# Patient Record
Sex: Male | Born: 1935 | Race: White | Hispanic: No | Smoking: Former smoker
Health system: Southern US, Community
[De-identification: ages and names within clinical notes are randomized; demographics above are authoritative.]

## PROBLEM LIST (undated history)

## (undated) DIAGNOSIS — T7840XA Allergy, unspecified, initial encounter: Secondary | ICD-10-CM

## (undated) DIAGNOSIS — E7800 Pure hypercholesterolemia, unspecified: Secondary | ICD-10-CM

## (undated) DIAGNOSIS — C801 Malignant (primary) neoplasm, unspecified: Secondary | ICD-10-CM

## (undated) DIAGNOSIS — C4490 Unspecified malignant neoplasm of skin, unspecified: Secondary | ICD-10-CM

## (undated) DIAGNOSIS — M1990 Unspecified osteoarthritis, unspecified site: Secondary | ICD-10-CM

## (undated) DIAGNOSIS — I10 Essential (primary) hypertension: Secondary | ICD-10-CM

## (undated) DIAGNOSIS — E119 Type 2 diabetes mellitus without complications: Secondary | ICD-10-CM

## (undated) HISTORY — DX: Unspecified malignant neoplasm of skin, unspecified: C44.90

## (undated) HISTORY — DX: Allergy, unspecified, initial encounter: T78.40XA

## (undated) HISTORY — DX: Essential (primary) hypertension: I10

## (undated) HISTORY — PX: ENUCLEATION: SHX628

---
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
## 2003-11-06 ENCOUNTER — Other Ambulatory Visit: Admission: RE | Admit: 2003-11-06 | Discharge: 2003-11-06 | Payer: Self-pay | Admitting: Dermatology

## 2005-02-23 ENCOUNTER — Other Ambulatory Visit: Admission: RE | Admit: 2005-02-23 | Discharge: 2005-02-23 | Payer: Self-pay | Admitting: Dermatology

## 2007-05-30 ENCOUNTER — Ambulatory Visit (HOSPITAL_COMMUNITY): Admission: RE | Admit: 2007-05-30 | Discharge: 2007-05-30 | Payer: Self-pay | Admitting: Internal Medicine

## 2007-05-30 ENCOUNTER — Ambulatory Visit: Payer: Self-pay | Admitting: Internal Medicine

## 2007-11-17 ENCOUNTER — Emergency Department (HOSPITAL_COMMUNITY): Admission: EM | Admit: 2007-11-17 | Discharge: 2007-11-17 | Payer: Self-pay | Admitting: Emergency Medicine

## 2007-11-19 ENCOUNTER — Emergency Department (HOSPITAL_COMMUNITY): Admission: EM | Admit: 2007-11-19 | Discharge: 2007-11-19 | Payer: Self-pay | Admitting: Emergency Medicine

## 2009-04-03 ENCOUNTER — Emergency Department (HOSPITAL_COMMUNITY): Admission: EM | Admit: 2009-04-03 | Discharge: 2009-04-03 | Payer: Self-pay | Admitting: Emergency Medicine

## 2010-09-28 NOTE — Op Note (Signed)
NAME:  Cody Palmer, Cody Palmer               ACCOUNT NO.:  [id number]   MEDICAL RECORD NO.:  [id number]          PATIENT TYPE:  AMB   LOCATION:  DAY                           FACILITY:  [facility]   PHYSICIAN:  R. Roetta Sessions, M.D. DATE OF BIRTH:  26-Jul-1932   DATE OF PROCEDURE:  05/30/2007  DATE OF DISCHARGE:                                PROCEDURE NOTE   OPERATION/PROCEDURE:  Screening ileocolonoscopy.   INDICATIONS FOR PROCEDURE:  This 74-year gentleman is devoid of any  lower GI tract symptoms, sent over at the courtesy Dr. Janna Arch for  cancer screening via colonoscopy.  Mr.  [name] has never had his lower  GI tract imaged.  There is no family history colorectal neoplasia.  Colonoscopy is now being done as screening maneuver.  Potential risks,  benefits and alternatives and limitations have been reviewed, questions  answered.  He is agreeable.  Documentation in the medical record.   PROCEDURE NOTE:  Oxygen saturation, blood pressure, pulse and  respirations are monitored throughout the entire procedure.  Conscious  sedation with  Versed 6 mg IV, Demerol 125 mg IV in divided doses.  Instrument was the Pentax video chip system.   FINDINGS:  Digital rectal exam revealed no abnormalities. Prep was  adequate.  Colonic mucosa was surveyed in the rectosigmoid junction  through the left, transverse, right colon to the appendiceal orifice,  ileocecal valve and cecum.  These structures well seen.   For the record, the terminal ileum _________ cm from this level.  Scope  was slowly cautiously withdrawn.  All previous mentioned mucosal  surfaces were again seen.  The patient was noted have left-sided  transverse diverticula; ___________  terminal ileum and mucosa appeared  entirely normal.  Scope was pulled down in the rectum where the rectal  mucosa including retroflex view of the anal verge demonstrated no  abnormalities.  The patient tolerated the procedure well.  Was reactive  after  endoscopy.   IMPRESSION:  Normal rectum, left side, transverse diverticula.  The rest  of the colonic appeared normal.  Terminal mucosa appeared normal.   RECOMMENDATIONS:  1. Diverticulosis literature provided Mr. Thackston.  2. Repeat colonoscopy in 10 years.      Jonathon Bellows, M.D.  Electronically Signed     RMR/MEDQ  D:  05/30/2007  T:  05/30/2007  Job:  [id number]   cc:   Melvyn Novas, MD  Fax: [phone]

## 2012-12-13 ENCOUNTER — Encounter: Payer: Self-pay | Admitting: Orthopedic Surgery

## 2012-12-13 ENCOUNTER — Ambulatory Visit (INDEPENDENT_AMBULATORY_CARE_PROVIDER_SITE_OTHER): Payer: Medicare Other | Admitting: Orthopedic Surgery

## 2012-12-13 ENCOUNTER — Ambulatory Visit (INDEPENDENT_AMBULATORY_CARE_PROVIDER_SITE_OTHER): Payer: Medicare Other

## 2012-12-13 VITALS — BP 141/75 | Ht 70.0 in | Wt 210.0 lb

## 2012-12-13 DIAGNOSIS — M25569 Pain in unspecified knee: Secondary | ICD-10-CM

## 2012-12-13 DIAGNOSIS — M25561 Pain in right knee: Secondary | ICD-10-CM

## 2012-12-13 DIAGNOSIS — M1710 Unilateral primary osteoarthritis, unspecified knee: Secondary | ICD-10-CM

## 2012-12-13 DIAGNOSIS — M179 Osteoarthritis of knee, unspecified: Secondary | ICD-10-CM | POA: Insufficient documentation

## 2012-12-13 NOTE — Patient Instructions (Addendum)
Wear brace for walking   Apply ice 3 x a day for 30 minutes   Use crutch as needed

## 2012-12-13 NOTE — Progress Notes (Signed)
Patient ID: Cody Palmer, male   DOB: 1932/11/26, 79 y.o.   MRN: [id number] Chief Complaint  Patient presents with  . Knee Pain    Right knee pain, no injury.    Patient history we have a 79 year old male with hypertension who presents with long-standing right knee pain but increasing right knee pain since Friday with progressive increase in dull aching 6/10 constant knee pain causing him to not be able to put weight on his leg. He uses a cane but his pain increases to the point where it's unbearable and has lost function he does report some numbness in that right lateral knee joint with swelling of the joint. He was on ibuprofen switch to Naprosyn did well until July 25. 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 His review of systems is negative for weight loss chest pain blurred vision shortness of breath heartburn frequency skin changes numbness nervousness first. He does complain of easy bleeding, seasonal allergies stiffness and instability of the joint.  History of hypertension  History of to eye surgery secondary to traumatic eye injury  Pharmacy [facility]  Physician Dr. Ocie Bob [facility]  Medications amlodipine 10 mg a day Losartan 100-25 mg a day Labetalol 200 mg twice a day Crestor 10 mg a day Aspirin 81 mg a day Naproxen 500 mg twice a day  Family history arthritis cancer and diabetes  Social history he is currently widowed Smoking history he does not smoke alcohol use none Street drug use none  BP 141/75  Ht 5\' 10"  (1.778 m)  Wt 210 lb (95.255 kg)  BMI 30.13 kg/m2 General appearance is normal, the patient is alert and oriented x3 with normal mood and affect. The patient is ambulating with a crutch right hand severe limp severe difficulty weightbearing  Nutritional status is normal body habitus is large grooming is excellent  Peripheral vascular system no swelling no pitting edema pulses are normal temperature is normal no tenderness  Lymphatic  system groin nodes negative  Upper extremity exam  The right and left upper extremity:   Inspection and palpation revealed no abnormalities in the upper extremities.   Range of motion is full without contracture.  Motor exam is normal with grade 5 strength.  The joints are fully reduced without subluxation.  There is no atrophy or tremor and muscle tone is normal.  All joints are stable.  Left lower extremity is in varus very minimal joint line tenderness however. Flexion 115 stability normal mild flexion contracture strength normal muscle tone normal  Right knee varus lateral tenderness just over the fibula iliotibial band nontender painful crepitance no pain with varus and valgus stress no laxity muscle strength and tone normal no medial joint line tenderness  Skin all 4 extremities normal sensory exam normal orientation times person place normal mood and affect normal coordination test cannot be performed reflexes are equal stretch test negative  X-rays severe varus arthritis  I think he has some tendinitis/bursitis on the lateral side of his knee I injected him and put him in a hinged knee brace this may be stretching lateral compartment secondary to severe varus and thrust  Recommend recheck in a week to determine if he needs knee replacement  Procedure note inject the bursal right lateral knee Medications Depo-Medrol 40 mg once cc 1% lidocaine 3 cc Alcohol Ethyl chloride  Details: After verbal consent and timeout to confirm site alcohol and ethyl chloride were used to prepare the skin and then, the lateral  portion of the knee was injected with the above combination of medications. No complications

## 2012-12-20 ENCOUNTER — Ambulatory Visit (INDEPENDENT_AMBULATORY_CARE_PROVIDER_SITE_OTHER): Payer: Medicare Other | Admitting: Orthopedic Surgery

## 2012-12-20 ENCOUNTER — Encounter: Payer: Self-pay | Admitting: Orthopedic Surgery

## 2012-12-20 VITALS — BP 124/61 | Ht 70.0 in | Wt 210.0 lb

## 2012-12-20 DIAGNOSIS — M1710 Unilateral primary osteoarthritis, unspecified knee: Secondary | ICD-10-CM

## 2012-12-20 NOTE — Progress Notes (Signed)
Patient ID: Cody Palmer, male   DOB: 1933-03-11, 79 y.o.   MRN: [id number] Chief Complaint  Patient presents with  . Follow-up    1 week follow up s/p injection    80 year old male with lateral knee pain status post injection of the lateral collateral ligament insertion and iliotibial band. The hasn't changed much he may be a little bit better he is ambulating with a hinged knee brace and a crutch  I watched him walk without the crutch he has a severe varus thrust and a varus knee. His knee joint really doesn't hurt that much his main problem is that he can't walk well and he can't get back to his normal activities  Denies radicular symptoms no catching locking or giving way  General appearance is normal, the patient is alert and oriented x3 with normal mood and affect. BP 124/61  Ht 5\' 10"  (1.778 m)  Wt 210 lb (95.255 kg)  BMI 30.13 kg/m2 Again ambulation show severe varus thrust in the has a severe varus knee with competent competent lateral collateral ligament medial joint line tenderness no effusion normal strength normal skin  He would like to go to the total joint class before making a decision. He is a medical appointment next week so he does preop appointment done at that time  Call back to confirm right total knee arthroplasty

## 2012-12-24 ENCOUNTER — Ambulatory Visit: Payer: Medicare Other | Admitting: Orthopedic Surgery

## 2012-12-25 ENCOUNTER — Telehealth: Payer: Self-pay | Admitting: Orthopedic Surgery

## 2012-12-25 ENCOUNTER — Encounter: Payer: Self-pay | Admitting: *Deleted

## 2012-12-25 NOTE — Telephone Encounter (Signed)
Called and faxed notes to primary care, Dr. Janna Arch, fax# [phone], ph# [phone] - included request for medical clearance; patient and daughter Cody Palmer aware of status.  Patient's daughter relates patient had seen Dr. Janna Arch, and was told verbally "okay for surgery."  Would like to have surgery as soon as possible, 12/31/12? If available.  Patient ph# [phone] [facility]), daughter work # [phone].

## 2012-12-27 ENCOUNTER — Telehealth: Payer: Self-pay | Admitting: Orthopedic Surgery

## 2012-12-27 NOTE — Telephone Encounter (Signed)
Cody Palmer at Dr. Otilio Saber office said Cody Palmer has an appointment next Wednesday to see Dr. Janna Arch.  She will fax over surgery information after this visit.

## 2013-01-03 ENCOUNTER — Telehealth: Payer: Self-pay | Admitting: Orthopedic Surgery

## 2013-01-03 ENCOUNTER — Other Ambulatory Visit: Payer: Self-pay | Admitting: *Deleted

## 2013-01-03 NOTE — Telephone Encounter (Signed)
Regarding in-patient/admit surgery scheduled 01/11/13, CPT [phone], ICD9 code 715.96, 715.16 - Contacted insurer [employer] by phone, [phone], reached automated voice message indicating to: Fax# [phone] for submission of clinicals to nurse reviewer, pre-authorization department

## 2013-01-04 NOTE — Telephone Encounter (Addendum)
01/04/13 Call received back from [facility] Medicare/[employer], in response to my phone message and fax, acknowledged receipt; case pending nurse review.  We will be further notified via fax and/or phone of pre-authorization.  * Received pre-authorization following this call, per Jonathon Bellows, #[id number], for up to 7 day stay, for surgery noted (CPT [phone]).  If additional day(s) needed, clinicals would be required from hospital.

## 2013-01-07 ENCOUNTER — Encounter (HOSPITAL_COMMUNITY): Payer: Self-pay

## 2013-01-07 ENCOUNTER — Encounter (HOSPITAL_COMMUNITY): Payer: Self-pay | Admitting: Pharmacy Technician

## 2013-01-07 ENCOUNTER — Encounter (HOSPITAL_COMMUNITY)
Admission: RE | Admit: 2013-01-07 | Discharge: 2013-01-07 | Disposition: A | Discharge status: Home / Self Care | Payer: Medicare Other | Source: Ambulatory Visit | Attending: Orthopedic Surgery | Admitting: Orthopedic Surgery

## 2013-01-07 DIAGNOSIS — Z01812 Encounter for preprocedural laboratory examination: Secondary | ICD-10-CM | POA: Insufficient documentation

## 2013-01-07 DIAGNOSIS — Z01810 Encounter for preprocedural cardiovascular examination: Secondary | ICD-10-CM | POA: Insufficient documentation

## 2013-01-07 HISTORY — DX: Unspecified osteoarthritis, unspecified site: M19.90

## 2013-01-07 HISTORY — DX: Pure hypercholesterolemia, unspecified: E78.00

## 2013-01-07 LAB — BASIC METABOLIC PANEL
CO2: 32 mEq/L (ref 19–32)
Calcium: 10.6 mg/dL — ABNORMAL HIGH (ref 8.4–10.5)
Chloride: 100 mEq/L (ref 96–112)
GFR calc Af Amer: 70 mL/min — ABNORMAL LOW (ref >90)
Sodium: 140 mEq/L (ref 135–145)

## 2013-01-07 LAB — ABO/RH: ABO/RH(D): O POS

## 2013-01-07 LAB — CBC WITH DIFFERENTIAL/PLATELET
Basophils Absolute: 0 10*3/uL (ref 0.0–0.1)
Basophils Relative: 0 % (ref 0–1)
Lymphocytes Relative: 15 % (ref 12–46)
Neutro Abs: 7.3 10*3/uL (ref 1.7–7.7)
Platelets: 251 10*3/uL (ref 150–400)
RDW: 13.7 % (ref 11.5–15.5)
WBC: 10.2 10*3/uL (ref 4.0–10.5)

## 2013-01-07 LAB — SURGICAL PCR SCREEN: MRSA, PCR: NEGATIVE

## 2013-01-07 LAB — PREPARE RBC (CROSSMATCH)

## 2013-01-07 NOTE — Patient Instructions (Addendum)
Cody Palmer  01/07/2013   Your procedure is scheduled on:   01/11/2013   Report to [facility] at  615  AM.  Call this number if you have problems the morning of surgery: [phone]   Remember:   Do not eat food or drink liquids after midnight.   Take these medicines the morning of surgery with A SIP OF WATER: norvasc, labetolol, hyzaar   Do not wear jewelry, make-up or nail polish.  Do not wear lotions, powders, or perfumes.   Do not shave 48 hours prior to surgery. Men may shave face and neck.  Do not bring valuables to the hospital.  [facility] is not responsible  for any belongings or valuables.  Contacts, dentures or bridgework may not be worn into surgery.  Leave suitcase in the car. After surgery it may be brought to your room.  For patients admitted to the hospital, checkout time is 11:00 AM the day of discharge.   Patients discharged the day of surgery will not be allowed to drive home.  Name and phone number of your driver: family  Special Instructions: Shower using CHG 2 nights before surgery and the night before surgery.  If you shower the day of surgery use CHG.  Use special wash - you have one bottle of CHG for all showers.  You should use approximately 1/3 of the bottle for each shower.   Please read over the following fact sheets that you were given: Pain Booklet, Coughing and Deep Breathing, Surgical Site Infection Prevention, Anesthesia Post-op Instructions and Care and Recovery After Surgery Total Knee Replacement Total knee replacement is a procedure to replace your knee joint with an artificial knee joint (prosthetic knee joint). The purpose of this surgery is to reduce pain and improve your knee function. LET YOUR CAREGIVER KNOW ABOUT:   Any allergies you have.  Any medicines you are taking, including vitamins, herbs, eyedrops, over-the-counter medicines, and creams.  Any problems you have had with the use of anesthetics.  Family history of problems  with the use of anesthetics.  Any blood disorders you have, including bleeding problems or clotting problems.  Previous surgeries you have had. RISKS AND COMPLICATIONS  Generally, total knee replacement is a safe procedure. However, as with any surgical procedure, complications can occur. Possible complications associated with total knee replacement include:  Loss of range of motion of the knee or instability.  Loosening of the prosthesis.  Infection.  Persistent pain. BEFORE THE PROCEDURE   Your caregiver will instruct you when you need to stop eating and drinking.  Ask your caregiver if you need to change or stop any regular medicines. PROCEDURE  Just before the procedure you will receive medicine that will make you drowsy (sedative). This will be given through a tube that is inserted into one of your veins (intravenous [IV] tube). Then you will either receive medicine to block pain from the waist down through your legs (spinal block) or medicine to also receive medicine to make you fall asleep (general anesthetic). You may also receive medicine to block feeling in your leg (nerve block) to help ease pain after surgery. An incision will be made in your knee. Your surgeon will take out any damaged cartilage and bone by sawing off the damaged surfaces. Then the surgeon will put a new metal liner over the sawed off portion of your thigh bone (femur) and a plastic liner over the sawed off portion of one of the  bones of your lower leg (tibia). This is to restore alignment and function to your knee. A plastic piece is often used to restore the surface of your knee cap. AFTER THE PROCEDURE  You will be taken to the recovery area. You may have drainage tubes to drain excess fluid from your knee. These tubes attach to a device that removes these fluids. Once you are awake, stable, and taking fluids well, you will be taken to your hospital room. You will receive physical therapy as prescribed by your  caregiver. The length of your stay in the hospital after a knee replacement is 2 4 days. Your surgeon may recommend that you spend time (usually an additional 10 14 days) in an extended-care facility to help you begin walking again and improve your range of motion before you go home. You may also be prescribed blood-thinning medicine to decrease your risk of developing blood clots in your leg. Document Released: 08/08/2000 Document Revised: 11/01/2011 Document Reviewed: 06/12/2011 [facility] Patient Information 2014 [place], [place]. PATIENT INSTRUCTIONS POST-ANESTHESIA  IMMEDIATELY FOLLOWING SURGERY:  Do not drive or operate machinery for the first twenty four hours after surgery.  Do not make any important decisions for twenty four hours after surgery or while taking narcotic pain medications or sedatives.  If you develop intractable nausea and vomiting or a severe headache please notify your doctor immediately.  FOLLOW-UP:  Please make an appointment with your surgeon as instructed. You do not need to follow up with anesthesia unless specifically instructed to do so.  WOUND CARE INSTRUCTIONS (if applicable):  Keep a dry clean dressing on the anesthesia/puncture wound site if there is drainage.  Once the wound has quit draining you may leave it open to air.  Generally you should leave the bandage intact for twenty four hours unless there is drainage.  If the epidural site drains for more than 36-48 hours please call the anesthesia department.  QUESTIONS?:  Please feel free to call your physician or the hospital operator if you have any questions, and they will be happy to assist you.

## 2013-01-10 NOTE — H&P (Signed)
  Chief Complaint   Patient presents with   .  Knee Pain       Right knee pain, no injury.     Patient history we have a 79 year old male with hypertension who presents with long-standing right knee pain but increasing right knee pain since Friday with progressive increase in dull aching 6/10 constant knee pain causing him to not be able to put weight on his leg. He uses a cane but his pain increases to the point where it's unbearable and has lost function he does report some numbness in that right lateral knee joint with swelling of the joint. He was on ibuprofen switch to Naprosyn did well until July 25. 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 His review of systems is negative for weight loss chest pain blurred vision shortness of breath heartburn frequency skin changes numbness nervousness first. He does complain of easy bleeding, seasonal allergies stiffness and instability of the joint.  We did give him a cortisone injection and he still continued to have symptoms and want to proceed with knee replacement surgery. Informed consent process involved an explanation of the risks and benefits of this procedure and alternative treatments including nonoperative care   History of hypertension  History of to eye surgery secondary to traumatic eye injury  Pharmacy [facility] pharmacy  Physician Dr. Ocie Bob [facility]  Medications amlodipine 10 mg a day Losartan 100-25 mg a day Labetalol 200 mg twice a day Crestor 10 mg a day Aspirin 81 mg a day Naproxen 500 mg twice a day  Family history arthritis cancer and diabetes  Social history he is currently widowed Smoking history he does not smoke alcohol use none Street drug use none  BP 141/75  Ht 5\' 10"  (1.778 m)  Wt 210 lb (95.255 kg)  BMI 30.13 kg/m2 General appearance is normal, the patient is alert and oriented x3 with normal mood and affect. The patient is ambulating with a crutch right hand severe limp severe difficulty  weightbearing  Nutritional status is normal body habitus is large grooming is excellent  Peripheral vascular system no swelling no pitting edema pulses are normal temperature is normal no tenderness  Lymphatic system groin nodes negative  Upper extremity exam  The right and left upper extremity:     Inspection and palpation revealed no abnormalities in the upper extremities.    Range of motion is full without contracture.  Motor exam is normal with grade 5 strength.  The joints are fully reduced without subluxation.  There is no atrophy or tremor and muscle tone is normal.  All joints are stable.  Left lower extremity is in varus very minimal joint line tenderness however. Flexion 115 stability normal mild flexion contracture strength normal muscle tone normal  Right knee varus lateral tenderness just over the fibula iliotibial band nontender painful crepitance no pain with varus and valgus stress no laxity muscle strength and tone normal no medial joint line tenderness  Skin all 4 extremities normal sensory exam normal orientation times person place normal mood and affect normal coordination test cannot be performed reflexes are equal stretch test negative  X-rays severe varus arthritis  Osteoarthritis right knee  Right total knee

## 2013-01-11 ENCOUNTER — Encounter (HOSPITAL_COMMUNITY): Payer: Self-pay | Admitting: Anesthesiology

## 2013-01-11 ENCOUNTER — Encounter (HOSPITAL_COMMUNITY)
Admission: RE | Disposition: A | Discharge status: Home Health | Payer: Self-pay | Source: Ambulatory Visit | Attending: Orthopedic Surgery

## 2013-01-11 ENCOUNTER — Inpatient Hospital Stay (HOSPITAL_COMMUNITY): Payer: Medicare Other | Admitting: Anesthesiology

## 2013-01-11 ENCOUNTER — Encounter (HOSPITAL_COMMUNITY): Payer: Self-pay | Admitting: *Deleted

## 2013-01-11 ENCOUNTER — Inpatient Hospital Stay (HOSPITAL_COMMUNITY): Payer: Medicare Other

## 2013-01-11 ENCOUNTER — Inpatient Hospital Stay (HOSPITAL_COMMUNITY)
Admission: RE | Admit: 2013-01-11 | Discharge: 2013-01-14 | DRG: 470 | Disposition: A | Discharge status: Home Health | Payer: Medicare Other | Source: Ambulatory Visit | Attending: Orthopedic Surgery | Admitting: Orthopedic Surgery

## 2013-01-11 DIAGNOSIS — I10 Essential (primary) hypertension: Secondary | ICD-10-CM | POA: Diagnosis present

## 2013-01-11 DIAGNOSIS — Z833 Family history of diabetes mellitus: Secondary | ICD-10-CM

## 2013-01-11 DIAGNOSIS — M1710 Unilateral primary osteoarthritis, unspecified knee: Principal | ICD-10-CM | POA: Diagnosis present

## 2013-01-11 HISTORY — PX: TOTAL KNEE ARTHROPLASTY: SHX125

## 2013-01-11 SURGERY — ARTHROPLASTY, KNEE, TOTAL
Anesthesia: Spinal | Site: Knee | Laterality: Right | Wound class: Clean

## 2013-01-11 MED ORDER — BUPIVACAINE-EPINEPHRINE (PF) 0.5% -1:200000 IJ SOLN
INTRAMUSCULAR | Status: DC | PRN
  Administered 2013-01-11: 90 mL

## 2013-01-11 MED ORDER — HYDROCODONE-ACETAMINOPHEN 7.5-325 MG PO TABS
1.0000 | ORAL_TABLET | ORAL | Status: DC
  Administered 2013-01-11 – 2013-01-14 (×15): 1 via ORAL
  Filled 2013-01-11 (×15): qty 1

## 2013-01-11 MED ORDER — CEFAZOLIN SODIUM-DEXTROSE 2-3 GM-% IV SOLR
INTRAVENOUS | Status: AC
  Filled 2013-01-11: qty 50

## 2013-01-11 MED ORDER — PREGABALIN 50 MG PO CAPS
50.0000 mg | ORAL_CAPSULE | Freq: Once | ORAL | Status: DC
  Administered 2013-01-11: 50 mg via ORAL

## 2013-01-11 MED ORDER — FENTANYL CITRATE 0.05 MG/ML IJ SOLN
INTRAMUSCULAR | Status: AC
  Filled 2013-01-11: qty 2

## 2013-01-11 MED ORDER — ONDANSETRON HCL 4 MG PO TABS: 4.0000 mg | ORAL_TABLET | Freq: Four times a day (QID) | ORAL | Status: DC | PRN

## 2013-01-11 MED ORDER — EPINEPHRINE HCL 0.1 MG/ML IJ SOSY
PREFILLED_SYRINGE | INTRAMUSCULAR | Status: DC | PRN
  Administered 2013-01-11: 1 ug via INTRATRACHEAL

## 2013-01-11 MED ORDER — LACTATED RINGERS IV SOLN
INTRAVENOUS | Status: DC
  Administered 2013-01-11: 07:00:00 via INTRAVENOUS
  Administered 2013-01-11: 500 mL via INTRAVENOUS

## 2013-01-11 MED ORDER — FENTANYL CITRATE 0.05 MG/ML IJ SOLN
25.0000 ug | INTRAMUSCULAR | Status: DC | PRN
  Administered 2013-01-11 (×2): 50 ug via INTRAVENOUS

## 2013-01-11 MED ORDER — CELECOXIB 100 MG PO CAPS
200.0000 mg | ORAL_CAPSULE | Freq: Two times a day (BID) | ORAL | Status: DC
  Administered 2013-01-11 – 2013-01-14 (×6): 200 mg via ORAL
  Filled 2013-01-11 (×6): qty 2

## 2013-01-11 MED ORDER — MIDAZOLAM HCL 5 MG/5ML IJ SOLN
INTRAMUSCULAR | Status: DC | PRN
  Administered 2013-01-11 (×4): 0.5 mg via INTRAVENOUS

## 2013-01-11 MED ORDER — HYDROCHLOROTHIAZIDE 25 MG PO TABS
25.0000 mg | ORAL_TABLET | Freq: Every day | ORAL | Status: DC
  Administered 2013-01-12 – 2013-01-14 (×3): 25 mg via ORAL
  Filled 2013-01-11 (×3): qty 1

## 2013-01-11 MED ORDER — CHLORHEXIDINE GLUCONATE 4 % EX LIQD: 60.0000 mL | Freq: Once | CUTANEOUS | Status: DC

## 2013-01-11 MED ORDER — OXYCODONE HCL 5 MG PO TABS
5.0000 mg | ORAL_TABLET | Freq: Once | ORAL | Status: DC
  Administered 2013-01-11: 5 mg via ORAL

## 2013-01-11 MED ORDER — FENTANYL CITRATE 0.05 MG/ML IJ SOLN
INTRAMUSCULAR | Status: DC | PRN
  Administered 2013-01-11: 12.5 ug via INTRAVENOUS
  Administered 2013-01-11: 25 ug via INTRAVENOUS
  Administered 2013-01-11 (×2): 12.5 ug via INTRAVENOUS

## 2013-01-11 MED ORDER — ATORVASTATIN CALCIUM 10 MG PO TABS: 10.0000 mg | ORAL_TABLET | Freq: Every day | ORAL | Status: DC

## 2013-01-11 MED ORDER — LABETALOL HCL 200 MG PO TABS
200.0000 mg | ORAL_TABLET | Freq: Two times a day (BID) | ORAL | Status: DC
  Administered 2013-01-11 – 2013-01-14 (×6): 200 mg via ORAL
  Filled 2013-01-11 (×6): qty 1

## 2013-01-11 MED ORDER — OXYCODONE HCL 5 MG PO TABS
ORAL_TABLET | ORAL | Status: AC
  Filled 2013-01-11: qty 1

## 2013-01-11 MED ORDER — CEFAZOLIN SODIUM-DEXTROSE 2-3 GM-% IV SOLR
2.0000 g | INTRAVENOUS | Status: AC
  Administered 2013-01-11: 2 g via INTRAVENOUS

## 2013-01-11 MED ORDER — ASPIRIN EC 325 MG PO TBEC
325.0000 mg | DELAYED_RELEASE_TABLET | Freq: Two times a day (BID) | ORAL | Status: DC
  Administered 2013-01-12 – 2013-01-14 (×5): 325 mg via ORAL
  Filled 2013-01-11 (×5): qty 1

## 2013-01-11 MED ORDER — METHOCARBAMOL 500 MG PO TABS: 500.0000 mg | ORAL_TABLET | Freq: Four times a day (QID) | ORAL | Status: DC | PRN

## 2013-01-11 MED ORDER — PROPOFOL 10 MG/ML IV EMUL
INTRAVENOUS | Status: AC
  Filled 2013-01-11: qty 20

## 2013-01-11 MED ORDER — BUPIVACAINE IN DEXTROSE 0.75-8.25 % IT SOLN
INTRATHECAL | Status: AC
  Filled 2013-01-11: qty 2

## 2013-01-11 MED ORDER — CELECOXIB 100 MG PO CAPS
400.0000 mg | ORAL_CAPSULE | Freq: Once | ORAL | Status: DC
  Administered 2013-01-11: 400 mg via ORAL

## 2013-01-11 MED ORDER — SODIUM CHLORIDE 0.9 % IR SOLN
Status: DC | PRN
  Administered 2013-01-11: 3000 mL

## 2013-01-11 MED ORDER — 0.9 % SODIUM CHLORIDE (POUR BTL) OPTIME
TOPICAL | Status: DC | PRN
  Administered 2013-01-11: 1000 mL

## 2013-01-11 MED ORDER — BUPIVACAINE IN DEXTROSE 0.75-8.25 % IT SOLN
INTRATHECAL | Status: DC | PRN
  Administered 2013-01-11: 15 mg via INTRATHECAL

## 2013-01-11 MED ORDER — ACETAMINOPHEN 325 MG PO TABS: 650.0000 mg | ORAL_TABLET | Freq: Four times a day (QID) | ORAL | Status: DC | PRN

## 2013-01-11 MED ORDER — SODIUM CHLORIDE 0.9 % IV SOLN
INTRAVENOUS | Status: DC
  Administered 2013-01-11: 100 mL/h via INTRAVENOUS
  Administered 2013-01-12: 14:00:00 via INTRAVENOUS

## 2013-01-11 MED ORDER — AMLODIPINE BESYLATE 5 MG PO TABS
10.0000 mg | ORAL_TABLET | Freq: Every day | ORAL | Status: DC
Start: 2013-01-11 — End: 2013-01-14
  Administered 2013-01-12 – 2013-01-14 (×3): 10 mg via ORAL
  Filled 2013-01-11 (×3): qty 2

## 2013-01-11 MED ORDER — MIDAZOLAM HCL 2 MG/2ML IJ SOLN
INTRAMUSCULAR | Status: AC
  Filled 2013-01-11: qty 2

## 2013-01-11 MED ORDER — FENTANYL CITRATE 0.05 MG/ML IJ SOLN
25.0000 ug | INTRAMUSCULAR | Status: DC
  Administered 2013-01-11: 25 ug via INTRAVENOUS

## 2013-01-11 MED ORDER — BUPIVACAINE-EPINEPHRINE PF 0.5-1:200000 % IJ SOLN
INTRAMUSCULAR | Status: AC
  Filled 2013-01-11: qty 20

## 2013-01-11 MED ORDER — ONDANSETRON HCL 4 MG/2ML IJ SOLN: 4.0000 mg | Freq: Once | INTRAMUSCULAR | Status: DC | PRN

## 2013-01-11 MED ORDER — DEXTROSE 5 % IV SOLN
500.0000 mg | Freq: Four times a day (QID) | INTRAVENOUS | Status: DC | PRN
  Filled 2013-01-11: qty 5

## 2013-01-11 MED ORDER — CELECOXIB 100 MG PO CAPS
ORAL_CAPSULE | ORAL | Status: AC
  Filled 2013-01-11: qty 4

## 2013-01-11 MED ORDER — METHOCARBAMOL 100 MG/ML IJ SOLN
500.0000 mg | Freq: Once | INTRAVENOUS | Status: DC
  Administered 2013-01-11: 500 mg via INTRAVENOUS
  Filled 2013-01-11: qty 5

## 2013-01-11 MED ORDER — ALUM & MAG HYDROXIDE-SIMETH 200-200-20 MG/5ML PO SUSP: 30.0000 mL | ORAL | Status: DC | PRN

## 2013-01-11 MED ORDER — LOSARTAN POTASSIUM 50 MG PO TABS
100.0000 mg | ORAL_TABLET | Freq: Every day | ORAL | Status: DC
  Administered 2013-01-12 – 2013-01-14 (×3): 100 mg via ORAL
  Filled 2013-01-11 (×3): qty 2

## 2013-01-11 MED ORDER — MENTHOL 3 MG MT LOZG: 1.0000 | LOZENGE | OROMUCOSAL | Status: DC | PRN

## 2013-01-11 MED ORDER — PROPOFOL INFUSION 10 MG/ML OPTIME
INTRAVENOUS | Status: DC | PRN
  Administered 2013-01-11: 100 ug/kg/min via INTRAVENOUS
  Administered 2013-01-11: 10 ug/kg/min via INTRAVENOUS

## 2013-01-11 MED ORDER — CEFAZOLIN SODIUM-DEXTROSE 2-3 GM-% IV SOLR
2.0000 g | Freq: Four times a day (QID) | INTRAVENOUS | Status: AC
  Administered 2013-01-11 – 2013-01-12 (×3): 2 g via INTRAVENOUS
  Filled 2013-01-11 (×3): qty 50

## 2013-01-11 MED ORDER — ONDANSETRON HCL 4 MG/2ML IJ SOLN
4.0000 mg | Freq: Once | INTRAMUSCULAR | Status: AC
  Administered 2013-01-11: 4 mg via INTRAVENOUS

## 2013-01-11 MED ORDER — METOCLOPRAMIDE HCL 10 MG PO TABS: 5.0000 mg | ORAL_TABLET | Freq: Three times a day (TID) | ORAL | Status: DC | PRN

## 2013-01-11 MED ORDER — FENTANYL CITRATE 0.05 MG/ML IJ SOLN
INTRAMUSCULAR | Status: DC | PRN
  Administered 2013-01-11: 12.5 ug via INTRAVENOUS

## 2013-01-11 MED ORDER — ACETAMINOPHEN 650 MG RE SUPP: 650.0000 mg | Freq: Four times a day (QID) | RECTAL | Status: DC | PRN

## 2013-01-11 MED ORDER — ONDANSETRON HCL 4 MG/2ML IJ SOLN
INTRAMUSCULAR | Status: AC
  Filled 2013-01-11: qty 2

## 2013-01-11 MED ORDER — OXYCODONE HCL 5 MG PO TABS
5.0000 mg | ORAL_TABLET | ORAL | Status: DC | PRN
  Administered 2013-01-12: 5 mg via ORAL
  Filled 2013-01-11: qty 2

## 2013-01-11 MED ORDER — PHENOL 1.4 % MT LIQD: 1.0000 | OROMUCOSAL | Status: DC | PRN

## 2013-01-11 MED ORDER — METOCLOPRAMIDE HCL 5 MG/ML IJ SOLN: 5.0000 mg | Freq: Three times a day (TID) | INTRAMUSCULAR | Status: DC | PRN

## 2013-01-11 MED ORDER — LIDOCAINE HCL (PF) 1 % IJ SOLN
INTRAMUSCULAR | Status: AC
  Filled 2013-01-11: qty 5

## 2013-01-11 MED ORDER — POLYETHYLENE GLYCOL 3350 17 G PO PACK
17.0000 g | PACK | Freq: Every day | ORAL | Status: DC
Start: 2013-01-11 — End: 2013-01-14
  Administered 2013-01-11 – 2013-01-14 (×5): 17 g via ORAL
  Filled 2013-01-11 (×4): qty 1

## 2013-01-11 MED ORDER — MIDAZOLAM HCL 2 MG/2ML IJ SOLN
1.0000 mg | INTRAMUSCULAR | Status: DC | PRN
  Administered 2013-01-11: 2 mg via INTRAVENOUS

## 2013-01-11 MED ORDER — BUPIVACAINE-EPINEPHRINE PF 0.5-1:200000 % IJ SOLN
INTRAMUSCULAR | Status: AC
  Filled 2013-01-11: qty 10

## 2013-01-11 MED ORDER — PREGABALIN 50 MG PO CAPS
ORAL_CAPSULE | ORAL | Status: AC
  Filled 2013-01-11: qty 1

## 2013-01-11 MED ORDER — ONDANSETRON HCL 4 MG/2ML IJ SOLN: 4.0000 mg | Freq: Four times a day (QID) | INTRAMUSCULAR | Status: DC | PRN

## 2013-01-11 MED ORDER — LIDOCAINE HCL (CARDIAC) 10 MG/ML IV SOLN
INTRAVENOUS | Status: DC | PRN
  Administered 2013-01-11: 10 mg via INTRAVENOUS

## 2013-01-11 MED ORDER — ROSUVASTATIN CALCIUM 20 MG PO TABS
10.0000 mg | ORAL_TABLET | Freq: Every day | ORAL | Status: DC
  Administered 2013-01-11 – 2013-01-13 (×3): 10 mg via ORAL
  Filled 2013-01-11 (×3): qty 1

## 2013-01-11 MED ORDER — LOSARTAN POTASSIUM-HCTZ 100-25 MG PO TABS: 1.0000 | ORAL_TABLET | Freq: Every day | ORAL | Status: DC

## 2013-01-11 MED ORDER — DIPHENHYDRAMINE HCL 12.5 MG/5ML PO ELIX: 12.5000 mg | ORAL_SOLUTION | ORAL | Status: DC | PRN

## 2013-01-11 MED ORDER — HYDROMORPHONE HCL PF 1 MG/ML IJ SOLN: 0.5000 mg | INTRAMUSCULAR | Status: DC | PRN

## 2013-01-11 SURGICAL SUPPLY — 69 items
BAG HAMPER (MISCELLANEOUS) ×2 IMPLANT
BANDAGE ESMARK 6X9 LF (GAUZE/BANDAGES/DRESSINGS) ×1 IMPLANT
BIT DRILL 3.2X128 (BIT) IMPLANT
BLADE HEX COATED 2.75 (ELECTRODE) ×2 IMPLANT
BLADE SAG 18X100X1.27 (BLADE) IMPLANT
BLADE SAGITTAL 25.0X1.27X90 (BLADE) ×2 IMPLANT
BLADE SAW SAG 90X13X1.27 (BLADE) ×2 IMPLANT
BNDG CMPR 9X6 STRL LF SNTH (GAUZE/BANDAGES/DRESSINGS) ×1
BNDG ESMARK 6X9 LF (GAUZE/BANDAGES/DRESSINGS) ×2
BOWL SMART MIX CTS (DISPOSABLE) IMPLANT
CAPT FB KNEE W/COCR XLINK ×1 IMPLANT
CEMENT HV SMART SET (Cement) ×4 IMPLANT
CLOTH BEACON ORANGE TIMEOUT ST (SAFETY) ×2 IMPLANT
COVER LIGHT HANDLE STERIS (MISCELLANEOUS) ×4 IMPLANT
COVER PROBE W GEL 5X96 (DRAPES) ×2 IMPLANT
CUFF TOURNIQUET SINGLE 34IN LL (TOURNIQUET CUFF) ×1 IMPLANT
CUFF TOURNIQUET SINGLE 44IN (TOURNIQUET CUFF) IMPLANT
DECANTER SPIKE VIAL GLASS SM (MISCELLANEOUS) ×5 IMPLANT
DRAPE BACK TABLE (DRAPES) ×2 IMPLANT
DRAPE EXTREMITY T 121X128X90 (DRAPE) ×2 IMPLANT
DRSG MEPILEX BORDER 4X12 (GAUZE/BANDAGES/DRESSINGS) ×2 IMPLANT
DURAPREP 26ML APPLICATOR (WOUND CARE) ×4 IMPLANT
ELECT REM PT RETURN 9FT ADLT (ELECTROSURGICAL) ×2
ELECTRODE REM PT RTRN 9FT ADLT (ELECTROSURGICAL) ×1 IMPLANT
EVACUATOR 3/16  PVC DRAIN (DRAIN) ×1
EVACUATOR 3/16 PVC DRAIN (DRAIN) ×1 IMPLANT
FACESHIELD LNG OPTICON STERILE (SAFETY) IMPLANT
GLOVE BIOGEL PI IND STRL 7.0 (GLOVE) IMPLANT
GLOVE BIOGEL PI INDICATOR 7.0 (GLOVE) ×2
GLOVE ECLIPSE 6.5 STRL STRAW (GLOVE) ×1 IMPLANT
GLOVE EXAM NITRILE MD LF STRL (GLOVE) ×3 IMPLANT
GLOVE OPTIFIT SS 6.5 STRL BRWN (GLOVE) ×1 IMPLANT
GLOVE OPTIFIT SS 8.0 STRL (GLOVE) ×2 IMPLANT
GLOVE SKINSENSE NS SZ8.0 LF (GLOVE) ×2
GLOVE SKINSENSE STRL SZ8.0 LF (GLOVE) ×2 IMPLANT
GLOVE SS N UNI LF 8.5 STRL (GLOVE) ×2 IMPLANT
GOWN STRL REIN XL XLG (GOWN DISPOSABLE) ×8 IMPLANT
HANDPIECE INTERPULSE COAX TIP (DISPOSABLE) ×2
HOOD W/PEELAWAY (MISCELLANEOUS) ×8 IMPLANT
INST SET MAJOR BONE (KITS) ×2 IMPLANT
IV NS IRRIG 3000ML ARTHROMATIC (IV SOLUTION) ×2 IMPLANT
KIT BLADEGUARD II DBL (SET/KITS/TRAYS/PACK) ×2 IMPLANT
KIT ROOM TURNOVER APOR (KITS) ×2 IMPLANT
MANIFOLD NEPTUNE II (INSTRUMENTS) ×2 IMPLANT
MARKER SKIN DUAL TIP RULER LAB (MISCELLANEOUS) ×2 IMPLANT
NDL HYPO 21X1.5 SAFETY (NEEDLE) ×1 IMPLANT
NDL HYPO 25X1 1.5 SAFETY (NEEDLE) ×1 IMPLANT
NEEDLE HYPO 21X1.5 SAFETY (NEEDLE) ×2 IMPLANT
NEEDLE HYPO 25X1 1.5 SAFETY (NEEDLE) ×2 IMPLANT
NS IRRIG 1000ML POUR BTL (IV SOLUTION) ×2 IMPLANT
PACK TOTAL JOINT (CUSTOM PROCEDURE TRAY) ×2 IMPLANT
PAD ARMBOARD 7.5X6 YLW CONV (MISCELLANEOUS) ×2 IMPLANT
PAD DANNIFLEX CPM (ORTHOPEDIC SUPPLIES) ×2 IMPLANT
PIN TROCAR 3 INCH (PIN) ×2 IMPLANT
SET BASIN LINEN APH (SET/KITS/TRAYS/PACK) ×2 IMPLANT
SET HNDPC FAN SPRY TIP SCT (DISPOSABLE) ×1 IMPLANT
SPONGE DRAIN TRACH 4X4 STRL 2S (GAUZE/BANDAGES/DRESSINGS) ×2 IMPLANT
STAPLER VISISTAT 35W (STAPLE) ×2 IMPLANT
SUT BRALON NAB BRD #1 30IN (SUTURE) ×4 IMPLANT
SUT MON AB 0 CT1 (SUTURE) ×3 IMPLANT
SUT MON AB 2-0 CT1 36 (SUTURE) IMPLANT
SYR 20CC LL (SYRINGE) IMPLANT
SYR 30ML LL (SYRINGE) ×3 IMPLANT
SYR BULB IRRIGATION 50ML (SYRINGE) ×2 IMPLANT
TOWEL OR 17X26 4PK STRL BLUE (TOWEL DISPOSABLE) ×2 IMPLANT
TOWER CARTRIDGE SMART MIX (DISPOSABLE) ×2 IMPLANT
TRAY FOLEY CATH 16FR SILVER (SET/KITS/TRAYS/PACK) ×2 IMPLANT
WATER STERILE IRR 1000ML POUR (IV SOLUTION) ×7 IMPLANT
YANKAUER SUCT 12FT TUBE ARGYLE (SUCTIONS) ×2 IMPLANT

## 2013-01-11 NOTE — Anesthesia Postprocedure Evaluation (Signed)
Anesthesia Post Note  Patient: Cody Palmer  Procedure(s) Performed: Procedure(s) (LRB): RIGHT TOTAL KNEE ARTHROPLASTY (Right)  Anesthesia type: Spinal  Patient location: PACU  Post pain: Pain level controlled  Post assessment: Post-op Vital signs reviewed, Patient's Cardiovascular Status Stable, Respiratory Function Stable, Patent Airway, No signs of Nausea or vomiting and Pain level controlled  Last Vitals:  Filed Vitals:   01/11/13 0944  BP: 99/64  Pulse: 60  Temp: 36.6 C  Resp: 20    Post vital signs: Reviewed and stable  Level of consciousness: awake and alert   Complications: No apparent anesthesia complications

## 2013-01-11 NOTE — Clinical Social Work Note (Signed)
CSW received consult for SNF placement, noted PT recommendation of HH PT.  CSW signing off as no SNF placement is indicated and no further SW needs identified.  Santa Genera, LCSW Clinical Social Worker [phone])

## 2013-01-11 NOTE — Op Note (Signed)
Preop diagnosis osteoarthritis RIGHT  knee Postop diagnosis same Procedure RIGHT  total knee arthroplasty Surgeon Romeo Apple Assisted by [facility] AND [facility] Anesthesia spinal Findings SEVERE oa with severe medial wear varus and medial soft tissue contracture Tourniquet time 1 hour 15 minutes minutes, pressure 300 mm of mercury  Indications for procedure disabling knee pain, failure to control pain with nonoperative measures  Details of procedure:  In the preop area the patient's right knee was marked and countersigned by the surgeon, the chart was updated, consent was signed  The patient was taken to the operating room for spinal anesthetic followed by administering 2 g of Ancef based on weight of less than 120  kg  A Foley catheter was inserted sterilely, then the operative extremity  was prepped and draped sterilely  The timeout was completed  The limb was then exsanguinated with a six-inch Esmarch with the knee in flexion and the tourniquet was elevated to 300 mm of mercury. A midline incision was made, the subcutaneous tissues were divided down to the extensor mechanism. A medial arthrotomy was performed, the patella was everted,  the fat pad was resected. The medial and  lateral menisci were resected. The medial soft tissue sleeve was elevated to the mid coronal plane. The anterior cruciate ligament and PCL were resected. Osteophytes were removed. The distal femur anterior surface was skeletonized with sharp dissection.  A three-eighths inch drill bit was used to enter the femoral canal which was suctioned and irrigated until the fluid was clear;  an intramedullary rod was placed in the femur with a 5 valgus, right, 11 mm resection setting, the block was pinned in place; then a 11mm distal femoral resection was performed. The cut was checked for flatness.  The sizing guide was then placed on the femur, the femur measured a size 4; the block was pinned in external rotation 3 using the  epicondyles as reference; a  4-in-1 cutting block was placed and the 4 cuts were made with retractors protecting the collateral ligaments. The posterior osteophytes were removed with a curved osteotome. Residual PCL tissue was resected; residual meniscal tissue was resected.  The external tibial alignment instrument was set for tibial resection. The guide was   placed referencing the medial side which was the worn side and the stylus was set at 2 mm resection. Anterior slope was built in to match the patients anatomy; a neutral varus valgus cut was set using the medial third of the tibial tubercle as reference along with the medial portion of the lateral tibial spine. The guide was stabilized with pins.  A saw was used to resect the anterior tibia. The tibia was sized to a size 5 base plate.  We then balanced the knee using spacer blocks and laminar spreaders. The knee was stable and balanced in flexion and extension  with a 10 spacer. Note this required several repeated releases and rechecks until the entire medial soft tissue sleeve was removed from the proximal tibia including the posterior soft tissue structures including the semimembranosus and then distal releases of the medial hamstrings  The size 4 box cutting guide was placed to resect the bone for the post. We then turned our attention to the patella.  The patella measured 25 mm in thickness; we set the guide to leave 16 mm of patella.  After resection of the patella,  It was remeasured, and measured 15 mm. I sized the patella to a  41. We then drilled the 3 peg holes.  We then did a trial reduction. The trial reduction confirmed that the knee  balanced in extension and balance in flexion. Passive flexion equalled 115, and patella tracking was normal.  The tibial rotation was set avoiding internal rotation, allowing congruency with the femur.  We then punched the tibia per technique with a 4 base plate.   The bone was then irrigated and dried  while cement was mixed on the back table. The implants were checked for accuracy and then cemented in place; excess cement was removed; the cement was allowed to cure. The wound was then irrigated with copious amounts of saline, the posterior capsule was injected with 30 cc of Marcaine with epinephrine followed by 60 cc in the soft tissue. The 10 insert was placed. The range of motion and stability matched trial reduction  The capsule was closed with #1 Bralon in interrupted and running fashion and then the joint was injected with 30 cc of Marcaine with epinephrine.  The subcutaneous tissues were closed with  0 Monocryl in running fashion Staples were used to reapproximate the skin edges.  Sterile dressings were applied.   The patient was then taken to the recovery room in stable condition.  Routine postop plan for knee replacement.

## 2013-01-11 NOTE — Transfer of Care (Signed)
Immediate Anesthesia Transfer of Care Note  Patient: Cody Palmer  Procedure(s) Performed: Procedure(s) (LRB): RIGHT TOTAL KNEE ARTHROPLASTY (Right)  Patient Location: PACU  Anesthesia Type: SAB  Level of Consciousness: awake  Airway & Oxygen Therapy: Patient Spontanous Breathing and non-rebreather face mask  Post-op Assessment: Report given to PACU RN, Post -op Vital signs reviewed and stable. SAB Level  T 12  Post vital signs: Reviewed and stable  Complications: No apparent anesthesia complications

## 2013-01-11 NOTE — Anesthesia Procedure Notes (Signed)
Procedure Name: MAC Date/Time: 01/11/2013 7:25 AM Performed by: Franco Nones Pre-anesthesia Checklist: Patient identified, Emergency Drugs available, Suction available, Timeout performed and Patient being monitored Patient Re-evaluated:Patient Re-evaluated prior to inductionOxygen Delivery Method: Nasal Cannula    Procedure Name: MAC Date/Time: 01/11/2013 7:48 AM Performed by: Franco Nones Pre-anesthesia Checklist: Patient identified, Emergency Drugs available, Suction available, Timeout performed and Patient being monitored Patient Re-evaluated:Patient Re-evaluated prior to inductionOxygen Delivery Method: Non-rebreather mask    Spinal  Patient location during procedure: OR Start time: 01/11/2013 7:36 AM End time: 01/11/2013 7:41 AM Staffing CRNA/Resident: Minerva Areola S Preanesthetic Checklist Completed: patient identified, site marked, surgical consent, pre-op evaluation, timeout performed, IV checked, risks and benefits discussed and monitors and equipment checked Spinal Block Patient position: right lateral decubitus Prep: Betadine Patient monitoring: heart rate, cardiac monitor, continuous pulse ox and blood pressure Approach: right paramedian Location: L3-4 Injection technique: single-shot Needle Needle type: Spinocan  Needle gauge: 22 G Needle length: 9 cm Assessment Sensory level: T8 Additional Notes Betadine prep x 3 1% lidocaine skin wheal 1 cc Clear CSF pre and post injection   ATTEMPTS: 1 TRAY ID: [id number] TRAY EXPIRATION DATE: 2014-12

## 2013-01-11 NOTE — Anesthesia Preprocedure Evaluation (Signed)
Anesthesia Evaluation  Patient identified by MRN, date of birth, ID band Patient awake    Reviewed: Allergy & Precautions, H&P , NPO status , Patient's Chart, lab work & pertinent test results  Airway Mallampati: II TM Distance: >3 FB     Dental  (+) Partial Upper   Pulmonary neg pulmonary ROS, former smoker,  breath sounds clear to auscultation        Cardiovascular hypertension, Pt. on medications and Pt. on home beta blockers Rhythm:Regular Rate:Normal     Neuro/Psych    GI/Hepatic negative GI ROS,   Endo/Other    Renal/GU      Musculoskeletal   Abdominal   Peds  Hematology   Anesthesia Other Findings   Reproductive/Obstetrics                           Anesthesia Physical Anesthesia Plan  ASA: II  Anesthesia Plan: Spinal   Post-op Pain Management:    Induction: Intravenous  Airway Management Planned: Nasal Cannula  Additional Equipment:   Intra-op Plan:   Post-operative Plan:   Informed Consent: I have reviewed the patients History and Physical, chart, labs and discussed the procedure including the risks, benefits and alternatives for the proposed anesthesia with the patient or authorized representative who has indicated his/her understanding and acceptance.     Plan Discussed with:   Anesthesia Plan Comments:         Anesthesia Quick Evaluation

## 2013-01-11 NOTE — Progress Notes (Signed)
UR Chart Review Completed  

## 2013-01-11 NOTE — Evaluation (Signed)
Physical Therapy Evaluation Patient Details Name: Cody Palmer MRN: [id number] DOB: [birth year] Today's Date: 01/11/2013 Time: 0865-7846 PT Time Calculation (min): 61 min  PT Assessment / Plan / Recommendation History of Present Illness  Pt with intractible right knee pain is admitted today for elective right TKR.  He had been in good health and independent in all ADLs PTA.  Clinical Impression   Pt is seen for initial eval/tx.  He is very pleasant and cooperative, pain well controlled.  He was instructed in TKR protocol and we initiated exercise per protocol.  ROM right knee = 0-90 degrees, AA.  He was instructed in gait with a walker and he was able to ambulate 20' with stable gait pattern.  He was up to a chair at end of tx, but feeling a bit light headed.  Chair was reclined back and he was provided some cold drinks whereupon he felt better.  He should progress well through this process and should be able to discharge to home at d/c with HHPT.    PT Assessment  Patient needs continued PT services    Follow Up Recommendations  Home health PT    Does the patient have the potential to tolerate intense rehabilitation      Barriers to Discharge   daughter will be coming from [place] to stay with Dad at d/c    Equipment Recommendations  Rolling walker with 5" wheels;3in1 (PT)    Recommendations for Other Services     Frequency 7X/week    Precautions / Restrictions Precautions Precautions: Fall;Knee Restrictions Weight Bearing Restrictions: No   Pertinent Vitals/Pain       Mobility  Bed Mobility Bed Mobility: Supine to Sit Supine to Sit: HOB elevated;5: Supervision Transfers Transfers: Sit to Stand;Stand to Sit Sit to Stand: 5: Supervision;From bed Stand to Sit: To chair/3-in-1 Ambulation/Gait Ambulation/Gait Assistance: 5: Supervision Ambulation Distance (Feet): 20 Feet Assistive device: Rolling walker Gait Pattern: Decreased stance time - right;Decreased weight shift  to right Gait velocity: WNL General Gait Details: gait is typical following TKR... Stairs: No Wheelchair Mobility Wheelchair Mobility: No    Exercises Total Joint Exercises Ankle Circles/Pumps: AROM;Both;10 reps;Supine Quad Sets: AROM;Both;10 reps;Supine Short Arc Quad: AROM;Right;10 reps;Supine Heel Slides: AAROM;Right;10 reps;Supine Goniometric ROM: 0-90 degrees right knee   PT Diagnosis: Difficulty walking;Generalized weakness;Acute pain;Abnormality of gait  PT Problem List: Decreased strength;Decreased range of motion;Decreased activity tolerance;Decreased mobility;Decreased knowledge of use of DME;Decreased knowledge of precautions;Decreased safety awareness;Pain PT Treatment Interventions: DME instruction;Gait training;Stair training;Functional mobility training;Therapeutic exercise;Therapeutic activities;Patient/family education     PT Goals(Current goals can be found in the care plan section) Acute Rehab PT Goals Patient Stated Goal: return to golf and to part time job PT Goal Formulation: With patient/family Time For Goal Achievement: 01/18/13 Potential to Achieve Goals: Good  Visit Information  Last PT Received On: 01/11/13 History of Present Illness: Pt with intractible right knee pain is admitted today for elective right TKR.  He had been in good health and independent in all ADLs PTA.       Prior Functioning  Home Living Family/patient expects to be discharged to:: Private residence Living Arrangements: Alone (daughter will stay with him) Available Help at Discharge: Family Type of Home: House Home Access: Stairs to enter Secretary/administrator of Steps: 4 Entrance Stairs-Rails: Right Home Layout: One level Home Equipment: Crutches Prior Function Level of Independence: Independent with assistive device(s) Communication Communication: No difficulties    Cognition  Cognition Arousal/Alertness: Awake/alert Overall Cognitive Status: Within Functional Limits  for tasks assessed    Extremity/Trunk Assessment Upper Extremity Assessment Upper Extremity Assessment: Overall WFL for tasks assessed Lower Extremity Assessment Lower Extremity Assessment: RLE deficits/detail RLE Deficits / Details: AA ROM right knee=0-90 degrees   Balance Balance Balance Assessed: No  End of Session PT - End of Session Equipment Utilized During Treatment: Gait belt Activity Tolerance: Patient tolerated treatment well Patient left: in chair;with call bell/phone within reach;with family/visitor present Nurse Communication: Mobility status  GP     Konrad Penta 01/11/2013, 3:48 PM

## 2013-01-11 NOTE — Brief Op Note (Signed)
01/11/2013  9:40 AM  PATIENT:  Cody Palmer  79 y.o. male  PRE-OPERATIVE DIAGNOSIS:  Osteoarthritis right knee  POST-OPERATIVE DIAGNOSIS:  Osteoarthritis right knee  FINDINGS: SEVERE OA AND SEVERE VARUS DEFORMITY AND MEDIAL SOFT TISSUE CONTRACTURE   89F 5 T 41 PATELLA AND 10 POLY   PROCEDURE:  Procedure(s): RIGHT TOTAL KNEE ARTHROPLASTY (Right)  SURGEON:  Surgeon(s) and Role:    * Vickki Hearing, MD - Primary  PHYSICIAN ASSISTANT:   ASSISTANTS: WAYNE AND NICOLE    ANESTHESIA:   spinal  EBL:  Total I/O In: 500 [I.V.:500] Out: 300 [Urine:300]  BLOOD ADMINISTERED:none  DRAINS: HEMOVAC   LOCAL MEDICATIONS USED:  MARCAINE0.5% WITH EPI    and Amount: 90 ml  SPECIMEN:  No Specimen  DISPOSITION OF SPECIMEN:  N/A  COUNTS:  YES  TOURNIQUET:  * Missing tourniquet times found for documented tourniquets in log:  [id number] *  DICTATION: .Dragon Dictation  PLAN OF CARE: Admit to inpatient   PATIENT DISPOSITION:  PACU - hemodynamically stable.   Delay start of Pharmacological VTE agent (>24hrs) due to surgical blood loss or risk of bleeding: yes

## 2013-01-11 NOTE — Interval H&P Note (Signed)
History and Physical Interval Note:  01/11/2013 7:15 AM  Cody Palmer  has presented today for surgery, with the diagnosis of Osteoarthritis right knee  The various methods of treatment have been discussed with the patient and family. After consideration of risks, benefits and other options for treatment, the patient has consented to  Procedure(s): TOTAL KNEE ARTHROPLASTY (Right) as a surgical intervention .  The patient's history has been reviewed, patient examined, no change in status, stable for surgery.  I have reviewed the patient's chart and labs.  Questions were answered to the patient's satisfaction.     Fuller Canada

## 2013-01-12 LAB — CBC
MCH: 28.9 pg (ref 26.0–34.0)
Platelets: 195 10*3/uL (ref 150–400)
RBC: 3.7 MIL/uL — ABNORMAL LOW (ref 4.22–5.81)
RDW: 13.7 % (ref 11.5–15.5)
WBC: 13.9 10*3/uL — ABNORMAL HIGH (ref 4.0–10.5)

## 2013-01-12 LAB — BASIC METABOLIC PANEL
Calcium: 8.5 mg/dL (ref 8.4–10.5)
Creatinine, Ser: 1.13 mg/dL (ref 0.50–1.35)
GFR calc non Af Amer: 61 mL/min — ABNORMAL LOW (ref >90)
Sodium: 134 mEq/L — ABNORMAL LOW (ref 135–145)

## 2013-01-12 MED ORDER — MAGNESIUM CITRATE PO SOLN
0.5000 | Freq: Once | ORAL | Status: AC
  Administered 2013-01-12: 0.5 via ORAL
  Filled 2013-01-12: qty 296

## 2013-01-12 NOTE — Progress Notes (Signed)
Subjective: 1 Day Post-Op Procedure(s) (LRB): RIGHT TOTAL KNEE ARTHROPLASTY (Right) Patient reports pain as mild and moderate .    Objective: Vital signs in last 24 hours: Temp:  [97.6 F (36.4 C)-98.9 F (37.2 C)] 98.1 F (36.7 C) (08/30 0544) Pulse Rate:  [53-79] 53 (08/30 0544) Resp:  [14-20] 20 (08/30 0544) BP: (92-144)/(47-79) 138/57 mmHg (08/30 0544) SpO2:  [93 %-98 %] 96 % (08/30 0544)  Intake/Output from previous day: 08/29 0701 - 08/30 0700 In: 3386.7 [P.O.:940; I.V.:1986.7; IV Piggyback:150] Out: 2895 [Urine:2875; Drains:20] Intake/Output this shift:     Recent Labs  01/12/13 0520  HGB 10.7*    Recent Labs  01/12/13 0520  WBC 13.9*  RBC 3.70*  HCT 32.1*  PLT 195    Recent Labs  01/12/13 0520  NA 134*  K 4.0  CL 98  CO2 30  BUN 24*  CREATININE 1.13  GLUCOSE 139*  CALCIUM 8.5   No results found for this basename: LABPT, INR,  in the last 72 hours  Neurologically intact Compartment soft  Assessment/Plan: 1 Day Post-Op Procedure(s) (LRB): RIGHT TOTAL KNEE ARTHROPLASTY (Right) Advance diet Up with therapy D/C IV fluids Charge drain   Fuller Canada 01/12/2013, 7:39 AM

## 2013-01-12 NOTE — Anesthesia Postprocedure Evaluation (Signed)
  Anesthesia Post-op Note  Patient: Cody Palmer  Procedure(s) Performed: Procedure(s): RIGHT TOTAL KNEE ARTHROPLASTY (Right)  Patient Location: room 339esthesia Type:Spinal  Level of Consciousness: awake, alert , oriented and patient cooperative  Airway and Oxygen Therapy: Patient Spontanous Breathing  Post-op Pain: 4 /10, moderate  Post-op Assessment: Post-op Vital signs reviewed, Patient's Cardiovascular Status Stable, Respiratory Function Stable, Patent Airway, No signs of Nausea or vomiting and Adequate PO intake  Post-op Vital Signs: Reviewed and stable  Complications: No apparent anesthesia complications

## 2013-01-12 NOTE — Progress Notes (Signed)
Physical Therapy Treatment Patient Details Name: Cody Palmer MRN: [id number] DOB: 1932-06-22 Today's Date: 01/12/2013 Time: 2440-1027 PT Time Calculation (min): 41 min  PT Assessment / Plan / Recommendation  History of Present Illness     PT Comments   Pt was feeling well at the time of my arrival, but was worried that he would have more pain with PT today.  He did quite well, however, with minimal pain reported on exercise.  Right knee ROM was 0-82 degrees, AA and he is able to do SAQ independently.  He is able to transfer in and out of bed with only SBA and gait is functional with a walker.  He preferred to sit up in a chair for an hur, so the CPM was fitted to him and adjusted to 0-60 degrees.  I spoke with RN about the proper fit and she will initiate CPM at around [facility] for 6-8 hours as directed.  Follow Up Recommendations        Does the patient have the potential to tolerate intense rehabilitation     Barriers to Discharge        Equipment Recommendations       Recommendations for Other Services    Frequency     Progress towards PT Goals Progress towards PT goals: Progressing toward goals  Plan Current plan remains appropriate    Precautions / Restrictions     Pertinent Vitals/Pain     Mobility  Bed Mobility Supine to Sit: 5: Supervision Transfers Sit to Stand: 5: Supervision;From bed Stand to Sit: 5: Supervision;To chair/3-in-1 Ambulation/Gait Ambulation/Gait Assistance: 5: Supervision Ambulation Distance (Feet): 60 Feet Assistive device: Rolling walker Gait Pattern: Within Functional Limits General Gait Details: instructed in equalizing step length Stairs: No Wheelchair Mobility Wheelchair Mobility: No    Exercises Total Joint Exercises Ankle Circles/Pumps: AROM;Both;10 reps;Supine Quad Sets: AROM;Both;10 reps;Supine Short Arc Quad: AROM;Right;10 reps;Supine Heel Slides: AROM;AAROM;Right;10 reps;Supine Goniometric ROM: 0-82 degrees , AA   PT  Diagnosis:    PT Problem List:   PT Treatment Interventions:     PT Goals (current goals can now be found in the care plan section)    Visit Information  Last PT Received On: 01/12/13    Subjective Data      Cognition       Balance     End of Session PT - End of Session Equipment Utilized During Treatment: Gait belt Activity Tolerance: Patient tolerated treatment well Patient left: in chair;with call bell/phone within reach;with family/visitor present Nurse Communication: Mobility status   GP     Konrad Penta 01/12/2013, 2:21 PM

## 2013-01-13 LAB — CBC
HCT: 28.5 % — ABNORMAL LOW (ref 39.0–52.0)
Hemoglobin: 9.5 g/dL — ABNORMAL LOW (ref 13.0–17.0)
MCHC: 33.3 g/dL (ref 30.0–36.0)
RDW: 13.5 % (ref 11.5–15.5)
WBC: 13.2 10*3/uL — ABNORMAL HIGH (ref 4.0–10.5)

## 2013-01-13 NOTE — Progress Notes (Signed)
Subjective: 2 Days Post-Op Procedure(s) (LRB): RIGHT TOTAL KNEE ARTHROPLASTY (Right) Patient reports pain as mild.    Objective: Vital signs in last 24 hours: BP 113/51  Pulse 70  Temp(Src) 97.9 F (36.6 C) (Oral)  Resp 20  Ht 5\' 10"  (1.778 m)  Wt 210 lb (95.255 kg)  BMI 30.13 kg/m2  SpO2 94%   HG 9.5   Neurologically intact ABD soft Neurovascular intact Sensation intact distally Intact pulses distally Dorsiflexion/Plantar flexion intact Incision: no drainage No cellulitis present Compartment soft  Assessment/Plan: 2 Days Post-Op Procedure(s) (LRB): RIGHT TOTAL KNEE ARTHROPLASTY (Right) Up with therapy Plan for discharge tomorrow Discharge home with home health  Cordera Stineman 01/13/2013, 8:35 AM

## 2013-01-13 NOTE — Progress Notes (Signed)
Physical Therapy Treatment Patient Details Name: Cody Palmer MRN: [id number] DOB: [birth year] Today's Date: 01/13/2013 Time: 7829-5621 PT Time Calculation (min): 58 min  PT Assessment / Plan / Recommendation  History of Present Illness     PT Comments   Pt is progressing exceedingly well.  Right knee AA ROM is 0-100 degrees, minimal pain or edema.  He is now independent with transfer in and out of bed.  He was instructed in HEP of basic exercise with tips on how to self stretch in both flexion and extension.  His gait is WNL after cueing for equalizing step length.  He preferred to wait until tomorrow for instruction in climbing steps.  CPM was reset to 0-70 degrees and nursing service will initiate it after lunch.  He was provided with written care instructions from PT perspective.  Daughter, with whom he will be staying, was present and all of her questions were answered.  Follow Up Recommendations        Does the patient have the potential to tolerate intense rehabilitation     Barriers to Discharge        Equipment Recommendations       Recommendations for Other Services    Frequency     Progress towards PT Goals Progress towards PT goals: Progressing toward goals  Plan Current plan remains appropriate    Precautions / Restrictions     Pertinent Vitals/Pain     Mobility  Bed Mobility Supine to Sit: 7: Independent Sit to Supine: 7: Independent Transfers Sit to Stand: 6: Modified independent (Device/Increase time);From bed Stand to Sit: 6: Modified independent (Device/Increase time);To chair/3-in-1;To bed Ambulation/Gait Ambulation/Gait Assistance: 5: Supervision Ambulation Distance (Feet): 175 Feet Assistive device: Standard walker Gait Pattern: Within Functional Limits Gait velocity: WNL General Gait Details: pt again instructed in equalizing step length Stairs: No (he preferred to wait til tomorrow)    Exercises Total Joint Exercises Ankle Circles/Pumps:  AROM;Both;10 reps;Supine Quad Sets: AROM;Both;10 reps;Supine Short Arc Quad: AROM;Right;10 reps;Supine Heel Slides: AROM;AAROM;Right;15 reps;Supine;Seated (instructed in supine and sitting self stretch) Goniometric ROM: 0-100 degrees, AA   PT Diagnosis:    PT Problem List:   PT Treatment Interventions:     PT Goals (current goals can now be found in the care plan section)    Visit Information  Last PT Received On: 01/13/13    Subjective Data      Cognition       Balance     End of Session PT - End of Session Equipment Utilized During Treatment: Gait belt Activity Tolerance: Patient tolerated treatment well Patient left: in chair;with call bell/phone within reach;with family/visitor present Nurse Communication: Mobility status   GP     Myrlene Broker L 01/13/2013, 10:16 AM

## 2013-01-13 NOTE — Progress Notes (Signed)
Patient was placed into the CPM machine at 1430 pm.  It was in proper position.  I will pass on to the night nurse that the machine is to come off at 2030 at the minimal.

## 2013-01-14 LAB — CBC
Hemoglobin: 9.9 g/dL — ABNORMAL LOW (ref 13.0–17.0)
MCH: 29.2 pg (ref 26.0–34.0)
Platelets: 204 10*3/uL (ref 150–400)
RBC: 3.39 MIL/uL — ABNORMAL LOW (ref 4.22–5.81)
WBC: 12.2 10*3/uL — ABNORMAL HIGH (ref 4.0–10.5)

## 2013-01-14 MED ORDER — ASPIRIN 325 MG PO TBEC: 325.0000 mg | DELAYED_RELEASE_TABLET | Freq: Two times a day (BID) | ORAL | Status: DC

## 2013-01-14 MED ORDER — HYDROCODONE-ACETAMINOPHEN 7.5-325 MG PO TABS: 1.0000 | ORAL_TABLET | ORAL | Status: DC

## 2013-01-14 NOTE — Care Management Note (Signed)
    Page 1 of 2   01/14/2013     2:46:59 PM   CARE MANAGEMENT NOTE 01/14/2013  Patient:  Cody Palmer, Cody Palmer   Account Number:  [id number]  Date Initiated:  01/14/2013  Documentation initiated by:  Anibal Henderson  Subjective/Objective Assessment:   Admitted from home following rt knee surgery. Pt is independent, lives at home alone, and is doing very well after surgery, so plans to return home. He has 2 daughters who will be assisting, and will need HH PT to follow     Action/Plan:   Pt needs RW, BSC, CPM   Anticipated DC Date:  01/14/2013   Anticipated DC Plan:  HOME W HOME HEALTH SERVICES      DC Planning Services  CM consult      PAC Choice  DURABLE MEDICAL EQUIPMENT  HOME HEALTH   Choice offered to / List presented to:  C-1 Patient   DME arranged  3-N-1  WALKER - ROLLING  CPM      DME agency  [facility]     HH arranged  HH-2 PT      [facility] agency  [facility]   Status of service:  Completed, signed off Medicare Important Message given?  YES (If response is "NO", the following Medicare IM given date fields will be blank) Date Medicare IM given:  01/14/2013 Date Additional Medicare IM given:    Discharge Disposition:  HOME W HOME HEALTH SERVICES  Per UR Regulation:  Reviewed for med. necessity/level of care/duration of stay  If discussed at Long Length of Stay Meetings, dates discussed:    Comments:  01/14/13 1030 Anibal Henderson RN/CM

## 2013-01-14 NOTE — Progress Notes (Signed)
Physical Therapy Treatment Patient Details Name: Cody Palmer MRN: [id number] DOB: 1932-12-28 Today's Date: 01/14/2013 Time: 0810-0825 PT Time Calculation (min): 15 min  PT Assessment / Plan / Recommendation  PT Comments   Pt is progressing very well. Pt has minimal difficulty with gait and stair training. Pt was educated on negotiating stairs sideways using right handrail as this is what he has at home. Pt is able to do this with minimal difficulty. Pt is without complaint throughout session.  Plan Current plan remains appropriate       Pertinent Vitals/Pain No pain at rest; increases with movement    Mobility  Transfers Transfers: Sit to Stand;Stand to Sit Sit to Stand: 6: Modified independent (Device/Increase time);From bed Stand to Sit: 6: Modified independent (Device/Increase time);To bed Ambulation/Gait Ambulation/Gait Assistance: 5: Supervision Ambulation Distance (Feet): 200 Feet Assistive device: Standard walker Gait Pattern: Decreased hip/knee flexion - right Gait velocity: WNL General Gait Details: Pt requires vc's to increase right knee flexion.  Stairs: Yes Stairs Assistance: 4: Min guard Stair Management Technique: One rail Right;Sideways Number of Stairs: 5      Visit Information  Last PT Received On: 01/14/13    Subjective Data  I'm ready to go home.  End of Session PT - End of Session Equipment Utilized During Treatment: Gait belt Activity Tolerance: Patient tolerated treatment well Patient left: in bed;with call bell/phone within reach;with family/visitor present   Seth Bake, PTA  01/14/2013, 8:36 AM

## 2013-01-14 NOTE — Progress Notes (Signed)
UR chart review completed.  

## 2013-01-14 NOTE — Progress Notes (Signed)
Pt ws discharged with instructions, prescriptions, and care notes.  He verbalized understanding.  The patient left the floor via w/c with staff and family.  Prior to discharge the dressing was changed and the daughters were instructed on the care for the elastic stocking: removing them at bedtime, and how to put them on. They verbalized understanding.

## 2013-01-14 NOTE — Discharge Summary (Signed)
Physician Discharge Summary  Patient ID: Cody Palmer MRN: [id number] DOB/AGE: 01-07-1933 79 y.o.  Admit date: 01/11/2013 Discharge date: 01/14/2013  Admission Diagnoses: OA RIGHT KNEE   Discharge Diagnoses: SAME  Active Problems:   * No active hospital problems. *   Discharged Condition: good  Hospital Course: EXCELLENT W/O COMPLICATION Pt is progressing exceedingly well.  Right knee AA ROM is 0-100 degrees, Ambulation/Gait Ambulation/Gait Assistance: 5: Supervision Ambulation Distance (Feet): 200 Feet Assistive device: Standard walker Total Joint Exercises  Ankle Circles/Pumps: AROM;Both;10 reps;Supine Quad Sets: AROM;Both;10 reps;Supine Short Arc Quad: AROM;Right;10 reps;Supine Heel Slides: AROM;AAROM;Right;15 reps;Supine;Seated (instructed in supine and sitting self stretch) Goniometric ROM: 0-100 degrees, AA    Consults: None  Significant Diagnostic Studies: labs: HG 9.5  Treatments: surgery: RIGHT TKA DEPUY PS FB   Discharge Exam: Blood pressure 138/67, pulse 70, temperature 97.6 F (36.4 C), temperature source Oral, resp. rate 20, height 5\' 10"  (1.778 m), weight 210 lb (95.255 kg), SpO2 94.00%. General appearance: alert Incision/Wound:NORMAL WITHOUT DRAINAGE OR ERYTHEMA   Disposition: Final discharge disposition not confirmed  Discharge Orders   Future Appointments Provider Department Dept Phone   01/24/2013 1:45 PM Vickki Hearing, MD [facility] [phone]   Future Orders Complete By Expires   Call MD / Call 911  As directed    Comments:     If you experience chest pain or shortness of breath, CALL 911 and be transported to the hospital emergency room.  If you develope a fever above 101 F, pus (white drainage) or increased drainage or redness at the wound, or calf pain, call your surgeon's office.   Constipation Prevention  As directed    Comments:     Drink plenty of fluids.  Prune juice may be helpful.  You may use a  stool softener, such as Colace (over the counter) 100 mg twice a day.  Use MiraLax (over the counter) for constipation as needed.   CPM  As directed    Comments:     Continuous passive motion machine (CPM):      Use the CPM from 0 to  for 80 hours per day.      You may increase by 10 per day.  You may break it up into 2 or 3 sessions per day.      Use CPM for 3 weeks or until you are told to stop.   Diet - low sodium heart healthy  As directed    Discharge instructions  As directed    Comments:     No lifting x 12 weeks No driving 12 weeks No bath x 2 weeks  DVT prevention TED HOSE AND ECOTRIN 325 MG DAILY X 4 WEEKS  STAPLES OUT POD 14 F/U POD 14  CPM 0-80 degrees, INCREASE 10 DEGREES PER DAY AS TOLERATED [X 3 WEEKS], USE 6 HRS PER DAY  WBAT   Face-to-face encounter (required for Medicare/Medicaid patients)  As directed    Comments:     I Nellie Chevalier certify that this patient is under my care and that I, or a nurse practitioner or physician's assistant working with me, had a face-to-face encounter that meets the physician face-to-face encounter requirements with this patient on 01/14/2013. The encounter with the patient was in whole, or in part for the following medical condition(s) which is the primary reason for home health care (List medical condition): knee replacement  Therapist:  Quad sets SAQ SLR aarom  Gait training  NO SQUATS  Questions:     The encounter with the patient was in whole, or in part, for the following medical condition, which is the primary reason for home health care:  knee replacement   I certify that, based on my findings, the following services are medically necessary home health services:  Physical therapy   My clinical findings support the need for the above services:  Unable to leave home safely without assistance and/or assistive device   Further, I certify that my clinical findings support that this patient is homebound due to:  Unable to leave  home safely without assistance   Reason for Medically Necessary Home Health Services:  Therapy- Investment banker, operational, Patent examiner   Home Health  As directed    Questions:     To provide the following care/treatments:  PT   Increase activity slowly as tolerated  As directed    TED hose  As directed    Comments:     Use stockings (TED hose) for 4 weeks on both leg(s).  You may remove them at night for sleeping.       Medication List    STOP taking these medications       aspirin 81 MG tablet  Replaced by:  aspirin 325 MG EC tablet     naproxen 500 MG tablet  Commonly known as:  NAPROSYN      TAKE these medications       amLODipine 10 MG tablet  Commonly known as:  NORVASC  Take 10 mg by mouth daily.     aspirin 325 MG EC tablet  Take 1 tablet (325 mg total) by mouth 2 (two) times daily.     HYDROcodone-acetaminophen 7.5-325 MG per tablet  Commonly known as:  NORCO  Take 1 tablet by mouth every 4 (four) hours.     labetalol 200 MG tablet  Commonly known as:  NORMODYNE  Take 200 mg by mouth 2 (two) times daily.     losartan-hydrochlorothiazide 100-25 MG per tablet  Commonly known as:  HYZAAR  Take 1 tablet by mouth daily.     rosuvastatin 10 MG tablet  Commonly known as:  CRESTOR  Take 10 mg by mouth daily.           Follow-up Information   Follow up with Fuller Canada, MD On 01/24/2013.   Specialties:  Orthopedic Surgery, Radiology   Contact information:   [address], [address] [place] [address] [phone]       Signed: Talyn Dessert 01/14/2013, 9:55 AM

## 2013-01-15 ENCOUNTER — Telehealth: Payer: Self-pay | Admitting: Orthopedic Surgery

## 2013-01-15 ENCOUNTER — Encounter (HOSPITAL_COMMUNITY): Payer: Self-pay | Admitting: Orthopedic Surgery

## 2013-01-15 NOTE — Telephone Encounter (Signed)
Routing to Dr Krieger 

## 2013-01-15 NOTE — Telephone Encounter (Signed)
Call received from [facility], Verlon Au, direct ph [phone], states saw patient for initial visit today 01/15/13, status post total knee surgery 01/11/13 -- asking about dosage of 325 mg aspirin -- states noted as 2x daily. Correct ("not 1x daily")?  Please advise.

## 2013-01-15 NOTE — Telephone Encounter (Signed)
ITS TWICE A DAY AS ORDERED

## 2013-01-15 NOTE — Telephone Encounter (Signed)
Verlon Au informed of Dr. Mort Sawyers reply.

## 2013-01-16 ENCOUNTER — Encounter: Payer: Self-pay | Admitting: Orthopedic Surgery

## 2013-01-16 LAB — TYPE AND SCREEN
ABO/RH(D): O POS
Antibody Screen: NEGATIVE
Unit division: 0

## 2013-01-24 ENCOUNTER — Encounter: Payer: Self-pay | Admitting: Orthopedic Surgery

## 2013-01-24 ENCOUNTER — Ambulatory Visit (INDEPENDENT_AMBULATORY_CARE_PROVIDER_SITE_OTHER): Payer: Medicare Other | Admitting: Orthopedic Surgery

## 2013-01-24 VITALS — BP 138/64 | Ht 70.0 in | Wt 210.0 lb

## 2013-01-24 DIAGNOSIS — Z96651 Presence of right artificial knee joint: Secondary | ICD-10-CM

## 2013-01-24 DIAGNOSIS — Z96659 Presence of unspecified artificial knee joint: Secondary | ICD-10-CM

## 2013-01-24 NOTE — Progress Notes (Signed)
Patient ID: Cody Palmer, male   DOB: 01/07/1933, 79 y.o.   MRN: [id number]  Chief Complaint  Patient presents with  . Follow-up    Post op 1 Right TKA DOS 01/11/13   BP 138/64  Ht 5\' 10"  (1.778 m)  Wt 210 lb (95.255 kg)  BMI 30.13 kg/m2  Postop visit status post total knee arthroplasty Date of surgery  August 29 Diagnosis  Osteoarthritis Operative findings severe arthritis Implant Depuy  posterior stabilized total knee replacement DVT prophylaxis Ecotrin twice a day Living situation home Complaints none Exam the incision looks clean Steri-Strips are applied staples were taken out Plan finish home therapy and start outpatient therapy followup with me in one month

## 2013-01-28 ENCOUNTER — Ambulatory Visit (HOSPITAL_COMMUNITY)
Admission: RE | Admit: 2013-01-28 | Discharge: 2013-01-28 | Disposition: A | Discharge status: Home / Self Care | Payer: Medicare Other | Source: Ambulatory Visit | Attending: Orthopedic Surgery | Admitting: Orthopedic Surgery

## 2013-01-28 DIAGNOSIS — M25661 Stiffness of right knee, not elsewhere classified: Secondary | ICD-10-CM | POA: Insufficient documentation

## 2013-01-28 DIAGNOSIS — M6281 Muscle weakness (generalized): Secondary | ICD-10-CM | POA: Insufficient documentation

## 2013-01-28 DIAGNOSIS — I10 Essential (primary) hypertension: Secondary | ICD-10-CM | POA: Insufficient documentation

## 2013-01-28 DIAGNOSIS — M25669 Stiffness of unspecified knee, not elsewhere classified: Secondary | ICD-10-CM | POA: Insufficient documentation

## 2013-01-28 DIAGNOSIS — M25569 Pain in unspecified knee: Secondary | ICD-10-CM | POA: Insufficient documentation

## 2013-01-28 DIAGNOSIS — IMO0001 Reserved for inherently not codable concepts without codable children: Secondary | ICD-10-CM | POA: Insufficient documentation

## 2013-01-28 DIAGNOSIS — R262 Difficulty in walking, not elsewhere classified: Secondary | ICD-10-CM | POA: Insufficient documentation

## 2013-01-28 DIAGNOSIS — R29898 Other symptoms and signs involving the musculoskeletal system: Secondary | ICD-10-CM | POA: Insufficient documentation

## 2013-01-28 NOTE — Evaluation (Signed)
Physical Therapy Evaluation  Patient Details  Name: Cody Palmer MRN: [id number] Date of Birth: [birth year]  Today's Date: 01/28/2013 Time: 6213-0865 PT Time Calculation (min): 31 min Charges: 1 evaluation             Visit#: 1 of 8  Re-eval: 02/27/13 Assessment Diagnosis: Rt TKA Surgical Date: 01/11/13 Next MD Visit: Dr. Romeo Apple - 02/21/13 Prior Therapy: HHPT  Authorization: Medicare    Authorization Time Period:    Authorization Visit#: 1 of 10   Past Medical History:  Past Medical History  Diagnosis Date  . HTN (hypertension)   . Hypercholesteremia   . Arthritis    Past Surgical History:  Past Surgical History  Procedure Laterality Date  . Eye surgery  1970 's    removal of eye  . Total knee arthroplasty Right 01/11/2013    Procedure: RIGHT TOTAL KNEE ARTHROPLASTY;  Surgeon: Vickki Hearing, MD;  Location: [facility];  Service: Orthopedics;  Laterality: Right;    Subjective Symptoms/Limitations Symptoms: Pt is a 79 year old male referred to PT s/p Rt TKA on 01/11/13.  Reports that he has some pain and tenderness to the front of his knee.  has difficulty ambulating independently, increased fear of falling, He is using his CPM and it is up to 105 and is using it 2x2 hours a day. Patient Stated Goals: wants to be able to get back to work duties and golf.   Pain Assessment Currently in Pain?: Yes Pain Score: 2  Pain Location: Knee Pain Orientation: Right;Anterior;Mid Pain Type: Surgical pain Pain Onset: 1 to 4 weeks ago Pain Frequency: Occasional Pain Relieving Factors: pain medication  Effect of Pain on Daily Activities: difficulty walking independently  Precautions/Restrictions  Precautions Precautions: Fall;Knee  Balance Screening Balance Screen Has the patient fallen in the past 6 months: No Has the patient had a decrease in activity level because of a fear of falling? : Yes Is the patient reluctant to leave their home because of a fear of falling? 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   Leisure: Hobbies-yes (Comment) Comments: Enjoys playing golf  Cognition/Observation Observation/Other Assessments Observations: increased edema Other Assessments: Had steristrips over surgical incsion, some eccyhomsis to lower medial leg  Sensation/Coordination/Flexibility/Functional Tests Coordination Gross Motor Movements are Fluid and Coordinated: No Coordination and Movement Description: impaired to hip abduction, extension and adduction Functional Tests Functional Tests: 5 sit to stands w/o UE support: 17.34 secs Functional Tests: activity specific Balance Confidence (ABC): 72.5   RLE AROM (degrees) Right Knee Extension: 0 Right Knee Flexion: 100 RLE PROM (degrees) Right Knee Extension: 0 Right Knee Flexion: 108 RLE Strength Right Hip Flexion: 4/5 Right Hip Extension: 3+/5 Right Hip ABduction: 3+/5 Right Hip ADduction: 4/5 Right Knee Flexion: 5/5 Right Knee Extension: 3+/5 Right Ankle Dorsiflexion: 5/5 Palpation Palpation: increased pain and tenderness to anterior Rt knee  Mobility/Balance  Transfers Transfers: Sit to Stand;Stand to Sit Sit to Stand: 7: Independent Stand to Sit: 7: Independent Ambulation/Gait Ambulation/Gait Assistance: 6: Modified independent (Device/Increase time) Assistive device: Rolling walker Gait Pattern: Decreased hip/knee flexion - right Static Standing  Balance Single Leg Stance - Right Leg: 3 Single Leg Stance - Left Leg: 3 Tandem  Stance - Right Leg: 20 Tandem Stance - Left Leg: 20 Rhomberg - Eyes Opened: 20 Rhomberg - Eyes Closed: 20    Exercise/Treatments Supine Heel Slides: Right;5 reps Sidelying Hip ABduction: Right;10 reps Hip ADduction: Right;10 reps Prone  Hamstring Curl: 10 reps Hip Extension: 10 reps    Physical Therapy Assessment and Plan PT Assessment and Plan Clinical Impression Statement: Pt is a 79 year old male referred to PT s/p Rt TKA with impairments listed below.  Pt will benefit from skilled therapeutic intervention in order to improve on the following deficits: Decreased activity tolerance;Decreased balance;Pain;Decreased strength;Difficulty walking;Decreased coordination;Impaired perceived functional ability;Decreased range of motion;Decreased mobility Rehab Potential: Good PT Frequency: Min 2X/week PT Duration: 4 weeks PT Treatment/Interventions: DME instruction;Gait training;Stair training;Functional mobility training;Therapeutic exercise;Therapeutic activities;Patient/family education;Manual techniques;Modalities;Balance training PT Plan: NuStep/Bike, squats, heel/toe raises, rocker board, standing knee flexion.  Educate on edema management.     Goals Home Exercise Program Pt/caregiver will Perform Home Exercise Program: Independently PT Goal: Perform Home Exercise Program - Progress: Goal set today PT Short Term Goals Time to Complete Short Term Goals: 2 weeks PT Short Term Goal 1: Pt will improve his Rt knee AROM 0-115 for increased ease with sit to stand activities.  PT Short Term Goal 2: Pt will report pain less than 4/10 to his Rt knee for improved QOL.  PT Short Term Goal 3: Pt will improve his proprioceptive awareness and demonstrate Rt and Lt SLS x10 sec on solid surface in order to begin ambulating independently.  PT Short Term Goal 4: Pt will present without edema to Rt knee and verablize techniques to reduce edema. PT Long Term Goals Time to Complete Long Term Goals: 4  weeks PT Long Term Goal 1: Pt will improve his LE strength to [facility] In order to demonstrate independent ambulation x30 minutes  PT Long Term Goal 2: Pt will improve his Rt knee AROM to [facility] in order to ascend and descend 5 steps w/1 handrail with reciprocal pattern in order to safely enter his home.  Long Term Goal 3: Pt will improve his functional strength and demonstrate 5 STS in 12 seconds for normal age range. Long Term Goal 4: Pt will improve his ABC to greater than 80% for improved percieved functional ability   Problem List Patient Active Problem List   Diagnosis Date Noted  . Stiffness of right knee 01/28/2013  . Weakness of right leg 01/28/2013  . Difficulty in walking(719.7) 01/28/2013  . Arthritis of knee, degenerative 12/13/2012    PT - End of Session Activity Tolerance: Patient tolerated treatment well General Behavior During Therapy: WFL for tasks assessed/performed PT Plan of Care PT Home Exercise Plan: given PT Patient Instructions: importance of HEP, elevation for edema management Consulted and Agree with Plan of Care: Patient  GP Functional Assessment Tool Used: ABC: 72.5% Functional Limitation: Mobility: Walking and moving around Mobility: Walking and Moving Around Current Status ([id number]): At least 20 percent but less than 40 percent impaired, limited or restricted Mobility: Walking and Moving Around Goal Status [phone]): At least 1 percent but less than 20 percent impaired, limited or restricted  Edsel Shives, MPT, ATC 01/28/2013, 2:12 PM  Physician Documentation Your signature is required to indicate approval of the treatment plan as stated above.  Please sign and either send electronically or make a copy of this report for your files and return this physician signed original.  Please mark one 1.__approve of plan  2. ___approve of plan with the following conditions.   ______________________________                                                           _____________________ Physician Signature                                                                                                             Date

## 2013-01-31 ENCOUNTER — Ambulatory Visit (HOSPITAL_COMMUNITY)
Admission: RE | Admit: 2013-01-31 | Discharge: 2013-01-31 | Disposition: A | Discharge status: Home / Self Care | Payer: Medicare Other | Source: Ambulatory Visit | Attending: Family Medicine | Admitting: Family Medicine

## 2013-01-31 DIAGNOSIS — R262 Difficulty in walking, not elsewhere classified: Secondary | ICD-10-CM

## 2013-01-31 DIAGNOSIS — R29898 Other symptoms and signs involving the musculoskeletal system: Secondary | ICD-10-CM

## 2013-01-31 DIAGNOSIS — M25661 Stiffness of right knee, not elsewhere classified: Secondary | ICD-10-CM

## 2013-01-31 NOTE — Progress Notes (Signed)
Physical Therapy Treatment Patient Details  Name: JOHNEDWARD BRODRICK MRN: [id number] Date of Birth: 1932/11/13  Today's Date: 01/31/2013 Time: 1478-2956 PT Time Calculation (min): 48 min Charge: TE 2130-8657  Visit#: 2 of 8  Re-eval: 02/27/13 Assessment Diagnosis: Rt TKA Surgical Date: 01/11/13 Next MD Visit: Dr. Romeo Apple - 02/21/13 Prior Therapy: HHPT  Authorization: Medicare  Authorization Time Period:    Authorization Visit#: 2 of 10   Subjective: Symptoms/Limitations Symptoms: Pt stated compliant with HEP.  Pain scale 1-2/10 Rt knee today.   Pain Assessment Currently in Pain?: Yes Pain Score: 2  Pain Location: Knee Pain Orientation: Right  Precautions/Restrictions  Precautions Precautions: Fall;Knee  Exercise/Treatments Aerobic Stationary Bike: Nustep x 10 minutes hill level 2, resistnace level 3 Standing Heel Raises: 15 reps;Limitations Heel Raises Limitations: toe raises Knee Flexion: 15 reps Functional Squat: 10 reps Rocker Board: 2 minutes;Limitations Rocker Board Limitations: R/L with HHA Gait Training: no AD 4 RT to improve mechanics Supine Quad Sets: Right;10 reps Short Arc Quad Sets: Right;10 reps Heel Slides: Right;5 reps Terminal Knee Extension: Right;10 reps;Limitations Terminal Knee Extension Limitations: 5" holds Sidelying Hip ABduction: Right;10 reps;Limitations Hip ABduction Limitations: 2# Hip ADduction: Right;10 reps;Limitations Hip ADduction Limitations: 2# Prone  Hamstring Curl: 10 reps;Limitations Hamstring Curl Limitations: 2# Hip Extension: 10 reps;Limitations      Physical Therapy Assessment and Plan PT Assessment and Plan Clinical Impression Statement: Began OPPT session for Rt knee strengthening and improve gait mechanics with LRAD.  Pt did require cueing to improve stability and technique with exercises.  Able to add 2# with SLR followiong cueing for form.  Gait training with no AD with focus on increasing stride length to  normalize gait mechanics and reduce antalgic gait pattern.  Pt educed on techniques for edema control utilizing teach back method.  Pt plans to ice knee at home.   PT Plan: Continue with current POC for LE strengthening, improve gait mechanics with LRAD, and balance.  May want to refer for compression hose if edema not controlled.      Goals    Problem List Patient Active Problem List   Diagnosis Date Noted  . Stiffness of right knee 01/28/2013  . Weakness of right leg 01/28/2013  . Difficulty in walking(719.7) 01/28/2013  . Arthritis of knee, degenerative 12/13/2012    PT - End of Session Activity Tolerance: Patient tolerated treatment well General Behavior During Therapy: [facility] for tasks assessed/performed  GP    Juel Burrow 01/31/2013, 1:56 PM

## 2013-02-04 ENCOUNTER — Ambulatory Visit (HOSPITAL_COMMUNITY)
Admission: RE | Admit: 2013-02-04 | Discharge: 2013-02-04 | Disposition: A | Discharge status: Home / Self Care | Payer: Medicare Other | Source: Ambulatory Visit | Attending: Orthopedic Surgery | Admitting: Orthopedic Surgery

## 2013-02-04 NOTE — Progress Notes (Signed)
Physical Therapy Treatment Patient Details  Name: Cody Palmer MRN: [id number] Date of Birth: 10/17/1932  Today's Date: 02/04/2013 Time: 4098-1191 PT Time Calculation (min): 46 min  Visit#: 3 of 8  Re-eval: 02/27/13 Authorization: Medicare  Authorization Visit#: 3 of 10  Charges:  therex [id number] (33'), manual  [id number] (12')  Subjective: Symptoms/Limitations Symptoms: Pt states his Rt knee is just sore, no real pain.   Exercise/Treatments Aerobic Stationary Bike: Nustep x 10 minutes hill level 2, resistnace level 3 Standing Heel Raises: 15 reps;Limitations Heel Raises Limitations: toe raises Knee Flexion: 15 reps Rocker Board: 2 minutes;Limitations Rocker Board Limitations: R/L no UE assist Supine Quad Sets: Right;10 reps Short Arc Quad Sets: Right;10 reps Heel Slides: Right;5 reps Straight Leg Raises: 10 reps Sidelying Hip ABduction: Right;10 reps;Limitations Hip ABduction Limitations: 2# Hip ADduction: Right;10 reps;Limitations Hip ADduction Limitations: 2# Prone  Hamstring Curl: 10 reps;Limitations Hamstring Curl Limitations: 2# Hip Extension: 10 reps;Limitations   Manual Therapy Manual Therapy: Myofascial release Myofascial Release: Rt knee, entire perimeter  Physical Therapy Assessment and Plan PT Assessment and Plan Clinical Impression Statement: Continued to focus on strengthening today.  Rt knee ROM measured today at 0-120.  Added MFR techniques to perimeter of Rt knee due to fascial restrictions.  Urged patient to ice knee at home. PT Plan: Continue with current POC for LE strengthening, improve gait mechanics with LRAD, and balance.  May want to refer for compression hose if edema not controlled.          Problem List Patient Active Problem List   Diagnosis Date Noted  . Stiffness of right knee 01/28/2013  . Weakness of right leg 01/28/2013  . Difficulty in walking(719.7) 01/28/2013  . Arthritis of knee, degenerative 12/13/2012    PT -  End of Session Activity Tolerance: Patient tolerated treatment well General Behavior During Therapy: [facility] for tasks assessed/performed   Lurena Nida, PTA/CLT 02/04/2013, 2:13 PM

## 2013-02-07 ENCOUNTER — Ambulatory Visit (HOSPITAL_COMMUNITY)
Admission: RE | Admit: 2013-02-07 | Discharge: 2013-02-07 | Disposition: A | Discharge status: Home / Self Care | Payer: Medicare Other | Source: Ambulatory Visit | Attending: Family Medicine | Admitting: Family Medicine

## 2013-02-07 NOTE — Progress Notes (Signed)
Physical Therapy Treatment Patient Details  Name: Cody Palmer MRN: [id number] Date of Birth: 1932/11/18  Today's Date: 02/07/2013 Time: 4696-2952 PT Time Calculation (min): 55 min Visit#: 4 of 8  Re-eval: 02/27/13 Authorization: Medicare  Authorization Visit#: 4 of 10 Charges:  There ex [id number] (32'), manual [id number] (8'), icepack [id number] (10')  Subjective: Symptoms/Limitations Symptoms: Pt states his knee continues to be sore, 5/10 today.  REports treatment last visit really helped.   Exercise/Treatments Aerobic Stationary Bike: Nustep x 10 minutes hill level 2, resistnace level 3 Standing Heel Raises: 20 reps Heel Raises Limitations: toe raises 20 reps Knee Flexion: 15 reps;Right;Limitations Knee Flexion Limitations: 3# Functional Squat: 10 reps Rocker Board: 2 minutes;Limitations Rocker Board Limitations: R/L no UE assist Supine Short Arc Quad Sets: Right;10 reps;Limitations Short Arc Quad Sets Limitations: 3# Heel Slides: 10 reps Straight Leg Raises: 10 reps;Limitations Straight Leg Raises Limitations: 3# Sidelying Hip ABduction: 15 reps Hip ABduction Limitations: 3# Hip ADduction: 15 reps Hip ADduction Limitations: 3# Prone  Hamstring Curl: 15 reps Hamstring Curl Limitations: 3# Hip Extension: 15 reps   Modalities Modalities: Cryotherapy Manual Therapy Manual Therapy: Myofascial release Myofascial Release: Rt knee, entire perimeter Cryotherapy Number Minutes Cryotherapy: 10 Minutes Cryotherapy Location: Knee Type of Cryotherapy: Ice pack  Physical Therapy Assessment and Plan PT Assessment and Plan Clinical Impression Statement: Continued focus on strength and increasing stability in Rt LE.  MFR techniques helping to loosen adherent tissue around knee perimeter.  Added ice at end of session to decrease heat/swelling. PT Plan: Continue with current POC for LE strenghening.  Add SLS and step ups/downs.  Work on normalizing gait.        Problem  List Patient Active Problem List   Diagnosis Date Noted  . Stiffness of right knee 01/28/2013  . Weakness of right leg 01/28/2013  . Difficulty in walking(719.7) 01/28/2013  . Arthritis of knee, degenerative 12/13/2012    PT - End of Session Activity Tolerance: Patient tolerated treatment well General Behavior During Therapy: [facility] for tasks assessed/performed   Lurena Nida, PTA/CLT 02/07/2013, 1:53 PM

## 2013-02-11 ENCOUNTER — Ambulatory Visit (HOSPITAL_COMMUNITY)
Admission: RE | Admit: 2013-02-11 | Discharge: 2013-02-11 | Disposition: A | Discharge status: Home / Self Care | Payer: Medicare Other | Source: Ambulatory Visit | Attending: Family Medicine | Admitting: Family Medicine

## 2013-02-11 DIAGNOSIS — R29898 Other symptoms and signs involving the musculoskeletal system: Secondary | ICD-10-CM

## 2013-02-11 DIAGNOSIS — R262 Difficulty in walking, not elsewhere classified: Secondary | ICD-10-CM

## 2013-02-11 DIAGNOSIS — M25661 Stiffness of right knee, not elsewhere classified: Secondary | ICD-10-CM

## 2013-02-11 NOTE — Progress Notes (Signed)
Physical Therapy Treatment Patient Details  Name: Cody Palmer MRN: [id number] Date of Birth: 01-16-1933  Today's Date: 02/11/2013 Time: 6213-0865 PT Time Calculation (min): 54 min Charge: TE 7846-9629, Manual 1335-1345, Ice 5284-1324  Visit#: 5 of 8  Re-eval: 02/27/13 Assessment Diagnosis: Rt TKA Surgical Date: 01/11/13 Next MD Visit: Dr. Romeo Apple - 02/21/13 Prior Therapy: HHPT  Authorization: Medicare  Authorization Time Period:    Authorization Visit#: 5 of 10   Subjective: Symptoms/Limitations Symptoms: Pt stated he knows knee is getting better gradually, reported pain scale 3/10 today.  Pt reports great progress with his knee, began driving last week and ambuulating with no AD. Pain Assessment Currently in Pain?: Yes Pain Score: 3  Pain Location: Knee Pain Orientation: Right  Precautions/Restrictions  Precautions Precautions: Fall;Knee  Exercise/Treatments Aerobic Stationary Bike: Nustep x 10 minutes hill level 2, resistnace level 4, no HHA Standing Heel Raises: 20 reps Heel Raises Limitations: toe raises 20 reps Knee Flexion: 15 reps;Right;Limitations Knee Flexion Limitations: 3# Terminal Knee Extension: Right;10 reps;Theraband Theraband Level (Terminal Knee Extension): Level 4 (Blue) Lateral Step Up: Right;10 reps;Hand Hold: 1;Step Height: 4" Forward Step Up: Right;10 reps;Hand Hold: 0;Step Height: 4" Functional Squat: 10 reps;3 seconds Rocker Board: 2 minutes;Limitations Rocker Board Limitations: R/L no UE assist SLS: Lt 1x 30" with 1 finger A, Rt 2x 15" with 1 finger A Gait Training: no AD 4 RT to improve mechanics  Manual Therapy Manual Therapy: Myofascial release Myofascial Release: Rt knee entire perimeter Cryotherapy Number Minutes Cryotherapy: 10 Minutes Cryotherapy Location: Knee Type of Cryotherapy: Ice pack  Physical Therapy Assessment and Plan PT Assessment and Plan Clinical Impression Statement: Progressed  exercises for functional  strengthening.  Added standing TKE and educated pt on importance of knee extension with gait to improve mechanics.  Began stair training with vc-ing to improve technique for maximal quad strengthening and to reduce compensation.with hip hiking.  Continued manual techniques to reduce fascial restrictions.  Ice applied end of session for pain and edema control.   PT Plan: Continue with current POC for LE strenghening.  Continue with SLS and  begin step downs.  Work on normalizing gait.       Goals Home Exercise Program Pt/caregiver will Perform Home Exercise Program: Independently PT Short Term Goals Time to Complete Short Term Goals: 2 weeks PT Short Term Goal 1: Pt will improve his Rt knee AROM 0-115 for increased ease with sit to stand activities.  PT Short Term Goal 2: Pt will report pain less than 4/10 to his Rt knee for improved QOL.  PT Short Term Goal 3: Pt will improve his proprioceptive awareness and demonstrate Rt and Lt SLS x10 sec on solid surface in order to begin ambulating independently.  PT Short Term Goal 4: Pt will present without edema to Rt knee and verablize techniques to reduce edema. PT Long Term Goals Time to Complete Long Term Goals: 4 weeks PT Long Term Goal 1: Pt will improve his LE strength to [facility] In order to demonstrate independent ambulation x30 minutes  PT Long Term Goal 2: Pt will improve his Rt knee AROM to [facility] in order to ascend and descend 5 steps w/1 handrail with reciprocal pattern in order to safely enter his home.  Long Term Goal 3: Pt will improve his functional strength and demonstrate 5 STS in 12 seconds for normal age range. Long Term Goal 4: Pt will improve his ABC to greater than 80% for improved percieved functional ability   Problem List Patient  Active Problem List   Diagnosis Date Noted  . Stiffness of right knee 01/28/2013  . Weakness of right leg 01/28/2013  . Difficulty in walking(719.7) 01/28/2013  . Arthritis of knee, degenerative  12/13/2012    PT - End of Session Activity Tolerance: Patient tolerated treatment well General Behavior During Therapy: [facility] for tasks assessed/performed  GP    Juel Burrow 02/11/2013, 4:45 PM

## 2013-02-14 ENCOUNTER — Ambulatory Visit (HOSPITAL_COMMUNITY)
Admission: RE | Admit: 2013-02-14 | Discharge: 2013-02-14 | Disposition: A | Discharge status: Home / Self Care | Payer: Medicare Other | Source: Ambulatory Visit | Attending: Orthopedic Surgery | Admitting: Orthopedic Surgery

## 2013-02-14 DIAGNOSIS — M25669 Stiffness of unspecified knee, not elsewhere classified: Secondary | ICD-10-CM | POA: Insufficient documentation

## 2013-02-14 DIAGNOSIS — I10 Essential (primary) hypertension: Secondary | ICD-10-CM | POA: Insufficient documentation

## 2013-02-14 DIAGNOSIS — R29898 Other symptoms and signs involving the musculoskeletal system: Secondary | ICD-10-CM

## 2013-02-14 DIAGNOSIS — M25569 Pain in unspecified knee: Secondary | ICD-10-CM | POA: Insufficient documentation

## 2013-02-14 DIAGNOSIS — M25661 Stiffness of right knee, not elsewhere classified: Secondary | ICD-10-CM

## 2013-02-14 DIAGNOSIS — IMO0001 Reserved for inherently not codable concepts without codable children: Secondary | ICD-10-CM | POA: Insufficient documentation

## 2013-02-14 DIAGNOSIS — R262 Difficulty in walking, not elsewhere classified: Secondary | ICD-10-CM

## 2013-02-14 DIAGNOSIS — M6281 Muscle weakness (generalized): Secondary | ICD-10-CM | POA: Insufficient documentation

## 2013-02-14 NOTE — Progress Notes (Signed)
Physical Therapy Treatment Patient Details  Name: Cody Palmer MRN: [id number] Date of Birth: Apr 19, 1933  Today's Date: 02/14/2013 Time: 1307-1358 PT Time Calculation (min): 51 min Charges:  TE: 0865-7846 Manual: 9629-5284 Ice: 1 Visit#: 6 of 8  Re-eval: 02/27/13    Authorization: Medicare  Authorization Time Period:    Authorization Visit#: 6 of 10   Subjective: Symptoms/Limitations Symptoms: Pt reports that he conitnues to have soreness in his knee that he is frusturated with.  He is now walking independently indoors and outdoors.  Pain Assessment Currently in Pain?: Yes Pain Score: 5  Pain Location: Knee Pain Orientation: Right Pain Type: Surgical pain  Precautions/Restrictions     Exercise/Treatments Aerobic Stationary Bike: Bike 8 minutes seat 8 for AROM warm up Machines for Strengthening Cybex Knee Extension: 4 PL BLE x10 Cybex Knee Flexion: 6 PL BLE x10 Standing Lateral Step Up: Right;10 reps;Hand Hold: 1;Step Height: 4";Limitations Lateral Step Up Limitations: Hip HIkes 10 reps 3 sec holds wi/PT faciliation for proper motion Forward Step Up: Right;Hand Hold: 0;Step Height: 4";20 reps Step Down: Right;15 reps;Hand Hold: 1;Step Height: 4" Wall Squat: 10 reps;5 seconds;Limitations Wall Squat Limitations: PT faciliation for proper gluteal activiation Gait Training: no AD in parking lot x5 minutes Seated Stool Scoot - Round Trips: 3 on carpet  Manual Therapy Manual Therapy: Myofascial release Myofascial Release: Rt knee entire anterior knee to decrease fascial restrictions  Cryotherapy Number Minutes Cryotherapy: 10 Minutes Cryotherapy Location: Knee  Physical Therapy Assessment and Plan PT Assessment and Plan Clinical Impression Statement: Pt continues to do well with TE progression.  Has most notable difficulty with wall squats while extending and requires max cueing and mod A for 1x wall squat to use posterior musculature.   PT Plan: Continue with  current POC for LE strenghening.  Normalize gait.  Add foam for standing balance activites.     Goals    Problem List Patient Active Problem List   Diagnosis Date Noted  . Stiffness of right knee 01/28/2013  . Weakness of right leg 01/28/2013  . Difficulty in walking(719.7) 01/28/2013  . Arthritis of knee, degenerative 12/13/2012    PT - End of Session Activity Tolerance: Patient tolerated treatment well General Behavior During Therapy: [facility] for tasks assessed/performed  GP    Mauricia Mertens 02/14/2013, 1:51 PM

## 2013-02-18 ENCOUNTER — Ambulatory Visit (HOSPITAL_COMMUNITY)
Admission: RE | Admit: 2013-02-18 | Discharge: 2013-02-18 | Disposition: A | Discharge status: Home / Self Care | Payer: Medicare Other | Source: Ambulatory Visit | Attending: Family Medicine | Admitting: Family Medicine

## 2013-02-18 DIAGNOSIS — R29898 Other symptoms and signs involving the musculoskeletal system: Secondary | ICD-10-CM

## 2013-02-18 DIAGNOSIS — M25661 Stiffness of right knee, not elsewhere classified: Secondary | ICD-10-CM

## 2013-02-18 DIAGNOSIS — R262 Difficulty in walking, not elsewhere classified: Secondary | ICD-10-CM

## 2013-02-18 NOTE — Progress Notes (Signed)
Physical Therapy Treatment Patient Details  Name: Cody Palmer MRN: [id number] Date of Birth: 07-03-1932  Today's Date: 02/18/2013 Time: 4098-1191 PT Time Calculation (min): 50 min Charges:  TE: 4782-9562 Manual: 1308-6578 Ice: 1 Visit#: 7 of 8  Re-eval: 02/27/13 Assessment Diagnosis: Rt TKA Surgical Date: 01/11/13 Next MD Visit: Dr. Romeo Apple - 02/21/13 Prior Therapy: HHPT  Authorization: Medicare  Authorization Time Period:    Authorization Visit#: 7 of 10   Subjective: Symptoms/Limitations Symptoms: Pt reports that he took his bike out on Saturday and tried to ride it. States it made is calf a little sore.  Pain Assessment Currently in Pain?: Yes Pain Score: 5  Pain Location: Knee  Precautions/Restrictions     Exercise/Treatments Stretches Gastroc Stretch: 3 reps;30 seconds Aerobic Stationary Bike: Bike 10 minutes seat 8 for AROM warm up Machines for Strengthening Cybex Knee Extension: 4 PL Rt ecc lowering x15 Cybex Knee Flexion: 6 PL BLE x20 Standing Lateral Step Up: Right;Limitations;15 reps;Step Height: 6";Hand Hold: 0 Forward Step Up: Right;Hand Hold: 0;Step Height: 4";20 reps;Step Height: 6" Wall Squat: 10 reps;5 seconds;Limitations Wall Squat Limitations: PT faciliation for proper gluteal activiation Other Standing Knee Exercises: Hamstring Bridges: 10 reps, roll ups x5 on Green ball Sidelying Hip ABduction: 2 sets;20 reps;Limitations Hip ABduction Limitations: quick Other Sidelying Knee Exercises: Hip circles clockwise, counter clockwise 2x10   Manual Therapy Manual Therapy: Myofascial release Myofascial Release: Rt knee entire anterior knee to decrease fascial restrictions  Cryotherapy Cryotherapy Location: Knee  Physical Therapy Assessment and Plan PT Assessment and Plan Clinical Impression Statement: Continued to progress activities to improve gluteal and eccentric quadricep strength and to improve fast twitch hip musculature.  Encouraged pt  to decrease gait speed in order to improve gait mechanics.  PT Plan: Re-eval for MD apt.      Goals Home Exercise Program Pt/caregiver will Perform Home Exercise Program: Independently PT Goal: Perform Home Exercise Program - Progress: Met PT Short Term Goals Time to Complete Short Term Goals: 2 weeks PT Short Term Goal 1: Pt will improve his Rt knee AROM 0-115 for increased ease with sit to stand activities.  PT Short Term Goal 1 - Progress: Progressing toward goal PT Short Term Goal 2: Pt will report pain less than 4/10 to his Rt knee for improved QOL.  PT Short Term Goal 2 - Progress: Progressing toward goal PT Short Term Goal 3: Pt will improve his proprioceptive awareness and demonstrate Rt and Lt SLS x10 sec on solid surface in order to begin ambulating independently.  PT Short Term Goal 3 - Progress: Progressing toward goal PT Short Term Goal 4: Pt will present without edema to Rt knee and verablize techniques to reduce edema. PT Short Term Goal 4 - Progress: Progressing toward goal PT Long Term Goals Time to Complete Long Term Goals: 4 weeks PT Long Term Goal 1: Pt will improve his LE strength to [facility] In order to demonstrate independent ambulation x30 minutes  PT Long Term Goal 1 - Progress: Progressing toward goal PT Long Term Goal 2: Pt will improve his Rt knee AROM to [facility] in order to ascend and descend 5 steps w/1 handrail with reciprocal pattern in order to safely enter his home.  PT Long Term Goal 2 - Progress: Progressing toward goal Long Term Goal 3: Pt will improve his functional strength and demonstrate 5 STS in 12 seconds for normal age range. Long Term Goal 3 Progress: Progressing toward goal Long Term Goal 4: Pt will improve his ABC  to greater than 80% for improved percieved functional ability  Long Term Goal 4 Progress: Progressing toward goal  Problem List Patient Active Problem List   Diagnosis Date Noted  . Stiffness of right knee 01/28/2013  . Weakness of right  leg 01/28/2013  . Difficulty in walking(719.7) 01/28/2013  . Arthritis of knee, degenerative 12/13/2012    PT - End of Session Activity Tolerance: Patient tolerated treatment well General Behavior During Therapy: [facility] for tasks assessed/performed  GP    Carron Mcmurry 02/18/2013, 1:46 PM

## 2013-02-21 ENCOUNTER — Ambulatory Visit (INDEPENDENT_AMBULATORY_CARE_PROVIDER_SITE_OTHER): Payer: Self-pay | Admitting: Orthopedic Surgery

## 2013-02-21 ENCOUNTER — Encounter: Payer: Self-pay | Admitting: Orthopedic Surgery

## 2013-02-21 ENCOUNTER — Ambulatory Visit (HOSPITAL_COMMUNITY)
Admission: RE | Admit: 2013-02-21 | Discharge: 2013-02-21 | Disposition: A | Discharge status: Home / Self Care | Payer: Medicare Other | Source: Ambulatory Visit | Attending: Family Medicine | Admitting: Family Medicine

## 2013-02-21 VITALS — BP 133/65 | Ht 70.0 in | Wt 210.0 lb

## 2013-02-21 DIAGNOSIS — Z96659 Presence of unspecified artificial knee joint: Secondary | ICD-10-CM | POA: Insufficient documentation

## 2013-02-21 DIAGNOSIS — R262 Difficulty in walking, not elsewhere classified: Secondary | ICD-10-CM

## 2013-02-21 DIAGNOSIS — M25661 Stiffness of right knee, not elsewhere classified: Secondary | ICD-10-CM

## 2013-02-21 DIAGNOSIS — R29898 Other symptoms and signs involving the musculoskeletal system: Secondary | ICD-10-CM

## 2013-02-21 DIAGNOSIS — Z96651 Presence of right artificial knee joint: Secondary | ICD-10-CM

## 2013-02-21 MED ORDER — HYDROCODONE-ACETAMINOPHEN 7.5-325 MG PO TABS: 1.0000 | ORAL_TABLET | ORAL | Status: DC

## 2013-02-21 NOTE — Evaluation (Signed)
Physical Therapy Re-Evaluation  Patient Details  Name: Cody Palmer MRN: [id number] Date of Birth: [birth year]  Today's Date: 02/21/2013 Time: 4098-1191 PT Time Calculation (min): 50 min Charges: 1 ROM/MMT Sefl Care: 4782-9562 Ice: 1308-6578             Visit#: 8 of 12  Re-eval: 02/27/13 Assessment Diagnosis: Rt TKA Surgical Date: 01/11/13 Next MD Visit: Dr. Romeo Apple - 02/21/13 Prior Therapy: HHPT  Authorization: Medicare    Authorization Time Period:    Authorization Visit#: 8 of 18   Subjective Symptoms/Limitations Symptoms: Pt continues to have soreness and stiffness to his knee.  He reports that he still has swelling.  He has used his bike at home 1x.  Pain Assessment Currently in Pain?: Yes Pain Score: 5  Pain Location: Knee Pain Orientation: Right  RLE AROM (degrees) Right Knee Extension: 0 Right Knee Flexion: 130 RLE Strength Right Hip Flexion: 4/5 (was 4/5) Right Hip Extension: 5/5 (was 3+/5) Right Hip ABduction: 5/5 (was 3+/5) Right Hip ADduction: 5/5 (was 4/5)  Mobility/Balance  Ambulation/Gait Ambulation/Gait Assistance: 7: Independent Assistive device: None (was RW) Gait Pattern: Antalgic Static Standing Balance Single Leg Stance - Right Leg: 20 (was 3) Single Leg Stance - Left Leg: 5 (was 3)   Exercise/Treatments Cryotherapy Number Minutes Cryotherapy: 10 Minutes Cryotherapy Location: Knee Type of Cryotherapy: Ice pack  Physical Therapy Assessment and Plan PT Assessment and Plan Clinical Impression Statement: Mr. Alto Denver has attended 8 OP PT visits with following findings:Rt knee AROM 0-120, strength 5/5 thorughout, improved RLE balance and RLE coordinated movementscontinues to have greatest limitation with pain and stiffness to RLE and impaired percieved functional ability when discussing BLE function.  Wound continue to benefit x2 weeks to address stiffness and soreness to knee to decrease fascial restrictions.  Pt will benefit from skilled  therapeutic intervention in order to improve on the following deficits: Pain;Impaired perceived functional ability;Increased fascial restricitons PT Frequency: Min 2X/week PT Duration:  (2 weeks) PT Plan: With MD approval, continue with manual techniques to address stiffness, add BOSU and Airex activities to improve balance for return to golf, add sports cord.     Goals Home Exercise Program Pt/caregiver will Perform Home Exercise Program: Independently PT Goal: Perform Home Exercise Program - Progress: Met PT Short Term Goals Time to Complete Short Term Goals: 2 weeks PT Short Term Goal 1: Pt will improve his Rt knee AROM 0-115 for increased ease with sit to stand activities.  PT Short Term Goal 1 - Progress: Met PT Short Term Goal 2: Pt will report pain less than 4/10 to his Rt knee for improved QOL.  PT Short Term Goal 2 - Progress: Progressing toward goal PT Short Term Goal 3: Pt will improve his proprioceptive awareness and demonstrate Rt and Lt SLS x10 sec on solid surface in order to begin ambulating independently.  PT Short Term Goal 3 - Progress: Met (20 seconds) PT Short Term Goal 4: Pt will present without edema to Rt knee and verablize techniques to reduce edema. PT Short Term Goal 4 - Progress: Met PT Long Term Goals Time to Complete Long Term Goals: 4 weeks PT Long Term Goal 1: Pt will improve his LE strength to [facility] In order to demonstrate independent ambulation x30 minutes  PT Long Term Goal 1 - Progress: Met PT Long Term Goal 2: Pt will improve his Rt knee AROM to [facility] in order to ascend and descend 5 steps w/1 handrail with reciprocal pattern in order to safely enter  his home.  PT Long Term Goal 2 - Progress: Progressing toward goal Long Term Goal 3: Pt will improve his functional strength and demonstrate 5 STS in 12 seconds for normal age range. Long Term Goal 3 Progress: Progressing toward goal Long Term Goal 4: Pt will improve his ABC to greater than 80% for improved  percieved functional ability  (90%) Long Term Goal 4 Progress: Met  Problem List Patient Active Problem List   Diagnosis Date Noted  . Stiffness of right knee 01/28/2013  . Weakness of right leg 01/28/2013  . Difficulty in walking(719.7) 01/28/2013  . Arthritis of knee, degenerative 12/13/2012    PT - End of Session Activity Tolerance: Patient tolerated treatment well General Behavior During Therapy: WFL for tasks assessed/performed PT Plan of Care PT Patient Instructions: discussed functions at work with pt demonstration of half kneeling with with kneeling pad; discussed common stiffness and soreness after TKR, discussed necessary functional strength and balance for return to golf and work activities.   GP Functional Assessment Tool Used: LEFS: 41/80, ABC: 90% Functional Limitation: Mobility: Walking and moving around Mobility: Walking and Moving Around Current Status (V7846): At least 1 percent but less than 20 percent impaired, limited or restricted Mobility: Walking and Moving Around Goal Status [phone]): At least 1 percent but less than 20 percent impaired, limited or restricted  Chelbi Herber, MPT, ATC 02/21/2013, 1:54 PM  Physician Documentation Your signature is required to indicate approval of the treatment plan as stated above.  Please sign and either send electronically or make a copy of this report for your files and return this physician signed original.   Please mark one 1.__approve of plan  2. ___approve of plan with the following conditions.   ______________________________                                                          _____________________ Physician Signature                                                                                                             Date

## 2013-02-21 NOTE — Progress Notes (Signed)
Patient ID: Cody Palmer, male   DOB: 03-04-1933, 79 y.o.   MRN: [id number]  Chief Complaint  Patient presents with  . Follow-up    4 week post op recheck on right TKA. DOS 01-11-13.    6 weeks post surgery doing well complains of soreness in his knee and occasional swelling  He is ambulating as a leg length discrepancy now with his opposite knee is still in varus it measured and a quarter-inch to level his pelvis  This was done the block test  His knee flexion is 115 knee extension full quadriceps strength is normal  Recommend continue home exercises, quarter inch heel lift in the opposite leg followup in 6 weeks for 12 weeks postop visit

## 2013-04-04 ENCOUNTER — Ambulatory Visit (INDEPENDENT_AMBULATORY_CARE_PROVIDER_SITE_OTHER): Payer: Self-pay | Admitting: Orthopedic Surgery

## 2013-04-04 ENCOUNTER — Encounter: Payer: Self-pay | Admitting: Orthopedic Surgery

## 2013-04-04 VITALS — BP 140/76 | Ht 70.0 in | Wt 210.0 lb

## 2013-04-04 DIAGNOSIS — Z96659 Presence of unspecified artificial knee joint: Secondary | ICD-10-CM

## 2013-04-04 DIAGNOSIS — Z96651 Presence of right artificial knee joint: Secondary | ICD-10-CM

## 2013-04-04 NOTE — Progress Notes (Addendum)
Patient ID: Cody Palmer, male   DOB: [birth year], 79 y.o.   MRN: [id number]  Chief Complaint  Patient presents with  . Follow-up    6 week recheck right TKA DOS 01/11/13    Postop week #12 status post right total knee doing well without any complications or problems . He does have swelling at the end of the day relieved by ice Review of systems is normal  BP 140/76  Ht 5\' 10"  (1.778 m)  Wt 210 lb (95.255 kg)  BMI 30.13 kg/m2 General appearance is normal, the patient is alert and oriented x3 with normal mood and affect.  He walks without support, he has no pain in his knee; not taking any pain medication; his knee flexion is 120; he has some extensor lag with full extension passively  Continue activities as tolerated increase activities as tolerated followup in 3 months

## 2013-07-04 ENCOUNTER — Encounter (HOSPITAL_COMMUNITY): Payer: Self-pay | Admitting: Emergency Medicine

## 2013-07-04 ENCOUNTER — Emergency Department (HOSPITAL_COMMUNITY)
Admission: EM | Admit: 2013-07-04 | Discharge: 2013-07-04 | Disposition: A | Discharge status: Home / Self Care | Payer: Medicare Other | Attending: Emergency Medicine | Admitting: Emergency Medicine

## 2013-07-04 DIAGNOSIS — Z7982 Long term (current) use of aspirin: Secondary | ICD-10-CM | POA: Insufficient documentation

## 2013-07-04 DIAGNOSIS — E7800 Pure hypercholesterolemia, unspecified: Secondary | ICD-10-CM | POA: Insufficient documentation

## 2013-07-04 DIAGNOSIS — N390 Urinary tract infection, site not specified: Secondary | ICD-10-CM | POA: Insufficient documentation

## 2013-07-04 DIAGNOSIS — R319 Hematuria, unspecified: Secondary | ICD-10-CM

## 2013-07-04 DIAGNOSIS — Z79899 Other long term (current) drug therapy: Secondary | ICD-10-CM | POA: Insufficient documentation

## 2013-07-04 DIAGNOSIS — Z87891 Personal history of nicotine dependence: Secondary | ICD-10-CM | POA: Insufficient documentation

## 2013-07-04 DIAGNOSIS — E669 Obesity, unspecified: Secondary | ICD-10-CM | POA: Insufficient documentation

## 2013-07-04 DIAGNOSIS — Z792 Long term (current) use of antibiotics: Secondary | ICD-10-CM | POA: Insufficient documentation

## 2013-07-04 DIAGNOSIS — I10 Essential (primary) hypertension: Secondary | ICD-10-CM | POA: Insufficient documentation

## 2013-07-04 DIAGNOSIS — Z791 Long term (current) use of non-steroidal anti-inflammatories (NSAID): Secondary | ICD-10-CM | POA: Insufficient documentation

## 2013-07-04 DIAGNOSIS — M129 Arthropathy, unspecified: Secondary | ICD-10-CM | POA: Insufficient documentation

## 2013-07-04 LAB — CBC WITH DIFFERENTIAL/PLATELET
BASOS ABS: 0 10*3/uL (ref 0.0–0.1)
Basophils Relative: 1 % (ref 0–1)
EOS ABS: 0.4 10*3/uL (ref 0.0–0.7)
EOS PCT: 4 % (ref 0–5)
HCT: 39.6 % (ref 39.0–52.0)
Hemoglobin: 13.4 g/dL (ref 13.0–17.0)
LYMPHS ABS: 1.6 10*3/uL (ref 0.7–4.0)
LYMPHS PCT: 19 % (ref 12–46)
MCH: 28.7 pg (ref 26.0–34.0)
MCHC: 33.8 g/dL (ref 30.0–36.0)
MCV: 84.8 fL (ref 78.0–100.0)
Monocytes Absolute: 0.9 10*3/uL (ref 0.1–1.0)
Monocytes Relative: 10 % (ref 3–12)
NEUTROS PCT: 66 % (ref 43–77)
Neutro Abs: 5.9 10*3/uL (ref 1.7–7.7)
PLATELETS: 201 10*3/uL (ref 150–400)
RBC: 4.67 MIL/uL (ref 4.22–5.81)
RDW: 14.8 % (ref 11.5–15.5)
WBC: 8.8 10*3/uL (ref 4.0–10.5)

## 2013-07-04 LAB — URINALYSIS, ROUTINE W REFLEX MICROSCOPIC
Bilirubin Urine: NEGATIVE
Glucose, UA: NEGATIVE mg/dL
NITRITE: POSITIVE — AB
SPECIFIC GRAVITY, URINE: 1.02 (ref 1.005–1.030)
UROBILINOGEN UA: 2 mg/dL — AB (ref 0.0–1.0)
pH: 7.5 (ref 5.0–8.0)

## 2013-07-04 LAB — COMPREHENSIVE METABOLIC PANEL
ALK PHOS: 79 U/L (ref 39–117)
ALT: 20 U/L (ref 0–53)
AST: 24 U/L (ref 0–37)
Albumin: 4.2 g/dL (ref 3.5–5.2)
BUN: 21 mg/dL (ref 6–23)
CALCIUM: 9.3 mg/dL (ref 8.4–10.5)
CO2: 29 mEq/L (ref 19–32)
Chloride: 100 mEq/L (ref 96–112)
Creatinine, Ser: 1.02 mg/dL (ref 0.50–1.35)
GFR calc non Af Amer: 68 mL/min — ABNORMAL LOW (ref >90)
GFR, EST AFRICAN AMERICAN: 79 mL/min — AB (ref >90)
GLUCOSE: 180 mg/dL — AB (ref 70–99)
POTASSIUM: 4.3 meq/L (ref 3.7–5.3)
SODIUM: 139 meq/L (ref 137–147)
TOTAL PROTEIN: 7.3 g/dL (ref 6.0–8.3)
Total Bilirubin: 0.4 mg/dL (ref 0.3–1.2)

## 2013-07-04 LAB — URINE MICROSCOPIC-ADD ON

## 2013-07-04 MED ORDER — SULFAMETHOXAZOLE-TMP DS 800-160 MG PO TABS: 1.0000 | ORAL_TABLET | Freq: Two times a day (BID) | ORAL | Status: DC

## 2013-07-04 NOTE — ED Notes (Signed)
PT C/O BRIGHT RED BLOOD IN URINE X 45 MINS AGO. DENIES ANY PAIN.

## 2013-07-04 NOTE — ED Notes (Signed)
MD at bedside. 

## 2013-07-04 NOTE — ED Provider Notes (Signed)
CSN: [id number]     Arrival date & time 07/04/13  1851 History  This chart was scribed for [employer]. Alvino Chapel, MD by Elby Beck, ED Scribe. This patient was seen in room [facility]07/[facility]07 and the patient's care was started at 7:25 PM.   Chief Complaint  Patient presents with  . Hematuria    The history is provided by the patient. No language interpreter was used.    HPI Comments: Cody Palmer is a 80 y.o. male who presents to the Emergency Department complaining of hematuria over the past hour. He states that he had an episode of bright red blood in his urine. He states that he had a normal urination, without blood, about 1 hour prior to this episode. He reports that he has no prior history of hematuria. He denies any associated abdominal pain, flank pain or any other pain. He states that she has not been having any other bleeding. He states that he is not on any blood thinners, although he does take 81 mg ASA daily. He denies any fever, and he denies any other urinary symptoms including urgency, frequency or dysuria. He states that he has no history of kidney stones.    Past Medical History  Diagnosis Date  . HTN (hypertension)   . Hypercholesteremia   . Arthritis    Past Surgical History  Procedure Laterality Date  . Eye surgery  1970 's    removal of eye  . 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 Arthritis    . Cancer    . Diabetes     History  Substance Use Topics  . Smoking status: Former Smoker -- 1.00 packs/day for 8 years    Quit date: 01/07/1965  . Smokeless tobacco: Not on file  . Alcohol Use: No    Review of Systems  Constitutional: Negative for fever.  Gastrointestinal: Negative for abdominal pain.  Genitourinary: Positive for hematuria. Negative for dysuria, urgency,  frequency and flank pain.  All other systems reviewed and are negative.   Allergies  Review of patient's allergies indicates no known allergies.  Home Medications   Current Outpatient Rx  Name  Route  Sig  Dispense  Refill  . amLODipine (NORVASC) 10 MG tablet   Oral   Take 10 mg by mouth daily.         Marland Kitchen aspirin EC 81 MG tablet   Oral   Take 81 mg by mouth daily.         Marland Kitchen labetalol (NORMODYNE) 200 MG tablet   Oral   Take 200 mg by mouth 2 (two) times daily.         Marland Kitchen losartan-hydrochlorothiazide (HYZAAR) 100-25 MG per tablet   Oral   Take 1 tablet by mouth daily.         . naproxen (NAPROSYN) 500 MG tablet   Oral   Take 500 mg by mouth daily.         . rosuvastatin (CRESTOR) 10 MG tablet   Oral   Take 10 mg by mouth daily.         Marland Kitchen sulfamethoxazole-trimethoprim (BACTRIM DS) 800-160 MG per tablet   Oral   Take 1 tablet by mouth 2 (two) times daily.   14 tablet   0    Triage Vitals:  BP 161/75  Pulse 85  Temp(Src) 98.1 F (36.7 C)  Resp 20  Ht 5\' 10"  (1.778 m)  Wt 210 lb (95.255 kg)  BMI 30.13 kg/m2  SpO2 97%  Physical Exam  Nursing note and vitals reviewed. Constitutional: He is oriented to person, place, and time. He appears well-developed and well-nourished. No distress.  HENT:  Head: Normocephalic and atraumatic.  Eyes: EOM are normal.  Neck: Neck supple. No tracheal deviation present.  Cardiovascular: Normal rate.   Pulmonary/Chest: Effort normal. No respiratory distress.  Abdominal: Soft. There is no tenderness.  Obese.  Genitourinary:  Uncircumcised. Small amount of blood at the meatus. No petechiae, no ecchymosis. No CVA tenderness.  Musculoskeletal: Normal range of motion.  Neurological: He is alert and oriented to person, place, and time.  Skin: Skin is warm and dry.  Psychiatric: He has a normal mood and affect. His behavior is normal.    ED Course  Procedures (including critical care time)  DIAGNOSTIC STUDIES: Oxygen  Saturation is 97% on RA, normal by my interpretation.    COORDINATION OF CARE: 7:31 PM- Discussed plan to obtain UA. Also advised pt of the need to follow-up with a Urologist. Pt advised of plan for treatment and pt agrees.  Labs Review Labs Reviewed  URINALYSIS, ROUTINE W REFLEX MICROSCOPIC - Abnormal; Notable for the following:    Color, Urine RED (*)    APPearance CLOUDY (*)    Hgb urine dipstick LARGE (*)    Ketones, ur TRACE (*)    Protein, ur >300 (*)    Urobilinogen, UA 2.0 (*)    Nitrite POSITIVE (*)    Leukocytes, UA MODERATE (*)    All other components within normal limits  COMPREHENSIVE METABOLIC PANEL - Abnormal; Notable for the following:    Glucose, Bld 180 (*)    GFR calc non Af Amer 68 (*)    GFR calc Af Amer 79 (*)    All other components within normal limits  URINE MICROSCOPIC-ADD ON - Abnormal; Notable for the following:    Bacteria, UA MANY (*)    All other components within normal limits  URINE CULTURE  CBC WITH DIFFERENTIAL   Imaging Review No results found.  EKG Interpretation   None       MDM   Final diagnoses:  Hematuria  UTI (urinary tract infection)    Patient with hematuria. Painless. Possible UTI. Will treat with antibiotics and will followup with urology.   I personally performed the services described in this documentation, which was scribed in my presence. The recorded information has been reviewed and is accurate.     Jasper Riling. Alvino Chapel, MD 07/04/13 2135

## 2013-07-04 NOTE — Discharge Instructions (Signed)
Hematuria, Adult °Hematuria is blood in your urine. It can be caused by a bladder infection, kidney infection, prostate infection, kidney stone, or cancer of your urinary tract. Infections can usually be treated with medicine, and a kidney stone usually will pass through your urine. If neither of these is the cause of your hematuria, further workup to find out the reason may be needed. °It is very important that you tell your health care provider about any blood you see in your urine, even if the blood stops without treatment or happens without causing pain. Blood in your urine that happens and then stops and then happens again can be a symptom of a very serious condition. Also, pain is not a symptom in the initial stages of many urinary cancers. °HOME CARE INSTRUCTIONS  °· Drink lots of fluid, 3 4 quarts a day. If you have been diagnosed with an infection, cranberry juice is especially recommended, in addition to large amounts of water. °· Avoid caffeine, tea, and carbonated beverages, because they tend to irritate the bladder. °· Avoid alcohol because it may irritate the prostate. °· Only take over-the-counter or prescription medicines for pain, discomfort, or fever as directed by your health care provider. °· If you have been diagnosed with a kidney stone, follow your health care provider's instructions regarding straining your urine to catch the stone. °· Empty your bladder often. Avoid holding urine for long periods of time. °· After a bowel movement, women should cleanse front to back. Use each tissue only once. °· Empty your bladder before and after sexual intercourse if you are a male. °SEEK MEDICAL CARE IF: °You develop back pain, fever, a feeling of sickness in your stomach (nausea), or vomiting or if your symptoms are not better in 3 days. Return sooner if you are getting worse. °SEEK IMMEDIATE MEDICAL CARE IF:  °· You have a persistent fever, with a temperature of 101.8°F (38.8°C) or greater. °· You  develop severe vomiting and are unable to keep the medicine down. °· You develop severe back or abdominal pain despite taking your medicines. °· You begin passing a large amount of blood or clots in your urine. °· You feel extremely weak or faint, or you pass out. °MAKE SURE YOU:  °· Understand these instructions. °· Will watch your condition. °· Will get help right away if you are not doing well or get worse. °Document Released: 05/02/2005 Document Revised: 02/20/2013 Document Reviewed: 12/31/2012 °[employer]® Patient Information ©2014 [employer]. ° °

## 2013-07-06 LAB — URINE CULTURE

## 2013-07-09 ENCOUNTER — Ambulatory Visit: Payer: Medicare Other | Admitting: Urology

## 2013-07-09 ENCOUNTER — Ambulatory Visit: Payer: Medicare Other | Admitting: Orthopedic Surgery

## 2013-07-16 ENCOUNTER — Ambulatory Visit (INDEPENDENT_AMBULATORY_CARE_PROVIDER_SITE_OTHER): Payer: Medicare Other | Admitting: Urology

## 2013-07-16 ENCOUNTER — Other Ambulatory Visit: Payer: Self-pay | Admitting: Urology

## 2013-07-16 DIAGNOSIS — R310 Gross hematuria: Secondary | ICD-10-CM

## 2013-07-16 DIAGNOSIS — R3129 Other microscopic hematuria: Secondary | ICD-10-CM

## 2013-07-16 DIAGNOSIS — K4090 Unilateral inguinal hernia, without obstruction or gangrene, not specified as recurrent: Secondary | ICD-10-CM

## 2013-07-19 ENCOUNTER — Ambulatory Visit (HOSPITAL_COMMUNITY)
Admission: RE | Admit: 2013-07-19 | Discharge: 2013-07-19 | Disposition: A | Discharge status: Home / Self Care | Payer: Medicare Other | Source: Ambulatory Visit | Attending: Urology | Admitting: Urology

## 2013-07-19 DIAGNOSIS — K5730 Diverticulosis of large intestine without perforation or abscess without bleeding: Secondary | ICD-10-CM | POA: Insufficient documentation

## 2013-07-19 DIAGNOSIS — R3129 Other microscopic hematuria: Secondary | ICD-10-CM

## 2013-07-19 DIAGNOSIS — R319 Hematuria, unspecified: Secondary | ICD-10-CM | POA: Insufficient documentation

## 2013-07-19 MED ORDER — IOHEXOL 300 MG/ML  SOLN
125.0000 mL | Freq: Once | INTRAMUSCULAR | Status: AC | PRN
  Administered 2013-07-19: 125 mL via INTRAVENOUS

## 2013-07-23 ENCOUNTER — Ambulatory Visit (INDEPENDENT_AMBULATORY_CARE_PROVIDER_SITE_OTHER): Payer: Medicare Other | Admitting: Urology

## 2013-07-23 DIAGNOSIS — R3129 Other microscopic hematuria: Secondary | ICD-10-CM

## 2013-07-23 DIAGNOSIS — N400 Enlarged prostate without lower urinary tract symptoms: Secondary | ICD-10-CM

## 2013-07-23 DIAGNOSIS — N323 Diverticulum of bladder: Secondary | ICD-10-CM

## 2013-07-25 ENCOUNTER — Other Ambulatory Visit: Payer: Self-pay | Admitting: Urology

## 2013-07-29 ENCOUNTER — Encounter (HOSPITAL_COMMUNITY): Payer: Self-pay | Admitting: Pharmacy Technician

## 2013-07-30 ENCOUNTER — Encounter: Payer: Self-pay | Admitting: Orthopedic Surgery

## 2013-07-30 ENCOUNTER — Ambulatory Visit (INDEPENDENT_AMBULATORY_CARE_PROVIDER_SITE_OTHER): Payer: Medicare Other | Admitting: Orthopedic Surgery

## 2013-07-30 VITALS — BP 140/68 | Ht 70.0 in | Wt 210.0 lb

## 2013-07-30 DIAGNOSIS — Z96659 Presence of unspecified artificial knee joint: Secondary | ICD-10-CM

## 2013-07-30 NOTE — Patient Instructions (Signed)
activities as tolerated 

## 2013-07-30 NOTE — Progress Notes (Signed)
Patient ID: KRIS NO, male   DOB: December 16, 1932, 80 y.o.   MRN: [id number]  Chief Complaint  Patient presents with  . 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

## 2013-08-08 ENCOUNTER — Encounter (HOSPITAL_COMMUNITY)
Admission: RE | Admit: 2013-08-08 | Discharge: 2013-08-08 | Disposition: A | Discharge status: Home / Self Care | Payer: Medicare Other | Source: Ambulatory Visit | Attending: Urology | Admitting: Urology

## 2013-08-08 ENCOUNTER — Encounter (HOSPITAL_COMMUNITY): Payer: Self-pay

## 2013-08-08 DIAGNOSIS — Z01812 Encounter for preprocedural laboratory examination: Secondary | ICD-10-CM | POA: Insufficient documentation

## 2013-08-08 NOTE — Pre-Procedure Instructions (Signed)
Patient does not use computer.

## 2013-08-08 NOTE — Patient Instructions (Signed)
Your procedure is scheduled on:  08/13/13  Report to [facility] at 11:30 AM  Call this number if you have problems the morning of surgery: [phone]   Remember:   Do not eat food or drink liquids after midnight.   Take these medicines the morning of surgery with A SIP OF WATER: Amlodipine, Labetalol and Losartan-HCTZ   Do not wear jewelry, make-up or nail polish.  Do not wear lotions, powders, or perfumes.   Do not shave 48 hours prior to surgery. Men may shave face and neck.  Do not bring valuables to the hospital.  [facility] is not responsible for any belongings or valuables.               Contacts, dentures or bridgework may not be worn into surgery.  Leave suitcase in the car. After surgery it may be brought to your room.  For patients admitted to the hospital, discharge time is determined by your treatment team.               Patients discharged the day of surgery will not be allowed to drive home.    Special Instructions: Shower using CHG 1 night before surgery and the morning before surgery.  Use special wash - you have one bottle of CHG for both showers.  You should use approximately 1/2 of the bottle for each shower.   Please read over the following fact sheets that you were given: Anesthesia Post-op Instructions and Care and Recovery After Surgery     Cystoscopy Cystoscopy is a procedure that is used to help your caregiver diagnose and sometimes treat conditions that affect your lower urinary tract. Your lower urinary tract includes your bladder and the tube through which urine passes from your bladder out of your body (urethra). Cystoscopy is performed with a thin, tube-shaped instrument (cystoscope). The cystoscope has lenses and a light at the end so that your caregiver can see inside your bladder. The cystoscope is inserted at the entrance of your urethra. Your caregiver guides it through your urethra and into your bladder. There are two main types of  cystoscopy:  Flexible cystoscopy (with a flexible cystoscope).  Rigid cystoscopy (with a rigid cystoscope). Cystoscopy may be recommended for many conditions, including:  Urinary tract infections.  Blood in your urine (hematuria).  Loss of bladder control (urinary incontinence) or overactive bladder.  Unusual cells found in a urine sample.  Urinary blockage.  Painful urination. Cystoscopy may also be done to remove a sample of your tissue to be checked under a microscope (biopsy). It may also be done to remove or destroy bladder stones. LET YOUR CAREGIVER KNOW ABOUT:  Allergies to food or medicine.  Medicines taken, including vitamins, herbs, eyedrops, over-the-counter medicines, and creams.  Use of steroids (by mouth or creams).  Previous problems with anesthetics or numbing medicines.  History of bleeding problems or blood clots.  Previous surgery.  Other health problems, including diabetes and kidney problems.  Possibility of pregnancy, if this applies. PROCEDURE The area around the opening to your urethra will be cleaned. A medicine to numb your urethra (local anesthetic) is used. If a tissue sample or stone is removed during the procedure, you may be given a medicine to make you sleep (general anesthetic). Your caregiver will gently insert the tip of the cystoscope into your urethra. The cystoscope will be slowly glided through your urethra and into your bladder. Sterile fluid will flow through the cystoscope and into your bladder. The fluid  will expand and stretch your bladder. This gives your caregiver a better view of your bladder walls. The procedure lasts about 15 20 minutes. AFTER THE PROCEDURE If a local anesthetic is used, you will be allowed to go home as soon as you are ready. If a general anesthetic is used, you will be taken to a recovery area until you are stable. You may have temporary bleeding and burning on urination. Document Released: 04/29/2000  Document Revised: 01/25/2012 Document Reviewed: 10/24/2011 [facility] Patient Information 2014 [place].    PATIENT INSTRUCTIONS POST-ANESTHESIA  IMMEDIATELY FOLLOWING SURGERY:  Do not drive or operate machinery for the first twenty four hours after surgery.  Do not make any important decisions for twenty four hours after surgery or while taking narcotic pain medications or sedatives.  If you develop intractable nausea and vomiting or a severe headache please notify your doctor immediately.  FOLLOW-UP:  Please make an appointment with your surgeon as instructed. You do not need to follow up with anesthesia unless specifically instructed to do so.  WOUND CARE INSTRUCTIONS (if applicable):  Keep a dry clean dressing on the anesthesia/puncture wound site if there is drainage.  Once the wound has quit draining you may leave it open to air.  Generally you should leave the bandage intact for twenty four hours unless there is drainage.  If the epidural site drains for more than 36-48 hours please call the anesthesia department.  QUESTIONS?:  Please feel free to call your physician or the hospital operator if you have any questions, and they will be happy to assist you.

## 2013-08-13 ENCOUNTER — Encounter (HOSPITAL_COMMUNITY)
Admission: RE | Disposition: A | Discharge status: Home / Self Care | Payer: Self-pay | Source: Ambulatory Visit | Attending: Urology

## 2013-08-13 ENCOUNTER — Ambulatory Visit (HOSPITAL_COMMUNITY): Payer: Medicare Other | Admitting: Anesthesiology

## 2013-08-13 ENCOUNTER — Ambulatory Visit (HOSPITAL_COMMUNITY)
Admission: RE | Admit: 2013-08-13 | Discharge: 2013-08-13 | Disposition: A | Discharge status: Home / Self Care | Payer: Medicare Other | Source: Ambulatory Visit | Attending: Urology | Admitting: Urology

## 2013-08-13 ENCOUNTER — Encounter (HOSPITAL_COMMUNITY): Payer: Self-pay | Admitting: *Deleted

## 2013-08-13 ENCOUNTER — Encounter (HOSPITAL_COMMUNITY): Payer: Medicare Other | Admitting: Anesthesiology

## 2013-08-13 DIAGNOSIS — I10 Essential (primary) hypertension: Secondary | ICD-10-CM | POA: Insufficient documentation

## 2013-08-13 DIAGNOSIS — N3020 Other chronic cystitis without hematuria: Secondary | ICD-10-CM | POA: Insufficient documentation

## 2013-08-13 DIAGNOSIS — Z7982 Long term (current) use of aspirin: Secondary | ICD-10-CM | POA: Insufficient documentation

## 2013-08-13 DIAGNOSIS — N323 Diverticulum of bladder: Secondary | ICD-10-CM | POA: Insufficient documentation

## 2013-08-13 DIAGNOSIS — Z79899 Other long term (current) drug therapy: Secondary | ICD-10-CM | POA: Insufficient documentation

## 2013-08-13 DIAGNOSIS — R319 Hematuria, unspecified: Secondary | ICD-10-CM | POA: Insufficient documentation

## 2013-08-13 HISTORY — PX: CYSTOSCOPY WITH BIOPSY: SHX5122

## 2013-08-13 LAB — GLUCOSE, CAPILLARY: Glucose-Capillary: 100 mg/dL — ABNORMAL HIGH (ref 70–99)

## 2013-08-13 SURGERY — CYSTOSCOPY, WITH BIOPSY
Anesthesia: General | Site: Urethra

## 2013-08-13 MED ORDER — LACTATED RINGERS IV SOLN
INTRAVENOUS | Status: DC
  Administered 2013-08-13: 12:00:00 via INTRAVENOUS

## 2013-08-13 MED ORDER — FENTANYL CITRATE 0.05 MG/ML IJ SOLN: 25.0000 ug | INTRAMUSCULAR | Status: DC | PRN

## 2013-08-13 MED ORDER — FENTANYL CITRATE 0.05 MG/ML IJ SOLN
INTRAMUSCULAR | Status: AC
  Filled 2013-08-13: qty 2

## 2013-08-13 MED ORDER — EPHEDRINE SULFATE 50 MG/ML IJ SOLN
INTRAMUSCULAR | Status: AC
  Filled 2013-08-13: qty 1

## 2013-08-13 MED ORDER — GLYCOPYRROLATE 0.2 MG/ML IJ SOLN
0.2000 mg | Freq: Once | INTRAMUSCULAR | Status: AC
  Administered 2013-08-13: 0.2 mg via INTRAVENOUS

## 2013-08-13 MED ORDER — ONDANSETRON HCL 4 MG/2ML IJ SOLN
INTRAMUSCULAR | Status: AC
  Filled 2013-08-13: qty 2

## 2013-08-13 MED ORDER — SODIUM CHLORIDE BACTERIOSTATIC 0.9 % IJ SOLN
INTRAMUSCULAR | Status: AC
  Filled 2013-08-13: qty 10

## 2013-08-13 MED ORDER — SUCCINYLCHOLINE CHLORIDE 20 MG/ML IJ SOLN
INTRAMUSCULAR | Status: AC
  Filled 2013-08-13: qty 1

## 2013-08-13 MED ORDER — ONDANSETRON HCL 4 MG/2ML IJ SOLN: 4.0000 mg | Freq: Once | INTRAMUSCULAR | Status: DC | PRN

## 2013-08-13 MED ORDER — FENTANYL CITRATE 0.05 MG/ML IJ SOLN
25.0000 ug | INTRAMUSCULAR | Status: AC
Start: 2013-08-13 — End: 2013-08-13
  Administered 2013-08-13 (×2): 25 ug via INTRAVENOUS

## 2013-08-13 MED ORDER — PROPOFOL 10 MG/ML IV BOLUS
INTRAVENOUS | Status: DC | PRN
  Administered 2013-08-13: 50 mg via INTRAVENOUS
  Administered 2013-08-13: 150 mg via INTRAVENOUS

## 2013-08-13 MED ORDER — EPHEDRINE SULFATE 50 MG/ML IJ SOLN
INTRAMUSCULAR | Status: DC | PRN
  Administered 2013-08-13 (×3): 10 mg via INTRAVENOUS

## 2013-08-13 MED ORDER — HYDROCODONE-ACETAMINOPHEN 5-325 MG PO TABS: 1.0000 | ORAL_TABLET | ORAL | Status: DC | PRN

## 2013-08-13 MED ORDER — CEPHALEXIN 250 MG PO CAPS: 250.0000 mg | ORAL_CAPSULE | Freq: Two times a day (BID) | ORAL | Status: DC

## 2013-08-13 MED ORDER — LIDOCAINE HCL (PF) 1 % IJ SOLN
INTRAMUSCULAR | Status: AC
  Filled 2013-08-13: qty 5

## 2013-08-13 MED ORDER — MIDAZOLAM HCL 2 MG/2ML IJ SOLN
1.0000 mg | INTRAMUSCULAR | Status: DC | PRN
  Administered 2013-08-13: 2 mg via INTRAVENOUS

## 2013-08-13 MED ORDER — ONDANSETRON HCL 4 MG/2ML IJ SOLN
4.0000 mg | Freq: Once | INTRAMUSCULAR | Status: AC
  Administered 2013-08-13: 4 mg via INTRAVENOUS

## 2013-08-13 MED ORDER — STERILE WATER FOR IRRIGATION IR SOLN
Status: DC | PRN
  Administered 2013-08-13: 3000 mL

## 2013-08-13 MED ORDER — CEFAZOLIN SODIUM 1-5 GM-% IV SOLN
INTRAVENOUS | Status: AC
  Filled 2013-08-13: qty 50

## 2013-08-13 MED ORDER — PROPOFOL 10 MG/ML IV EMUL
INTRAVENOUS | Status: AC
  Filled 2013-08-13: qty 20

## 2013-08-13 MED ORDER — GLYCOPYRROLATE 0.2 MG/ML IJ SOLN
INTRAMUSCULAR | Status: AC
  Filled 2013-08-13: qty 1

## 2013-08-13 MED ORDER — MIDAZOLAM HCL 2 MG/2ML IJ SOLN
INTRAMUSCULAR | Status: AC
  Filled 2013-08-13: qty 2

## 2013-08-13 MED ORDER — CEFAZOLIN SODIUM 1-5 GM-% IV SOLN
1.0000 g | INTRAVENOUS | Status: AC
  Administered 2013-08-13 (×2): 1 g via INTRAVENOUS
  Filled 2013-08-13: qty 50

## 2013-08-13 SURGICAL SUPPLY — 20 items
BAG DRAIN CYSTO-URO STER (DRAIN) ×3 IMPLANT
BAG URINE LEG 500ML (DRAIN) ×2 IMPLANT
CATH FOLEY 2WAY SLVR  5CC 20FR (CATHETERS) ×2
CATH FOLEY 2WAY SLVR 5CC 20FR (CATHETERS) IMPLANT
CLOTH BEACON ORANGE TIMEOUT ST (SAFETY) ×3 IMPLANT
ELECT REM PT RETURN 9FT ADLT (ELECTROSURGICAL) ×3
ELECTRODE REM PT RTRN 9FT ADLT (ELECTROSURGICAL) ×1 IMPLANT
FORMALIN 10 PREFILL 60ML (MISCELLANEOUS) ×5 IMPLANT
GLOVE BIO SURGEON STRL SZ8 (GLOVE) ×3 IMPLANT
GOWN STRL REIN XL XLG (GOWN DISPOSABLE) ×6 IMPLANT
KIT ROOM TURNOVER AP CYSTO (KITS) ×3 IMPLANT
MANIFOLD NEPTUNE II (INSTRUMENTS) ×3 IMPLANT
NDL SAFETY ECLIPSE 18X1.5 (NEEDLE) IMPLANT
NEEDLE HYPO 18GX1.5 SHARP (NEEDLE)
NEEDLE HYPO 22GX1.5 SAFETY (NEEDLE) IMPLANT
NS IRRIG 500ML POUR BTL (IV SOLUTION) ×2 IMPLANT
PACK CYSTO (CUSTOM PROCEDURE TRAY) ×3 IMPLANT
PAD TELFA 3X4 1S STER (GAUZE/BANDAGES/DRESSINGS) ×3 IMPLANT
SYR 20CC LL (SYRINGE) IMPLANT
WATER STERILE IRR 3000ML UROMA (IV SOLUTION) ×3 IMPLANT

## 2013-08-13 NOTE — Anesthesia Postprocedure Evaluation (Signed)
  Anesthesia Post-op Note  Patient: Cody Palmer  Procedure(s) Performed: Procedure(s): CYSTOSCOPY WITH BLADDER BIOPSY (N/A)  Patient Location: PACU  Anesthesia Type:General  Level of Consciousness: awake, alert  and oriented  Airway and Oxygen Therapy: Patient Spontanous Breathing  Post-op Pain: none  Post-op Assessment: Post-op Vital signs reviewed, Patient's Cardiovascular Status Stable, Respiratory Function Stable, Patent Airway and No signs of Nausea or vomiting  Post-op Vital Signs: Reviewed and stable  Complications: No apparent anesthesia complications

## 2013-08-13 NOTE — H&P (Signed)
Urology History and Physical Exam  CC: bladder abnormality  HPI: 80 year old male presents at this time for cystoscopy and bladder biopsy.  The patienthas a history of hematuria, both microscopic and gross.  CT scan reveals 2 bladder diverticula, one of these cystoscopically had urothelial abnormalities.  He is here today for biopsy under anesthesia.  PMH: Past Medical History  Diagnosis Date  . HTN (hypertension)   . Hypercholesteremia   . Arthritis     PSH: Past Surgical History  Procedure Laterality Date  . Eye surgery  1970 's    removal of eye  . 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 amLODipine (NORVASC) 10 MG tablet Take 10 mg by mouth daily.      Marland Kitchen aspirin EC 81 MG tablet Take 81 mg by mouth daily.      Marland Kitchen labetalol (NORMODYNE) 200 MG tablet Take 200 mg by mouth 2 (two) times daily.      Marland Kitchen losartan-hydrochlorothiazide (HYZAAR) 100-25 MG per tablet Take 1 tablet by mouth daily.      . Melatonin 3 MG TABS Take 6 mg by mouth at bedtime as needed.      . naproxen (NAPROSYN) 500 MG tablet Take 500 mg by mouth daily.      . rosuvastatin (CRESTOR) 10 MG tablet Take 10 mg by mouth daily.      . vitamin C (ASCORBIC ACID) 500 MG tablet Take 500 mg by mouth daily.         Social History: History   Social History  . Marital Status: Widowed    Spouse Name: N/A    Number of Children: N/A  . Years of Education: N/A   Occupational History  . Not on file.   Social History Main Topics  . Smoking status: Former Smoker -- 1.00 packs/day for 8 years    Quit date: 01/07/1965  . Smokeless tobacco: Not on file  . Alcohol Use: No  . Drug Use: No  . Sexual Activity: Yes    Birth Control/ Protection: None   Other Topics  Concern  . Not on file   Social History Narrative  . No narrative on file    Family History: Family History  Problem Relation Age of Onset  . Arthritis    . Cancer    . Diabetes      Review of Systems: Genitourinary, constitutional, skin, eye, otolaryngeal, hematologic/lymphatic, cardiovascular, pulmonary, endocrine, musculoskeletal, gastrointestinal, neurological and psychiatric system(s) were reviewed and pertinent findings if present are noted.  Genitourinary: nocturia and hematuria.  ENT: sinus problems.  Musculoskeletal: joint pain.   Physical Exam: Constitutional: Well nourished and well developed . No acute distress.  ENT:. The ears and nose are normal in appearance.  Neck: The appearance of the neck is normal and no neck mass is present.  Pulmonary: No respiratory distress and normal respiratory rhythm and effort.  Cardiovascular: Heart rate and rhythm are normal . No peripheral edema.  Abdomen: The abdomen is mildly obese. The abdomen is soft and nontender. No masses are palpated. No CVA tenderness.  A right inguinal hernia is present, which is reducible. No hepatosplenomegaly noted.  Rectal: Rectal exam demonstrates normal sphincter tone, the anus is normal on inspection., no tenderness and no masses.  Estimated prostate size is 1+. Normal rectal tone, no rectal masses, prostate is smooth, symmetric and non-tender. The prostate has no nodularity and is not tender. The left seminal vesicle is nonpalpable. The right seminal vesicle is nonpalpable. The perineum is normal on inspection.  Genitourinary: Examination of the penis demonstrates no discharge, no masses, no lesions and a normal meatus. The penis is uncircumcised. The scrotum is normal in appearance and without lesions. The right epididymis is palpably normal and non-tender. The left epididymis is palpably normal and non-tender. The right testis is palpably normal, non-tender and without masses. The left testis is normal,  non-tender and without masses.  Lymphatics: The femoral and inguinal nodes are not enlarged or tender.  Skin: Normal skin turgor, no visible rash and no visible skin lesions.  Neuro/Psych:. Mood and affect are appropriate.      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

## 2013-08-13 NOTE — Discharge Instructions (Signed)
1. You may see some blood in the urine and may have some burning with urination for 48-72 hours. You also may notice that you have to urinate more frequently or urgently after your procedure which is normal.  2. You should call should you develop an inability urinate, fever > 101, persistent nausea and vomiting that prevents you from eating or drinking to stay hydrated.  3. If you have a stent, you will likely urinate more frequently and urgently until the stent is removed and you may experience some discomfort/pain in the lower abdomen and flank especially when urinating. You may take pain medication prescribed to you if needed for pain. You may also intermittently have blood in the urine until the stent is removed. 4. If you have a catheter, you will be taught how to take care of the catheter by the nursing staff prior to discharge from the hospital.  You may periodically feel a strong urge to void with the catheter in place.  This is a bladder spasm and most often can occur when having a bowel movement or moving around. It is typically self-limited and usually will stop after a few minutes.  You may use some Vaseline or Neosporin around the tip of the catheter to reduce friction at the tip of the penis. You may also see some blood in the urine.  A very small amount of blood can make the urine look quite red.  As long as the catheter is draining well, there usually is not a problem.  However, if the catheter is not draining well and is bloody, you should call the office [phone]) to notify us. Remove catheter as instructed on Thursday morning   PATIENT INSTRUCTIONS POST-ANESTHESIA  IMMEDIATELY FOLLOWING SURGERY:  Do not drive or operate machinery for the first twenty four hours after surgery.  Do not make any important decisions for twenty four hours after surgery or while taking narcotic pain medications or sedatives.  If you develop intractable nausea and vomiting or a severe headache please notify  your doctor immediately.  FOLLOW-UP:  Please make an appointment with your surgeon as instructed. You do not need to follow up with anesthesia unless specifically instructed to do so.  WOUND CARE INSTRUCTIONS (if applicable):  Keep a dry clean dressing on the anesthesia/puncture wound site if there is drainage.  Once the wound has quit draining you may leave it open to air.  Generally you should leave the bandage intact for twenty four hours unless there is drainage.  If the epidural site drains for more than 36-48 hours please call the anesthesia department.  QUESTIONS?:  Please feel free to call your physician or the hospital operator if you have any questions, and they will be happy to assist you.

## 2013-08-13 NOTE — Op Note (Addendum)
PATIENT:  Cody Palmer  PRE-OPERATIVE DIAGNOSIS:  Hematuria, with abnormal urothelium and bladder diverticulum  POST-OPERATIVE DIAGNOSIS: Same  PROCEDURE: Cystoscopy, bladder biopsy  SURGEON:  Lillette Boxer. Sentoria Brent, M.D.  ANESTHESIA:  General  EBL:  Minimal  DRAINS: None  LOCAL MEDICATIONS USED:  None  SPECIMEN:  1. Biopsies from superior bladder diverticulum  2. Biopsies from inferior bladder diverticulum  INDICATION: Cody Palmer is a 80 year old male who presented with gross hematuria. CT urogram revealed no abnormalities except for 2 bladder diverticula. Cystoscopy revealed abnormal urothelium within one of these. He presents at this time for cystoscopy and bladder biopsy.  Description of procedure: The patient was properly identified and marked (if applicable) in the holding area. They were then  taken to the operating room and placed on the table in a supine position. General anesthesia was then administered. Once fully anesthetized the patient was moved to the dorsolithotomy position and the genitalia and perineum were sterilely prepped and draped in standard fashion. An official timeout was then performed.  A 22 French panendoscope was advanced under direct vision through his urethra. There were no urethral lesions. Prostate was nonobstructive. The bladder was entered and inspected circumferentially. There were 1+ trabeculations. Ureteral orifices were normal in configuration and location. No significant urothelial lesions were seen in the entire bladder, inspected with both the 12 and 70 lenses. There were 2 narrow mouth diverticula he, both in the midline just superior to the trigone. I passed the 12 lens into each of these. The inferior diverticulum that raised urothelium in a polyoid fashion. There was no neovascularity. No other lesions were seen. The superior diverticulum had a patch of erythematous urothelium which was not raised. Cold cup biopsies were used to biopsy  both the inferior and the superior diverticulae. Biopsies were sent as superior and anterior bladder biopsies, respectively. Following complete removal of the abnormal appearing urothelium in his diverticulum, they were both cauterized with electrocautery. No bleeding was seen.  Each biopsy was approximately 4 mm in size.  At this point the scope was removed. A 20 French Foley catheter was placed, the balloon filled with 10 cc, and hooked to dependent drainage. Patient tolerated procedure well. He was taken to PACU in stable condition.    PLAN OF CARE: Discharge to home after PACU  PATIENT DISPOSITION:  PACU - hemodynamically stable.

## 2013-08-13 NOTE — Anesthesia Procedure Notes (Signed)
Procedure Name: LMA Insertion Date/Time: 08/13/2013 12:57 PM Performed by: Tressie Stalker E Pre-anesthesia Checklist: Patient identified, Patient being monitored, Emergency Drugs available, Timeout performed and Suction available Patient Re-evaluated:Patient Re-evaluated prior to inductionOxygen Delivery Method: Circle System Utilized Preoxygenation: Pre-oxygenation with 100% oxygen Intubation Type: IV induction Ventilation: Mask ventilation without difficulty LMA: LMA inserted LMA Size: 5.0 Number of attempts: 1 Placement Confirmation: positive ETCO2 and breath sounds checked- equal and bilateral

## 2013-08-13 NOTE — Transfer of Care (Signed)
Immediate Anesthesia Transfer of Care Note  Patient: Cody Palmer  Procedure(s) Performed: Procedure(s): CYSTOSCOPY WITH BLADDER BIOPSY (N/A)  Patient Location: PACU  Anesthesia Type:General  Level of Consciousness: awake and alert   Airway & Oxygen Therapy: Patient Spontanous Breathing and Patient connected to face mask oxygen  Post-op Assessment: Report given to PACU RN  Post vital signs: Reviewed and stable  Complications: No apparent anesthesia complications

## 2013-08-13 NOTE — Anesthesia Preprocedure Evaluation (Signed)
Anesthesia Evaluation  Patient identified by MRN, date of birth, ID band Patient awake    Reviewed: Allergy & Precautions, H&P , NPO status , Patient's Chart, lab work & pertinent test results, reviewed documented beta blocker date and time   Airway Mallampati: II TM Distance: >3 FB Neck ROM: Full    Dental  (+) Partial Upper, Teeth Intact   Pulmonary neg pulmonary ROS, former smoker,  breath sounds clear to auscultation        Cardiovascular hypertension, Pt. on medications and Pt. on home beta blockers Rhythm:Regular Rate:Normal     Neuro/Psych    GI/Hepatic negative GI ROS,   Endo/Other    Renal/GU      Musculoskeletal   Abdominal   Peds  Hematology   Anesthesia Other Findings   Reproductive/Obstetrics                           Anesthesia Physical Anesthesia Plan  ASA: II  Anesthesia Plan: General   Post-op Pain Management:    Induction: Intravenous  Airway Management Planned: LMA  Additional Equipment:   Intra-op Plan:   Post-operative Plan: Extubation in OR  Informed Consent: I have reviewed the patients History and Physical, chart, labs and discussed the procedure including the risks, benefits and alternatives for the proposed anesthesia with the patient or authorized representative who has indicated his/her understanding and acceptance.     Plan Discussed with:   Anesthesia Plan Comments:         Anesthesia Quick Evaluation

## 2013-08-13 NOTE — Interval H&P Note (Signed)
History and Physical Interval Note:  08/13/2013 12:46 PM  [name]  has presented today for surgery, with the diagnosis of Bladder Diverticulum, Hematuria  The various methods of treatment have been discussed with the patient and family. After consideration of risks, benefits and other options for treatment, the patient has consented to  Procedure(s) with comments: CYSTOSCOPY WITH BIOPSY (N/A) - 30 mins requested for this case  BLADDER BIOPSY  [phone] HOME #  [employer] GRP # [id number] ID # [id number] as a surgical intervention .  The patient's history has been reviewed, patient examined, no change in status, stable for surgery.  I have reviewed the patient's chart and labs.  Questions were answered to the patient's satisfaction.     Cody Palmer

## 2013-08-14 ENCOUNTER — Encounter (HOSPITAL_COMMUNITY): Payer: Self-pay | Admitting: Urology

## 2013-09-17 ENCOUNTER — Ambulatory Visit (INDEPENDENT_AMBULATORY_CARE_PROVIDER_SITE_OTHER): Payer: Medicare Other | Admitting: Urology

## 2013-09-17 DIAGNOSIS — N323 Diverticulum of bladder: Secondary | ICD-10-CM

## 2013-12-31 ENCOUNTER — Encounter: Payer: Self-pay | Admitting: Orthopedic Surgery

## 2013-12-31 ENCOUNTER — Ambulatory Visit (INDEPENDENT_AMBULATORY_CARE_PROVIDER_SITE_OTHER): Payer: Medicare Other

## 2013-12-31 ENCOUNTER — Ambulatory Visit (INDEPENDENT_AMBULATORY_CARE_PROVIDER_SITE_OTHER): Payer: Medicare Other | Admitting: Orthopedic Surgery

## 2013-12-31 VITALS — BP 138/71 | Ht 70.0 in | Wt 220.0 lb

## 2013-12-31 DIAGNOSIS — M1710 Unilateral primary osteoarthritis, unspecified knee: Secondary | ICD-10-CM

## 2013-12-31 DIAGNOSIS — IMO0002 Reserved for concepts with insufficient information to code with codable children: Secondary | ICD-10-CM

## 2013-12-31 NOTE — Progress Notes (Signed)
Chief complaint total knee follow-up.  History this is a follow-up visit. Status post right  total knee replacement.  DOS: 2014  Implant DePuy yes  Review of systems patient has no complaints.  Exam Physical Exam(6) GENERAL: normal development   CDV: pulses are normal   Skin: normal  Psychiatric: awake, alert and oriented  Neuro: normal sensation  1 ambulation normal no assistive devices  2 ROM = 115  3 Motor normal  4 Stability normal   Separate x-ray report.  Reason for x-ray, and we'll x-ray follow-up knee replacement.  3 views right  knee.  The implant is aligned normally. There is no loosening.  Impression normal appearing knee replacement.    Assessment: Knee replacement functioning well    Plan: One year follow

## 2014-03-19 IMAGING — CR DG KNEE 1-2V*R*
2 series · 2 of 2 positions shown · non-contrast
Comparison: 12/13/2012

CLINICAL DATA: Status post right knee replacement

RIGHT KNEE - 1-2 VIEW

[ap portable (1 of 2)]
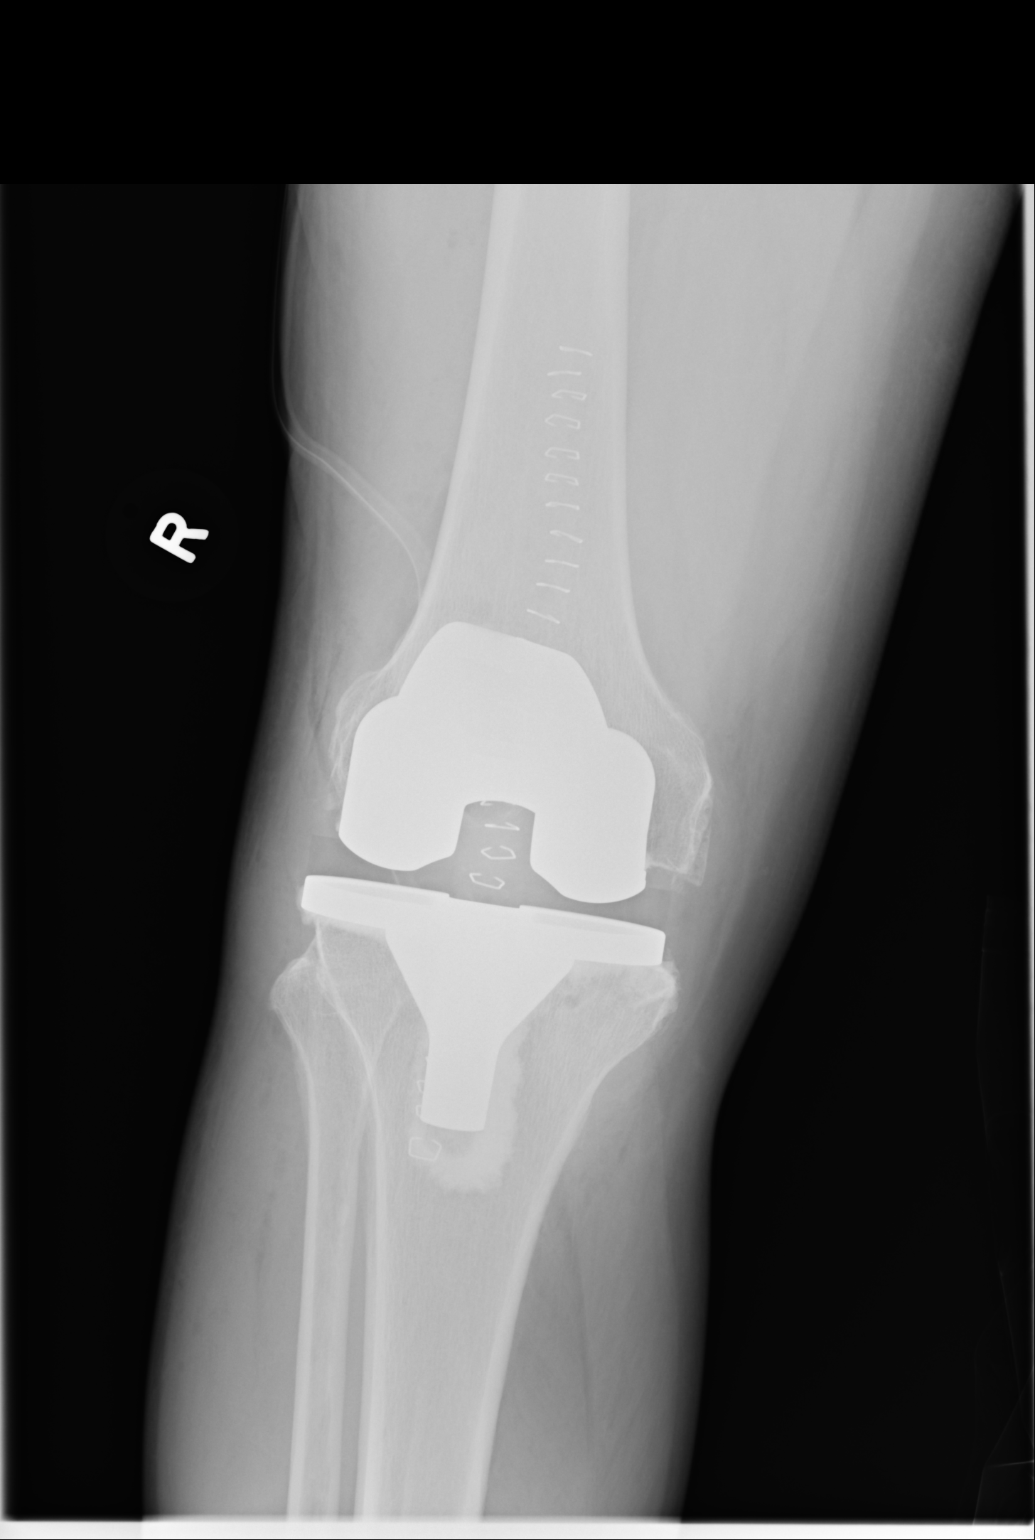

[ap portable (2 of 2)]
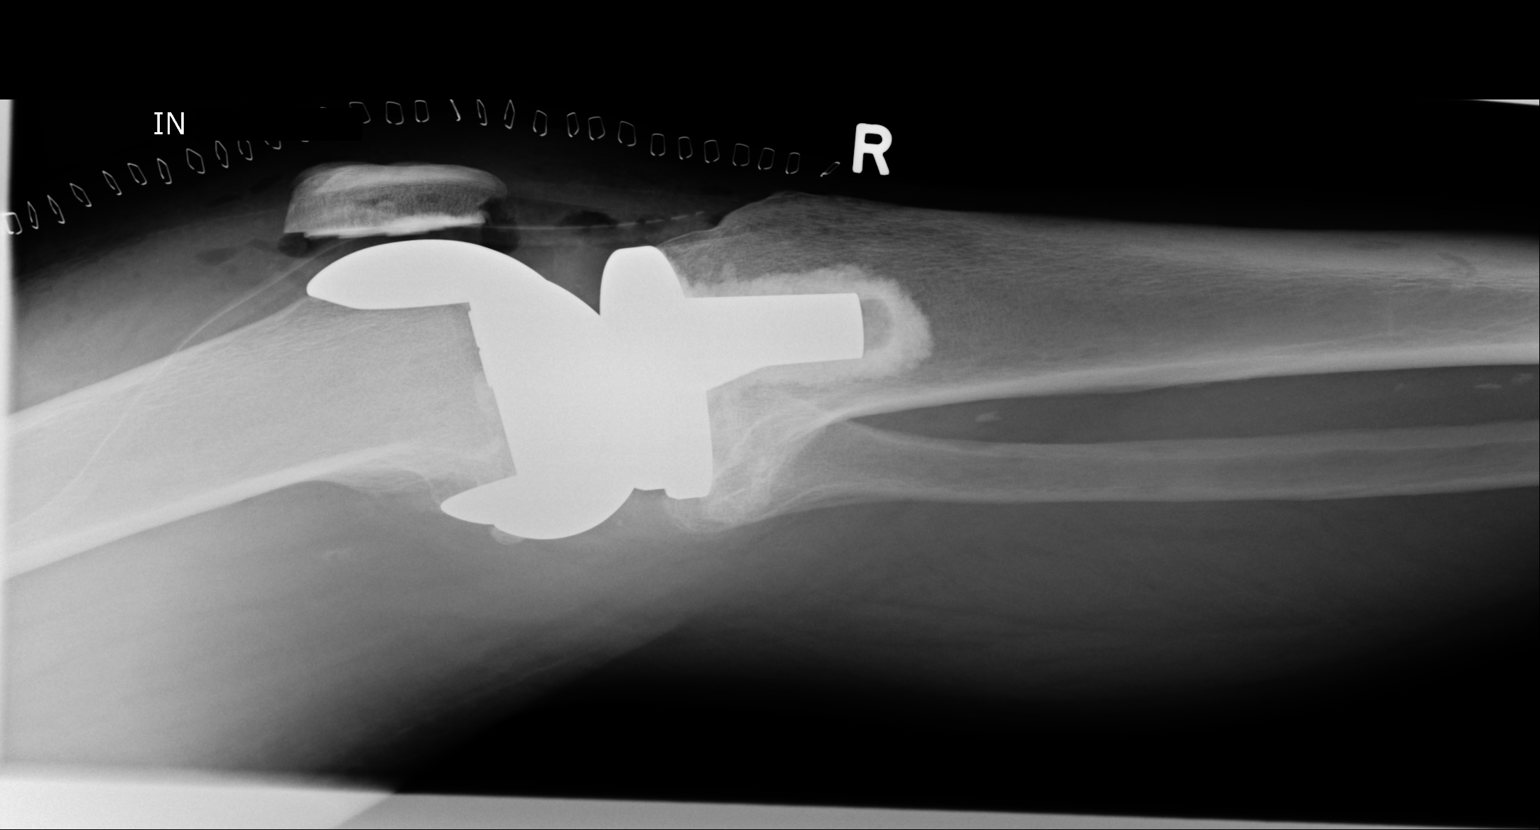

[2 of 2 positions shown; findings below may reference images not displayed]

FINDINGS: A total right knee replacement is now seen.  A surgical
drain is noted in place.  No acute abnormality is noted.

## 2014-07-15 ENCOUNTER — Ambulatory Visit (INDEPENDENT_AMBULATORY_CARE_PROVIDER_SITE_OTHER): Payer: Medicare Other | Admitting: Urology

## 2014-07-15 DIAGNOSIS — R310 Gross hematuria: Secondary | ICD-10-CM

## 2014-07-15 DIAGNOSIS — N390 Urinary tract infection, site not specified: Secondary | ICD-10-CM | POA: Diagnosis not present

## 2014-07-15 DIAGNOSIS — N323 Diverticulum of bladder: Secondary | ICD-10-CM | POA: Diagnosis not present

## 2015-01-01 ENCOUNTER — Ambulatory Visit (INDEPENDENT_AMBULATORY_CARE_PROVIDER_SITE_OTHER): Payer: Medicare Other | Admitting: Orthopedic Surgery

## 2015-01-01 ENCOUNTER — Ambulatory Visit (HOSPITAL_COMMUNITY)
Admission: RE | Admit: 2015-01-01 | Discharge: 2015-01-01 | Disposition: A | Discharge status: Home / Self Care | Payer: Medicare Other | Source: Ambulatory Visit | Attending: Orthopedic Surgery | Admitting: Orthopedic Surgery

## 2015-01-01 ENCOUNTER — Other Ambulatory Visit: Payer: Self-pay | Admitting: Orthopedic Surgery

## 2015-01-01 VITALS — BP 120/60 | Ht 70.0 in | Wt 220.0 lb

## 2015-01-01 DIAGNOSIS — M1711 Unilateral primary osteoarthritis, right knee: Secondary | ICD-10-CM

## 2015-01-01 DIAGNOSIS — Z96651 Presence of right artificial knee joint: Secondary | ICD-10-CM

## 2015-01-01 DIAGNOSIS — M179 Osteoarthritis of knee, unspecified: Secondary | ICD-10-CM | POA: Diagnosis not present

## 2015-01-01 NOTE — Progress Notes (Signed)
Progress note.  The patient is status post right total knee Chief Complaint  Patient presents with  . Follow-up    yearly follow up + xray Right TKA    This is a two-year postop visit for him. His surgery was 01/11/2013 he had an x-ray today at the [facility] diagnostic center and it shows his prosthesis is aligned well and there are no signs of loosening  He has no complaints  His review of systems is benign related to his knee  His incision looks good he has full extension 120 of flexion and his knee is stable in anterior posterior medial lateral planes. His extension manual muscle test is grade 5. Neurovascular exam is intact his vital signs were BP 120/60 mmHg  Ht 5\' 10"  (1.778 m)  Wt 220 lb (99.791 kg)  BMI 31.57 kg/m2  Return in a year  I reviewed today's film and I independently interpreted as a stable prosthesis with normal alignment  Repeat x-ray one year

## 2015-01-20 ENCOUNTER — Ambulatory Visit (INDEPENDENT_AMBULATORY_CARE_PROVIDER_SITE_OTHER): Payer: Medicare Other | Admitting: Urology

## 2015-01-20 DIAGNOSIS — R310 Gross hematuria: Secondary | ICD-10-CM

## 2015-01-20 DIAGNOSIS — N323 Diverticulum of bladder: Secondary | ICD-10-CM

## 2016-01-04 ENCOUNTER — Ambulatory Visit: Payer: Medicare Other | Admitting: Orthopedic Surgery

## 2016-01-19 ENCOUNTER — Ambulatory Visit (INDEPENDENT_AMBULATORY_CARE_PROVIDER_SITE_OTHER): Payer: Medicare Other

## 2016-01-19 ENCOUNTER — Encounter: Payer: Self-pay | Admitting: Orthopedic Surgery

## 2016-01-19 ENCOUNTER — Ambulatory Visit (INDEPENDENT_AMBULATORY_CARE_PROVIDER_SITE_OTHER): Payer: Medicare Other | Admitting: Orthopedic Surgery

## 2016-01-19 VITALS — BP 146/70 | HR 85 | Ht 70.0 in | Wt 210.0 lb

## 2016-01-19 DIAGNOSIS — M129 Arthropathy, unspecified: Secondary | ICD-10-CM | POA: Diagnosis not present

## 2016-01-19 DIAGNOSIS — M1710 Unilateral primary osteoarthritis, unspecified knee: Secondary | ICD-10-CM

## 2016-01-19 DIAGNOSIS — Z96651 Presence of right artificial knee joint: Secondary | ICD-10-CM | POA: Diagnosis not present

## 2016-01-19 NOTE — Progress Notes (Signed)
Patient ID: Cody Palmer, male   DOB: [birth year], 82 y.o.   MRN: [id number]  Post op annual TKA   Chief Complaint  Patient presents with  . Follow-up    Yearly recheck Right knee TKA DOS    HPI Cody Palmer is a 82 y.o. male.  Right knee Total knee follow-up 8.29.14, 3 year follow-up  The patient is able to golf and he can do all activities of daily living without any difficulty. No pain.   Past Medical History:  Diagnosis Date  . Arthritis   . HTN (hypertension)   . Hypercholesteremia     Past Surgical History:  Procedure Laterality Date  . CYSTOSCOPY WITH BIOPSY N/A 08/13/2013   Procedure: CYSTOSCOPY WITH BLADDER BIOPSY;  Surgeon: Franchot Gallo, MD;  Location: [facility];  Service: Urology;  Laterality: N/A;  . EYE SURGERY  1970 's   removal of eye  . TOTAL KNEE ARTHROPLASTY Right 01/11/2013   Procedure: RIGHT TOTAL KNEE ARTHROPLASTY;  Surgeon: Carole Civil, MD;  Location: [facility];  Service: Orthopedics;  Laterality: Right;     No Known Allergies  Current Outpatient Prescriptions  Medication Sig Dispense Refill  . metFORMIN (GLUCOPHAGE) 500 MG tablet Take by mouth daily.    Marland Kitchen amLODipine (NORVASC) 10 MG tablet Take 10 mg by mouth daily.    Marland Kitchen aspirin EC 81 MG tablet Take 81 mg by mouth daily.    Marland Kitchen labetalol (NORMODYNE) 200 MG tablet Take 200 mg by mouth 2 (two) times daily.    Marland Kitchen losartan-hydrochlorothiazide (HYZAAR) 100-25 MG per tablet Take 1 tablet by mouth daily.    . naproxen (NAPROSYN) 500 MG tablet Take 500 mg by mouth daily.    . rosuvastatin (CRESTOR) 10 MG tablet Take 10 mg by mouth daily.    . vitamin C (ASCORBIC ACID) 500 MG tablet Take 500 mg by mouth daily.     No current facility-administered medications for this visit.     Review of Systems Review of Systems  Constitutional: Negative for fever.     Physical Exam Blood pressure (!) 146/70, pulse 85, height 5\' 10"  (1.778 m), weight 210 lb (95.3 kg).   Gen. appearance is normal there are  no congenital abnormalities   The patient is oriented 3   Mood and affect are normal   Ambulation is without assistive device   Knee inspection reveals a well-healed incision with no swelling   Knee flexion 120  Stability in the anteroposterior plane is normal as well as in the medial lateral plane  Motor exam reveals full extension without extensor lag   Data Reviewed KNEE XRAYS : normal stable knee no loosening  Assessment S/P TKA right  Plan    One year follow-up x-ray       [name] 01/19/2016, 3:56 PM  Annual follow-up for right total knee  Chief complaint annual x-rays right total knee  Patient doing well no complaints  X-rays show stable prosthesis  Excellent flexion extension and the knee  Follow-up in one year

## 2016-02-02 ENCOUNTER — Ambulatory Visit (INDEPENDENT_AMBULATORY_CARE_PROVIDER_SITE_OTHER): Payer: Medicare Other | Admitting: Urology

## 2016-02-02 DIAGNOSIS — N323 Diverticulum of bladder: Secondary | ICD-10-CM

## 2016-03-08 IMAGING — CR DG KNEE AP/LAT W/ SUNRISE*R*
3 series · 3 of 3 positions shown · non-contrast
Comparison: 12/31/2013.

CLINICAL DATA: Knee replacement.

EXAM:
RIGHT KNEE 3 VIEWS

[view not recorded (1 of 3)]
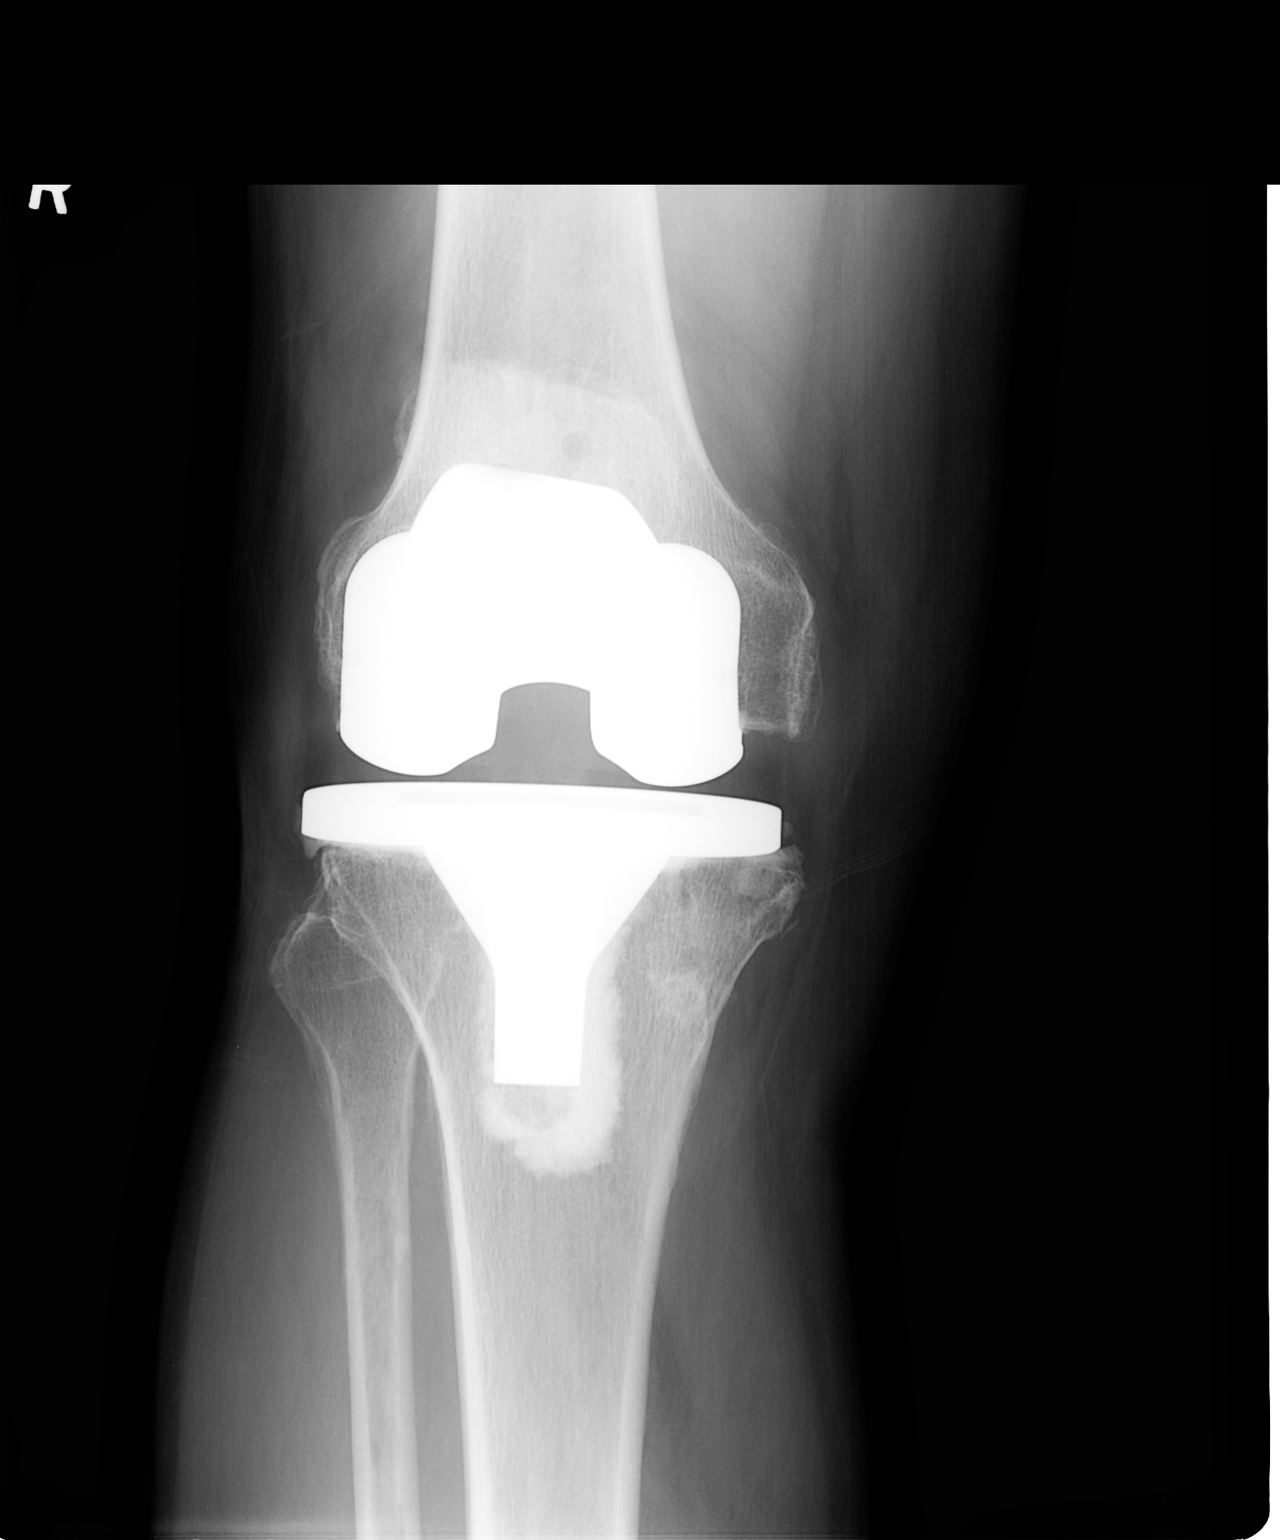

[view not recorded (2 of 3)]
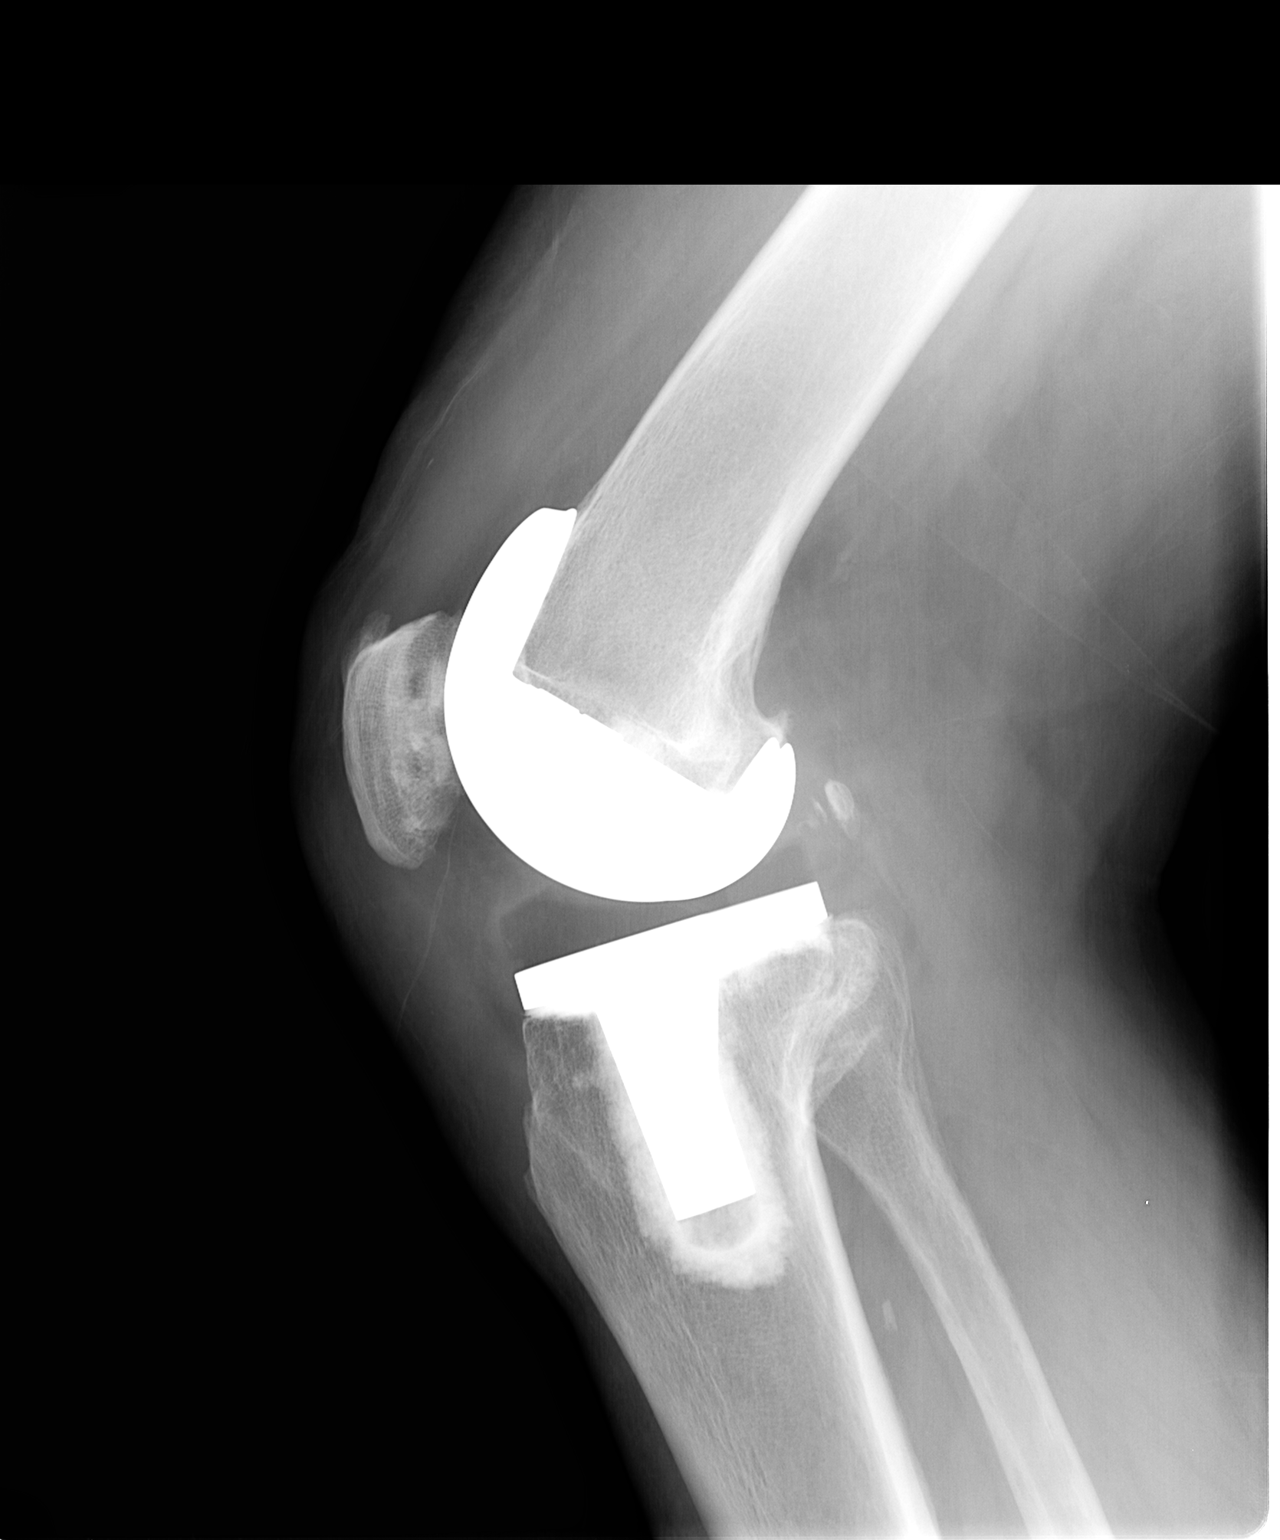

[view not recorded (3 of 3)]
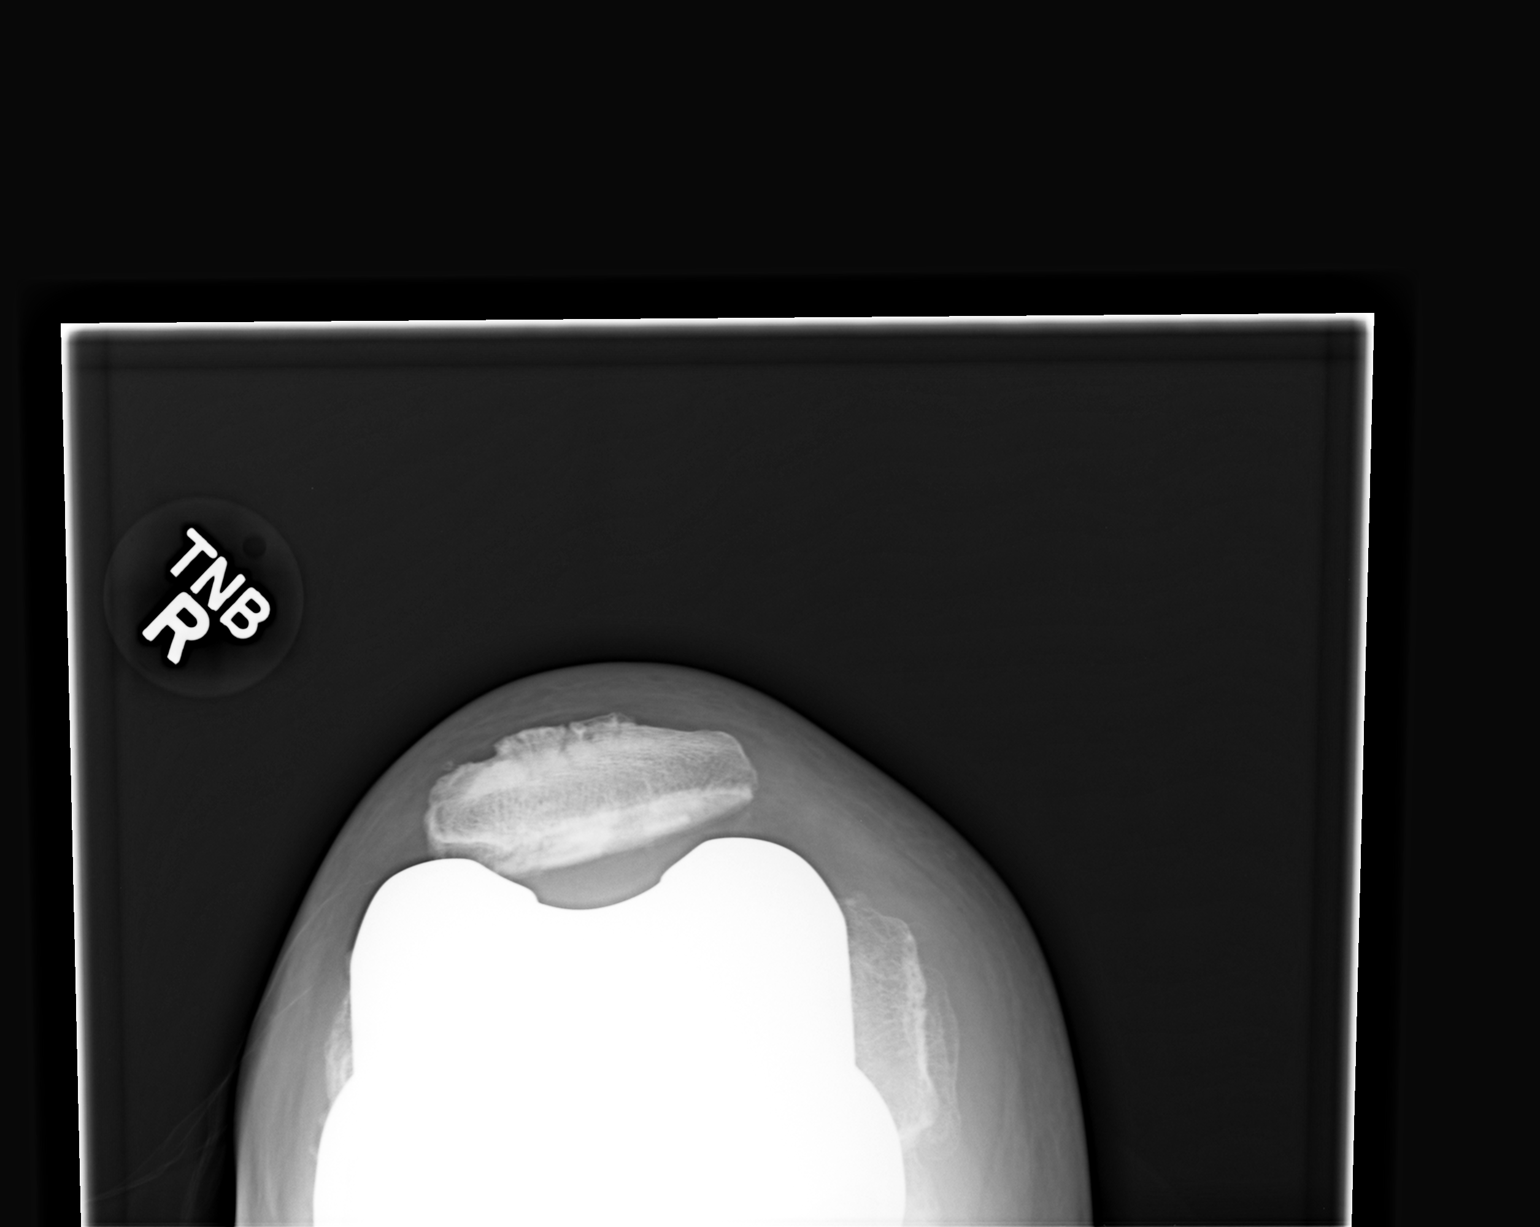

[3 of 3 positions shown; findings below may reference images not displayed]

FINDINGS: Total right knee replacement appears stable. Hardware intact. No
acute bony abnormality identified. Knee joint effusion cannot be
excluded.
IMPRESSION: 1. Total right knee replacement appears stable. Hardware intact. No
acute abnormality identified.
2. Knee joint effusion cannot be excluded.

## 2016-04-15 NOTE — Patient Instructions (Signed)
Your procedure is scheduled on:  04/25/2016  Report to [facility] at  95   AM.  Call this number if you have problems the morning of surgery: [phone]   Do not eat food or drink liquids :After Midnight.      Take these medicines the morning of surgery with A SIP OF WATER: norvasc, labetolol.   Do not wear jewelry, make-up or nail polish.  Do not wear lotions, powders, or perfumes. You may wear deodorant.  Do not shave 48 hours prior to surgery.  Do not bring valuables to the hospital.  Contacts, dentures or bridgework may not be worn into surgery.  Leave suitcase in the car. After surgery it may be brought to your room.  For patients admitted to the hospital, checkout time is 11:00 AM the day of discharge.   Patients discharged the day of surgery will not be allowed to drive home.  :     Please read over the following fact sheets that you were given: Coughing and Deep Breathing, Surgical Site Infection Prevention, Anesthesia Post-op Instructions and Care and Recovery After Surgery    Cataract A cataract is a clouding of the lens of the eye. When a lens becomes cloudy, vision is reduced based on the degree and nature of the clouding. Many cataracts reduce vision to some degree. Some cataracts make people more near-sighted as they develop. Other cataracts increase glare. Cataracts that are ignored and become worse can sometimes look white. The white color can be seen through the pupil. CAUSES   Aging. However, cataracts may occur at any age, even in newborns.   Certain drugs.   Trauma to the eye.   Certain diseases such as diabetes.   Specific eye diseases such as chronic inflammation inside the eye or a sudden attack of a rare form of glaucoma.   Inherited or acquired medical problems.  SYMPTOMS   Gradual, progressive drop in vision in the affected eye.   Severe, rapid visual loss. This most often happens when trauma is the cause.  DIAGNOSIS  To detect a cataract, an eye  doctor examines the lens. Cataracts are best diagnosed with an exam of the eyes with the pupils enlarged (dilated) by drops.  TREATMENT  For an early cataract, vision may improve by using different eyeglasses or stronger lighting. If that does not help your vision, surgery is the only effective treatment. A cataract needs to be surgically removed when vision loss interferes with your everyday activities, such as driving, reading, or watching TV. A cataract may also have to be removed if it prevents examination or treatment of another eye problem. Surgery removes the cloudy lens and usually replaces it with a substitute lens (intraocular lens, IOL).  At a time when both you and your doctor agree, the cataract will be surgically removed. If you have cataracts in both eyes, only one is usually removed at a time. This allows the operated eye to heal and be out of danger from any possible problems after surgery (such as infection or poor wound healing). In rare cases, a cataract may be doing damage to your eye. In these cases, your caregiver may advise surgical removal right away. The vast majority of people who have cataract surgery have better vision afterward. HOME CARE INSTRUCTIONS  If you are not planning surgery, you may be asked to do the following:  Use different eyeglasses.   Use stronger or brighter lighting.   Ask your eye doctor about reducing your  medicine dose or changing medicines if it is thought that a medicine caused your cataract. Changing medicines does not make the cataract go away on its own.   Become familiar with your surroundings. Poor vision can lead to injury. Avoid bumping into things on the affected side. You are at a higher risk for tripping or falling.   Exercise extreme care when driving or operating machinery.   Wear sunglasses if you are sensitive to bright light or experiencing problems with glare.  SEEK IMMEDIATE MEDICAL CARE IF:   You have a worsening or sudden  vision loss.   You notice redness, swelling, or increasing pain in the eye.   You have a fever.  Document Released: 05/02/2005 Document Revised: 04/21/2011 Document Reviewed: 12/24/2010 [facility] Patient Information 2012 [place].PATIENT INSTRUCTIONS POST-ANESTHESIA  IMMEDIATELY FOLLOWING SURGERY:  Do not drive or operate machinery for the first twenty four hours after surgery.  Do not make any important decisions for twenty four hours after surgery or while taking narcotic pain medications or sedatives.  If you develop intractable nausea and vomiting or a severe headache please notify your doctor immediately.  FOLLOW-UP:  Please make an appointment with your surgeon as instructed. You do not need to follow up with anesthesia unless specifically instructed to do so.  WOUND CARE INSTRUCTIONS (if applicable):  Keep a dry clean dressing on the anesthesia/puncture wound site if there is drainage.  Once the wound has quit draining you may leave it open to air.  Generally you should leave the bandage intact for twenty four hours unless there is drainage.  If the epidural site drains for more than 36-48 hours please call the anesthesia department.  QUESTIONS?:  Please feel free to call your physician or the hospital operator if you have any questions, and they will be happy to assist you.

## 2016-04-19 ENCOUNTER — Encounter (HOSPITAL_COMMUNITY): Payer: Self-pay

## 2016-04-19 ENCOUNTER — Encounter (HOSPITAL_COMMUNITY)
Admission: RE | Admit: 2016-04-19 | Discharge: 2016-04-19 | Disposition: A | Discharge status: Home / Self Care | Payer: Medicare Other | Source: Ambulatory Visit | Attending: Ophthalmology | Admitting: Ophthalmology

## 2016-04-19 DIAGNOSIS — Z01812 Encounter for preprocedural laboratory examination: Secondary | ICD-10-CM | POA: Diagnosis present

## 2016-04-19 DIAGNOSIS — Z01810 Encounter for preprocedural cardiovascular examination: Secondary | ICD-10-CM | POA: Diagnosis not present

## 2016-04-19 HISTORY — DX: Type 2 diabetes mellitus without complications: E11.9

## 2016-04-19 HISTORY — DX: Malignant (primary) neoplasm, unspecified: C80.1

## 2016-04-19 LAB — BASIC METABOLIC PANEL
ANION GAP: 8 (ref 5–15)
BUN: 24 mg/dL — ABNORMAL HIGH (ref 6–20)
CALCIUM: 9.1 mg/dL (ref 8.9–10.3)
CO2: 25 mmol/L (ref 22–32)
CREATININE: 0.9 mg/dL (ref 0.61–1.24)
Chloride: 100 mmol/L — ABNORMAL LOW (ref 101–111)
GLUCOSE: 126 mg/dL — AB (ref 65–99)
Potassium: 3.7 mmol/L (ref 3.5–5.1)
Sodium: 133 mmol/L — ABNORMAL LOW (ref 135–145)

## 2016-04-19 LAB — CBC WITH DIFFERENTIAL/PLATELET
BASOS ABS: 0.1 10*3/uL (ref 0.0–0.1)
BASOS PCT: 1 %
EOS PCT: 4 %
Eosinophils Absolute: 0.3 10*3/uL (ref 0.0–0.7)
HCT: 40.7 % (ref 39.0–52.0)
Hemoglobin: 13.8 g/dL (ref 13.0–17.0)
Lymphocytes Relative: 28 %
Lymphs Abs: 2.4 10*3/uL (ref 0.7–4.0)
MCH: 29 pg (ref 26.0–34.0)
MCHC: 33.9 g/dL (ref 30.0–36.0)
MCV: 85.5 fL (ref 78.0–100.0)
MONO ABS: 0.7 10*3/uL (ref 0.1–1.0)
Monocytes Relative: 8 %
Neutro Abs: 5.2 10*3/uL (ref 1.7–7.7)
Neutrophils Relative %: 59 %
PLATELETS: 223 10*3/uL (ref 150–400)
RBC: 4.76 MIL/uL (ref 4.22–5.81)
RDW: 13.7 % (ref 11.5–15.5)
WBC: 8.7 10*3/uL (ref 4.0–10.5)

## 2016-04-25 ENCOUNTER — Ambulatory Visit (HOSPITAL_COMMUNITY): Payer: Medicare Other | Admitting: Anesthesiology

## 2016-04-25 ENCOUNTER — Ambulatory Visit (HOSPITAL_COMMUNITY)
Admission: RE | Admit: 2016-04-25 | Discharge: 2016-04-25 | Disposition: A | Discharge status: Home / Self Care | Payer: Medicare Other | Source: Ambulatory Visit | Attending: Ophthalmology | Admitting: Ophthalmology

## 2016-04-25 ENCOUNTER — Encounter (HOSPITAL_COMMUNITY)
Admission: RE | Disposition: A | Discharge status: Home / Self Care | Payer: Self-pay | Source: Ambulatory Visit | Attending: Ophthalmology

## 2016-04-25 ENCOUNTER — Encounter (HOSPITAL_COMMUNITY): Payer: Self-pay

## 2016-04-25 DIAGNOSIS — E1136 Type 2 diabetes mellitus with diabetic cataract: Secondary | ICD-10-CM | POA: Insufficient documentation

## 2016-04-25 DIAGNOSIS — I10 Essential (primary) hypertension: Secondary | ICD-10-CM | POA: Diagnosis not present

## 2016-04-25 DIAGNOSIS — Z87891 Personal history of nicotine dependence: Secondary | ICD-10-CM | POA: Diagnosis not present

## 2016-04-25 DIAGNOSIS — Z79899 Other long term (current) drug therapy: Secondary | ICD-10-CM | POA: Diagnosis not present

## 2016-04-25 HISTORY — PX: CATARACT EXTRACTION W/PHACO: SHX586

## 2016-04-25 LAB — GLUCOSE, CAPILLARY: Glucose-Capillary: 121 mg/dL — ABNORMAL HIGH (ref 65–99)

## 2016-04-25 SURGERY — PHACOEMULSIFICATION, CATARACT, WITH IOL INSERTION
Anesthesia: Monitor Anesthesia Care | Site: Eye | Laterality: Left

## 2016-04-25 MED ORDER — POVIDONE-IODINE 5 % OP SOLN
OPHTHALMIC | Status: DC | PRN
  Administered 2016-04-25: 1 via OPHTHALMIC

## 2016-04-25 MED ORDER — CYCLOPENTOLATE-PHENYLEPHRINE 0.2-1 % OP SOLN
1.0000 [drp] | OPHTHALMIC | Status: AC
  Administered 2016-04-25 (×3): 1 [drp] via OPHTHALMIC

## 2016-04-25 MED ORDER — LIDOCAINE HCL 3.5 % OP GEL
1.0000 "application " | Freq: Once | OPHTHALMIC | Status: AC
  Administered 2016-04-25: 1 via OPHTHALMIC

## 2016-04-25 MED ORDER — TETRACAINE HCL 0.5 % OP SOLN
1.0000 [drp] | OPHTHALMIC | Status: AC
  Administered 2016-04-25 (×3): 1 [drp] via OPHTHALMIC

## 2016-04-25 MED ORDER — PROVISC 10 MG/ML IO SOLN
INTRAOCULAR | Status: DC | PRN
  Administered 2016-04-25: 0.85 mL via INTRAOCULAR

## 2016-04-25 MED ORDER — EPINEPHRINE PF 1 MG/ML IJ SOLN
INTRAOCULAR | Status: DC | PRN
  Administered 2016-04-25: 500 mL

## 2016-04-25 MED ORDER — PHENYLEPHRINE HCL 2.5 % OP SOLN
1.0000 [drp] | OPHTHALMIC | Status: AC
  Administered 2016-04-25 (×3): 1 [drp] via OPHTHALMIC

## 2016-04-25 MED ORDER — NEOMYCIN-POLYMYXIN-DEXAMETH 3.5-10000-0.1 OP SUSP
OPHTHALMIC | Status: DC | PRN
  Administered 2016-04-25: 2 [drp] via OPHTHALMIC

## 2016-04-25 MED ORDER — MIDAZOLAM HCL 2 MG/2ML IJ SOLN
1.0000 mg | INTRAMUSCULAR | Status: DC | PRN
  Administered 2016-04-25: 2 mg via INTRAVENOUS
  Filled 2016-04-25: qty 2

## 2016-04-25 MED ORDER — EPINEPHRINE PF 1 MG/ML IJ SOLN
INTRAMUSCULAR | Status: AC
  Filled 2016-04-25: qty 1

## 2016-04-25 MED ORDER — LACTATED RINGERS IV SOLN
INTRAVENOUS | Status: DC
  Administered 2016-04-25: 12:00:00 via INTRAVENOUS

## 2016-04-25 MED ORDER — FENTANYL CITRATE (PF) 100 MCG/2ML IJ SOLN
25.0000 ug | INTRAMUSCULAR | Status: AC | PRN
  Administered 2016-04-25 (×2): 25 ug via INTRAVENOUS

## 2016-04-25 MED ORDER — FENTANYL CITRATE (PF) 100 MCG/2ML IJ SOLN
INTRAMUSCULAR | Status: AC
  Filled 2016-04-25: qty 2

## 2016-04-25 MED ORDER — BSS IO SOLN
INTRAOCULAR | Status: DC | PRN
  Administered 2016-04-25: 15 mL

## 2016-04-25 MED ORDER — LIDOCAINE HCL (PF) 1 % IJ SOLN
INTRAMUSCULAR | Status: DC | PRN
  Administered 2016-04-25: .7 mL

## 2016-04-25 SURGICAL SUPPLY — 14 items
CLOTH BEACON ORANGE TIMEOUT ST (SAFETY) ×2 IMPLANT
EYE SHIELD UNIVERSAL CLEAR (GAUZE/BANDAGES/DRESSINGS) ×2 IMPLANT
GLOVE BIOGEL PI IND STRL 6.5 (GLOVE) IMPLANT
GLOVE BIOGEL PI IND STRL 8 (GLOVE) IMPLANT
GLOVE BIOGEL PI INDICATOR 6.5 (GLOVE) ×4
GLOVE BIOGEL PI INDICATOR 8 (GLOVE) ×2
GOWN STRL REUS W/ TWL LRG LVL3 (GOWN DISPOSABLE) IMPLANT
GOWN STRL REUS W/TWL LRG LVL3 (GOWN DISPOSABLE) ×3
PAD ARMBOARD 7.5X6 YLW CONV (MISCELLANEOUS) ×2 IMPLANT
SIGHTPATH CAT PROC W REG LENS (Ophthalmic Related) ×3 IMPLANT
SYRINGE LUER LOK 1CC (MISCELLANEOUS) ×2 IMPLANT
TAPE SURG TRANSPORE 1 IN (GAUZE/BANDAGES/DRESSINGS) IMPLANT
TAPE SURGICAL TRANSPORE 1 IN (GAUZE/BANDAGES/DRESSINGS) ×2
WATER STERILE IRR 250ML POUR (IV SOLUTION) ×2 IMPLANT

## 2016-04-25 NOTE — Op Note (Signed)
Date of Admission: 04/25/2016  Date of Surgery: 04/25/2016  Pre-Op Dx: Cataract Left  Eye  Post-Op Dx: Senile Nuclear Cataract  Left  Eye,  Dx Code H25.12  Surgeon: Tonny Branch, M.D.  Assistants: None  Anesthesia: Topical with MAC  Indications: Painless, progressive loss of vision with compromise of daily activities.  Surgery: Cataract Extraction with Intraocular lens Implant Left Eye  Discription: The patient had dilating drops and viscous lidocaine placed into the Left eye in the pre-op holding area. After transfer to the operating room, a time out was performed. The patient was then prepped and draped. Beginning with a 67 degree blade a paracentesis port was made at the surgeon's 2 o'clock position. The anterior chamber was then filled with 1% non-preserved lidocaine. This was followed by filling the anterior chamber with Provisc.  A 2.21mm keratome blade was used to make a clear corneal incision at the temporal limbus.  A bent cystatome needle was used to create a continuous tear capsulotomy. Hydrodissection was performed with balanced salt solution on a Fine canula. The lens nucleus was then removed using the phacoemulsification handpiece. Residual cortex was removed with the I&A handpiece. The anterior chamber and capsular bag were refilled with Provisc. A posterior chamber intraocular lens was placed into the capsular bag with it's injector. The implant was positioned with the Kuglan hook. The Provisc was then removed from the anterior chamber and capsular bag with the I&A handpiece. Stromal hydration of the main incision and paracentesis port was performed with BSS on a Fine canula. The wounds were tested for leak which was negative. The patient tolerated the procedure well. There were no operative complications. The patient was then transferred to the recovery room in stable condition.  Complications: None  Specimen: None  EBL: None  Prosthetic device: Abbott Technis, PCB00, power  21.5, SN [id number].

## 2016-04-25 NOTE — Anesthesia Preprocedure Evaluation (Signed)
Anesthesia Evaluation  Patient identified by MRN, date of birth, ID band Patient awake    Reviewed: Allergy & Precautions, H&P , NPO status , Patient's Chart, lab work & pertinent test results, reviewed documented beta blocker date and time   Airway Mallampati: II  TM Distance: >3 FB Neck ROM: Full    Dental  (+) Partial Upper, Teeth Intact   Pulmonary neg pulmonary ROS, former smoker,    breath sounds clear to auscultation       Cardiovascular hypertension, Pt. on medications and Pt. on home beta blockers  Rhythm:Regular Rate:Normal     Neuro/Psych    GI/Hepatic negative GI ROS,   Endo/Other  diabetes, Type 2  Renal/GU      Musculoskeletal   Abdominal   Peds  Hematology   Anesthesia Other Findings   Reproductive/Obstetrics                             Anesthesia Physical Anesthesia Plan  ASA: II  Anesthesia Plan: MAC   Post-op Pain Management:    Induction: Intravenous  Airway Management Planned: Nasal Cannula  Additional Equipment:   Intra-op Plan:   Post-operative Plan:   Informed Consent: I have reviewed the patients History and Physical, chart, labs and discussed the procedure including the risks, benefits and alternatives for the proposed anesthesia with the patient or authorized representative who has indicated his/her understanding and acceptance.     Plan Discussed with:   Anesthesia Plan Comments:         Anesthesia Quick Evaluation

## 2016-04-25 NOTE — H&P (Signed)
I have reviewed the H&P, the patient was re-examined, and I have identified no interval changes in medical condition and plan of care since the history and physical of record  

## 2016-04-25 NOTE — Anesthesia Postprocedure Evaluation (Signed)
Anesthesia Post Note  Patient: Cody Palmer  Procedure(s) Performed: Procedure(s) (LRB): CATARACT EXTRACTION PHACO AND INTRAOCULAR LENS PLACEMENT LEFT EYE (Left)  Patient location during evaluation: Short Stay Anesthesia Type: MAC Level of consciousness: awake and alert, oriented and patient cooperative Pain management: pain level controlled Vital Signs Assessment: post-procedure vital signs reviewed and stable Respiratory status: spontaneous breathing, nonlabored ventilation and respiratory function stable Cardiovascular status: blood pressure returned to baseline and stable Postop Assessment: no signs of nausea or vomiting Anesthetic complications: no    Last Vitals:  Vitals:   04/25/16 1220 04/25/16 1235  BP: 129/64 (!) 117/59  Pulse:    Resp: 15 14  Temp:      Last Pain:  Vitals:   04/25/16 1102  TempSrc: Oral                 Joliene Salvador J

## 2016-04-25 NOTE — Discharge Instructions (Signed)

## 2016-04-25 NOTE — Transfer of Care (Signed)
Immediate Anesthesia Transfer of Care Note  Patient: Cody Palmer  Procedure(s) Performed: Procedure(s) with comments: CATARACT EXTRACTION PHACO AND INTRAOCULAR LENS PLACEMENT LEFT EYE (Left) - CDE: 10.37  Patient Location: Short Stay  Anesthesia Type:MAC  Level of Consciousness: awake, alert , oriented and patient cooperative  Airway & Oxygen Therapy: Patient Spontanous Breathing  Post-op Assessment: Report given to RN, Post -op Vital signs reviewed and stable and Patient moving all extremities  Post vital signs: Reviewed and stable  Last Vitals:  Vitals:   04/25/16 1220 04/25/16 1235  BP: 129/64 (!) 117/59  Pulse:    Resp: 15 14  Temp:      Last Pain:  Vitals:   04/25/16 1102  TempSrc: Oral         Complications: No apparent anesthesia complications

## 2016-04-28 ENCOUNTER — Encounter (HOSPITAL_COMMUNITY): Payer: Self-pay | Admitting: Ophthalmology

## 2017-01-23 ENCOUNTER — Ambulatory Visit (INDEPENDENT_AMBULATORY_CARE_PROVIDER_SITE_OTHER): Payer: Medicare Other | Admitting: Orthopedic Surgery

## 2017-01-23 ENCOUNTER — Ambulatory Visit (INDEPENDENT_AMBULATORY_CARE_PROVIDER_SITE_OTHER): Payer: Medicare Other

## 2017-01-23 DIAGNOSIS — Z96651 Presence of right artificial knee joint: Secondary | ICD-10-CM

## 2017-01-23 NOTE — Progress Notes (Signed)
ANNUAL FOLLOW UP FOR  right TKA   Annual follow-up for right total knee. Currently no complaints   HPI: The patient is here for the annual  follow-up x-ray for knee replacement. The patient is not complaining of pain weakness instability or stiffness in the repaired knee.   Review of Systems  Constitutional: Negative for chills and fever.  Musculoskeletal: Negative for joint pain.    Past Medical History:  Diagnosis Date  . Arthritis   . Cancer [facility])    Skin cancer  . Diabetes mellitus without complication ([place])   . HTN (hypertension)   . Hypercholesteremia     Physical Exam Examination of the right  KNEE  There were no vitals taken for this visit.  General the patient is normally groomed in no distress  Mood normal Affect pleasant   The patient is Awake and alert ; oriented normal   Inspection shows : incision healed nicely without erythema, no tenderness no swelling  Range of motion total range of motion is 125 degrees  Stability the knee is stable anterior to posterior as well as medial to lateral  Strength quadriceps strength is normal  Skin no erythema around the skin incision  Cardiovascular NO EDEMA   Neuro: normal sensation in the operative leg   Gait: normal expected gait without cane   Left knee varus alignment no pain flexion past 125   Medical decision-making section  X-rays ordered with the following personal interpretation  Normal alignment without loosening   Diagnosis  Encounter Diagnosis  Name Primary?  . Status post right knee replacement Yes     Plan follow-up 1 year repeat x-rays

## 2017-01-31 ENCOUNTER — Ambulatory Visit (INDEPENDENT_AMBULATORY_CARE_PROVIDER_SITE_OTHER): Payer: Medicare Other | Admitting: Urology

## 2017-01-31 ENCOUNTER — Other Ambulatory Visit (HOSPITAL_COMMUNITY)
Admission: RE | Admit: 2017-01-31 | Discharge: 2017-01-31 | Disposition: A | Discharge status: Home / Self Care | Payer: Medicare Other | Source: Ambulatory Visit | Attending: Urology | Admitting: Urology

## 2017-01-31 ENCOUNTER — Other Ambulatory Visit (HOSPITAL_COMMUNITY)
Admission: RE | Admit: 2017-01-31 | Discharge: 2017-01-31 | Disposition: A | Discharge status: Home / Self Care | Payer: Medicare Other | Source: Other Acute Inpatient Hospital | Attending: Urology | Admitting: Urology

## 2017-01-31 DIAGNOSIS — N323 Diverticulum of bladder: Secondary | ICD-10-CM

## 2017-01-31 DIAGNOSIS — R310 Gross hematuria: Secondary | ICD-10-CM | POA: Insufficient documentation

## 2017-04-26 ENCOUNTER — Encounter: Payer: Self-pay | Admitting: Internal Medicine

## 2018-01-30 ENCOUNTER — Other Ambulatory Visit (HOSPITAL_COMMUNITY)
Admission: RE | Admit: 2018-01-30 | Discharge: 2018-01-30 | Disposition: A | Discharge status: Home / Self Care | Payer: Medicare Other | Source: Other Acute Inpatient Hospital | Attending: Urology | Admitting: Urology

## 2018-01-30 ENCOUNTER — Ambulatory Visit: Payer: Medicare Other | Admitting: Urology

## 2018-01-30 DIAGNOSIS — N400 Enlarged prostate without lower urinary tract symptoms: Secondary | ICD-10-CM

## 2018-01-30 DIAGNOSIS — N323 Diverticulum of bladder: Secondary | ICD-10-CM | POA: Diagnosis not present

## 2018-01-30 DIAGNOSIS — N390 Urinary tract infection, site not specified: Secondary | ICD-10-CM | POA: Diagnosis not present

## 2018-01-30 LAB — URINALYSIS, ROUTINE W REFLEX MICROSCOPIC
BILIRUBIN URINE: NEGATIVE
Bacteria, UA: NONE SEEN
Glucose, UA: NEGATIVE mg/dL
Ketones, ur: NEGATIVE mg/dL
Nitrite: NEGATIVE
Protein, ur: 100 mg/dL — AB
SPECIFIC GRAVITY, URINE: 1.011 (ref 1.005–1.030)
pH: 7 (ref 5.0–8.0)

## 2018-02-01 LAB — URINE CULTURE

## 2019-02-05 ENCOUNTER — Other Ambulatory Visit (HOSPITAL_COMMUNITY)
Admission: RE | Admit: 2019-02-05 | Discharge: 2019-02-05 | Disposition: A | Discharge status: Home / Self Care | Payer: Medicare Other | Source: Ambulatory Visit | Attending: Urology | Admitting: Urology

## 2019-02-05 ENCOUNTER — Ambulatory Visit (INDEPENDENT_AMBULATORY_CARE_PROVIDER_SITE_OTHER): Payer: Medicare Other | Admitting: Urology

## 2019-02-05 DIAGNOSIS — R311 Benign essential microscopic hematuria: Secondary | ICD-10-CM

## 2019-02-05 DIAGNOSIS — N323 Diverticulum of bladder: Secondary | ICD-10-CM | POA: Diagnosis not present

## 2019-02-07 LAB — CYTOLOGY - NON PAP

## 2021-01-17 ENCOUNTER — Emergency Department (HOSPITAL_COMMUNITY)
Admission: EM | Admit: 2021-01-17 | Discharge: 2021-01-17 | Disposition: A | Discharge status: Home / Self Care | Payer: Medicare Other | Attending: Emergency Medicine | Admitting: Emergency Medicine

## 2021-01-17 ENCOUNTER — Other Ambulatory Visit: Payer: Self-pay

## 2021-01-17 DIAGNOSIS — Z96651 Presence of right artificial knee joint: Secondary | ICD-10-CM | POA: Insufficient documentation

## 2021-01-17 DIAGNOSIS — Z7984 Long term (current) use of oral hypoglycemic drugs: Secondary | ICD-10-CM | POA: Diagnosis not present

## 2021-01-17 DIAGNOSIS — U071 COVID-19: Secondary | ICD-10-CM

## 2021-01-17 DIAGNOSIS — Z7982 Long term (current) use of aspirin: Secondary | ICD-10-CM | POA: Diagnosis not present

## 2021-01-17 DIAGNOSIS — A09 Infectious gastroenteritis and colitis, unspecified: Secondary | ICD-10-CM

## 2021-01-17 DIAGNOSIS — Z85828 Personal history of other malignant neoplasm of skin: Secondary | ICD-10-CM | POA: Insufficient documentation

## 2021-01-17 DIAGNOSIS — I10 Essential (primary) hypertension: Secondary | ICD-10-CM | POA: Insufficient documentation

## 2021-01-17 DIAGNOSIS — Z79899 Other long term (current) drug therapy: Secondary | ICD-10-CM | POA: Insufficient documentation

## 2021-01-17 DIAGNOSIS — E119 Type 2 diabetes mellitus without complications: Secondary | ICD-10-CM | POA: Insufficient documentation

## 2021-01-17 DIAGNOSIS — Z87891 Personal history of nicotine dependence: Secondary | ICD-10-CM | POA: Insufficient documentation

## 2021-01-17 DIAGNOSIS — R197 Diarrhea, unspecified: Secondary | ICD-10-CM | POA: Diagnosis present

## 2021-01-17 LAB — URINALYSIS, ROUTINE W REFLEX MICROSCOPIC
Bilirubin Urine: NEGATIVE
Glucose, UA: >=500 mg/dL — AB
Ketones, ur: NEGATIVE mg/dL
Nitrite: NEGATIVE
Protein, ur: NEGATIVE mg/dL
Specific Gravity, Urine: 1.01 (ref 1.005–1.030)
pH: 5.5 (ref 5.0–8.0)

## 2021-01-17 LAB — CBC WITH DIFFERENTIAL/PLATELET
Abs Immature Granulocytes: 0.03 10*3/uL (ref 0.00–0.07)
Basophils Absolute: 0 10*3/uL (ref 0.0–0.1)
Basophils Relative: 0 %
Eosinophils Absolute: 0.1 10*3/uL (ref 0.0–0.5)
Eosinophils Relative: 1 %
HCT: 41.3 % (ref 39.0–52.0)
Hemoglobin: 14 g/dL (ref 13.0–17.0)
Immature Granulocytes: 0 %
Lymphocytes Relative: 17 %
Lymphs Abs: 1.4 10*3/uL (ref 0.7–4.0)
MCH: 28.9 pg (ref 26.0–34.0)
MCHC: 33.9 g/dL (ref 30.0–36.0)
MCV: 85.3 fL (ref 80.0–100.0)
Monocytes Absolute: 0.7 10*3/uL (ref 0.1–1.0)
Monocytes Relative: 8 %
Neutro Abs: 5.9 10*3/uL (ref 1.7–7.7)
Neutrophils Relative %: 74 %
Platelets: 224 10*3/uL (ref 150–400)
RBC: 4.84 MIL/uL (ref 4.22–5.81)
RDW: 14.2 % (ref 11.5–15.5)
WBC: 8.1 10*3/uL (ref 4.0–10.5)
nRBC: 0 % (ref 0.0–0.2)

## 2021-01-17 LAB — COMPREHENSIVE METABOLIC PANEL
ALT: 18 U/L (ref 0–44)
AST: 19 U/L (ref 15–41)
Albumin: 3.9 g/dL (ref 3.5–5.0)
Alkaline Phosphatase: 54 U/L (ref 38–126)
Anion gap: 7 (ref 5–15)
BUN: 22 mg/dL (ref 8–23)
CO2: 24 mmol/L (ref 22–32)
Calcium: 8.6 mg/dL — ABNORMAL LOW (ref 8.9–10.3)
Chloride: 104 mmol/L (ref 98–111)
Creatinine, Ser: 0.74 mg/dL (ref 0.61–1.24)
GFR, Estimated: >60 mL/min (ref >60)
Glucose, Bld: 104 mg/dL — ABNORMAL HIGH (ref 70–99)
Potassium: 3.6 mmol/L (ref 3.5–5.1)
Sodium: 135 mmol/L (ref 135–145)
Total Bilirubin: 1 mg/dL (ref 0.3–1.2)
Total Protein: 6.6 g/dL (ref 6.5–8.1)

## 2021-01-17 LAB — URINALYSIS, MICROSCOPIC (REFLEX)

## 2021-01-17 LAB — RESP PANEL BY RT-PCR (FLU A&B, COVID) ARPGX2
Influenza A by PCR: NEGATIVE
Influenza B by PCR: NEGATIVE
SARS Coronavirus 2 by RT PCR: POSITIVE — AB

## 2021-01-17 MED ORDER — SODIUM CHLORIDE 0.9 % IV BOLUS
1000.0000 mL | Freq: Once | INTRAVENOUS | Status: AC
  Administered 2021-01-17: 1000 mL via INTRAVENOUS

## 2021-01-17 MED ORDER — ONDANSETRON HCL 4 MG/2ML IJ SOLN
4.0000 mg | Freq: Once | INTRAMUSCULAR | Status: AC
  Administered 2021-01-17: 4 mg via INTRAVENOUS
  Filled 2021-01-17: qty 2

## 2021-01-17 NOTE — ED Provider Notes (Signed)
[facility] EMERGENCY DEPARTMENT Provider Note  CSN: [id number] Arrival date & time: 01/17/21 1656    History Chief Complaint  Patient presents with   Diarrhea    Cody Palmer is a 87 y.o. male reports he has had diarrhea for the last week or so, associated with some anorexia, no fevers or abdominal pain. No blood in stool. He had exposure to Covid a few days prior but has not been checked. Not improved with milk of magnesia he took at home.    Past Medical History:  Diagnosis Date   Arthritis    Cancer ([place])    Skin cancer   Diabetes mellitus without complication ([place])    HTN (hypertension)    Hypercholesteremia     Past Surgical History:  Procedure Laterality Date   CATARACT EXTRACTION W/PHACO Left 04/25/2016   Procedure: CATARACT EXTRACTION PHACO AND INTRAOCULAR LENS PLACEMENT LEFT EYE;  Surgeon: Tonny Branch, MD;  Location: [facility];  Service: Ophthalmology;  Laterality: Left;  CDE: 10.37   CYSTOSCOPY WITH BIOPSY N/A 08/13/2013   Procedure: CYSTOSCOPY WITH BLADDER BIOPSY;  Surgeon: Franchot Gallo, MD;  Location: [facility];  Service: Urology;  Laterality: N/A;   ENUCLEATION Right    Removed in [place]   removal of eye   TOTAL KNEE ARTHROPLASTY Right 01/11/2013   Procedure: RIGHT TOTAL KNEE ARTHROPLASTY;  Surgeon: Carole Civil, MD;  Location: [facility];  Service: Orthopedics;  Laterality: Right;    Family History  Problem Relation Age of Onset   Arthritis Unknown    Cancer Unknown    Diabetes Unknown     Social History   Tobacco Use   Smoking status: Former    Packs/day: 1.00    Years: 8.00    Pack years: 8.00    Types: Cigarettes    Quit date: 01/07/1965    Years since quitting: 56.0   Smokeless tobacco: Never  Substance Use Topics   Alcohol use: No   Drug use: No     Home Medications Prior to Admission medications   Medication Sig Start Date End Date Taking? Authorizing Provider  amLODipine (NORVASC) 10 MG tablet Take 10 mg by  mouth daily.   Yes [provider]  aspirin EC 81 MG tablet Take 81 mg by mouth daily.   Yes [provider]  Cholecalciferol (VITAMIN D3) 25 MCG (1000 UT) CAPS Take 1 capsule by mouth daily.   Yes [provider]  JARDIANCE 25 MG TABS tablet Take 12.5 mg by mouth daily. 12/09/20  Yes [provider]  labetalol (NORMODYNE) 300 MG tablet Take 300 mg by mouth 2 (two) times daily. 11/20/20  Yes [provider]  losartan-hydrochlorothiazide (HYZAAR) 100-25 MG per tablet Take 1 tablet by mouth daily.   Yes [provider]  metFORMIN (GLUCOPHAGE) 500 MG tablet Take 500 mg by mouth 2 (two) times daily with a meal.   Yes [provider]  naproxen (NAPROSYN) 500 MG tablet Take 250 mg by mouth daily.  06/12/13  Yes [provider]  rosuvastatin (CRESTOR) 10 MG tablet Take 10 mg by mouth every other day.   Yes [provider]  vitamin C (ASCORBIC ACID) 500 MG tablet Take 500 mg by mouth daily.   Yes [provider]  zinc gluconate 50 MG tablet Take 50 mg by mouth daily.   Yes [provider]  labetalol (NORMODYNE) 200 MG tablet Take 200 mg by mouth 2 (two) times daily.  Patient not taking: Reported on 01/17/2021    [provider]     Allergies    Patient has no known allergies.   Review of Systems   Review of Systems A comprehensive review of systems was completed and negative except as noted in HPI.    Physical Exam BP (!) 122/54   Pulse (!) 52   Temp 98.3 F (36.8 C) (Oral)   Resp (!) 22   Ht '5\' 10"'$  (1.778 m)   Wt 95.3 kg   SpO2 90%   BMI 30.13 kg/m   Physical Exam Vitals and nursing note reviewed.  Constitutional:      Appearance: Normal appearance.  HENT:     Head: Normocephalic and atraumatic.     Nose: Nose normal.     Mouth/Throat:     Mouth: Mucous membranes are moist.  Eyes:     Extraocular Movements: Extraocular movements intact.     Conjunctiva/sclera: Conjunctivae  normal.  Cardiovascular:     Rate and Rhythm: Normal rate.  Pulmonary:     Effort: Pulmonary effort is normal.     Breath sounds: Normal breath sounds.  Abdominal:     General: Abdomen is flat.     Palpations: Abdomen is soft.     Tenderness: There is no abdominal tenderness. There is no guarding.  Musculoskeletal:        General: No swelling. Normal range of motion.     Cervical back: Neck supple.  Skin:    General: Skin is warm and dry.  Neurological:     General: No focal deficit present.     Mental Status: He is alert.  Psychiatric:        Mood and Affect: Mood normal.     ED Results / Procedures / Treatments   Labs (all labs ordered are listed, but only abnormal results are displayed) Labs Reviewed  RESP PANEL BY RT-PCR (FLU A&B, COVID) ARPGX2 - Abnormal; Notable for the following components:      Result Value   SARS Coronavirus 2 by RT PCR POSITIVE (*)    All other components within normal limits  URINALYSIS, ROUTINE W REFLEX MICROSCOPIC - Abnormal; Notable for the following components:   APPearance HAZY (*)    Glucose, UA >=500 (*)    Hgb urine dipstick MODERATE (*)    Leukocytes,Ua MODERATE (*)    All other components within normal limits  COMPREHENSIVE METABOLIC PANEL - Abnormal; Notable for the following components:   Glucose, Bld 104 (*)    Calcium 8.6 (*)    All other components within normal limits  URINALYSIS, MICROSCOPIC (REFLEX) - Abnormal; Notable for the following components:   Bacteria, UA RARE (*)    All other components within normal limits  CBC WITH DIFFERENTIAL/PLATELET    EKG None  Radiology No results found.  Procedures Procedures  Medications Ordered in the ED Medications  ondansetron (ZOFRAN) injection 4 mg (4 mg Intravenous Given 01/17/21 1933)  sodium chloride 0.9 % bolus 1,000 mL (0 mLs Intravenous Stopped 01/17/21 2024)     MDM Rules/Calculators/A&P MDM Patient with diarrhea after Covid exposure which could be the cause. Will  check labs, give IVF and reassess.   ED Course  I have reviewed the triage vital signs and the nursing notes.  Pertinent labs & imaging results that were available during my care of the patient were reviewed by me and considered in my medical decision making (see chart for details).  Clinical Course as of 01/17/21 2307  Sun Jan 17, 2021  1906 CBC is normal.  [CS]  2052 CMP is unremarkable. Covid is positive.  [CS]  2304 UA without signs of infection. Will d/c home with symptomatic care for Covid diarrhea. He is outside the window for Paxlovid. PCP follow up.  [CS]    Clinical Course User Index [CS] Truddie Hidden, MD    Final Clinical Impression(s) / ED Diagnoses Final diagnoses:  Diarrhea of infectious origin  COVID-19    Rx / DC Orders ED Discharge Orders     None        Truddie Hidden, MD 01/17/21 2307

## 2021-01-17 NOTE — ED Triage Notes (Signed)
Pt c/o of diarrhea x 1 week and loss of appetite. Pt had a covid exposure 2 weeks ago.

## 2021-03-08 DIAGNOSIS — E119 Type 2 diabetes mellitus without complications: Secondary | ICD-10-CM | POA: Insufficient documentation

## 2021-03-08 DIAGNOSIS — I10 Essential (primary) hypertension: Secondary | ICD-10-CM | POA: Insufficient documentation

## 2021-03-08 DIAGNOSIS — E785 Hyperlipidemia, unspecified: Secondary | ICD-10-CM | POA: Insufficient documentation

## 2021-12-07 DIAGNOSIS — N1831 Chronic kidney disease, stage 3a: Secondary | ICD-10-CM | POA: Insufficient documentation

## 2022-06-13 ENCOUNTER — Encounter: Payer: Self-pay | Admitting: Orthopedic Surgery

## 2022-06-13 ENCOUNTER — Ambulatory Visit (INDEPENDENT_AMBULATORY_CARE_PROVIDER_SITE_OTHER): Payer: Medicare Other | Admitting: Orthopedic Surgery

## 2022-06-13 ENCOUNTER — Ambulatory Visit (INDEPENDENT_AMBULATORY_CARE_PROVIDER_SITE_OTHER): Payer: Medicare Other

## 2022-06-13 VITALS — BP 163/73 | HR 80 | Ht 69.0 in | Wt 207.0 lb

## 2022-06-13 DIAGNOSIS — G8929 Other chronic pain: Secondary | ICD-10-CM

## 2022-06-13 DIAGNOSIS — M1712 Unilateral primary osteoarthritis, left knee: Secondary | ICD-10-CM

## 2022-06-13 NOTE — Progress Notes (Signed)
Chief Complaint  Patient presents with   New Patient (Initial Visit)   Knee Pain    LT knee/ more giving way symptoms than pain    89 year old male with hypertension diabetes under control presents with weakness of his left lower extremity and giving way of his left knee.  He is now requiring 2 canes to get around.  He is avid golfer wants to return to his golf activities  He does not complain of pain just weakness and giving way of the left knee  As I recall as I treated his right knee he never had a lot of pain in the knee just varus deformity and loss of function  BP (!) 163/73   Pulse 80   Ht '5\' 9"'$  (1.753 m)   Wt 207 lb (93.9 kg)   BMI 30.57 kg/m   Physical Exam Vitals and nursing note reviewed.  Constitutional:      Appearance: Normal appearance.  HENT:     Head: Normocephalic and atraumatic.  Eyes:     General: No scleral icterus.       Right eye: No discharge.        Left eye: No discharge.     Extraocular Movements: Extraocular movements intact.     Conjunctiva/sclera: Conjunctivae normal.     Pupils: Pupils are equal, round, and reactive to light.  Cardiovascular:     Rate and Rhythm: Normal rate.     Pulses: Normal pulses.  Musculoskeletal:     Comments: Left knee is in severe varus its fixed varus.  He has a flexion contracture.  He can bend the knee at 110 degrees.  The knee is stable anterior posterior and medial lateral    Skin:    General: Skin is warm and dry.     Capillary Refill: Capillary refill takes less than 2 seconds.  Neurological:     General: No focal deficit present.     Mental Status: He is alert and oriented to person, place, and time.     Gait: Gait abnormal.  Psychiatric:        Mood and Affect: Mood normal.        Behavior: Behavior normal.        Thought Content: Thought content normal.        Judgment: Judgment normal.     Imaging shows grade 4 arthritis large varus deformity some bone loss proximally.  He has osteophyte  surrounding the joint this is grade 4 disease his medial compartment is completely compromised  The patient will have a preop clearance with his medical doctor his A1c has been good with 5-6.0 readings  His age should not be an issue as long as his health is good we can proceed with left total knee at his convenience

## 2022-06-16 ENCOUNTER — Telehealth: Payer: Self-pay | Admitting: Radiology

## 2022-06-16 NOTE — Telephone Encounter (Signed)
-----  Message from Carole Civil, MD sent at 06/16/2022  1:40 PM EST ----- Feb 27

## 2022-06-16 NOTE — Telephone Encounter (Signed)
Will put in orders for surgery TKR for 07/12/22 and will send clearance letter to his pcp

## 2022-06-17 ENCOUNTER — Other Ambulatory Visit: Payer: Self-pay | Admitting: Radiology

## 2022-06-17 DIAGNOSIS — M1712 Unilateral primary osteoarthritis, left knee: Secondary | ICD-10-CM

## 2022-06-17 DIAGNOSIS — Z01818 Encounter for other preprocedural examination: Secondary | ICD-10-CM

## 2022-06-17 MED ORDER — BUPIVACAINE-MELOXICAM ER 400-12 MG/14ML IJ SOLN: 400.0000 mg | Freq: Once | INTRAMUSCULAR | Status: DC

## 2022-06-17 NOTE — Telephone Encounter (Signed)
Put in orders He will need to d/c his Jardiance for 3 days and get medical clearance I sent letter to [facility] for him /told him about Vania Rea he voiced understanding states he will call primary care today regarding clearance.

## 2022-06-29 ENCOUNTER — Other Ambulatory Visit: Payer: Self-pay | Admitting: Orthopedic Surgery

## 2022-06-29 DIAGNOSIS — M1712 Unilateral primary osteoarthritis, left knee: Secondary | ICD-10-CM

## 2022-07-06 NOTE — Patient Instructions (Signed)
Cody Palmer  07/06/2022     @[email or web address]$ @   Your procedure is scheduled on  07/12/2022.   Report to [facility] at  0830  A.M.   Call this number if you have problems the morning of surgery:  [phone]  If you experience any cold or flu symptoms such as cough, fever, chills, shortness of breath, etc. between now and your scheduled surgery, please notify us at the above number.   Remember:  Do not eat or drink after midnight.       Your last dose of jardiance should be on 07/09/2022.     DO NOT take any medications for diabetes the morning of your procedure.     Take these medicines the morning of surgery with A SIP OF WATER                                amlodipine, labetolol.    Do not wear jewelry, make-up or nail polish.  Do not wear lotions, powders, or perfumes, or deodorant.  Do not shave 48 hours prior to surgery.  Men may shave face and neck.  Do not bring valuables to the hospital.  [facility] is not responsible for any belongings or valuables.  Contacts, dentures or bridgework may not be worn into surgery.  Leave your suitcase in the car.  After surgery it may be brought to your room.  For patients admitted to the hospital, discharge time will be determined by your treatment team.  Patients discharged the day of surgery will not be allowed to drive home.    Special instructions:   DO NOT smoke tobacco or vape for 24 hours before your procedure.  Please read over the following fact sheets that you were given. Pain Booklet, Coughing and Deep Breathing, Blood Transfusion Information, Total Joint Packet, Surgical Site Infection Prevention, Anesthesia Post-op Instructions, and Care and Recovery After Surgery      Total Knee Replacement, Care After This sheet gives you information about how to care for yourself after your procedure. Your health care provider may also give you more specific instructions. If you have problems or  questions, contact your health care provider. What can I expect after the procedure? After the procedure, it is common to have: Redness, pain, and swelling at the incision area. Stiffness. Discomfort. A small amount of blood or clear fluid coming from your incision. Follow these instructions at home: Medicines Take over-the-counter and prescription medicines only as told by your health care provider. If you were prescribed a blood thinner (anticoagulant), take it as told by your health care provider. Ask your health care provider if the medicine prescribed to you: Requires you to avoid driving or using machinery. Can cause constipation. You may need to take these actions to prevent or treat constipation: Drink enough fluid to keep your urine pale yellow. Take over-the-counter or prescription medicines. Eat foods that are high in fiber, such as beans, whole grains, and fresh fruits and vegetables. Limit foods that are high in fat and processed sugars, such as fried or sweet foods. Incision care  Follow instructions from your health care provider about how to take care of your incision. Make sure you: Wash your hands with soap and water for at least 20 seconds before and after you change your bandage (dressing). If soap and water are not available, use hand sanitizer. Change your  dressing as told by your health care provider. Leave stitches (sutures), staples, skin glue, or adhesive strips in place. These skin closures may need to stay in place for 2 weeks or longer. If adhesive strip edges start to loosen and curl up, you may trim the loose edges. Do not remove adhesive strips completely unless your health care provider tells you to do that. Do not take baths, swim, or use a hot tub until your health care provider approves. Check your incision area every day for signs of infection. Check for: More redness, swelling, or pain. More fluid or blood. Warmth. Pus or a bad smell. Activity Rest  as told by your health care provider. Avoid sitting for a long time without moving. Get up to take short walks every 1-2 hours. This is important to improve blood flow and breathing. Ask for help if you feel weak or unsteady. Follow instructions from your health care provider about using a walker, crutches, or a cane. You may use your legs to support (bear) your body weight as told by your health care provider. Follow instructions about how much weight you may safely support on your affected leg (weight-bearing restrictions). A physical therapist may show you how to get out of a bed and chair and how to go up and down stairs. You will first do this with a walker, crutches, or a cane and then without any of these devices. Once you are able to walk without a limp, you may stop using a walker, crutches, or a cane. Do exercises as told by your health care provider or physical therapist. Avoid high-impact activities, including running, jumping rope, and doing jumping jacks. Do not play contact sports until your health care provider approves. Return to your normal activities as told by your health care provider. Ask your health care provider what activities are safe for you. Managing pain, stiffness, and swelling  If directed, put ice on your knee. To do this: Put ice in a plastic bag or use the icing device (cold flow pad) that you were given. Follow instructions from your health care provider about how to use the icing device. Place a towel between your skin and the bag or between your skin and the icing device. Leave the ice on for 20 minutes, 2-3 times a day. Remove the ice if your skin turns bright red. This is very important. If you cannot feel pain, heat, or cold, you have a greater risk of damage to the area. Move your toes often to reduce stiffness and swelling. Raise (elevate) your leg above the level of your heart while you are sitting or lying down. Use several pillows to keep your leg  straight. Do not put a pillow just under the knee. If the knee is bent for a long time, this may lead to stiffness. Wear elastic knee support as told by your health care provider. Safety  To help prevent falls, keep floors clear of objects you may trip over. Place items that you may need within easy reach. Wear an apron or tool belt with pockets for carrying objects. This leaves your hands free to help with your balance. Ask your health care provider when it is safe to drive. General instructions Wear compression stockings as told by your health care provider. These stockings help to prevent blood clots and reduce swelling in your legs. Continue with breathing exercises. This helps prevent lung infection. Do not use any products that contain nicotine or tobacco. These products include cigarettes,  chewing tobacco, and vaping devices, such as e-cigarettes. These can delay healing after surgery. If you need help quitting, ask your health care provider. Tell your health care provider if you plan to have dental work. Also: Tell your dentist about your joint replacement. Ask your health care provider if there are any special instructions you need to follow before having dental care and routine cleanings. Keep all follow-up visits. This is important. Contact a health care provider if: You have a fever or chills. You have a cough or feel short of breath. Your medicine is not controlling your pain. You have any of these signs of infection: More redness, swelling, or pain around your incision. More fluid or blood coming from your incision. Warmth coming from your incision. Pus or a bad smell coming from your incision. You fall. Get help right away if: You have severe pain. You have trouble breathing. You have chest pain. You have redness, swelling, pain, or warmth in your calf or leg. Your incision breaks open after sutures or staples are removed. These symptoms may represent a serious problem  that is an emergency. Do not wait to see if the symptoms will go away. Get medical help right away. Call your local emergency services (911 in the [place] Do not drive yourself to the hospital. Summary After the procedure, it is common to have pain and swelling at the incision area, a small amount of blood or fluid coming from your incision, and stiffness. Follow instructions from your health care provider about how to take care of your incision. Use crutches, a walker, or a cane as told by your health care provider. This information is not intended to replace advice given to you by your health care provider. Make sure you discuss any questions you have with your health care provider. Document Revised: 10/22/2019 Document Reviewed: 10/22/2019 Elsevier Patient Education  [place]. Spinal Anesthesia and Epidural Anesthesia, Care After This sheet gives you information about how to care for yourself after your procedure. Your doctor may also give you more specific instructions. If you have problems or questions, contact your doctor. What can I expect after the procedure? While the medicines you were given are still having effects, it is common to: Feel like you may vomit (nauseous). Vomit. Have a numb feeling or tingling in your legs. Have problems when you pee (urinate). Feel itchy. Follow these instructions at home: For the time period you were told by your doctor:  Rest. Do not do activities where you could fall or get hurt. Do not drive or use machines. Do not drink alcohol. Do not take sleeping pills or medicines that make you drowsy. Do not make big decisions or sign legal documents. Do not take care of children on your own. Eating and drinking If you vomit, wait a short time. When you can drink without vomiting, try to drink some water, juice, or clear soup. Drink enough fluid to keep your pee (urine) pale yellow. Make sure you do not feel like vomiting before you eat solid  foods. Follow the diet that your doctor recommends. General instructions If you have sleep apnea, surgery and certain medicines can raise your risk for breathing problems. Follow instructions from your doctor about when to wear your sleep device. Have a responsible adult stay with you for the time you are told. It is important to have someone help care for you until you are awake and alert. Return to your normal activities as told by your doctor.  Ask your doctor what activities are safe for you. Take over-the-counter and prescription medicines only as told by your doctor. Do not use any products that contain nicotine or tobacco, such as cigarettes, e-cigarettes, and chewing tobacco. If you need help quitting, ask your doctor. Keep all follow-up visits as told by your doctor. This is important. Contact a doctor if: It has been more than one day since your procedure and you feel like you may vomit or you are vomiting. You have a rash. Get help right away if: You have a fever. You have a headache that lasts a long time. You have a very bad headache. Your vision is blurry or you see two of a single object (double vision). You are dizzy or light-headed. You faint. Your arms or legs tingle, feel weak, or get numb. You have trouble breathing. You cannot pee. These symptoms may be an emergency. Get help right away. Call your local emergency services (911 in the [place] Do not wait to see if the symptoms will go away. Do not drive yourself to the hospital. Summary After the procedure, have a responsible adult stay with you at home. Do not do activities that might cause you to get hurt. Do not drive, use machinery, drink alcohol, or make important decisions as told by your doctor. Do not use products that contain nicotine or tobacco. Get help right away if you have a very bad headache, trouble breathing, or you cannot pee. This information is not intended to replace advice given to you by your  health care provider. Make sure you discuss any questions you have with your health care provider. Document Revised: 01/16/2020 Document Reviewed: 06/20/2019 Elsevier Patient Education  [place] Anesthesia, Adult, Care After The following information offers guidance on how to care for yourself after your procedure. Your health care provider may also give you more specific instructions. If you have problems or questions, contact your health care provider. What can I expect after the procedure? After the procedure, it is common for people to: Have pain or discomfort at the IV site. Have nausea or vomiting. Have a sore throat or hoarseness. Have trouble concentrating. Feel cold or chills. Feel weak, sleepy, or tired (fatigue). Have soreness and body aches. These can affect parts of the body that were not involved in surgery. Follow these instructions at home: For the time period you were told by your health care provider:  Rest. Do not participate in activities where you could fall or become injured. Do not drive or use machinery. Do not drink alcohol. Do not take sleeping pills or medicines that cause drowsiness. Do not make important decisions or sign legal documents. Do not take care of children on your own. General instructions Drink enough fluid to keep your urine pale yellow. If you have sleep apnea, surgery and certain medicines can increase your risk for breathing problems. Follow instructions from your health care provider about wearing your sleep device: Anytime you are sleeping, including during daytime naps. While taking prescription pain medicines, sleeping medicines, or medicines that make you drowsy. Return to your normal activities as told by your health care provider. Ask your health care provider what activities are safe for you. Take over-the-counter and prescription medicines only as told by your health care provider. Do not use any products that  contain nicotine or tobacco. These products include cigarettes, chewing tobacco, and vaping devices, such as e-cigarettes. These can delay incision healing after surgery. If you need help quitting,  ask your health care provider. Contact a health care provider if: You have nausea or vomiting that does not get better with medicine. You vomit every time you eat or drink. You have pain that does not get better with medicine. You cannot urinate or have bloody urine. You develop a skin rash. You have a fever. Get help right away if: You have trouble breathing. You have chest pain. You vomit blood. These symptoms may be an emergency. Get help right away. Call 911. Do not wait to see if the symptoms will go away. Do not drive yourself to the hospital. Summary After the procedure, it is common to have a sore throat, hoarseness, nausea, vomiting, or to feel weak, sleepy, or fatigue. For the time period you were told by your health care provider, do not drive or use machinery. Get help right away if you have difficulty breathing, have chest pain, or vomit blood. These symptoms may be an emergency. This information is not intended to replace advice given to you by your health care provider. Make sure you discuss any questions you have with your health care provider. Document Revised: 07/30/2021 Document Reviewed: 07/30/2021 Elsevier Patient Education  [place]. How to Use Chlorhexidine Before Surgery Chlorhexidine gluconate (CHG) is a germ-killing (antiseptic) solution that is used to clean the skin. It can get rid of the bacteria that normally live on the skin and can keep them away for about 24 hours. To clean your skin with CHG, you may be given: A CHG solution to use in the shower or as part of a sponge bath. A prepackaged cloth that contains CHG. Cleaning your skin with CHG may help lower the risk for infection: While you are staying in the intensive care unit of the hospital. If you  have a vascular access, such as a central line, to provide short-term or long-term access to your veins. If you have a catheter to drain urine from your bladder. If you are on a ventilator. A ventilator is a machine that helps you breathe by moving air in and out of your lungs. After surgery. What are the risks? Risks of using CHG include: A skin reaction. Hearing loss, if CHG gets in your ears and you have a perforated eardrum. Eye injury, if CHG gets in your eyes and is not rinsed out. The CHG product catching fire. Make sure that you avoid smoking and flames after applying CHG to your skin. Do not use CHG: If you have a chlorhexidine allergy or have previously reacted to chlorhexidine. On babies younger than 71 months of age. How to use CHG solution Use CHG only as told by your health care provider, and follow the instructions on the label. Use the full amount of CHG as directed. Usually, this is one bottle. During a shower Follow these steps when using CHG solution during a shower (unless your health care provider gives you different instructions): Start the shower. Use your normal soap and shampoo to wash your face and hair. Turn off the shower or move out of the shower stream. Pour the CHG onto a clean washcloth. Do not use any type of brush or rough-edged sponge. Starting at your neck, lather your body down to your toes. Make sure you follow these instructions: If you will be having surgery, pay special attention to the part of your body where you will be having surgery. Scrub this area for at least 1 minute. Do not use CHG on your head or face. If  the solution gets into your ears or eyes, rinse them well with water. Avoid your genital area. Avoid any areas of skin that have broken skin, cuts, or scrapes. Scrub your back and under your arms. Make sure to wash skin folds. Let the lather sit on your skin for 1-2 minutes or as long as told by your health care provider. Thoroughly  rinse your entire body in the shower. Make sure that all body creases and crevices are rinsed well. Dry off with a clean towel. Do not put any substances on your body afterward--such as powder, lotion, or perfume--unless you are told to do so by your health care provider. Only use lotions that are recommended by the manufacturer. Put on clean clothes or pajamas. If it is the night before your surgery, sleep in clean sheets.  During a sponge bath Follow these steps when using CHG solution during a sponge bath (unless your health care provider gives you different instructions): Use your normal soap and shampoo to wash your face and hair. Pour the CHG onto a clean washcloth. Starting at your neck, lather your body down to your toes. Make sure you follow these instructions: If you will be having surgery, pay special attention to the part of your body where you will be having surgery. Scrub this area for at least 1 minute. Do not use CHG on your head or face. If the solution gets into your ears or eyes, rinse them well with water. Avoid your genital area. Avoid any areas of skin that have broken skin, cuts, or scrapes. Scrub your back and under your arms. Make sure to wash skin folds. Let the lather sit on your skin for 1-2 minutes or as long as told by your health care provider. Using a different clean, wet washcloth, thoroughly rinse your entire body. Make sure that all body creases and crevices are rinsed well. Dry off with a clean towel. Do not put any substances on your body afterward--such as powder, lotion, or perfume--unless you are told to do so by your health care provider. Only use lotions that are recommended by the manufacturer. Put on clean clothes or pajamas. If it is the night before your surgery, sleep in clean sheets. How to use CHG prepackaged cloths Only use CHG cloths as told by your health care provider, and follow the instructions on the label. Use the CHG cloth on clean, dry  skin. Do not use the CHG cloth on your head or face unless your health care provider tells you to. When washing with the CHG cloth: Avoid your genital area. Avoid any areas of skin that have broken skin, cuts, or scrapes. Before surgery Follow these steps when using a CHG cloth to clean before surgery (unless your health care provider gives you different instructions): Using the CHG cloth, vigorously scrub the part of your body where you will be having surgery. Scrub using a back-and-forth motion for 3 minutes. The area on your body should be completely wet with CHG when you are done scrubbing. Do not rinse. Discard the cloth and let the area air-dry. Do not put any substances on the area afterward, such as powder, lotion, or perfume. Put on clean clothes or pajamas. If it is the night before your surgery, sleep in clean sheets.  For general bathing Follow these steps when using CHG cloths for general bathing (unless your health care provider gives you different instructions). Use a separate CHG cloth for each area of your body. Make sure  you wash between any folds of skin and between your fingers and toes. Wash your body in the following order, switching to a new cloth after each step: The front of your neck, shoulders, and chest. Both of your arms, under your arms, and your hands. Your stomach and groin area, avoiding the genitals. Your right leg and foot. Your left leg and foot. The back of your neck, your back, and your buttocks. Do not rinse. Discard the cloth and let the area air-dry. Do not put any substances on your body afterward--such as powder, lotion, or perfume--unless you are told to do so by your health care provider. Only use lotions that are recommended by the manufacturer. Put on clean clothes or pajamas. Contact a health care provider if: Your skin gets irritated after scrubbing. You have questions about using your solution or cloth. You swallow any chlorhexidine. Call  your local poison control center (1-339 557 5454 in the [place] Get help right away if: Your eyes itch badly, or they become very red or swollen. Your skin itches badly and is red or swollen. Your hearing changes. You have trouble seeing. You have swelling or tingling in your mouth or throat. You have trouble breathing. These symptoms may represent a serious problem that is an emergency. Do not wait to see if the symptoms will go away. Get medical help right away. Call your local emergency services (911 in the [place] Do not drive yourself to the hospital. Summary Chlorhexidine gluconate (CHG) is a germ-killing (antiseptic) solution that is used to clean the skin. Cleaning your skin with CHG may help to lower your risk for infection. You may be given CHG to use for bathing. It may be in a bottle or in a prepackaged cloth to use on your skin. Carefully follow your health care provider's instructions and the instructions on the product label. Do not use CHG if you have a chlorhexidine allergy. Contact your health care provider if your skin gets irritated after scrubbing. This information is not intended to replace advice given to you by your health care provider. Make sure you discuss any questions you have with your health care provider. Document Revised: 08/30/2021 Document Reviewed: 07/13/2020 Elsevier Patient Education  [place].

## 2022-07-06 NOTE — Pre-Procedure Instructions (Signed)
Messaged [name] Littrell about medical clearance.

## 2022-07-07 ENCOUNTER — Encounter (HOSPITAL_COMMUNITY)
Admission: RE | Admit: 2022-07-07 | Discharge: 2022-07-07 | Disposition: A | Discharge status: Home / Self Care | Payer: Medicare Other | Source: Ambulatory Visit | Attending: Orthopedic Surgery | Admitting: Orthopedic Surgery

## 2022-07-07 VITALS — BP 131/64 | HR 69 | Temp 98.4°F | Resp 18 | Ht 69.0 in | Wt 207.0 lb

## 2022-07-07 DIAGNOSIS — Z01818 Encounter for other preprocedural examination: Secondary | ICD-10-CM | POA: Insufficient documentation

## 2022-07-07 DIAGNOSIS — E119 Type 2 diabetes mellitus without complications: Secondary | ICD-10-CM | POA: Insufficient documentation

## 2022-07-07 DIAGNOSIS — M1712 Unilateral primary osteoarthritis, left knee: Secondary | ICD-10-CM

## 2022-07-07 DIAGNOSIS — I10 Essential (primary) hypertension: Secondary | ICD-10-CM

## 2022-07-07 LAB — CBC WITH DIFFERENTIAL/PLATELET
Abs Immature Granulocytes: 0.03 10*3/uL (ref 0.00–0.07)
Basophils Absolute: 0.1 10*3/uL (ref 0.0–0.1)
Basophils Relative: 1 %
Eosinophils Absolute: 0.2 10*3/uL (ref 0.0–0.5)
Eosinophils Relative: 2 %
HCT: 40.8 % (ref 39.0–52.0)
Hemoglobin: 13.4 g/dL (ref 13.0–17.0)
Immature Granulocytes: 0 %
Lymphocytes Relative: 21 %
Lymphs Abs: 1.7 10*3/uL (ref 0.7–4.0)
MCH: 28.2 pg (ref 26.0–34.0)
MCHC: 32.8 g/dL (ref 30.0–36.0)
MCV: 85.7 fL (ref 80.0–100.0)
Monocytes Absolute: 0.8 10*3/uL (ref 0.1–1.0)
Monocytes Relative: 10 %
Neutro Abs: 5.3 10*3/uL (ref 1.7–7.7)
Neutrophils Relative %: 66 %
Platelets: 228 10*3/uL (ref 150–400)
RBC: 4.76 MIL/uL (ref 4.22–5.81)
RDW: 14.5 % (ref 11.5–15.5)
WBC: 8 10*3/uL (ref 4.0–10.5)
nRBC: 0 % (ref 0.0–0.2)

## 2022-07-07 LAB — BASIC METABOLIC PANEL
Anion gap: 12 (ref 5–15)
BUN: 24 mg/dL — ABNORMAL HIGH (ref 8–23)
CO2: 22 mmol/L (ref 22–32)
Calcium: 9 mg/dL (ref 8.9–10.3)
Chloride: 96 mmol/L — ABNORMAL LOW (ref 98–111)
Creatinine, Ser: 1.04 mg/dL (ref 0.61–1.24)
GFR, Estimated: >60 mL/min (ref >60)
Glucose, Bld: 112 mg/dL — ABNORMAL HIGH (ref 70–99)
Potassium: 3.8 mmol/L (ref 3.5–5.1)
Sodium: 130 mmol/L — ABNORMAL LOW (ref 135–145)

## 2022-07-07 LAB — SURGICAL PCR SCREEN
MRSA, PCR: NEGATIVE
Staphylococcus aureus: POSITIVE — AB

## 2022-07-07 LAB — PREPARE RBC (CROSSMATCH)

## 2022-07-07 LAB — HEMOGLOBIN A1C
Hgb A1c MFr Bld: 6.3 % — ABNORMAL HIGH (ref 4.8–5.6)
Mean Plasma Glucose: 134.11 mg/dL

## 2022-07-08 NOTE — H&P (Signed)
TOTAL KNEE ADMISSION H&P  Patient is being admitted for left total knee arthroplasty.  Subjective:  Chief Complaint:left knee pain.  HPI: Cody Palmer, 89 y.o. male, he has hypertension diabetes which is under control presents with weakness of his left lower extremity is his main complaint with giving way of the left knee and difficulty with activities of daily living requiring 2 canes to get around.  He is unable to golf the way he wants to.  He does not really have a lot of pain in the knee.  He has no active sign of infection  Over-the-counter medication has not helped.  Activity modification has not helped.   Patient Active Problem List   Diagnosis Date Noted   Osteoarthrosis, unspecified whether generalized or localized, lower leg 12/31/2013   S/P total knee replacement 02/21/2013   Stiffness of right knee 01/28/2013   Weakness of right leg 01/28/2013   Difficulty in walking(719.7) 01/28/2013   Arthritis of knee, degenerative 12/13/2012   Past Medical History:  Diagnosis Date   Arthritis    Cancer ([place])    Skin cancer   Diabetes mellitus without complication ([place])    HTN (hypertension)    Hypercholesteremia     Past Surgical History:  Procedure Laterality Date   CATARACT EXTRACTION W/PHACO Left 04/25/2016   Procedure: CATARACT EXTRACTION PHACO AND INTRAOCULAR LENS PLACEMENT LEFT EYE;  Surgeon: Tonny Branch, MD;  Location: [facility];  Service: Ophthalmology;  Laterality: Left;  CDE: 10.37   CYSTOSCOPY WITH BIOPSY N/A 08/13/2013   Procedure: CYSTOSCOPY WITH BLADDER BIOPSY;  Surgeon: Franchot Gallo, MD;  Location: [facility];  Service: Urology;  Laterality: N/A;   ENUCLEATION Right    Removed in [place]   removal of eye   TOTAL KNEE ARTHROPLASTY Right 01/11/2013   Procedure: RIGHT TOTAL KNEE ARTHROPLASTY;  Surgeon: Carole Civil, MD;  Location: [facility];  Service: Orthopedics;  Laterality: Right;    Current Facility-Administered Medications   Medication Dose Route Frequency Provider Last Rate Last Admin   bupivacaine-meloxicam ER (ZYNRELEF) injection 400 mg  400 mg Infiltration Once Carole Civil, MD       Current Outpatient Medications  Medication Sig Dispense Refill Last Dose   amLODipine (NORVASC) 10 MG tablet Take 10 mg by mouth daily.      aspirin EC 81 MG tablet Take 81 mg by mouth daily.      Cholecalciferol (VITAMIN D3) 25 MCG (1000 UT) CAPS Take 1,000 Units by mouth daily.      JARDIANCE 25 MG TABS tablet Take 12.5 mg by mouth daily.      labetalol (NORMODYNE) 300 MG tablet Take 300 mg by mouth 2 (two) times daily.      losartan-hydrochlorothiazide (HYZAAR) 100-25 MG per tablet Take 1 tablet by mouth daily.      metFORMIN (GLUCOPHAGE) 500 MG tablet Take 500 mg by mouth 2 (two) times daily with a meal.      rosuvastatin (CRESTOR) 10 MG tablet Take 10 mg by mouth every other day.      vitamin C (ASCORBIC ACID) 500 MG tablet Take 500 mg by mouth daily.      zinc gluconate 50 MG tablet Take 50 mg by mouth daily.      No Known Allergies  Social History   Tobacco Use   Smoking status: Former    Packs/day: 1.00    Years: 8.00    Total pack years: 8.00    Types: Cigarettes  Quit date: 01/07/1965    Years since quitting: 57.5   Smokeless tobacco: Never  Substance Use Topics   Alcohol use: No    Family History  Problem Relation Age of Onset   Arthritis Unknown    Cancer Unknown    Diabetes Unknown      Review of Systems  Constitutional:  Negative for fever.  HENT:  Negative for congestion.   Respiratory:  Negative for shortness of breath.   Cardiovascular:  Negative for chest pain and palpitations.  Gastrointestinal: Negative.   Genitourinary:  Negative for urgency.  Skin: Negative.   Neurological:  Positive for weakness.  Psychiatric/Behavioral: Negative.    All other systems reviewed and are negative.   Objective:  Physical Exam  Vital signs in last 24 hours: Temp:  [98.4 F (36.9 C)]  98.4 F (36.9 C) (02/22 1253) Pulse Rate:  [69] 69 (02/22 1253) Resp:  [18] 18 (02/22 1253) BP: (131)/(64) 131/64 (02/22 1253) SpO2:  [96 %] 96 % (02/22 1253) Weight:  [93.9 kg] 93.9 kg (02/22 1253)  Appearance was normal  HEENT normal cephalic atraumatic  Eyes no scleral icterus or discharge extraocular muscles intact no scleral conjunctival jaundice or erythema.  Pupils equal round and reactive to light  Rate and rhythm cardiac normal pulses normal  Musculoskeletal the right knee had a right total knee which is functioning well no problems there  Left knee is in severe varus its fixed he has a flexion contracture however the knee will bend to 110 degrees and is stable anterior posterior and medial lateral  Skin is warm and dry intact no erythema capillary refill is good takes less than 10 seconds  He has no focal neurologic deficits he is alert and oriented x 3 gait is abnormal he has severe varus alignment to his knee it is a fixed varus deformity  His mood and affect were normal his behavior and thought content were normal judgment was normal   Labs:     Latest Ref Rng & Units 07/07/2022   12:55 PM 01/17/2021    6:50 PM 04/19/2016    2:59 PM  CBC  WBC 4.0 - 10.5 K/uL 8.0  8.1  8.7   Hemoglobin 13.0 - 17.0 g/dL 13.4  14.0  13.8   Hematocrit 39.0 - 52.0 % 40.8  41.3  40.7   Platelets 150 - 400 K/uL 228  224  223        Latest Ref Rng & Units 07/07/2022   12:55 PM 01/17/2021    7:35 PM 04/19/2016    2:59 PM  BMP  Glucose 70 - 99 mg/dL 112  104  126   BUN 8 - 23 mg/dL '24  22  24   '$ Creatinine 0.61 - 1.24 mg/dL 1.04  0.74  0.90   Sodium 135 - 145 mmol/L 130  135  133   Potassium 3.5 - 5.1 mmol/L 3.8  3.6  3.7   Chloride 98 - 111 mmol/L 96  104  100   CO2 22 - 32 mmol/L '22  24  25   '$ Calcium 8.9 - 10.3 mg/dL 9.0  8.6  9.1     Hemoglobin A1c was 6.3   Estimated body mass index is 30.57 kg/m as calculated from the following:   Height as of 07/07/22: '5\' 9"'$  (1.753 m).    Weight as of 07/07/22: 93.9 kg.   Imaging Review Bone quality is good  Large varus deformity some proximal bone loss medially grade 4 disease  is definitely present with osteophyte formation subchondral sclerosis and basically severe arthritis  He has been cleared by his medical doctor      Assessment/Plan:  End stage arthritis, left knee   Left total knee arthroplasty  The patient history, physical examination, clinical judgment of the provider and imaging studies are consistent with end stage degenerative joint disease of the left knee(s) and total knee arthroplasty is deemed medically necessary. The treatment options including medical management, injection therapy arthroscopy and arthroplasty were discussed at length. The risks and benefits of total knee arthroplasty were presented and reviewed. The risks due to aseptic loosening, infection, stiffness, patella tracking problems, thromboembolic complications and other imponderables were discussed. The patient acknowledged the explanation, agreed to proceed with the plan and consent was signed. Patient is being admitted for inpatient treatment for surgery, pain control, PT, OT, prophylactic antibiotics, VTE prophylaxis, progressive ambulation and ADL's and discharge planning. The patient is planning to be discharged home with home health services

## 2022-07-12 ENCOUNTER — Encounter (HOSPITAL_COMMUNITY): Payer: Self-pay | Admitting: Orthopedic Surgery

## 2022-07-12 ENCOUNTER — Ambulatory Visit (HOSPITAL_BASED_OUTPATIENT_CLINIC_OR_DEPARTMENT_OTHER): Payer: Medicare Other | Admitting: Anesthesiology

## 2022-07-12 ENCOUNTER — Ambulatory Visit (HOSPITAL_COMMUNITY): Payer: Medicare Other

## 2022-07-12 ENCOUNTER — Ambulatory Visit (HOSPITAL_COMMUNITY): Payer: Medicare Other | Admitting: Anesthesiology

## 2022-07-12 ENCOUNTER — Encounter (HOSPITAL_COMMUNITY)
Admission: RE | Disposition: A | Discharge status: Home Health | Payer: Self-pay | Source: Home / Self Care | Attending: Orthopedic Surgery

## 2022-07-12 ENCOUNTER — Encounter: Payer: Self-pay | Admitting: Orthopedic Surgery

## 2022-07-12 ENCOUNTER — Other Ambulatory Visit: Payer: Self-pay

## 2022-07-12 ENCOUNTER — Observation Stay (HOSPITAL_COMMUNITY)
Admission: RE | Admit: 2022-07-12 | Discharge: 2022-07-13 | Disposition: A | Discharge status: Home Health | Payer: Medicare Other | Attending: Orthopedic Surgery | Admitting: Orthopedic Surgery

## 2022-07-12 DIAGNOSIS — Z7984 Long term (current) use of oral hypoglycemic drugs: Secondary | ICD-10-CM | POA: Diagnosis not present

## 2022-07-12 DIAGNOSIS — Z96651 Presence of right artificial knee joint: Secondary | ICD-10-CM | POA: Insufficient documentation

## 2022-07-12 DIAGNOSIS — Z79899 Other long term (current) drug therapy: Secondary | ICD-10-CM | POA: Insufficient documentation

## 2022-07-12 DIAGNOSIS — I10 Essential (primary) hypertension: Secondary | ICD-10-CM | POA: Diagnosis not present

## 2022-07-12 DIAGNOSIS — M1712 Unilateral primary osteoarthritis, left knee: Secondary | ICD-10-CM | POA: Diagnosis not present

## 2022-07-12 DIAGNOSIS — M21162 Varus deformity, not elsewhere classified, left knee: Secondary | ICD-10-CM | POA: Diagnosis not present

## 2022-07-12 DIAGNOSIS — E119 Type 2 diabetes mellitus without complications: Secondary | ICD-10-CM | POA: Diagnosis not present

## 2022-07-12 DIAGNOSIS — Z7982 Long term (current) use of aspirin: Secondary | ICD-10-CM | POA: Diagnosis not present

## 2022-07-12 DIAGNOSIS — Z85828 Personal history of other malignant neoplasm of skin: Secondary | ICD-10-CM | POA: Diagnosis not present

## 2022-07-12 DIAGNOSIS — Z87891 Personal history of nicotine dependence: Secondary | ICD-10-CM | POA: Insufficient documentation

## 2022-07-12 DIAGNOSIS — M179 Osteoarthritis of knee, unspecified: Secondary | ICD-10-CM

## 2022-07-12 HISTORY — PX: TOTAL KNEE ARTHROPLASTY: SHX125

## 2022-07-12 LAB — GLUCOSE, CAPILLARY
Glucose-Capillary: 155 mg/dL — ABNORMAL HIGH (ref 70–99)
Glucose-Capillary: 139 mg/dL — ABNORMAL HIGH (ref 70–99)
Glucose-Capillary: 175 mg/dL — ABNORMAL HIGH (ref 70–99)
Glucose-Capillary: 156 mg/dL — ABNORMAL HIGH (ref 70–99)

## 2022-07-12 SURGERY — ARTHROPLASTY, KNEE, TOTAL
Anesthesia: Spinal | Site: Knee | Laterality: Left

## 2022-07-12 MED ORDER — MENTHOL 3 MG MT LOZG: 1.0000 | LOZENGE | OROMUCOSAL | Status: DC | PRN

## 2022-07-12 MED ORDER — EMPAGLIFLOZIN 10 MG PO TABS
10.0000 mg | ORAL_TABLET | Freq: Every day | ORAL | Status: DC
  Administered 2022-07-13: 10 mg via ORAL
  Filled 2022-07-12: qty 1

## 2022-07-12 MED ORDER — METHOCARBAMOL 1000 MG/10ML IJ SOLN: 500.0000 mg | Freq: Four times a day (QID) | INTRAVENOUS | Status: DC | PRN

## 2022-07-12 MED ORDER — LIDOCAINE HCL (PF) 2 % IJ SOLN
INTRAMUSCULAR | Status: AC
  Filled 2022-07-12: qty 5

## 2022-07-12 MED ORDER — PROPOFOL 500 MG/50ML IV EMUL
INTRAVENOUS | Status: AC
  Filled 2022-07-12: qty 50

## 2022-07-12 MED ORDER — BUPIVACAINE-MELOXICAM ER 200-6 MG/7ML IJ SOLN
INTRAMUSCULAR | Status: DC | PRN
  Administered 2022-07-12: 400 mg

## 2022-07-12 MED ORDER — ORAL CARE MOUTH RINSE: 15.0000 mL | Freq: Once | OROMUCOSAL | Status: AC

## 2022-07-12 MED ORDER — FENTANYL CITRATE (PF) 100 MCG/2ML IJ SOLN
INTRAMUSCULAR | Status: AC
  Filled 2022-07-12: qty 2

## 2022-07-12 MED ORDER — VITAMIN C 500 MG PO TABS
500.0000 mg | ORAL_TABLET | Freq: Every day | ORAL | Status: DC
  Administered 2022-07-13: 500 mg via ORAL
  Filled 2022-07-12: qty 1

## 2022-07-12 MED ORDER — IBUPROFEN 400 MG PO TABS
400.0000 mg | ORAL_TABLET | Freq: Once | ORAL | Status: AC
  Administered 2022-07-12: 400 mg via ORAL
  Filled 2022-07-12: qty 1

## 2022-07-12 MED ORDER — FENTANYL CITRATE PF 50 MCG/ML IJ SOSY: 50.0000 ug | PREFILLED_SYRINGE | Freq: Once | INTRAMUSCULAR | Status: DC | PRN

## 2022-07-12 MED ORDER — PHENOL 1.4 % MT LIQD: 1.0000 | OROMUCOSAL | Status: DC | PRN

## 2022-07-12 MED ORDER — TRANEXAMIC ACID-NACL 1000-0.7 MG/100ML-% IV SOLN
1000.0000 mg | Freq: Once | INTRAVENOUS | Status: AC
  Administered 2022-07-12: 1000 mg via INTRAVENOUS
  Filled 2022-07-12: qty 100

## 2022-07-12 MED ORDER — INSULIN ASPART 100 UNIT/ML IJ SOLN
0.0000 [IU] | Freq: Three times a day (TID) | INTRAMUSCULAR | Status: DC
  Administered 2022-07-12: 2 [IU] via SUBCUTANEOUS
  Administered 2022-07-13 (×2): 1 [IU] via SUBCUTANEOUS

## 2022-07-12 MED ORDER — BUPIVACAINE IN DEXTROSE 0.75-8.25 % IT SOLN
INTRATHECAL | Status: DC | PRN
  Administered 2022-07-12: 1.8 mL via INTRATHECAL

## 2022-07-12 MED ORDER — PROPOFOL 10 MG/ML IV BOLUS
INTRAVENOUS | Status: DC | PRN
  Administered 2022-07-12 (×3): 20 mg via INTRAVENOUS

## 2022-07-12 MED ORDER — HYDROMORPHONE HCL 1 MG/ML IJ SOLN: 0.2500 mg | INTRAMUSCULAR | Status: DC | PRN

## 2022-07-12 MED ORDER — DEXAMETHASONE SODIUM PHOSPHATE 4 MG/ML IJ SOLN
INTRAMUSCULAR | Status: AC
  Filled 2022-07-12: qty 2

## 2022-07-12 MED ORDER — ROSUVASTATIN CALCIUM 20 MG PO TABS
10.0000 mg | ORAL_TABLET | ORAL | Status: DC
  Administered 2022-07-13: 10 mg via ORAL
  Filled 2022-07-12: qty 1

## 2022-07-12 MED ORDER — CELECOXIB 100 MG PO CAPS
200.0000 mg | ORAL_CAPSULE | Freq: Two times a day (BID) | ORAL | Status: DC
  Administered 2022-07-12 – 2022-07-13 (×3): 200 mg via ORAL
  Filled 2022-07-12 (×3): qty 2

## 2022-07-12 MED ORDER — MIDAZOLAM HCL 2 MG/2ML IJ SOLN
1.0000 mg | Freq: Once | INTRAMUSCULAR | Status: DC
  Filled 2022-07-12: qty 2

## 2022-07-12 MED ORDER — FENTANYL CITRATE (PF) 100 MCG/2ML IJ SOLN
INTRAMUSCULAR | Status: DC | PRN
  Administered 2022-07-12: 20 ug via INTRATHECAL

## 2022-07-12 MED ORDER — TRAMADOL HCL 50 MG PO TABS
50.0000 mg | ORAL_TABLET | Freq: Four times a day (QID) | ORAL | Status: DC
  Administered 2022-07-12 – 2022-07-13 (×4): 50 mg via ORAL
  Filled 2022-07-12 (×4): qty 1

## 2022-07-12 MED ORDER — PROPOFOL 500 MG/50ML IV EMUL
INTRAVENOUS | Status: DC | PRN
  Administered 2022-07-12: 50 ug/kg/min via INTRAVENOUS

## 2022-07-12 MED ORDER — FENTANYL CITRATE PF 50 MCG/ML IJ SOSY
PREFILLED_SYRINGE | INTRAMUSCULAR | Status: AC
  Filled 2022-07-12: qty 1

## 2022-07-12 MED ORDER — METOCLOPRAMIDE HCL 10 MG PO TABS: 5.0000 mg | ORAL_TABLET | Freq: Three times a day (TID) | ORAL | Status: DC | PRN

## 2022-07-12 MED ORDER — ASPIRIN 81 MG PO CHEW
81.0000 mg | CHEWABLE_TABLET | Freq: Two times a day (BID) | ORAL | Status: DC
  Administered 2022-07-12 – 2022-07-13 (×2): 81 mg via ORAL
  Filled 2022-07-12 (×2): qty 1

## 2022-07-12 MED ORDER — SODIUM CHLORIDE 0.9 % IV SOLN: INTRAVENOUS | Status: DC

## 2022-07-12 MED ORDER — ZINC SULFATE 220 (50 ZN) MG PO CAPS
220.0000 mg | ORAL_CAPSULE | Freq: Every day | ORAL | Status: DC
  Administered 2022-07-13: 220 mg via ORAL
  Filled 2022-07-12: qty 1

## 2022-07-12 MED ORDER — INSULIN ASPART 100 UNIT/ML IJ SOLN: 0.0000 [IU] | Freq: Every day | INTRAMUSCULAR | Status: DC

## 2022-07-12 MED ORDER — DEXAMETHASONE SODIUM PHOSPHATE 10 MG/ML IJ SOLN
INTRAMUSCULAR | Status: DC | PRN
  Administered 2022-07-12: 5 mg via INTRAVENOUS

## 2022-07-12 MED ORDER — HYDROCODONE-ACETAMINOPHEN 10-325 MG PO TABS
1.0000 | ORAL_TABLET | Freq: Once | ORAL | Status: AC
  Administered 2022-07-12: 1 via ORAL

## 2022-07-12 MED ORDER — 0.9 % SODIUM CHLORIDE (POUR BTL) OPTIME
TOPICAL | Status: DC | PRN
  Administered 2022-07-12: 1000 mL

## 2022-07-12 MED ORDER — PROPOFOL 10 MG/ML IV BOLUS
INTRAVENOUS | Status: AC
  Filled 2022-07-12: qty 20

## 2022-07-12 MED ORDER — LABETALOL HCL 200 MG PO TABS
300.0000 mg | ORAL_TABLET | Freq: Two times a day (BID) | ORAL | Status: DC
  Administered 2022-07-12 – 2022-07-13 (×2): 300 mg via ORAL
  Filled 2022-07-12 (×2): qty 2

## 2022-07-12 MED ORDER — LACTATED RINGERS IV SOLN: INTRAVENOUS | Status: DC

## 2022-07-12 MED ORDER — LOSARTAN POTASSIUM 50 MG PO TABS
100.0000 mg | ORAL_TABLET | Freq: Every day | ORAL | Status: DC
  Administered 2022-07-13: 100 mg via ORAL
  Filled 2022-07-12: qty 2

## 2022-07-12 MED ORDER — STERILE WATER FOR IRRIGATION IR SOLN
Status: DC | PRN
  Administered 2022-07-12: 1000 mL

## 2022-07-12 MED ORDER — FENTANYL CITRATE (PF) 100 MCG/2ML IJ SOLN
INTRAMUSCULAR | Status: DC | PRN
  Administered 2022-07-12 (×2): 25 ug via INTRAVENOUS
  Administered 2022-07-12: 30 ug via INTRAVENOUS

## 2022-07-12 MED ORDER — ONDANSETRON HCL 4 MG/2ML IJ SOLN
4.0000 mg | Freq: Once | INTRAMUSCULAR | Status: AC
  Administered 2022-07-12: 4 mg via INTRAVENOUS
  Filled 2022-07-12: qty 2

## 2022-07-12 MED ORDER — CEFAZOLIN SODIUM-DEXTROSE 2-4 GM/100ML-% IV SOLN
2.0000 g | Freq: Four times a day (QID) | INTRAVENOUS | Status: AC
  Administered 2022-07-12 (×2): 2 g via INTRAVENOUS
  Filled 2022-07-12 (×2): qty 100

## 2022-07-12 MED ORDER — METHOCARBAMOL 1000 MG/10ML IJ SOLN
500.0000 mg | Freq: Four times a day (QID) | INTRAVENOUS | Status: DC
  Administered 2022-07-12: 500 mg via INTRAVENOUS
  Filled 2022-07-12: qty 500

## 2022-07-12 MED ORDER — HYDROCHLOROTHIAZIDE 25 MG PO TABS
25.0000 mg | ORAL_TABLET | Freq: Every day | ORAL | Status: DC
  Administered 2022-07-13: 25 mg via ORAL
  Filled 2022-07-12: qty 1

## 2022-07-12 MED ORDER — PREGABALIN 50 MG PO CAPS
50.0000 mg | ORAL_CAPSULE | Freq: Three times a day (TID) | ORAL | Status: DC
  Administered 2022-07-12: 50 mg via ORAL
  Filled 2022-07-12: qty 1

## 2022-07-12 MED ORDER — ACETAMINOPHEN 500 MG PO TABS
500.0000 mg | ORAL_TABLET | Freq: Four times a day (QID) | ORAL | Status: AC
  Administered 2022-07-12 – 2022-07-13 (×4): 500 mg via ORAL
  Filled 2022-07-12 (×3): qty 1

## 2022-07-12 MED ORDER — POLYETHYLENE GLYCOL 3350 17 G PO PACK
17.0000 g | PACK | Freq: Every day | ORAL | Status: DC
  Administered 2022-07-13: 17 g via ORAL
  Filled 2022-07-12: qty 1

## 2022-07-12 MED ORDER — ONDANSETRON HCL 4 MG/2ML IJ SOLN: 4.0000 mg | Freq: Once | INTRAMUSCULAR | Status: DC | PRN

## 2022-07-12 MED ORDER — METHOCARBAMOL 500 MG PO TABS: 500.0000 mg | ORAL_TABLET | Freq: Four times a day (QID) | ORAL | Status: DC | PRN

## 2022-07-12 MED ORDER — LOSARTAN POTASSIUM-HCTZ 100-25 MG PO TABS: 1.0000 | ORAL_TABLET | Freq: Every day | ORAL | Status: DC

## 2022-07-12 MED ORDER — ONDANSETRON HCL 4 MG PO TABS: 4.0000 mg | ORAL_TABLET | Freq: Four times a day (QID) | ORAL | Status: DC | PRN

## 2022-07-12 MED ORDER — TRANEXAMIC ACID-NACL 1000-0.7 MG/100ML-% IV SOLN
1000.0000 mg | INTRAVENOUS | Status: AC
  Administered 2022-07-12: 1000 mg via INTRAVENOUS
  Filled 2022-07-12: qty 100

## 2022-07-12 MED ORDER — LIDOCAINE HCL (PF) 1 % IJ SOLN
INTRAMUSCULAR | Status: AC
  Filled 2022-07-12: qty 30

## 2022-07-12 MED ORDER — HYDROCODONE-ACETAMINOPHEN 7.5-325 MG PO TABS: 1.0000 | ORAL_TABLET | ORAL | Status: DC | PRN

## 2022-07-12 MED ORDER — VITAMIN D 25 MCG (1000 UNIT) PO TABS
1000.0000 [IU] | ORAL_TABLET | Freq: Every day | ORAL | Status: DC
  Administered 2022-07-13: 1000 [IU] via ORAL
  Filled 2022-07-12: qty 1

## 2022-07-12 MED ORDER — AMLODIPINE BESYLATE 5 MG PO TABS
10.0000 mg | ORAL_TABLET | Freq: Every day | ORAL | Status: DC
  Administered 2022-07-13: 10 mg via ORAL
  Filled 2022-07-12: qty 2

## 2022-07-12 MED ORDER — CEFAZOLIN SODIUM-DEXTROSE 2-4 GM/100ML-% IV SOLN
2.0000 g | INTRAVENOUS | Status: AC
  Administered 2022-07-12: 2 g via INTRAVENOUS
  Filled 2022-07-12: qty 100

## 2022-07-12 MED ORDER — DOCUSATE SODIUM 100 MG PO CAPS
100.0000 mg | ORAL_CAPSULE | Freq: Two times a day (BID) | ORAL | Status: DC
  Administered 2022-07-12 – 2022-07-13 (×2): 100 mg via ORAL
  Filled 2022-07-12 (×2): qty 1

## 2022-07-12 MED ORDER — PANTOPRAZOLE SODIUM 40 MG PO TBEC
40.0000 mg | DELAYED_RELEASE_TABLET | Freq: Every day | ORAL | Status: DC
  Administered 2022-07-12 – 2022-07-13 (×2): 40 mg via ORAL
  Filled 2022-07-12 (×2): qty 1

## 2022-07-12 MED ORDER — MORPHINE SULFATE (PF) 2 MG/ML IV SOLN: 0.5000 mg | INTRAVENOUS | Status: DC | PRN

## 2022-07-12 MED ORDER — ONDANSETRON HCL 4 MG/2ML IJ SOLN: 4.0000 mg | Freq: Four times a day (QID) | INTRAMUSCULAR | Status: DC | PRN

## 2022-07-12 MED ORDER — ONDANSETRON HCL 4 MG/2ML IJ SOLN
INTRAMUSCULAR | Status: DC | PRN
  Administered 2022-07-12: 4 mg via INTRAVENOUS

## 2022-07-12 MED ORDER — BUPIVACAINE-MELOXICAM ER 200-6 MG/7ML IJ SOLN
INTRAMUSCULAR | Status: AC
  Filled 2022-07-12: qty 2

## 2022-07-12 MED ORDER — SODIUM CHLORIDE 0.9 % IR SOLN
Status: DC | PRN
  Administered 2022-07-12: 3000 mL

## 2022-07-12 MED ORDER — ROPIVACAINE HCL 5 MG/ML IJ SOLN
INTRAMUSCULAR | Status: AC
  Filled 2022-07-12: qty 30

## 2022-07-12 MED ORDER — ALUM & MAG HYDROXIDE-SIMETH 200-200-20 MG/5ML PO SUSP
30.0000 mL | ORAL | Status: DC | PRN
  Administered 2022-07-12: 30 mL via ORAL
  Filled 2022-07-12: qty 30

## 2022-07-12 MED ORDER — METOCLOPRAMIDE HCL 5 MG/ML IJ SOLN: 5.0000 mg | Freq: Three times a day (TID) | INTRAMUSCULAR | Status: DC | PRN

## 2022-07-12 MED ORDER — METFORMIN HCL 500 MG PO TABS
500.0000 mg | ORAL_TABLET | Freq: Two times a day (BID) | ORAL | Status: DC
  Administered 2022-07-12 – 2022-07-13 (×2): 500 mg via ORAL
  Filled 2022-07-12 (×2): qty 1

## 2022-07-12 MED ORDER — ONDANSETRON HCL 4 MG/2ML IJ SOLN
INTRAMUSCULAR | Status: AC
  Filled 2022-07-12: qty 2

## 2022-07-12 MED ORDER — ACETAMINOPHEN 325 MG PO TABS: 325.0000 mg | ORAL_TABLET | Freq: Four times a day (QID) | ORAL | Status: DC | PRN

## 2022-07-12 MED ORDER — CHLORHEXIDINE GLUCONATE 0.12 % MT SOLN
15.0000 mL | Freq: Once | OROMUCOSAL | Status: AC
  Administered 2022-07-12: 15 mL via OROMUCOSAL

## 2022-07-12 SURGICAL SUPPLY — 67 items
ATTUNE MED DOME PAT 38 KNEE (Knees) IMPLANT
ATTUNE PS FEM LT SZ 8 CEM KNEE (Femur) IMPLANT
AUG TIB 7/8 10 REV CMNT LM/RL (Joint) ×1 IMPLANT
AUG TIB ATTUNE LM/RL SZ7/8X10 (Joint) ×1 IMPLANT
AUGMENT TIB ATT LM/RL SZ7/8X10 (Joint) IMPLANT
BANDAGE ESMARK 6X9 LF (GAUZE/BANDAGES/DRESSINGS) ×2 IMPLANT
BASEPLATE TIB CMT CRS FB SZ 7 (Knees) IMPLANT
BLADE SAGITTAL 25.0X1.27X90 (BLADE) ×2 IMPLANT
BLADE SAW RECIP 87.9 MT (BLADE) IMPLANT
BLADE SAW SGTL 11.0X1.19X90.0M (BLADE) ×2 IMPLANT
BLADE SURG SZ10 CARB STEEL (BLADE) IMPLANT
BNDG CMPR 9X6 STRL LF SNTH (GAUZE/BANDAGES/DRESSINGS) ×1
BNDG ESMARK 6X9 LF (GAUZE/BANDAGES/DRESSINGS) ×1
BSPLAT TIB 7 CMNT REV FX BRNG (Knees) ×1 IMPLANT
CEMENT HV SMART SET (Cement) ×4 IMPLANT
CLOTH BEACON ORANGE TIMEOUT ST (SAFETY) ×2 IMPLANT
COOLER ICEMAN CLASSIC (MISCELLANEOUS) ×2 IMPLANT
COVER LIGHT HANDLE STERIS (MISCELLANEOUS) ×4 IMPLANT
CUFF TOURN SGL QUICK 34 (TOURNIQUET CUFF) ×1
CUFF TRNQT CYL 34X4.125X (TOURNIQUET CUFF) ×2 IMPLANT
DRAPE BACK TABLE (DRAPES) ×2 IMPLANT
DRAPE EXTREMITY T 121X128X90 (DISPOSABLE) ×2 IMPLANT
DRESSING AQUACEL AG ADV 3.5X12 (MISCELLANEOUS) ×2 IMPLANT
DRSG AQUACEL AG ADV 3.5X12 (MISCELLANEOUS) ×1
DURAPREP 26ML APPLICATOR (WOUND CARE) ×4 IMPLANT
ELECT REM PT RETURN 9FT ADLT (ELECTROSURGICAL) ×1
ELECTRODE REM PT RTRN 9FT ADLT (ELECTROSURGICAL) ×2 IMPLANT
GLOVE BIO SURGEON STRL SZ7 (GLOVE) IMPLANT
GLOVE BIOGEL PI IND STRL 6.5 (GLOVE) IMPLANT
GLOVE BIOGEL PI IND STRL 7.0 (GLOVE) ×6 IMPLANT
GLOVE BIOGEL PI IND STRL 7.5 (GLOVE) IMPLANT
GLOVE BIOGEL PI IND STRL 8.5 (GLOVE) ×2 IMPLANT
GLOVE ECLIPSE 6.5 STRL STRAW (GLOVE) IMPLANT
GLOVE SKINSENSE STRL SZ8.0 LF (GLOVE) ×2 IMPLANT
GLOVE SURG SS PI 6.5 STRL IVOR (GLOVE) IMPLANT
GOWN STRL REUS W/TWL LRG LVL3 (GOWN DISPOSABLE) ×6 IMPLANT
GOWN STRL REUS W/TWL XL LVL3 (GOWN DISPOSABLE) ×2 IMPLANT
HANDPIECE INTERPULSE COAX TIP (DISPOSABLE) ×1
HOOD W/PEELAWAY (MISCELLANEOUS) ×8 IMPLANT
INSERT TIBIA FIXED BEARING SZ8 (Insert) IMPLANT
INST SET MAJOR BONE (KITS) ×2 IMPLANT
IV NS IRRIG 3000ML ARTHROMATIC (IV SOLUTION) ×2 IMPLANT
KIT BLADEGUARD II DBL (SET/KITS/TRAYS/PACK) ×2 IMPLANT
KIT TURNOVER KIT A (KITS) ×2 IMPLANT
MANIFOLD NEPTUNE II (INSTRUMENTS) ×2 IMPLANT
MARKER SKIN DUAL TIP RULER LAB (MISCELLANEOUS) ×2 IMPLANT
NS IRRIG 1000ML POUR BTL (IV SOLUTION) ×2 IMPLANT
PACK TOTAL JOINT (CUSTOM PROCEDURE TRAY) ×2 IMPLANT
PAD ARMBOARD 7.5X6 YLW CONV (MISCELLANEOUS) ×2 IMPLANT
PAD COLD SHLDR SM WRAP-ON (PAD) ×2 IMPLANT
PILLOW KNEE EXTENSION 0 DEG (MISCELLANEOUS) ×2 IMPLANT
PIN/DRILL PACK ORTHO 1/8X3.0 (PIN) IMPLANT
SAW OSC TIP CART 19.5X105X1.3 (SAW) ×2 IMPLANT
SET BASIN LINEN APH (SET/KITS/TRAYS/PACK) ×2 IMPLANT
SET HNDPC FAN SPRY TIP SCT (DISPOSABLE) ×2 IMPLANT
SOLUTION PRONTOSAN WOUND 350ML (IRRIGATION / IRRIGATOR) ×2 IMPLANT
STAPLER VISISTAT 35W (STAPLE) ×2 IMPLANT
SUT BRALON NAB BRD #1 30IN (SUTURE) ×2 IMPLANT
SUT MNCRL 0 VIOLET CTX 36 (SUTURE) ×2 IMPLANT
SUT MON AB 0 CT1 (SUTURE) ×2 IMPLANT
SUT MONOCRYL 0 CTX 36 (SUTURE) ×1
SYR BULB IRRIG 60ML STRL (SYRINGE) ×2 IMPLANT
TOWEL OR 17X26 4PK STRL BLUE (TOWEL DISPOSABLE) ×2 IMPLANT
TOWER CARTRIDGE SMART MIX (DISPOSABLE) ×2 IMPLANT
TRAY FOLEY MTR SLVR 16FR STAT (SET/KITS/TRAYS/PACK) ×2 IMPLANT
WATER STERILE IRR 1000ML POUR (IV SOLUTION) ×4 IMPLANT
YANKAUER SUCT 12FT TUBE ARGYLE (SUCTIONS) ×2 IMPLANT

## 2022-07-12 NOTE — Plan of Care (Signed)
  Problem: Acute Rehab PT Goals(only PT should resolve) Goal: Pt Will Go Supine/Side To Sit Outcome: Progressing Flowsheets (Taken 07/12/2022 1531) Pt will go Supine/Side to Sit:  Independently  with modified independence Goal: Patient Will Transfer Sit To/From Stand Outcome: Progressing Flowsheets (Taken 07/12/2022 1531) Patient will transfer sit to/from stand: with modified independence Goal: Pt Will Transfer Bed To Chair/Chair To Bed Outcome: Progressing Flowsheets (Taken 07/12/2022 1531) Pt will Transfer Bed to Chair/Chair to Bed: with modified independence Goal: Pt Will Ambulate Outcome: Progressing Flowsheets (Taken 07/12/2022 1531) Pt will Ambulate:  > 125 feet  with modified independence  with rolling walker   3:31 PM, 07/12/22 Lonell Grandchild, MPT Physical Therapist with [facility] [phone] office [phone] mobile phone

## 2022-07-12 NOTE — Evaluation (Signed)
Physical Therapy Evaluation Patient Details Name: Cody Palmer MRN: [id number] DOB: 31-Mar-1933 Today's Date: 07/12/2022   LEFT KNEE ROM: 0 - 100 degrees AMBULATION DISTANCE: 120 feet using RW with Supervision/Modified Independence   History of Present Illness  Cody Palmer is a 89 y/o male, s/p Left TKA on 07/12/22, with the diagnosis of left total knee arthroplasty.  Clinical Impression  Patient instructed in and given written instructions for HEP with good return demonstrated, good tolerance for ambulating in room/hallway with only mild pain left knee and tolerated sitting up in chair with LLE dangling after therapy.  Patient will benefit from continued skilled physical therapy in hospital and recommended venue below to increase strength, balance, endurance for safe ADLs and gait.        Recommendations for follow up therapy are one component of a multi-disciplinary discharge planning process, led by the attending physician.  Recommendations may be updated based on patient status, additional functional criteria and insurance authorization.  Follow Up Recommendations Home health PT      Assistance Recommended at Discharge Set up Supervision/Assistance  Patient can return home with the following  A little help with walking and/or transfers;A little help with bathing/dressing/bathroom;Help with stairs or ramp for entrance;Assistance with cooking/housework    Equipment Recommendations None recommended by PT  Recommendations for Other Services       Functional Status Assessment Patient has had a recent decline in their functional status and demonstrates the ability to make significant improvements in function in a reasonable and predictable amount of time.     Precautions / Restrictions Precautions Precautions: Fall Restrictions Weight Bearing Restrictions: Yes LLE Weight Bearing: Weight bearing as tolerated      Mobility  Bed Mobility Overal bed mobility: Modified  Independent                  Transfers Overall transfer level: Needs assistance Equipment used: Rolling walker (2 wheels) Transfers: Sit to/from Stand, Bed to chair/wheelchair/BSC Sit to Stand: Min guard, Supervision   Step pivot transfers: Supervision       General transfer comment: required verbal cues for proper hand placement during sit to stands with fair/good carryover demonstrated    Ambulation/Gait Ambulation/Gait assistance: Supervision, Modified independent (Device/Increase time) Gait Distance (Feet): 120 Feet Assistive device: Rolling walker (2 wheels) Gait Pattern/deviations: Step-to pattern, Decreased step length - right, Decreased step length - left, Decreased stride length, Antalgic Gait velocity: decreased     General Gait Details: slightly labored cadence with fair return for left heel to toe stepping without loss of balance, limited mostly due to fatigue  Stairs            Wheelchair Mobility    Modified Rankin (Stroke Patients Only)       Balance Overall balance assessment: Needs assistance Sitting-balance support: Feet supported, No upper extremity supported Sitting balance-Leahy Scale: Good Sitting balance - Comments: seated at EOB   Standing balance support: During functional activity, Bilateral upper extremity supported Standing balance-Leahy Scale: Fair Standing balance comment: fair/good using RW                             Pertinent Vitals/Pain Pain Assessment Pain Assessment: Faces Faces Pain Scale: Hurts a little bit Pain Location: left knee Pain Descriptors / Indicators: Sore Pain Intervention(s): Limited activity within patient's tolerance, Monitored during session, Repositioned    Home Living Family/patient expects to be discharged to:: Private residence Living Arrangements: Alone  Available Help at Discharge: Family;Available 24 hours/day Type of Home: House Home Access: Stairs to enter Entrance  Stairs-Rails: Right;Left;Can reach both Entrance Stairs-Number of Steps: 3   Home Layout: One level Home Equipment: Conservation officer, nature (2 wheels);Cane - single point;Grab bars - tub/shower      Prior Function Prior Level of Function : Independent/Modified Independent             Mobility Comments: Community ambulator without AD, drives, shops ADLs Comments: Independent     Hand Dominance   Dominant Hand: Right    Extremity/Trunk Assessment   Upper Extremity Assessment Upper Extremity Assessment: Overall WFL for tasks assessed    Lower Extremity Assessment Lower Extremity Assessment: Overall WFL for tasks assessed;LLE deficits/detail LLE Deficits / Details: grossly 4/5 LLE: Unable to fully assess due to pain LLE Sensation: WNL LLE Coordination: WNL    Cervical / Trunk Assessment Cervical / Trunk Assessment: Normal  Communication   Communication: No difficulties  Cognition Arousal/Alertness: Awake/alert Behavior During Therapy: WFL for tasks assessed/performed Overall Cognitive Status: Within Functional Limits for tasks assessed                                          General Comments      Exercises Total Joint Exercises Ankle Circles/Pumps: Supine, 10 reps, Left, Strengthening, AROM Quad Sets: AROM, Strengthening, Left, 10 reps, Supine Short Arc Quad: AROM, Strengthening, Left, 10 reps, Supine Heel Slides: AROM, Strengthening, Left, 10 reps, Supine Goniometric ROM: left knee: 0 - 100 degrees   Assessment/Plan    PT Assessment Patient needs continued PT services  PT Problem List Decreased strength;Decreased activity tolerance;Decreased balance;Decreased mobility;Decreased range of motion       PT Treatment Interventions DME instruction;Gait training;Stair training;Functional mobility training;Therapeutic activities;Therapeutic exercise;Patient/family education;Balance training    PT Goals (Current goals can be found in the Care Plan  section)  Acute Rehab PT Goals Patient Stated Goal: return home with family to assist PT Goal Formulation: With patient Time For Goal Achievement: 07/13/22 Potential to Achieve Goals: Good    Frequency BID     Co-evaluation               AM-PAC PT "6 Clicks" Mobility  Outcome Measure Help needed turning from your back to your side while in a flat bed without using bedrails?: None Help needed moving from lying on your back to sitting on the side of a flat bed without using bedrails?: None Help needed moving to and from a bed to a chair (including a wheelchair)?: A Little Help needed standing up from a chair using your arms (e.g., wheelchair or bedside chair)?: A Little Help needed to walk in hospital room?: A Little Help needed climbing 3-5 steps with a railing? : A Little 6 Click Score: 20    End of Session   Activity Tolerance: Patient tolerated treatment well;Patient limited by fatigue Patient left: in chair;with call bell/phone within reach Nurse Communication: Mobility status PT Visit Diagnosis: Unsteadiness on feet (R26.81);Other abnormalities of gait and mobility (R26.89);Muscle weakness (generalized) (M62.81)    Time: [id number] PT Time Calculation (min) (ACUTE ONLY): 32 min   Charges:   PT Evaluation $PT Eval Moderate Complexity: 1 Mod PT Treatments $Therapeutic Activity: 23-37 mins        3:29 PM, 07/12/22 Lonell Grandchild, MPT Physical Therapist with [facility] [phone] office [phone] mobile phone

## 2022-07-12 NOTE — Anesthesia Procedure Notes (Addendum)
Spinal  Patient location during procedure: OR Start time: 07/12/2022 7:30 AM End time: 07/12/2022 7:35 AM Reason for block: surgical anesthesia Staffing Performed: resident/CRNA  Resident/CRNA: Vista Deck, CRNA Performed by: Vista Deck, CRNA Authorized by: Denese Killings, MD   Preanesthetic Checklist Completed: patient identified, IV checked, site marked, risks and benefits discussed, surgical consent, monitors and equipment checked, pre-op evaluation and timeout performed Spinal Block Patient position: sitting Prep: Betadine Patient monitoring: heart rate, cardiac monitor, continuous pulse ox and blood pressure Approach: midline Location: L3-4 Injection technique: single-shot Needle Needle type: Pencan  Needle gauge: 24 G Needle length: 10 cm Assessment Sensory level: T6 Events: CSF return Additional Notes CLEAR CSF PRE MID AND POST INJECTION  TRAY # [id number] # [id number] [id number] 07-13-2024

## 2022-07-12 NOTE — Anesthesia Procedure Notes (Signed)
Date/Time: 07/12/2022 7:45 AM  Performed by: Vista Deck, CRNAPre-anesthesia Checklist: Patient identified, Emergency Drugs available, Suction available, Timeout performed and Patient being monitored Patient Re-evaluated:Patient Re-evaluated prior to induction Oxygen Delivery Method: Non-rebreather mask

## 2022-07-12 NOTE — Op Note (Addendum)
Orthopaedic Surgery Operative Note (CSN: [id number])  Cody Palmer  1933/02/04 Date of Surgery: 07/12/2022   Diagnoses:  osteoarthritis left knee with fixed varus deformity which was moderate  Procedure: Left total knee arthroplasty   Operative Finding Successful completion of the planned procedure.  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 Item Serial No. Manufacturer Lot No. LRB No. Used Action  CEMENT HV SMART SET - [id number] Cement CEMENT HV SMART SET  [employer] [id number] Left 1 Implanted  CEMENT HV SMART SET - [id number] Cement CEMENT HV SMART SET  [employer]PU[employer]TH[employer] [id number] Left 1 Implanted  ATTUNE KNEE SYSTEM REVISION TIBIAL AUGMENT LM/RL CEMENTED 44m [employer]TH[employer]   DEPU[employer] ORTH[employer] [id number] Left 1 Implanted  ATTUNE KNEE SYSTEM REVISION TIBIAL BASE FIXED BEARING SIZE 7 CEMENTED [employer]   [employer]PU[employer] ORTH[employer] [id number] 1 Implanted  ATTUNE PS FEM LT SZ 8 CEM KNEE - [id number] ATTUNE PS FEM LT SZ 8 CEM KNEE  DEPU[employer] ORTH[employer] [id number] 1 Implanted  ATTUNE MED DOME PAT 38 KNEE - [id number] ATTUNE MED DOME PAT 38 KNEE  DEPU[employer] ORTH[employer] [id number] 1 Implanted  INSERT TIBIA FIXED BEARING SZ8 -  [id number] INSERT TIBIA FIXED BEARING SZ8  DEPU[employer] ORTHOPA[employer] [id number] 1 Implanted    Indications for Surgery:   Cody SKRAMSTADis a 89y.o. male who severe arthritis left knee with main complaining of knee giving way and loss of functional activities and ability to golf which she loves.  Benefits and risks of operative and nonoperative management were discussed prior to surgery with patient/guardian(s) and informed consent form was completed.  Specific risks including infection, need for additional surgery, continued pain and stiffness infection amputation DVT pulmonary embolus   Dictation for total knee replacement  07/12/2022  10:28 AM     Details of surgery: The patient was identified by 2 approved identification mechanisms. The operative extremity was evaluated and found to be acceptable for surgical treatment today. The chart was reviewed. The surgical site was confirmed and marked. The patient had a saphenous nerve block no block was given  The patient was taken to the operating room and given appropriate antibiotic Ancef 2 g. This is consistent with the SCIP protocol.  The patient was given the following anesthetic: Spinal  The patient was then placed supine on the operating table. A Foley catheter was inserted. The operative extremity was prepped and draped sterilely from the toes to the groin.  Timeout was executed confirming the patient's name, surgical site, antibiotic administration, x-rays available, and implants available.  The operative limb,  was exsanguinated with a six-inch Esmarch and the tourniquet was inflated to 285 mmHg.  A straight midline incision was made over the left KNEE and taken down to the extensor mechanism. A medial arthrotomy was performed.  The patella was everted and the patellofemoral soft tissue was released, along with the patellar fat pad.  The anterior cruciate ligament and PCL were resected.  The anterior horns of the lateral and  medial meniscus were resected. The medial soft tissue sleeve was elevated to the mid coronal plane.  A three-eighths inch drill bit was used to enter the femoral canal which was decompressed with suction and irrigation until clear.   The distal femoral cutting guide was set for 5 degree left 10 mm distal resection, The distal femur was resected and checked for flatness.  The Depuy attune sizing femoral guide was placed and the femur was preliminarily sized to a size 7-1/2.   The external alignment guide for the tibial resection was then applied to the distal and proximal tibia and set for anatomic slope along with 9 MM resection  from the higher lateral side.   Rotational alignment was set using the malleolus, the tibial tubercle and the tibial spines.  The proximal tibia was resected along with  residual menisci. The tibia was sized using a base plate to a size 7.   The extension gap was checked with a 5 block the extension gap was a little bit tight so I took an additional 2 mm from the femur distally.  The 5 block then fit nicely with good stability in extension correction of the varus deformity was obtained with a rectangular extension gap  I sized the femur again and since it was between a 7 and 8 I put the pins in for 7 but used an 8 cutting block.   A 4-in-1 cutting block was placed along with collateral ligament retractors.  Our target was proper rotation based on the epicondylar axis using Whitesides line as a guide as well.  Once the block was pinned in place we took the spacer block that balanced extension gap and placed on top of the tibia under the femoral block.  This fit well with a 5 spacer block  Once I was satisfied with the spacer block collateral ligament retractors were placed and I completed the 4 distal femoral cuts   The extension gap was rechecked with the same spacer block which was a size 5  At this point I decided that the tibia needed a augment I tried a 5  and 10 and decided that the 10 would be best  We we placed the external alignment guide per manufacture technique and made a 10 mm horizontal cut and then a sagittal cut with a reciprocating saw.  We put the trial in place with a 7 trial tibia and a 10 augment.  This left the lateral side with 1 to 2 mm gap so I recut the medial side and this allowed the tibial tray with the augment to sit flat.  Proximal tibial preparation was completed   The correct sized notch cutting guide for the femur was then applied and the notch cut was made.  Trial reduction was completed using size 8Femur , size 7  tibial tray with a 10 mm medial augment and 5 poly trial implant. Patella tracking was normal  We then skeletonized the patella. It measured 25 in thickness and the patellar resection was set for 15 millimeters. the patellar resection was completed. The patella diameter measured 38. We then drilled the peg holes for the patella.  Completed patellar thickness was 25  Passive flexion 110 degrees with full extension.  Thorough irrigation was performed using saline  and the  bone was dried and prepared for cement. The cement was mixed on the back table using third generation preparation techniques  The implants were then cemented in place and excess cement was removed. The cement was allowed to cure.   Excess bone fragments and cement was removed.  The extensor mechanism was closed with #1 Bralon suture followed by subcutaneous tissue closure using 0 Monocryl suture in 2 layers  Zynrelef a total of 2 vials were injected prior to complete extensor mechanism closure.  The openings and the capsule were then closed with #1 Braylon   Skin approximation was performed using staples  A sterile dressing was applied, TED hose were placed on the operative extremity followed by Cryo/Cuff.  The patient was taken recovery room in stable condition  Postop plan: Weightbearing as tolerated CPM machine Immediate  physical therapy Discharge tomorrow if stable

## 2022-07-12 NOTE — Transfer of Care (Signed)
Immediate Anesthesia Transfer of Care Note  Patient: Cody Palmer  Procedure(s) Performed: TOTAL KNEE ARTHROPLASTY (Left: Knee)  Patient Location: PACU  Anesthesia Type:Spinal  Level of Consciousness: awake, alert , and patient cooperative  Airway & Oxygen Therapy: Patient Spontanous Breathing  Post-op Assessment: Report given to RN, Post -op Vital signs reviewed and stable, and Patient moving all extremities X 4  Post vital signs: Reviewed and stable  Last Vitals:  Vitals Value Taken Time  BP 118/66 07/12/22 1024  Temp 36.7 C 07/12/22 1024  Pulse 64 07/12/22 1026  Resp 17 07/12/22 1026  SpO2 96 % 07/12/22 1026  Vitals shown include unvalidated device data.  Last Pain:  Vitals:   07/12/22 0643  TempSrc: Oral  PainSc: 0-No pain         Complications: No notable events documented.

## 2022-07-12 NOTE — Progress Notes (Signed)
Pt arrived to room 332 via bed from PACU. A&O, denies any c/o pain at this time. DDI to left knee surgical site, ted hose on and iceman pack intact. IV site clean and flushes easily. Pt states that he ate lunch while in PACU, denies any c/o n/v. Pt and wife and oriented to room and safety procedures, state understanding. Bed in low position, call bell within reach, bed alarm on for safety. Advised to call for needs.

## 2022-07-12 NOTE — Anesthesia Preprocedure Evaluation (Addendum)
Anesthesia Evaluation  Patient identified by MRN, date of birth, ID band Patient awake    Reviewed: Allergy & Precautions, H&P , NPO status , Patient's Chart, lab work & pertinent test results, reviewed documented beta blocker date and time   Airway Mallampati: II  TM Distance: >3 FB Neck ROM: Full    Dental  (+) Dental Advisory Given, Partial Lower, Partial Upper   Pulmonary former smoker   Pulmonary exam normal breath sounds clear to auscultation       Cardiovascular hypertension, Pt. on medications and Pt. on home beta blockers Normal cardiovascular exam Rhythm:Regular Rate:Normal  07-Jul-2022 12:57:29 [facility] ROUTINE RECORD 08-03-1932 (56 yr) Male Caucasian Room: Loc:905 Technician: RSM Test ind: Vent. rate 68 BPM PR interval 208 ms QRS duration 102 ms QT/QTcB 416/442 ms P-R-T axes 24 -22 35 Normal sinus rhythm Normal ECG When compared with ECG of 19-Apr-2016 14:58, No significant change since last tracing PREVIOUS ECG IS PRESENT Confirmed by Asencion Noble [phone]) on 07/07/2022 9:01:36 PM   Neuro/Psych negative neurological ROS  negative psych ROS   GI/Hepatic negative GI ROS, Neg liver ROS,,,  Endo/Other  diabetes, Well Controlled, Type 2, Oral Hypoglycemic Agents    Renal/GU negative Renal ROS  negative genitourinary   Musculoskeletal  (+) Arthritis , Osteoarthritis,    Abdominal   Peds negative pediatric ROS (+)  Hematology negative hematology ROS (+)   Anesthesia Other Findings   Reproductive/Obstetrics negative OB ROS                             Anesthesia Physical Anesthesia Plan  ASA: 2  Anesthesia Plan: Spinal   Post-op Pain Management: Dilaudid IV   Induction:   PONV Risk Score and Plan: 2 and Ondansetron and Dexamethasone  Airway Management Planned: Nasal Cannula and Natural Airway  Additional Equipment:   Intra-op Plan:    Post-operative Plan:   Informed Consent: I have reviewed the patients History and Physical, chart, labs and discussed the procedure including the risks, benefits and alternatives for the proposed anesthesia with the patient or authorized representative who has indicated his/her understanding and acceptance.     Dental advisory given  Plan Discussed with: CRNA and Surgeon  Anesthesia Plan Comments: (Dr. Aline Brochure doesn't want regional block for this patient.)        Anesthesia Quick Evaluation

## 2022-07-12 NOTE — Progress Notes (Unsigned)
Progress note to address the radiographic reading of new fracture distal femur after left total knee  The area in question is the known area of the sawblade exiting the femur.  This caused slight notching as the cutting guide was moved down 1.5 mm  It is controversial in the literature whether or not this increases the risk of femur fracture  This does not change the postoperative course although a fall onto a flexed knee with a notched femur could theoretically increase the risk of fracture

## 2022-07-12 NOTE — Brief Op Note (Signed)
07/12/2022  10:23 AM  PATIENT:  Cody Palmer  89 y.o. male  PRE-OPERATIVE DIAGNOSIS:  osteoarthritis left knee  POST-OPERATIVE DIAGNOSIS:  osteoarthritis left knee  PROCEDURE:  Procedure(s): TOTAL KNEE ARTHROPLASTY (Left)  Fixed varus deformity greater than 10 degrees but less than 20 degrees, moderate Stenotic notch PCL was intact I did not see an ACL or medial meniscus lateral meniscus was intact  All joints had osteophytes.  The medial side had a large defect and the bone looked like marble there were grooves in the bone.  The lateral side had chondromalacia grade 1 there was a large anterior osteophyte on the tibial surface at the margin of the joint  Posterior osteophytes also noted  Implants attune fixed-bearing posterior stabilized total knee with a 10 mm medial augment  Size 8 femur size 7 tibia size 38 patella  SURGEON:  Surgeon(s) and Role:    Carole Civil, MD - Primary  PHYSICIAN ASSISTANT:   ASSISTANTS: Nikki Cox  ANESTHESIA:   spinal  EBL:  25 mL   BLOOD ADMINISTERED:none  DRAINS: none   LOCAL MEDICATIONS USED:  OTHER zinrelef  SPECIMEN:  No Specimen  DISPOSITION OF SPECIMEN:  N/A  COUNTS:  YES  TOURNIQUET:   Total Tourniquet Time Documented: Thigh (Left) - 129 minutes Total: Thigh (Left) - 129 minutes   DICTATION: .Viviann Spare Dictation  PLAN OF CARE: Admit for overnight observation  PATIENT DISPOSITION:  PACU - hemodynamically stable.   Delay start of Pharmacological VTE agent (>24hrs) due to surgical blood loss or risk of bleeding: yes

## 2022-07-12 NOTE — Anesthesia Procedure Notes (Signed)
Date/Time: 07/12/2022 7:24 AM  Performed by: Vista Deck, CRNAPre-anesthesia Checklist: Patient identified, Emergency Drugs available, Suction available, Timeout performed and Patient being monitored Patient Re-evaluated:Patient Re-evaluated prior to induction Oxygen Delivery Method: Nasal Cannula

## 2022-07-12 NOTE — Interval H&P Note (Signed)
History and Physical Interval Note:  07/12/2022 7:14 AM  Cody Palmer  has presented today for surgery, with the diagnosis of left total knee arthroplasty.  The various methods of treatment have been discussed with the patient and family. After consideration of risks, benefits and other options for treatment, the patient has consented to  Procedure(s): TOTAL KNEE ARTHROPLASTY (Left) as a surgical intervention.  The patient's history has been reviewed, patient examined, no change in status, stable for surgery.  I have reviewed the patient's chart and labs.  Questions were answered to the patient's satisfaction.     [name]

## 2022-07-12 NOTE — Anesthesia Postprocedure Evaluation (Signed)
Anesthesia Post Note  Patient: [name]  Procedure(s) Performed: TOTAL KNEE ARTHROPLASTY (Left: Knee)  Patient location during evaluation: Phase II Anesthesia Type: Spinal Level of consciousness: awake and alert and oriented Pain management: pain level controlled Vital Signs Assessment: post-procedure vital signs reviewed and stable Respiratory status: spontaneous breathing, nonlabored ventilation and respiratory function stable Cardiovascular status: blood pressure returned to baseline and stable Postop Assessment: no apparent nausea or vomiting Anesthetic complications: no  No notable events documented.   Last Vitals:  Vitals:   07/12/22 1053 07/12/22 1100  BP:  130/74  Pulse: 75 77  Resp: 15 18  Temp:    SpO2: 97% 98%    Last Pain:  Vitals:   07/12/22 1130  TempSrc:   PainSc: 2                  Juluis Fitzsimmons C Trusten Hume

## 2022-07-13 ENCOUNTER — Other Ambulatory Visit: Payer: Self-pay | Admitting: Orthopedic Surgery

## 2022-07-13 DIAGNOSIS — M1712 Unilateral primary osteoarthritis, left knee: Secondary | ICD-10-CM | POA: Diagnosis not present

## 2022-07-13 LAB — TYPE AND SCREEN
ABO/RH(D): O POS
Antibody Screen: NEGATIVE
Unit division: 0
Unit division: 0

## 2022-07-13 LAB — BPAM RBC
Blood Product Expiration Date: 202403272359
Blood Product Expiration Date: 202403282359
ISSUE DATE / TIME: 202402271937
Unit Type and Rh: 5100
Unit Type and Rh: 5100

## 2022-07-13 LAB — GLUCOSE, CAPILLARY
Glucose-Capillary: 137 mg/dL — ABNORMAL HIGH (ref 70–99)
Glucose-Capillary: 124 mg/dL — ABNORMAL HIGH (ref 70–99)

## 2022-07-13 LAB — CBC
HCT: 32 % — ABNORMAL LOW (ref 39.0–52.0)
Hemoglobin: 10.4 g/dL — ABNORMAL LOW (ref 13.0–17.0)
MCH: 28 pg (ref 26.0–34.0)
MCHC: 32.5 g/dL (ref 30.0–36.0)
MCV: 86.3 fL (ref 80.0–100.0)
Platelets: 167 10*3/uL (ref 150–400)
RBC: 3.71 MIL/uL — ABNORMAL LOW (ref 4.22–5.81)
RDW: 14.6 % (ref 11.5–15.5)
WBC: 12.5 10*3/uL — ABNORMAL HIGH (ref 4.0–10.5)
nRBC: 0 % (ref 0.0–0.2)

## 2022-07-13 LAB — BASIC METABOLIC PANEL
Anion gap: 7 (ref 5–15)
BUN: 25 mg/dL — ABNORMAL HIGH (ref 8–23)
CO2: 24 mmol/L (ref 22–32)
Calcium: 8.3 mg/dL — ABNORMAL LOW (ref 8.9–10.3)
Chloride: 103 mmol/L (ref 98–111)
Creatinine, Ser: 0.88 mg/dL (ref 0.61–1.24)
GFR, Estimated: >60 mL/min (ref >60)
Glucose, Bld: 136 mg/dL — ABNORMAL HIGH (ref 70–99)
Potassium: 4.1 mmol/L (ref 3.5–5.1)
Sodium: 134 mmol/L — ABNORMAL LOW (ref 135–145)

## 2022-07-13 MED ORDER — DOCUSATE SODIUM 100 MG PO CAPS: 100.0000 mg | ORAL_CAPSULE | Freq: Two times a day (BID) | ORAL | 0 refills | Status: DC

## 2022-07-13 MED ORDER — TRAMADOL HCL 50 MG PO TABS: 50.0000 mg | ORAL_TABLET | Freq: Four times a day (QID) | ORAL | 0 refills | Status: DC

## 2022-07-13 MED ORDER — ASPIRIN 81 MG PO CHEW: 81.0000 mg | CHEWABLE_TABLET | Freq: Two times a day (BID) | ORAL | 0 refills | Status: DC

## 2022-07-13 MED ORDER — HYDROCODONE-ACETAMINOPHEN 7.5-325 MG PO TABS: 1.0000 | ORAL_TABLET | ORAL | 0 refills | Status: DC | PRN

## 2022-07-13 MED ORDER — METHOCARBAMOL 500 MG PO TABS: 500.0000 mg | ORAL_TABLET | Freq: Four times a day (QID) | ORAL | 2 refills | Status: DC | PRN

## 2022-07-13 MED ORDER — POLYETHYLENE GLYCOL 3350 17 G PO PACK: 17.0000 g | PACK | Freq: Every day | ORAL | 0 refills | Status: DC

## 2022-07-13 MED ORDER — CELECOXIB 200 MG PO CAPS: 200.0000 mg | ORAL_CAPSULE | Freq: Two times a day (BID) | ORAL | 2 refills | Status: DC

## 2022-07-13 NOTE — TOC Transition Note (Signed)
Transition of Care [facility]) - CM/SW Discharge Note   Patient Details  Name: Cody Palmer MRN: [id number] Date of Birth: 1932/10/10  Transition of Care [facility]) CM/SW Contact:  Iona Beard, [place] Phone Number: 07/13/2022, 12:28 PM   Clinical Narrative:    [facility] for [facility] needs. CSW spoke to [place] with [facility] who confirms [facility] has been prearranged for pt by Dr. Ruthe Mannan office. CSW confirmed Marjory Lies has the needed [place] orders. TOC signing off.   Final next level of care: Home w Home Health Services Barriers to Discharge: Barriers Resolved   Patient Goals and CMS Choice CMS Medicare.gov Compare Post Acute Care list provided to:: Patient Choice offered to / list presented to : Patient  Discharge Placement                         Discharge Plan and Services Additional resources added to the After Visit Summary for                            [facility] Arranged: PT [facility] Agency: [place] Date [place]: 07/13/22   Representative spoke with at [place]: [place] ([place]) Interventions SDOH Screenings   Tobacco Use: Medium Risk (07/12/2022)     Readmission Risk Interventions     No data to display

## 2022-07-13 NOTE — Care Management Obs Status (Signed)
[place] NOTIFICATION   Patient Details  Name: Cody Palmer MRN: [id number] Date of Birth: [birth year]   Medicare Observation Status Notification Given:  Yes    Tommy Medal 07/13/2022, 12:41 PM

## 2022-07-13 NOTE — Discharge Summary (Signed)
Physician Discharge Summary  Patient ID: Cody Palmer MRN: [id number] DOB/AGE: 09-17-1932 89 y.o.  Admit date: 07/12/2022 Discharge date: 07/13/2022  Admission Diagnoses: Osteoarthritis left knee  Discharge Diagnoses:  Principal Problem:   Osteoarthritis of left knee Active Problems:   Arthritis of left knee   Discharged Condition: good  Hospital Course: Unremarkable hospital course  On July 12, 2022 the patient had an uncomplicated left total knee which did require a augment for severe medial bone erosion.  This was done under spinal he tolerated it well  He was able to ambulate on the day of surgery and then increase his ambulation on postop day 2  February 27 he tolerated the stairs he was neurovascular intact he did have a little elevation in his CBGs but this should correct in the postop period  Consults: None  Significant Diagnostic Studies: labs:     Latest Ref Rng & Units 07/13/2022    5:04 AM 07/07/2022   12:55 PM 01/17/2021    6:50 PM  CBC  WBC 4.0 - 10.5 K/uL 12.5  8.0  8.1   Hemoglobin 13.0 - 17.0 g/dL 10.4  13.4  14.0   Hematocrit 39.0 - 52.0 % 32.0  40.8  41.3   Platelets 150 - 400 K/uL 167  228  224        Latest Ref Rng & Units 07/13/2022    5:04 AM 07/07/2022   12:55 PM 01/17/2021    7:35 PM  BMP  Glucose 70 - 99 mg/dL 136  112  104   BUN 8 - 23 mg/dL '25  24  22   '$ Creatinine 0.61 - 1.24 mg/dL 0.88  1.04  0.74   Sodium 135 - 145 mmol/L 134  130  135   Potassium 3.5 - 5.1 mmol/L 4.1  3.8  3.6   Chloride 98 - 111 mmol/L 103  96  104   CO2 22 - 32 mmol/L '24  22  24   '$ Calcium 8.9 - 10.3 mg/dL 8.3  9.0  8.6      and radiology:  Clarification was needed on the patient's postop x-ray film which indicated the patient had a new fracture  This was not a new fracture but notching of the femur  Most studies have shown that notching has no effect on postop course  Discharge range of motion 0 to 105 degrees   Discharge Exam: Blood pressure (!)  140/65, pulse 71, temperature 98.3 F (36.8 C), temperature source Oral, resp. rate 18, height '5\' 9"'$  (1.753 m), weight 93.9 kg, SpO2 93 %.   Disposition: Discharge disposition: 01-Home or Self Care       Discharge Instructions     Call MD / Call 911   Complete by: As directed    If you experience chest pain or shortness of breath, CALL 911 and be transported to the hospital emergency room.  If you develope a fever above 101 F, pus (white drainage) or increased drainage or redness at the wound, or calf pain, call your surgeon's office.   Constipation Prevention   Complete by: As directed    Drink plenty of fluids.  Prune juice may be helpful.  You may use a stool softener, such as Colace (over the counter) 100 mg twice a day.  Use MiraLax (over the counter) for constipation as needed.   Diet - low sodium heart healthy   Complete by: As directed    Discharge instructions   Complete by: As directed  Blue foam pillow under heel; 3 x a day for 30 min  CPM machine 0-90 for 2 weeks   White ted hose for 4 weeks   Aspirin 2 x a day for 35 days   Increase activity slowly as tolerated   Complete by: As directed    Post-operative opioid taper instructions:   Complete by: As directed    POST-OPERATIVE OPIOID TAPER INSTRUCTIONS: It is important to wean off of your opioid medication as soon as possible. If you do not need pain medication after your surgery it is ok to stop day one. Opioids include: Codeine, Hydrocodone(Norco, Vicodin), Oxycodone(Percocet, oxycontin) and hydromorphone amongst others.  Long term and even short term use of opiods can cause: Increased pain response Dependence Constipation Depression Respiratory depression And more.  Withdrawal symptoms can include Flu like symptoms Nausea, vomiting And more Techniques to manage these symptoms Hydrate well Eat regular healthy meals Stay active Use relaxation techniques(deep breathing, meditating, yoga) Do Not  substitute Alcohol to help with tapering If you have been on opioids for less than two weeks and do not have pain than it is ok to stop all together.  Plan to wean off of opioids This plan should start within one week post op of your joint replacement. Maintain the same interval or time between taking each dose and first decrease the dose.  Cut the total daily intake of opioids by one tablet each day Next start to increase the time between doses. The last dose that should be eliminated is the evening dose.         Allergies as of 07/13/2022   No Known Allergies      Medication List     STOP taking these medications    aspirin EC 81 MG tablet Replaced by: aspirin 81 MG chewable tablet       TAKE these medications    amLODipine 10 MG tablet Commonly known as: NORVASC Take 10 mg by mouth daily.   ascorbic acid 500 MG tablet Commonly known as: VITAMIN C Take 500 mg by mouth daily.   aspirin 81 MG chewable tablet Chew 1 tablet (81 mg total) by mouth 2 (two) times daily. Replaces: aspirin EC 81 MG tablet   celecoxib 200 MG capsule Commonly known as: CELEBREX Take 1 capsule (200 mg total) by mouth 2 (two) times daily.   docusate sodium 100 MG capsule Commonly known as: COLACE Take 1 capsule (100 mg total) by mouth 2 (two) times daily.   HYDROcodone-acetaminophen 7.5-325 MG tablet Commonly known as: NORCO Take 1-2 tablets by mouth every 4 (four) hours as needed for severe pain (pain score 7-10).   Jardiance 25 MG Tabs tablet Generic drug: empagliflozin Take 12.5 mg by mouth daily.   labetalol 300 MG tablet Commonly known as: NORMODYNE Take 300 mg by mouth 2 (two) times daily.   losartan-hydrochlorothiazide 100-25 MG tablet Commonly known as: HYZAAR Take 1 tablet by mouth daily.   metFORMIN 500 MG tablet Commonly known as: GLUCOPHAGE Take 500 mg by mouth 2 (two) times daily with a meal.   methocarbamol 500 MG tablet Commonly known as: ROBAXIN Take 1  tablet (500 mg total) by mouth every 6 (six) hours as needed for muscle spasms.   polyethylene glycol 17 g packet Commonly known as: MIRALAX / GLYCOLAX Take 17 g by mouth daily.   rosuvastatin 10 MG tablet Commonly known as: CRESTOR Take 10 mg by mouth every other day.   traMADol 50 MG tablet Commonly known as:  ULTRAM Take 1 tablet (50 mg total) by mouth every 6 (six) hours.   Vitamin D3 25 MCG (1000 UT) capsule Take 1,000 Units by mouth daily.   zinc gluconate 50 MG tablet Take 50 mg by mouth daily.         Signed: [name] 07/13/2022, 11:20 AM

## 2022-07-13 NOTE — Progress Notes (Signed)
Meds ordered this encounter  Medications   DISCONTD: HYDROcodone-acetaminophen (NORCO) 7.5-325 MG tablet    Sig: Take 1-2 tablets by mouth every 4 (four) hours as needed for severe pain (pain score 7-10).    Dispense:  30 tablet    Refill:  0   DISCONTD: traMADol (ULTRAM) 50 MG tablet    Sig: Take 1 tablet (50 mg total) by mouth every 6 (six) hours.    Dispense:  30 tablet    Refill:  0   HYDROcodone-acetaminophen (NORCO) 7.5-325 MG tablet    Sig: Take 1-2 tablets by mouth every 4 (four) hours as needed for severe pain (pain score 7-10).    Dispense:  30 tablet    Refill:  0   traMADol (ULTRAM) 50 MG tablet    Sig: Take 1 tablet (50 mg total) by mouth every 6 (six) hours.    Dispense:  30 tablet    Refill:  0   Printed and hand written   Will have to come to office to get rx

## 2022-07-13 NOTE — Progress Notes (Signed)
Patient ID: Cody Palmer, male   DOB: Sep 27, 1932, 89 y.o.   MRN: [id number]  POD 1 S/P LEFT TKA   BP (!) 140/65 (BP Location: Right Arm)   Pulse 71   Temp 98.3 F (36.8 C) (Oral)   Resp 18   Ht '5\' 9"'$  (1.753 m)   Wt 93.9 kg   SpO2 93%   BMI 30.57 kg/m   SUGARS RUNNING A LITTLE HIGH   Other than that, he's doing well  No s/s of DVT  MS alert awake     Latest Ref Rng & Units 07/13/2022    5:04 AM 07/07/2022   12:55 PM 01/17/2021    6:50 PM  CBC  WBC 4.0 - 10.5 K/uL 12.5  8.0  8.1   Hemoglobin 13.0 - 17.0 g/dL 10.4  13.4  14.0   Hematocrit 39.0 - 52.0 % 32.0  40.8  41.3   Platelets 150 - 400 K/uL 167  228  224       Latest Ref Rng & Units 07/13/2022    5:04 AM 07/07/2022   12:55 PM 01/17/2021    7:35 PM  BMP  Glucose 70 - 99 mg/dL 136  112  104   BUN 8 - 23 mg/dL '25  24  22   '$ Creatinine 0.61 - 1.24 mg/dL 0.88  1.04  0.74   Sodium 135 - 145 mmol/L 134  130  135   Potassium 3.5 - 5.1 mmol/L 4.1  3.8  3.6   Chloride 98 - 111 mmol/L 103  96  104   CO2 22 - 32 mmol/L '24  22  24   '$ Calcium 8.9 - 10.3 mg/dL 8.3  9.0  8.6      Anticipate d/c today

## 2022-07-13 NOTE — Progress Notes (Signed)
Physical Therapy Treatment Patient Details Name: Cody Palmer MRN: [id number] DOB: 1932-08-01 Today's Date: 07/13/2022  LEFT KNEE ROM: 0 - 105 degrees AMBULATION DISTANCE: 150 feet using RW with Modified [place]  History of Present Illness Cody Palmer is a 89 y/o male, s/p Left TKA on 07/12/22, with the diagnosis of left total knee arthroplasty.    PT Comments    Patient demonstrates good return for going up/down stairs in stairwell without loss of balance and understanding acknowledged, increased ROM left knee while self stretching at bedside up to 105 degrees and increased endurance/distance for gait training without loss of balance.  Patient tolerated sitting up in chair with LLE dangling after therapy.  Patient will benefit from continued skilled physical therapy in hospital and recommended venue below to increase strength, balance, endurance for safe ADLs and gait.    Recommendations for follow up therapy are one component of a multi-disciplinary discharge planning process, led by the attending physician.  Recommendations may be updated based on patient status, additional functional criteria and insurance authorization.  Follow Up Recommendations  Home health PT     Assistance Recommended at Discharge    Patient can return home with the following A little help with walking and/or transfers;A little help with bathing/dressing/bathroom;Help with stairs or ramp for entrance;Assistance with cooking/housework   Equipment Recommendations  None recommended by PT    Recommendations for Other Services       Precautions / Restrictions Precautions Precautions: Fall Restrictions Weight Bearing Restrictions: Yes LLE Weight Bearing: Weight bearing as tolerated     Mobility  Bed Mobility Overal bed mobility: Modified Independent                  Transfers Overall transfer level: Modified independent Equipment used: Rolling walker (2 wheels) Transfers: Sit  to/from Stand, Bed to chair/wheelchair/BSC Sit to Stand: Min guard, Supervision   Step pivot transfers: Modified independent (Device/Increase time)       General transfer comment: good return for transferring from bed to chair    Ambulation/Gait Ambulation/Gait assistance: Modified independent (Device/Increase time) Gait Distance (Feet): 150 Feet Assistive device: Rolling walker (2 wheels) Gait Pattern/deviations: Step-to pattern, Decreased step length - right, Decreased step length - left, Decreased stride length, Antalgic Gait velocity: slightly decreased     General Gait Details: increased endurance/distance for gait training with fair/good return for left heel to toe stepping without loss of balance   Stairs Stairs: Yes Stairs assistance: Modified independent (Device/Increase time) Stair Management: Two rails, Step to pattern Number of Stairs: 10 General stair comments: demonstrates good return for going up/down 10 steps in stair well using bilateral siderails without loss of balance and understanding acknowledged   Wheelchair Mobility    Modified Rankin (Stroke Patients Only)       Balance Overall balance assessment: Needs assistance Sitting-balance support: Feet supported, No upper extremity supported Sitting balance-Leahy Scale: Good Sitting balance - Comments: seated at EOB   Standing balance support: During functional activity, Bilateral upper extremity supported Standing balance-Leahy Scale: Good Standing balance comment: using RW                            Cognition Arousal/Alertness: Awake/alert Behavior During Therapy: WFL for tasks assessed/performed Overall Cognitive Status: Within Functional Limits for tasks assessed  Exercises Total Joint Exercises Ankle Circles/Pumps: Supine, 10 reps, Left, Strengthening, AROM Quad Sets: AROM, Strengthening, Left, 10 reps, Supine Short Arc  Quad: AROM, Strengthening, Left, 10 reps, Supine Heel Slides: AROM, Strengthening, Left, 10 reps, Supine Goniometric ROM: left knee: 0 - 105 degrees    General Comments        Pertinent Vitals/Pain Pain Assessment Pain Assessment: 0-10 Pain Score: 3  Pain Location: left knee Pain Descriptors / Indicators: Sore Pain Intervention(s): Limited activity within patient's tolerance, Monitored during session, Repositioned    Home Living                          Prior Function            PT Goals (current goals can now be found in the care plan section) Acute Rehab PT Goals Patient Stated Goal: return home with family to assist PT Goal Formulation: With patient Time For Goal Achievement: 07/13/22 Potential to Achieve Goals: Good Progress towards PT goals: Progressing toward goals    Frequency    BID      PT Plan Current plan remains appropriate    Co-evaluation              AM-PAC PT "6 Clicks" Mobility   Outcome Measure  Help needed turning from your back to your side while in a flat bed without using bedrails?: None Help needed moving from lying on your back to sitting on the side of a flat bed without using bedrails?: None Help needed moving to and from a bed to a chair (including a wheelchair)?: None Help needed standing up from a chair using your arms (e.g., wheelchair or bedside chair)?: None Help needed to walk in hospital room?: A Little Help needed climbing 3-5 steps with a railing? : A Little 6 Click Score: 22    End of Session   Activity Tolerance: Patient tolerated treatment well;Patient limited by fatigue Patient left: in chair;with call bell/phone within reach Nurse Communication: Mobility status PT Visit Diagnosis: Unsteadiness on feet (R26.81);Other abnormalities of gait and mobility (R26.89);Muscle weakness (generalized) (M62.81)     Time: [id number] PT Time Calculation (min) (ACUTE ONLY): 29 min  Charges:  $Gait Training: 8-22  mins $Therapeutic Exercise: 8-22 mins                     8:40 AM, 07/13/22 Cody Palmer, MPT Physical Therapist with [facility] [phone] office [phone] mobile phone

## 2022-07-14 ENCOUNTER — Telehealth: Payer: Self-pay | Admitting: Orthopedic Surgery

## 2022-07-14 MED ORDER — METHOCARBAMOL 500 MG PO TABS: 500.0000 mg | ORAL_TABLET | Freq: Four times a day (QID) | ORAL | 2 refills | Status: DC | PRN

## 2022-07-14 MED ORDER — CELECOXIB 200 MG PO CAPS: 200.0000 mg | ORAL_CAPSULE | Freq: Two times a day (BID) | ORAL | 2 refills | Status: DC

## 2022-07-14 NOTE — Telephone Encounter (Signed)
[place] w/[facility] [phone] would like to speak w/[name]

## 2022-07-14 NOTE — Telephone Encounter (Signed)
I called patient is doing well 2 x wk this week 3x next week then 1 time the following week   He did not get Celebrex and Robaxin They are having computer problems at [facility] Will leave them at desk someone will pick up

## 2022-07-14 NOTE — Telephone Encounter (Signed)
The Celebrex and Robaxin Rx are at front desk for pick up

## 2022-07-19 ENCOUNTER — Encounter (HOSPITAL_COMMUNITY): Payer: Self-pay | Admitting: Orthopedic Surgery

## 2022-07-25 ENCOUNTER — Encounter: Payer: Medicare Other | Admitting: Orthopedic Surgery

## 2022-07-25 DIAGNOSIS — Z96652 Presence of left artificial knee joint: Secondary | ICD-10-CM

## 2022-07-26 ENCOUNTER — Other Ambulatory Visit: Payer: Self-pay | Admitting: Orthopedic Surgery

## 2022-07-26 ENCOUNTER — Encounter: Payer: Self-pay | Admitting: Orthopedic Surgery

## 2022-07-26 ENCOUNTER — Ambulatory Visit (INDEPENDENT_AMBULATORY_CARE_PROVIDER_SITE_OTHER): Payer: Medicare Other | Admitting: Orthopedic Surgery

## 2022-07-26 ENCOUNTER — Telehealth: Payer: Self-pay | Admitting: Orthopedic Surgery

## 2022-07-26 DIAGNOSIS — Z96652 Presence of left artificial knee joint: Secondary | ICD-10-CM

## 2022-07-26 MED ORDER — TRAMADOL HCL 50 MG PO TABS: 50.0000 mg | ORAL_TABLET | Freq: Four times a day (QID) | ORAL | 0 refills | Status: DC

## 2022-07-26 NOTE — Telephone Encounter (Signed)
Cody Palmer/daughter called, lvm stating they got a call from [facility] in [place], they have an opening for 3/13, so they are going there instead of [facility] since they can see him sooner.  Patent just wanted to let Dr. Aline Brochure know.

## 2022-07-26 NOTE — Addendum Note (Signed)
Addended by: Santo Held on: 07/26/2022 10:37 AM   Modules accepted: Orders

## 2022-07-26 NOTE — Addendum Note (Signed)
Addended byCandice Camp on: 07/26/2022 01:04 PM   Modules accepted: Orders

## 2022-07-26 NOTE — Progress Notes (Signed)
Encounter Diagnosis  Name Primary?   Status post total left knee replacement (07/12/2022) Yes    Chief Complaint  Patient presents with   Post-op Follow-up    Left knee replaced 07/12/22 improving / does not have OP therapy set up yet      Diagnoses:  osteoarthritis left knee with fixed varus deformity which was moderate   Procedure: Left total knee arthroplasty   Operative Finding Successful completion of the planned procedure.  See brief op note but there was severe arthritis moderate varus deformity greater than 10 degrees but less than 20 degrees the medial side of the joint had grooves in the bone and look like marble large osteophyte surrounding all 3 compartments   Stenotic notch with no ACL visible no medial meniscus that was readily visible lateral meniscus was intact grade I chondromalacia lateral plateau and lateral femur  Cody Palmer is doing well he is only taking tramadol for pain  His staples were removed his wound looks good his flexion is like 115 already  Recommend PT refill tramadol come back in 3 weeks

## 2022-07-26 NOTE — Patient Instructions (Addendum)
Therapy will have to be done at another facility   [facility] [phone]

## 2022-07-26 NOTE — Telephone Encounter (Signed)
Can you please resend for Dr Aline Brochure? He selected print instead of e prescribe.

## 2022-07-26 NOTE — Telephone Encounter (Signed)
Patient's daughter Ladona Horns called, stated that the pharmacy doesn't have the Tramadol script and the chart says it was printed, but the daughter said she wasn't given anything.  Patient was seen today by Dr. Aline Brochure.

## 2022-07-27 ENCOUNTER — Other Ambulatory Visit: Payer: Self-pay

## 2022-07-27 ENCOUNTER — Ambulatory Visit (HOSPITAL_COMMUNITY): Payer: Medicare Other | Attending: Orthopedic Surgery | Admitting: Physical Therapy

## 2022-07-27 ENCOUNTER — Encounter (HOSPITAL_COMMUNITY): Payer: Self-pay | Admitting: Physical Therapy

## 2022-07-27 DIAGNOSIS — M6281 Muscle weakness (generalized): Secondary | ICD-10-CM

## 2022-07-27 DIAGNOSIS — M25562 Pain in left knee: Secondary | ICD-10-CM

## 2022-07-27 DIAGNOSIS — R29898 Other symptoms and signs involving the musculoskeletal system: Secondary | ICD-10-CM | POA: Diagnosis present

## 2022-07-27 DIAGNOSIS — R2689 Other abnormalities of gait and mobility: Secondary | ICD-10-CM | POA: Diagnosis present

## 2022-07-27 DIAGNOSIS — M1712 Unilateral primary osteoarthritis, left knee: Secondary | ICD-10-CM | POA: Insufficient documentation

## 2022-07-27 NOTE — Therapy (Signed)
OUTPATIENT PHYSICAL THERAPY LOWER EXTREMITY EVALUATION   Patient Name: Cody Palmer MRN: [id number] DOB:[birth year], 89 y.o., male Today's Date: 07/27/2022  END OF SESSION:  PT End of Session - 07/27/22 1402     Visit Number 1    Number of Visits 12    Date for PT Re-Evaluation 09/07/22    Authorization Type BCBS Medicare    PT Start Time 1403    PT Stop Time 1449    PT Time Calculation (min) 46 min    Activity Tolerance Patient tolerated treatment well    Behavior During Therapy WFL for tasks assessed/performed             Past Medical History:  Diagnosis Date   Arthritis    Cancer ([place])    Skin cancer   Diabetes mellitus without complication ([place])    HTN (hypertension)    Hypercholesteremia    Past Surgical History:  Procedure Laterality Date   CATARACT EXTRACTION W/PHACO Left 04/25/2016   Procedure: CATARACT EXTRACTION PHACO AND INTRAOCULAR LENS PLACEMENT LEFT EYE;  Surgeon: Tonny Branch, MD;  Location: [facility];  Service: Ophthalmology;  Laterality: Left;  CDE: 10.37   CYSTOSCOPY WITH BIOPSY N/A 08/13/2013   Procedure: CYSTOSCOPY WITH BLADDER BIOPSY;  Surgeon: Franchot Gallo, MD;  Location: [facility];  Service: Urology;  Laterality: N/A;   ENUCLEATION Right    Removed in [place]   removal of eye   TOTAL KNEE ARTHROPLASTY Right 01/11/2013   Procedure: RIGHT TOTAL KNEE ARTHROPLASTY;  Surgeon: Carole Civil, MD;  Location: [facility];  Service: Orthopedics;  Laterality: Right;   TOTAL KNEE ARTHROPLASTY Left 07/12/2022   Procedure: TOTAL KNEE ARTHROPLASTY;  Surgeon: Carole Civil, MD;  Location: [facility];  Service: Orthopedics;  Laterality: Left;   Patient Active Problem List   Diagnosis Date Noted   Arthritis of left knee 07/12/2022   Osteoarthritis of left knee 07/12/2022   Osteoarthrosis, unspecified whether generalized or localized, lower leg 12/31/2013   S/P total knee replacement 02/21/2013   Stiffness of right knee 01/28/2013    Weakness of right leg 01/28/2013   Difficulty in walking(719.7) 01/28/2013   Arthritis of knee, degenerative 12/13/2012    PCP: [facility]  REFERRING PROVIDER: Carole Civil, MD  REFERRING DIAG: [phone] (ICD-10-CM) - Primary osteoarthritis of left knee  THERAPY DIAG:  Left knee pain, unspecified chronicity  Muscle weakness (generalized)  Other abnormalities of gait and mobility  Other symptoms and signs involving the musculoskeletal system  Rationale for Evaluation and Treatment: Rehabilitation  ONSET DATE: DOS: 07/12/22  SUBJECTIVE:   SUBJECTIVE STATEMENT: Patient states soreness and swelling in knee. Has been doing HEP from [facility]. He was having some pain and weakness in L knee causing trouble getting around.   PERTINENT HISTORY: Hx R TKA 2014, hx cancer, DM, HTN, HLD PAIN:  Are you having pain? Yes: NPRS scale: 3-4/10 Pain location: L knee Pain description: sore Aggravating factors: bending  Relieving factors: meds, ice  PRECAUTIONS: None  WEIGHT BEARING RESTRICTIONS: No  FALLS:  Has patient fallen in last 6 months? No  OCCUPATION: Retired  PLOF: Independent  PATIENT GOALS: get it working again, get back to golfing  NEXT MD VISIT: 2 weeks  OBJECTIVE:    PATIENT SURVEYS: FOTO 42% function  COGNITION: Overall cognitive status: Within functional limits for tasks assessed     SENSATION: WFL   POSTURE: No Significant postural limitations  PALPATION: Grossly tender L knee  LOWER EXTREMITY  ROM:  Active ROM Right eval Left eval  Hip flexion    Hip extension    Hip abduction    Hip adduction    Hip internal rotation    Hip external rotation    Knee flexion 125 105  Knee extension 0 Lacking 3  Ankle dorsiflexion    Ankle plantarflexion    Ankle inversion    Ankle eversion     (Blank rows = not tested)  LOWER EXTREMITY MMT:  MMT Right eval Left eval  Hip flexion 5 4  Hip extension    Hip abduction    Hip adduction    Hip  internal rotation    Hip external rotation    Knee flexion 5 4+  Knee extension 5 4-  Ankle dorsiflexion 5 5  Ankle plantarflexion    Ankle inversion    Ankle eversion     (Blank rows = not tested)   FUNCTIONAL TESTS:  5 times sit to stand: 13.45 seconds 2 minute walk test: 350 feet with spc Stairs: 7 inch step too pattern, with frequent cueing able to complete with alternating pattern; heavy UE support, decreased strength and motor control   GAIT: Distance walked: 350 feet Assistive device utilized: Single point cane Level of assistance: Modified independence Comments: 2MWT, limited L knee flexion/extension   TODAY'S TREATMENT:                                                                                                                              DATE:  07/27/22 Quad sets 10 x 10 second holds SLR 1 x 10  Heel slides 1 x 10 with 10 second holds LAQ 10 x 5 second holds  Ankle pumps x 20    PATIENT EDUCATION:  Education details: Patient educated on exam findings, POC, scope of PT, HEP. Person educated: Patient Education method: Explanation, Demonstration, and Handouts Education comprehension: verbalized understanding, returned demonstration, verbal cues required, and tactile cues required  HOME EXERCISE PROGRAM: Access Code: [email or web address] URL: [email or web address]  Date: 07/27/2022 - Long Sitting Quad Set with Towel Roll Under Heel  - 3-5 x daily - 7 x weekly - 10 reps - 10 second hold - Active Straight Leg Raise with Quad Set  - 3-5 x daily - 7 x weekly - 2 sets - 10 reps - Supine Heel Slide with Strap  - 3-5 x daily - 7 x weekly - 10 reps - 10 second hold - Seated Long Arc Quad  - 3-5 x daily - 7 x weekly - 10 reps - 5 second hold - Seated Ankle Pumps  - 3-5 x daily - 7 x weekly - 20 reps  ASSESSMENT:  CLINICAL IMPRESSION: Patient a 89 y.o. y.o. male who was seen today for physical therapy evaluation and treatment for s/p L TKA on 07/12/22. Patient  presents with pain limited deficits in L knee strength, ROM, endurance, activity tolerance, gait, balance, and functional mobility with ADL. Patient is  having to modify and restrict ADL as indicated by outcome measure score as well as subjective information and objective measures which is affecting overall participation. Patient will benefit from skilled physical therapy in order to improve function and reduce impairment.  OBJECTIVE IMPAIRMENTS: Abnormal gait, decreased activity tolerance, decreased endurance, decreased mobility, difficulty walking, decreased ROM, decreased strength, increased edema, increased muscle spasms, impaired flexibility, improper body mechanics, and pain.   ACTIVITY LIMITATIONS: carrying, lifting, bending, standing, squatting, sleeping, stairs, transfers, hygiene/grooming, locomotion level, and caring for others  PARTICIPATION LIMITATIONS: meal prep, cleaning, laundry, shopping, community activity, and yard work  PERSONAL FACTORS: Age and 3+ comorbidities: Hx R TKA 2014, hx cancer, DM, HTN, HLD  are also affecting patient's functional outcome.   REHAB POTENTIAL: Good  CLINICAL DECISION MAKING: Stable/uncomplicated  EVALUATION COMPLEXITY: Low   GOALS: Goals reviewed with patient? Yes  SHORT TERM GOALS: Target date: 08/17/2022    Patient will be independent with HEP in order to improve functional outcomes. Baseline: Goal status: INITIAL  2.  Patient will report at least 25% improvement in symptoms for improved quality of life. Baseline: Goal status: INITIAL    LONG TERM GOALS: Target date: 09/07/2022    Patient will report at least 75% improvement in symptoms for improved quality of life. Baseline:  Goal status: INITIAL  2.  Patient will improve FOTO score to predicted outcomes in order to indicate improved tolerance to activity. Baseline: 42% function Goal status: INITIAL  3.  Patient will be able to navigate stairs with reciprocal pattern without  compensation in order to demonstrate improved LE strength. Baseline:  7 inch step too pattern, with frequent cueing able to complete with alternating pattern; heavy UE support, decreased strength and motor control  Goal status: INITIAL  4.  Patient will improve ROM for L knee extension/flexion to 0-120 degrees to improve squatting, and other functional mobility. Baseline: lacking 3 to 105 Goal status: INITIAL  5.  Patient will be able to complete 5x STS in under 11.4 seconds in order to reduce the risk of falls. Baseline: 13.45 seconds without UE support Goal status: INITIAL  6.  Patient will demonstrate grade of 5/5 MMT grade in all tested musculature as evidence of improved strength to assist with stair ambulation and gait.   Baseline: see above Goal status: INITIAL   PLAN:  PT FREQUENCY: 2x/week  PT DURATION: 6 weeks  PLANNED INTERVENTIONS: Therapeutic exercises, Therapeutic activity, Neuromuscular re-education, Balance training, Gait training, Patient/Family education, Joint manipulation, Joint mobilization, Stair training, Orthotic/Fit training, DME instructions, Aquatic Therapy, Dry Needling, Electrical stimulation, Spinal manipulation, Spinal mobilization, Cryotherapy, Moist heat, Compression bandaging, scar mobilization, Splintting, Taping, Traction, Ultrasound, Ionotophoresis '4mg'$ /ml Dexamethasone, and Manual therapy  PLAN FOR NEXT SESSION: L knee mobility and strength and progress as able   Mearl Latin, PT 07/27/2022, 3:01 PM

## 2022-07-29 ENCOUNTER — Ambulatory Visit (HOSPITAL_COMMUNITY): Payer: Medicare Other | Admitting: Physical Therapy

## 2022-07-29 DIAGNOSIS — M6281 Muscle weakness (generalized): Secondary | ICD-10-CM

## 2022-07-29 DIAGNOSIS — M25562 Pain in left knee: Secondary | ICD-10-CM

## 2022-07-29 DIAGNOSIS — R2689 Other abnormalities of gait and mobility: Secondary | ICD-10-CM

## 2022-07-29 DIAGNOSIS — R29898 Other symptoms and signs involving the musculoskeletal system: Secondary | ICD-10-CM

## 2022-07-29 NOTE — Therapy (Signed)
OUTPATIENT PHYSICAL THERAPY LOWER EXTREMITY Treatment   Patient Name: Cody Palmer MRN: [id number] DOB:10/23/1932, 89 y.o., male Today's Date: 07/29/2022  END OF SESSION:  PT End of Session - 07/29/22 1203     Visit Number 2    Number of Visits 12    Date for PT Re-Evaluation 09/07/22    Authorization Type BCBS Medicare    PT Start Time 1120    PT Stop Time 1200    PT Time Calculation (min) 40 min    Activity Tolerance Patient tolerated treatment well    Behavior During Therapy WFL for tasks assessed/performed              Past Medical History:  Diagnosis Date   Arthritis    Cancer ([place])    Skin cancer   Diabetes mellitus without complication ([place])    HTN (hypertension)    Hypercholesteremia    Past Surgical History:  Procedure Laterality Date   CATARACT EXTRACTION W/PHACO Left 04/25/2016   Procedure: CATARACT EXTRACTION PHACO AND INTRAOCULAR LENS PLACEMENT LEFT EYE;  Surgeon: Tonny Branch, MD;  Location: [facility];  Service: Ophthalmology;  Laterality: Left;  CDE: 10.37   CYSTOSCOPY WITH BIOPSY N/A 08/13/2013   Procedure: CYSTOSCOPY WITH BLADDER BIOPSY;  Surgeon: Franchot Gallo, MD;  Location: [facility];  Service: Urology;  Laterality: N/A;   ENUCLEATION Right    Removed in [place]   removal of eye   TOTAL KNEE ARTHROPLASTY Right 01/11/2013   Procedure: RIGHT TOTAL KNEE ARTHROPLASTY;  Surgeon: Carole Civil, MD;  Location: [facility];  Service: Orthopedics;  Laterality: Right;   TOTAL KNEE ARTHROPLASTY Left 07/12/2022   Procedure: TOTAL KNEE ARTHROPLASTY;  Surgeon: Carole Civil, MD;  Location: [facility];  Service: Orthopedics;  Laterality: Left;   Patient Active Problem List   Diagnosis Date Noted   Arthritis of left knee 07/12/2022   Osteoarthritis of left knee 07/12/2022   Osteoarthrosis, unspecified whether generalized or localized, lower leg 12/31/2013   S/P total knee replacement 02/21/2013   Stiffness of right knee 01/28/2013    Weakness of right leg 01/28/2013   Difficulty in walking(719.7) 01/28/2013   Arthritis of knee, degenerative 12/13/2012    PCP: [facility]  REFERRING PROVIDER: Carole Civil, MD  REFERRING DIAG: [phone] (ICD-10-CM) - Primary osteoarthritis of left knee  THERAPY DIAG:  Left knee pain, unspecified chronicity  Muscle weakness (generalized)  Other abnormalities of gait and mobility  Other symptoms and signs involving the musculoskeletal system  Rationale for Evaluation and Treatment: Rehabilitation  ONSET DATE: DOS: 07/12/22  SUBJECTIVE:   SUBJECTIVE STATEMENT: PT states that he has been completing his exercises. Pt is doing some of the exercises.  He had therapy 2 days straight which really got to him.  PERTINENT HISTORY: Hx R TKA 2014, hx cancer, DM, HTN, HLD PAIN:  Are you having pain? Yes: NPRS scale: 3-4/10 Pain location: L knee Pain description: sore Aggravating factors: bending  Relieving factors: meds, ice  PRECAUTIONS: None  WEIGHT BEARING RESTRICTIONS: No  FALLS:  Has patient fallen in last 6 months? No  OCCUPATION: Retired  PLOF: Independent  PATIENT GOALS: get it working again, get back to golfing  NEXT MD VISIT: 2 weeks  OBJECTIVE:    PATIENT SURVEYS: FOTO 42% function  COGNITION: Overall cognitive status: Within functional limits for tasks assessed     SENSATION: WFL   POSTURE: No Significant postural limitations  PALPATION: Grossly tender L knee  LOWER  EXTREMITY ROM:  Active ROM Right eval Left eval  Hip flexion    Hip extension    Hip abduction    Hip adduction    Hip internal rotation    Hip external rotation    Knee flexion 125 105  Knee extension 0 Lacking 3  Ankle dorsiflexion    Ankle plantarflexion    Ankle inversion    Ankle eversion     (Blank rows = not tested)  LOWER EXTREMITY MMT:  MMT Right eval Left eval  Hip flexion 5 4  Hip extension    Hip abduction    Hip adduction    Hip internal  rotation    Hip external rotation    Knee flexion 5 4+  Knee extension 5 4-  Ankle dorsiflexion 5 5  Ankle plantarflexion    Ankle inversion    Ankle eversion     (Blank rows = not tested)   FUNCTIONAL TESTS:  5 times sit to stand: 13.45 seconds 2 minute walk test: 350 feet with spc Stairs: 7 inch step too pattern, with frequent cueing able to complete with alternating pattern; heavy UE support, decreased strength and motor control   GAIT: Distance walked: 350 feet Assistive device utilized: Single point cane Level of assistance: Modified independence Comments: 2MWT, limited L knee flexion/extension   TODAY'S TREATMENT:                                                                                                                              DATE:  07/29/22 Bike for improve motion full revolution 14 Heel raise x 10 Squat x 10 Knee flexion x 10  Lunging onto 12" step Terminal extension standing x 10  Step up onto 4" step x 10 Sitting Long arc quad 3# x 10  Sit to stand  Heel slide x 10  Supine:   Quad set x 10 Manual to decrease edema and pain.   07/27/22 Quad sets 10 x 10 second holds SLR 1 x 10  Heel slides 1 x 10 with 10 second holds LAQ 10 x 5 second holds  Ankle pumps x 20    PATIENT EDUCATION:  Education details: Patient educated on exam findings, POC, scope of PT, HEP. Person educated: Patient Education method: Explanation, Demonstration, and Handouts Education comprehension: verbalized understanding, returned demonstration, verbal cues required, and tactile cues required  HOME EXERCISE PROGRAM: 3/15/:- Sit to Stand with Arm Swing  - 1 x daily - 7 x weekly - 1 sets - 10 reps - Heel Raises with Counter Support  - 1 x daily - 7 x weekly - 1 sets - 10 reps - 3-5" hold - Mini Squat  - 1 x daily - 7 x weekly - 1 sets - 10 reps - 3-5" hold  Access Code: [email or web address] URL: [email or web address]  Date: 07/27/2022 - Long Sitting Quad Set with Towel  Roll Under Heel  - 3-5 x daily - 7 x weekly -  10 reps - 10 second hold - Active Straight Leg Raise with Quad Set  - 3-5 x daily - 7 x weekly - 2 sets - 10 reps - Supine Heel Slide with Strap  - 3-5 x daily - 7 x weekly - 10 reps - 10 second hold - Seated Long Arc Quad  - 3-5 x daily - 7 x weekly - 10 reps - 5 second hold - Seated Ankle Pumps  - 3-5 x daily - 7 x weekly - 20 reps  ASSESSMENT:  CLINICAL IMPRESSION: Evaluation and goals reviewed with patient.  Therapist progressed pt to standing activities with update of HEP.   Patient continues to presents with pain limited deficits in L knee strength, ROM, endurance, activity tolerance, gait, balance, and functional mobility with ADL. Patient is having to modify and restrict ADL as indicated by outcome measure score as well as subjective information and objective measures which is affecting overall participation. Patient will benefit from skilled physical therapy in order to improve function and reduce impairment.  OBJECTIVE IMPAIRMENTS: Abnormal gait, decreased activity tolerance, decreased endurance, decreased mobility, difficulty walking, decreased ROM, decreased strength, increased edema, increased muscle spasms, impaired flexibility, improper body mechanics, and pain.   ACTIVITY LIMITATIONS: carrying, lifting, bending, standing, squatting, sleeping, stairs, transfers, hygiene/grooming, locomotion level, and caring for others  PARTICIPATION LIMITATIONS: meal prep, cleaning, laundry, shopping, community activity, and yard work  PERSONAL FACTORS: Age and 3+ comorbidities: Hx R TKA 2014, hx cancer, DM, HTN, HLD  are also affecting patient's functional outcome.   REHAB POTENTIAL: Good  CLINICAL DECISION MAKING: Stable/uncomplicated  EVALUATION COMPLEXITY: Low   GOALS: Goals reviewed with patient? Yes  SHORT TERM GOALS: Target date: 08/17/2022    Patient will be independent with HEP in order to improve functional  outcomes. Baseline: Goal status: IN PROGRESS  2.  Patient will report at least 25% improvement in symptoms for improved quality of life. Baseline: Goal status: IN PROGRESS    LONG TERM GOALS: Target date: 09/07/2022    Patient will report at least 75% improvement in symptoms for improved quality of life. Baseline:  Goal status: IN PROGRESS  2.  Patient will improve FOTO score to predicted outcomes in order to indicate improved tolerance to activity. Baseline: 42% function Goal status: IN PROGRESS  3.  Patient will be able to navigate stairs with reciprocal pattern without compensation in order to demonstrate improved LE strength. Baseline:  7 inch step too pattern, with frequent cueing able to complete with alternating pattern; heavy UE support, decreased strength and motor control  Goal status: IN PROGRESS  4.  Patient will improve ROM for L knee extension/flexion to 0-120 degrees to improve squatting, and other functional mobility. Baseline: lacking 3 to 105 Goal status: IN PROGRESS  5.  Patient will be able to complete 5x STS in under 11.4 seconds in order to reduce the risk of falls. Baseline: 13.45 seconds without UE support Goal status: IN PROGRESS  6.  Patient will demonstrate grade of 5/5 MMT grade in all tested musculature as evidence of improved strength to assist with stair ambulation and gait.   Baseline: see above Goal status: IN PROGRESS   PLAN:  PT FREQUENCY: 2x/week  PT DURATION: 6 weeks  PLANNED INTERVENTIONS: Therapeutic exercises, Therapeutic activity, Neuromuscular re-education, Balance training, Gait training, Patient/Family education, Joint manipulation, Joint mobilization, Stair training, Orthotic/Fit training, DME instructions, Aquatic Therapy, Dry Needling, Electrical stimulation, Spinal manipulation, Spinal mobilization, Cryotherapy, Moist heat, Compression bandaging, scar mobilization, Splintting, Taping, Traction, Ultrasound,  Ionotophoresis  4mg /ml Dexamethasone, and Manual therapy  PLAN FOR NEXT SESSION: L knee mobility and strength and progress as able Rayetta Humphrey, PT CLT [phone]  12:00

## 2022-08-02 ENCOUNTER — Ambulatory Visit (HOSPITAL_COMMUNITY): Payer: Medicare Other

## 2022-08-02 DIAGNOSIS — R2689 Other abnormalities of gait and mobility: Secondary | ICD-10-CM

## 2022-08-02 DIAGNOSIS — M25562 Pain in left knee: Secondary | ICD-10-CM | POA: Diagnosis not present

## 2022-08-02 DIAGNOSIS — M6281 Muscle weakness (generalized): Secondary | ICD-10-CM

## 2022-08-02 NOTE — Therapy (Signed)
OUTPATIENT PHYSICAL THERAPY LOWER EXTREMITY Treatment   Patient Name: Cody Palmer MRN: [id number] DOB:[birth year], 89 y.o., male Today's Date: 08/02/2022  END OF SESSION:  PT End of Session - 08/02/22 0952     Visit Number 3    Number of Visits 12    Date for PT Re-Evaluation 09/07/22    Authorization Type BCBS Medicare    PT Start Time 0945    PT Stop Time 1025    PT Time Calculation (min) 40 min    Activity Tolerance Patient tolerated treatment well    Behavior During Therapy WFL for tasks assessed/performed            Past Medical History:  Diagnosis Date   Arthritis    Cancer ([place])    Skin cancer   Diabetes mellitus without complication ([place])    HTN (hypertension)    Hypercholesteremia    Past Surgical History:  Procedure Laterality Date   CATARACT EXTRACTION W/PHACO Left 04/25/2016   Procedure: CATARACT EXTRACTION PHACO AND INTRAOCULAR LENS PLACEMENT LEFT EYE;  Surgeon: Tonny Branch, MD;  Location: [facility];  Service: Ophthalmology;  Laterality: Left;  CDE: 10.37   CYSTOSCOPY WITH BIOPSY N/A 08/13/2013   Procedure: CYSTOSCOPY WITH BLADDER BIOPSY;  Surgeon: Franchot Gallo, MD;  Location: [facility];  Service: Urology;  Laterality: N/A;   ENUCLEATION Right    Removed in [place]   removal of eye   TOTAL KNEE ARTHROPLASTY Right 01/11/2013   Procedure: RIGHT TOTAL KNEE ARTHROPLASTY;  Surgeon: Carole Civil, MD;  Location: [facility];  Service: Orthopedics;  Laterality: Right;   TOTAL KNEE ARTHROPLASTY Left 07/12/2022   Procedure: TOTAL KNEE ARTHROPLASTY;  Surgeon: Carole Civil, MD;  Location: [facility];  Service: Orthopedics;  Laterality: Left;   Patient Active Problem List   Diagnosis Date Noted   Arthritis of left knee 07/12/2022   Osteoarthritis of left knee 07/12/2022   Osteoarthrosis, unspecified whether generalized or localized, lower leg 12/31/2013   S/P total knee replacement 02/21/2013   Stiffness of right knee 01/28/2013   Weakness  of right leg 01/28/2013   Difficulty in walking(719.7) 01/28/2013   Arthritis of knee, degenerative 12/13/2012    PCP: [facility]  REFERRING PROVIDER: Carole Civil, MD  REFERRING DIAG: [phone] (ICD-10-CM) - Primary osteoarthritis of left knee  THERAPY DIAG:  Left knee pain, unspecified chronicity  Muscle weakness (generalized)  Other abnormalities of gait and mobility  Rationale for Evaluation and Treatment: Rehabilitation  ONSET DATE: DOS: 07/12/22  SUBJECTIVE:   SUBJECTIVE STATEMENT: Currently reports of L knee pain = 3/10. Patient reports that he did well after the last session.   PERTINENT HISTORY: Hx R TKA 2014, hx cancer, DM, HTN, HLD PAIN:  Are you having pain? Yes: NPRS scale: 3-4/10 Pain location: L knee Pain description: sore Aggravating factors: bending  Relieving factors: meds, ice  PRECAUTIONS: None  WEIGHT BEARING RESTRICTIONS: No  FALLS:  Has patient fallen in last 6 months? No  OCCUPATION: Retired  PLOF: Independent  PATIENT GOALS: get it working again, get back to golfing  NEXT MD VISIT: 2 weeks  OBJECTIVE:    PATIENT SURVEYS: FOTO 42% function  COGNITION: Overall cognitive status: Within functional limits for tasks assessed     SENSATION: WFL   POSTURE: No Significant postural limitations  PALPATION: Grossly tender L knee  LOWER EXTREMITY ROM:  Active ROM Right eval Left eval  Hip flexion    Hip extension  Hip abduction    Hip adduction    Hip internal rotation    Hip external rotation    Knee flexion 125 105  Knee extension 0 Lacking 3  Ankle dorsiflexion    Ankle plantarflexion    Ankle inversion    Ankle eversion     (Blank rows = not tested)  LOWER EXTREMITY MMT:  MMT Right eval Left eval  Hip flexion 5 4  Hip extension    Hip abduction    Hip adduction    Hip internal rotation    Hip external rotation    Knee flexion 5 4+  Knee extension 5 4-  Ankle dorsiflexion 5 5  Ankle  plantarflexion    Ankle inversion    Ankle eversion     (Blank rows = not tested)   FUNCTIONAL TESTS:  5 times sit to stand: 13.45 seconds 2 minute walk test: 350 feet with spc Stairs: 7 inch step too pattern, with frequent cueing able to complete with alternating pattern; heavy UE support, decreased strength and motor control   GAIT: Distance walked: 350 feet Assistive device utilized: Single point cane Level of assistance: Modified independence Comments: 2MWT, limited L knee flexion/extension   TODAY'S TREATMENT:                                                                                                                              DATE:  08/02/22 Bike for improve motion full revolution 14 Seated hamstring stretch x 30" x 3 Knee drives onto 12" step x 2' Standing TKE, RTB x 3" x 30 Gastrocnemius slant board stretch x 30" x 3 Sit-to-stand x 10 x 2, no UE support Heel/toe raises x 10 x 2, with UE support Forward step ups, 6" box, x 10 x 2, L LE leading up Tandem stance, firm surface, eyes open, x 30" x 2 on each leg  07/29/22 Bike for improve motion full revolution 14 Heel raise x 10 Squat x 10 Knee flexion x 10  Lunging onto 12" step Terminal extension standing x 10  Step up onto 4" step x 10 Sitting Long arc quad 3# x 10  Sit to stand  Heel slide x 10  Supine:   Quad set x 10 Manual to decrease edema and pain.   07/27/22 Quad sets 10 x 10 second holds SLR 1 x 10  Heel slides 1 x 10 with 10 second holds LAQ 10 x 5 second holds  Ankle pumps x 20    PATIENT EDUCATION:  Education details: Updated HEP Person educated: Patient Education method: Explanation, Demonstration, and Handouts Education comprehension: verbalized understanding, returned demonstration, verbal cues required, and tactile cues required  HOME EXERCISE PROGRAM: Access Code: [email or web address] URL: [email or web address]  08/02/2022 - Seated Hamstring Stretch  - 1 x daily - 7 x weekly  - 3 reps - 30 hold  3/15/:- Sit to Stand with Arm Swing  - 1 x daily - 7 x weekly -  1 sets - 10 reps - Heel Raises with Counter Support  - 1 x daily - 7 x weekly - 1 sets - 10 reps - 3-5" hold - Mini Squat  - 1 x daily - 7 x weekly - 1 sets - 10 reps - 3-5" hold  Date: 07/27/2022 - Long Sitting Quad Set with Towel Roll Under Heel  - 3-5 x daily - 7 x weekly - 10 reps - 10 second hold - Active Straight Leg Raise with Quad Set  - 3-5 x daily - 7 x weekly - 2 sets - 10 reps - Supine Heel Slide with Strap  - 3-5 x daily - 7 x weekly - 10 reps - 10 second hold - Seated Long Arc Quad  - 3-5 x daily - 7 x weekly - 10 reps - 5 second hold - Seated Ankle Pumps  - 3-5 x daily - 7 x weekly - 20 reps  ASSESSMENT:  CLINICAL IMPRESSION:  Interventions today were geared towards balance, LE strengthening and flexibility. Tolerated all activities without worsening of symptoms. Mild difficulty with heel/toe raises and step ups due to impaired proprioception and weakness. Demonstrated appropriate levels of fatigue. Provided moderate amount of cueing to ensure correct execution of activity with fair to  good carry-over. To date, skilled PT is required to address the impairments and improve function.   OBJECTIVE IMPAIRMENTS: Abnormal gait, decreased activity tolerance, decreased endurance, decreased mobility, difficulty walking, decreased ROM, decreased strength, increased edema, increased muscle spasms, impaired flexibility, improper body mechanics, and pain.   ACTIVITY LIMITATIONS: carrying, lifting, bending, standing, squatting, sleeping, stairs, transfers, hygiene/grooming, locomotion level, and caring for others  PARTICIPATION LIMITATIONS: meal prep, cleaning, laundry, shopping, community activity, and yard work  PERSONAL FACTORS: Age and 3+ comorbidities: Hx R TKA 2014, hx cancer, DM, HTN, HLD  are also affecting patient's functional outcome.   REHAB POTENTIAL: Good  CLINICAL DECISION MAKING:  Stable/uncomplicated  EVALUATION COMPLEXITY: Low   GOALS: Goals reviewed with patient? Yes  SHORT TERM GOALS: Target date: 08/17/2022    Patient will be independent with HEP in order to improve functional outcomes. Baseline: Goal status: IN PROGRESS  2.  Patient will report at least 25% improvement in symptoms for improved quality of life. Baseline: Goal status: IN PROGRESS    LONG TERM GOALS: Target date: 09/07/2022    Patient will report at least 75% improvement in symptoms for improved quality of life. Baseline:  Goal status: IN PROGRESS  2.  Patient will improve FOTO score to predicted outcomes in order to indicate improved tolerance to activity. Baseline: 42% function Goal status: IN PROGRESS  3.  Patient will be able to navigate stairs with reciprocal pattern without compensation in order to demonstrate improved LE strength. Baseline:  7 inch step too pattern, with frequent cueing able to complete with alternating pattern; heavy UE support, decreased strength and motor control  Goal status: IN PROGRESS  4.  Patient will improve ROM for L knee extension/flexion to 0-120 degrees to improve squatting, and other functional mobility. Baseline: lacking 3 to 105 Goal status: IN PROGRESS  5.  Patient will be able to complete 5x STS in under 11.4 seconds in order to reduce the risk of falls. Baseline: 13.45 seconds without UE support Goal status: IN PROGRESS  6.  Patient will demonstrate grade of 5/5 MMT grade in all tested musculature as evidence of improved strength to assist with stair ambulation and gait.   Baseline: see above Goal status: IN PROGRESS  PLAN:  PT FREQUENCY: 2x/week  PT DURATION: 6 weeks  PLANNED INTERVENTIONS: Therapeutic exercises, Therapeutic activity, Neuromuscular re-education, Balance training, Gait training, Patient/Family education, Joint manipulation, Joint mobilization, Stair training, Orthotic/Fit training, DME instructions, Aquatic  Therapy, Dry Needling, Electrical stimulation, Spinal manipulation, Spinal mobilization, Cryotherapy, Moist heat, Compression bandaging, scar mobilization, Splintting, Taping, Traction, Ultrasound, Ionotophoresis 4mg /ml Dexamethasone, and Manual therapy  PLAN FOR NEXT SESSION: L knee mobility and strength and progress as able  Chrissie Noa L. Evianna Chandran, PT, DPT, OCS Board-Certified Clinical Specialist in [place] # ([place]): [id number] T 10:30am

## 2022-08-03 ENCOUNTER — Ambulatory Visit (HOSPITAL_COMMUNITY): Payer: Medicare Other

## 2022-08-04 ENCOUNTER — Ambulatory Visit (HOSPITAL_COMMUNITY): Payer: Medicare Other

## 2022-08-04 DIAGNOSIS — R2689 Other abnormalities of gait and mobility: Secondary | ICD-10-CM

## 2022-08-04 DIAGNOSIS — M6281 Muscle weakness (generalized): Secondary | ICD-10-CM

## 2022-08-04 DIAGNOSIS — R29898 Other symptoms and signs involving the musculoskeletal system: Secondary | ICD-10-CM

## 2022-08-04 DIAGNOSIS — M25562 Pain in left knee: Secondary | ICD-10-CM

## 2022-08-04 NOTE — Therapy (Signed)
OUTPATIENT PHYSICAL THERAPY LOWER EXTREMITY Treatment   Patient Name: Cody Palmer MRN: [id number] DOB:[birth year], 89 y.o., male Today's Date: 08/04/2022  END OF SESSION:  PT End of Session - 08/04/22 1147     Visit Number 4    Number of Visits 12    Date for PT Re-Evaluation 09/07/22    Authorization Type BCBS Medicare    PT Start Time 1120    PT Stop Time 1200    PT Time Calculation (min) 40 min    Activity Tolerance Patient tolerated treatment well    Behavior During Therapy WFL for tasks assessed/performed            Past Medical History:  Diagnosis Date   Arthritis    Cancer ([place])    Skin cancer   Diabetes mellitus without complication ([place])    HTN (hypertension)    Hypercholesteremia    Past Surgical History:  Procedure Laterality Date   CATARACT EXTRACTION W/PHACO Left 04/25/2016   Procedure: CATARACT EXTRACTION PHACO AND INTRAOCULAR LENS PLACEMENT LEFT EYE;  Surgeon: Tonny Branch, MD;  Location: [facility];  Service: Ophthalmology;  Laterality: Left;  CDE: 10.37   CYSTOSCOPY WITH BIOPSY N/A 08/13/2013   Procedure: CYSTOSCOPY WITH BLADDER BIOPSY;  Surgeon: Franchot Gallo, MD;  Location: [facility];  Service: Urology;  Laterality: N/A;   ENUCLEATION Right    Removed in [place]   removal of eye   TOTAL KNEE ARTHROPLASTY Right 01/11/2013   Procedure: RIGHT TOTAL KNEE ARTHROPLASTY;  Surgeon: Carole Civil, MD;  Location: [facility];  Service: Orthopedics;  Laterality: Right;   TOTAL KNEE ARTHROPLASTY Left 07/12/2022   Procedure: TOTAL KNEE ARTHROPLASTY;  Surgeon: Carole Civil, MD;  Location: [facility];  Service: Orthopedics;  Laterality: Left;   Patient Active Problem List   Diagnosis Date Noted   Arthritis of left knee 07/12/2022   Osteoarthritis of left knee 07/12/2022   Osteoarthrosis, unspecified whether generalized or localized, lower leg 12/31/2013   S/P total knee replacement 02/21/2013   Stiffness of right knee 01/28/2013   Weakness  of right leg 01/28/2013   Difficulty in walking(719.7) 01/28/2013   Arthritis of knee, degenerative 12/13/2012    PCP: [facility]  REFERRING PROVIDER: Carole Civil, MD  REFERRING DIAG: [id number] (ICD-10-CM) - Primary osteoarthritis of left knee  THERAPY DIAG:  Left knee pain, unspecified chronicity  Muscle weakness (generalized)  Other abnormalities of gait and mobility  Other symptoms and signs involving the musculoskeletal system  Rationale for Evaluation and Treatment: Rehabilitation  ONSET DATE: DOS: 07/12/22  SUBJECTIVE:   SUBJECTIVE STATEMENT: Patient has knee pain = 2/10. States that his knee got stiff sitting in the waiting room  PERTINENT HISTORY: Hx R TKA 2014, hx cancer, DM, HTN, HLD PAIN:  Are you having pain? Yes: NPRS scale: 3-4/10 Pain location: L knee Pain description: sore Aggravating factors: bending  Relieving factors: meds, ice  PRECAUTIONS: None  WEIGHT BEARING RESTRICTIONS: No  FALLS:  Has patient fallen in last 6 months? No  OCCUPATION: Retired  PLOF: Independent  PATIENT GOALS: get it working again, get back to golfing  NEXT MD VISIT: 2 weeks  OBJECTIVE:    PATIENT SURVEYS: FOTO 42% function  COGNITION: Overall cognitive status: Within functional limits for tasks assessed     SENSATION: WFL   POSTURE: No Significant postural limitations  PALPATION: Grossly tender L knee  LOWER EXTREMITY ROM:  Active ROM Right eval Left eval  Hip flexion  Hip extension    Hip abduction    Hip adduction    Hip internal rotation    Hip external rotation    Knee flexion 125 105  Knee extension 0 Lacking 3  Ankle dorsiflexion    Ankle plantarflexion    Ankle inversion    Ankle eversion     (Blank rows = not tested)  LOWER EXTREMITY MMT:  MMT Right eval Left eval  Hip flexion 5 4  Hip extension    Hip abduction    Hip adduction    Hip internal rotation    Hip external rotation    Knee flexion 5 4+  Knee  extension 5 4-  Ankle dorsiflexion 5 5  Ankle plantarflexion    Ankle inversion    Ankle eversion     (Blank rows = not tested)   FUNCTIONAL TESTS:  5 times sit to stand: 13.45 seconds 2 minute walk test: 350 feet with spc Stairs: 7 inch step too pattern, with frequent cueing able to complete with alternating pattern; heavy UE support, decreased strength and motor control   GAIT: Distance walked: 350 feet Assistive device utilized: Single point cane Level of assistance: Modified independence Comments: 2MWT, limited L knee flexion/extension   TODAY'S TREATMENT:                                                                                                                              DATE:  08/04/22 Bike for improve motion full revolution 14 x 5' Seated hamstring stretch x 30" x 3 Knee drives onto 12" step x 2' Standing TKE, GTB x 3" x 10 x 2 Gastrocnemius slant board stretch x 30" x 3 Sit-to-stand x 10 x 2, no UE support Heel/toe raises x 20, with UE support Forward step ups, 6" box, x 10 x 2, L LE leading up Tandem stance, firm surface, eyes open, x 30" x 2 on each leg Wobble board with UE support, front/back x 10   08/02/22 Bike for improve motion full revolution 14 x 5' Seated hamstring stretch x 30" x 3 Knee drives onto 12" step x 2' Standing TKE, RTB x 3" x 10 x 2 Gastrocnemius slant board stretch x 30" x 3 Sit-to-stand x 10 x 2, no UE support Heel/toe raises x 10 x 2, with UE support Forward step ups, 6" box, x 10 x 2, L LE leading up Tandem stance, firm surface, eyes open, x 30" x 2 on each leg  07/29/22 Bike for improve motion full revolution 14 Heel raise x 10 Squat x 10 Knee flexion x 10  Lunging onto 12" step Terminal extension standing x 10  Step up onto 4" step x 10 Sitting Long arc quad 3# x 10  Sit to stand  Heel slide x 10  Supine:   Quad set x 10 Manual to decrease edema and pain.   07/27/22 Quad sets 10 x 10 second holds SLR 1 x 10  Heel  slides 1 x 10 with 10 second holds LAQ 10 x 5 second holds  Ankle pumps x 20    PATIENT EDUCATION:  Education details: Updated HEP Person educated: Patient Education method: Explanation, Demonstration, and Handouts Education comprehension: verbalized understanding, returned demonstration, verbal cues required, and tactile cues required  HOME EXERCISE PROGRAM: Access Code: [email or web address] URL: [email or web address]  08/02/2022 - Seated Hamstring Stretch  - 1 x daily - 7 x weekly - 3 reps - 30 hold  3/15/:- Sit to Stand with Arm Swing  - 1 x daily - 7 x weekly - 1 sets - 10 reps - Heel Raises with Counter Support  - 1 x daily - 7 x weekly - 1 sets - 10 reps - 3-5" hold - Mini Squat  - 1 x daily - 7 x weekly - 1 sets - 10 reps - 3-5" hold  Date: 07/27/2022 - Long Sitting Quad Set with Towel Roll Under Heel  - 3-5 x daily - 7 x weekly - 10 reps - 10 second hold - Active Straight Leg Raise with Quad Set  - 3-5 x daily - 7 x weekly - 2 sets - 10 reps - Supine Heel Slide with Strap  - 3-5 x daily - 7 x weekly - 10 reps - 10 second hold - Seated Long Arc Quad  - 3-5 x daily - 7 x weekly - 10 reps - 5 second hold - Seated Ankle Pumps  - 3-5 x daily - 7 x weekly - 20 reps  ASSESSMENT:  CLINICAL IMPRESSION: Interventions today were geared towards balance, LE strengthening and flexibility. Tolerated all activities without worsening of symptoms. Patient has little to no difficulty with heel/toe raises but with mild difficulty with step ups due weakness and impaired proprioception. Demonstrated appropriate levels of fatigue. Provided moderate amount of cueing to ensure correct execution of activity with fair to  good carry-over. To date, skilled PT is required to address the impairments and improve function.   OBJECTIVE IMPAIRMENTS: Abnormal gait, decreased activity tolerance, decreased endurance, decreased mobility, difficulty walking, decreased ROM, decreased strength, increased edema,  increased muscle spasms, impaired flexibility, improper body mechanics, and pain.   ACTIVITY LIMITATIONS: carrying, lifting, bending, standing, squatting, sleeping, stairs, transfers, hygiene/grooming, locomotion level, and caring for others  PARTICIPATION LIMITATIONS: meal prep, cleaning, laundry, shopping, community activity, and yard work  PERSONAL FACTORS: Age and 3+ comorbidities: Hx R TKA 2014, hx cancer, DM, HTN, HLD  are also affecting patient's functional outcome.   REHAB POTENTIAL: Good  CLINICAL DECISION MAKING: Stable/uncomplicated  EVALUATION COMPLEXITY: Low   GOALS: Goals reviewed with patient? Yes  SHORT TERM GOALS: Target date: 08/17/2022    Patient will be independent with HEP in order to improve functional outcomes. Baseline: Goal status: IN PROGRESS  2.  Patient will report at least 25% improvement in symptoms for improved quality of life. Baseline: Goal status: IN PROGRESS    LONG TERM GOALS: Target date: 09/07/2022    Patient will report at least 75% improvement in symptoms for improved quality of life. Baseline:  Goal status: IN PROGRESS  2.  Patient will improve FOTO score to predicted outcomes in order to indicate improved tolerance to activity. Baseline: 42% function Goal status: IN PROGRESS  3.  Patient will be able to navigate stairs with reciprocal pattern without compensation in order to demonstrate improved LE strength. Baseline:  7 inch step too pattern, with frequent cueing able to complete with alternating pattern; heavy UE support, decreased strength and  motor control  Goal status: IN PROGRESS  4.  Patient will improve ROM for L knee extension/flexion to 0-120 degrees to improve squatting, and other functional mobility. Baseline: lacking 3 to 105 Goal status: IN PROGRESS  5.  Patient will be able to complete 5x STS in under 11.4 seconds in order to reduce the risk of falls. Baseline: 13.45 seconds without UE support Goal status: IN  PROGRESS  6.  Patient will demonstrate grade of 5/5 MMT grade in all tested musculature as evidence of improved strength to assist with stair ambulation and gait.   Baseline: see above Goal status: IN PROGRESS   PLAN:  PT FREQUENCY: 2x/week  PT DURATION: 6 weeks  PLANNED INTERVENTIONS: Therapeutic exercises, Therapeutic activity, Neuromuscular re-education, Balance training, Gait training, Patient/Family education, Joint manipulation, Joint mobilization, Stair training, Orthotic/Fit training, DME instructions, Aquatic Therapy, Dry Needling, Electrical stimulation, Spinal manipulation, Spinal mobilization, Cryotherapy, Moist heat, Compression bandaging, scar mobilization, Splintting, Taping, Traction, Ultrasound, Ionotophoresis 4mg /ml Dexamethasone, and Manual therapy  PLAN FOR NEXT SESSION: L knee mobility and strength and progress as able  Chrissie Noa L. Ahsan Esterline, PT, DPT, OCS Board-Certified Clinical Specialist in [place] # ([place]): [id number] T 12:00pm

## 2022-08-09 ENCOUNTER — Ambulatory Visit (HOSPITAL_COMMUNITY): Payer: Medicare Other | Admitting: Physical Therapy

## 2022-08-09 ENCOUNTER — Encounter (HOSPITAL_COMMUNITY): Payer: Self-pay | Admitting: Physical Therapy

## 2022-08-09 DIAGNOSIS — M25562 Pain in left knee: Secondary | ICD-10-CM | POA: Diagnosis not present

## 2022-08-09 DIAGNOSIS — M6281 Muscle weakness (generalized): Secondary | ICD-10-CM

## 2022-08-09 DIAGNOSIS — R2689 Other abnormalities of gait and mobility: Secondary | ICD-10-CM

## 2022-08-09 DIAGNOSIS — R29898 Other symptoms and signs involving the musculoskeletal system: Secondary | ICD-10-CM

## 2022-08-09 NOTE — Therapy (Signed)
OUTPATIENT PHYSICAL THERAPY LOWER EXTREMITY Treatment   Patient Name: Cody Palmer MRN: [id number] DOB:[birth year], 89 y.o., male Today's Date: 08/09/2022  END OF SESSION:  PT End of Session - 08/09/22 1349     Visit Number 5    Number of Visits 12    Date for PT Re-Evaluation 09/07/22    Authorization Type BCBS Medicare    PT Start Time [id number]    PT Stop Time 1428    PT Time Calculation (min) 39 min    Activity Tolerance Patient tolerated treatment well    Behavior During Therapy WFL for tasks assessed/performed            Past Medical History:  Diagnosis Date   Arthritis    Cancer ([place])    Skin cancer   Diabetes mellitus without complication ([place])    HTN (hypertension)    Hypercholesteremia    Past Surgical History:  Procedure Laterality Date   CATARACT EXTRACTION W/PHACO Left 04/25/2016   Procedure: CATARACT EXTRACTION PHACO AND INTRAOCULAR LENS PLACEMENT LEFT EYE;  Surgeon: Tonny Branch, MD;  Location: [facility];  Service: Ophthalmology;  Laterality: Left;  CDE: 10.37   CYSTOSCOPY WITH BIOPSY N/A 08/13/2013   Procedure: CYSTOSCOPY WITH BLADDER BIOPSY;  Surgeon: Franchot Gallo, MD;  Location: [facility];  Service: Urology;  Laterality: N/A;   ENUCLEATION Right    Removed in [place]   removal of eye   TOTAL KNEE ARTHROPLASTY Right 01/11/2013   Procedure: RIGHT TOTAL KNEE ARTHROPLASTY;  Surgeon: Carole Civil, MD;  Location: [facility];  Service: Orthopedics;  Laterality: Right;   TOTAL KNEE ARTHROPLASTY Left 07/12/2022   Procedure: TOTAL KNEE ARTHROPLASTY;  Surgeon: Carole Civil, MD;  Location: [facility];  Service: Orthopedics;  Laterality: Left;   Patient Active Problem List   Diagnosis Date Noted   Arthritis of left knee 07/12/2022   Osteoarthritis of left knee 07/12/2022   Osteoarthrosis, unspecified whether generalized or localized, lower leg 12/31/2013   S/P total knee replacement 02/21/2013   Stiffness of right knee 01/28/2013   Weakness  of right leg 01/28/2013   Difficulty in walking(719.7) 01/28/2013   Arthritis of knee, degenerative 12/13/2012    PCP: [facility]  REFERRING PROVIDER: Carole Civil, MD  REFERRING DIAG: [phone] (ICD-10-CM) - Primary osteoarthritis of left knee  THERAPY DIAG:  Left knee pain, unspecified chronicity  Muscle weakness (generalized)  Other abnormalities of gait and mobility  Other symptoms and signs involving the musculoskeletal system  Rationale for Evaluation and Treatment: Rehabilitation  ONSET DATE: DOS: 07/12/22  SUBJECTIVE:   SUBJECTIVE STATEMENT: Patient states his knee is doing alright.   PERTINENT HISTORY: Hx R TKA 2014, hx cancer, DM, HTN, HLD PAIN:  Are you having pain? Yes: NPRS scale: 2/10 Pain location: L knee Pain description: sore Aggravating factors: bending  Relieving factors: meds, ice  PRECAUTIONS: None  WEIGHT BEARING RESTRICTIONS: No  FALLS:  Has patient fallen in last 6 months? No  OCCUPATION: Retired  PLOF: Independent  PATIENT GOALS: get it working again, get back to golfing  NEXT MD VISIT: 2 weeks  OBJECTIVE:    PATIENT SURVEYS: FOTO 42% function  COGNITION: Overall cognitive status: Within functional limits for tasks assessed     SENSATION: WFL   POSTURE: No Significant postural limitations  PALPATION: Grossly tender L knee  LOWER EXTREMITY ROM:  Active ROM Right eval Left eval Left  08/09/22  Hip flexion     Hip extension  Hip abduction     Hip adduction     Hip internal rotation     Hip external rotation     Knee flexion 125 105 118  Knee extension 0 Lacking 3 Lacking 2  Ankle dorsiflexion     Ankle plantarflexion     Ankle inversion     Ankle eversion      (Blank rows = not tested)  LOWER EXTREMITY MMT:  MMT Right eval Left eval  Hip flexion 5 4  Hip extension    Hip abduction    Hip adduction    Hip internal rotation    Hip external rotation    Knee flexion 5 4+  Knee extension 5  4-  Ankle dorsiflexion 5 5  Ankle plantarflexion    Ankle inversion    Ankle eversion     (Blank rows = not tested)   FUNCTIONAL TESTS:  5 times sit to stand: 13.45 seconds 2 minute walk test: 350 feet with spc Stairs: 7 inch step too pattern, with frequent cueing able to complete with alternating pattern; heavy UE support, decreased strength and motor control   GAIT: Distance walked: 350 feet Assistive device utilized: Single point cane Level of assistance: Modified independence Comments: 2MWT, limited L knee flexion/extension   TODAY'S TREATMENT:                                                                                                                              DATE:  08/09/22 SLR 2 x 10 Heel slides with strap 2 x 10 with 5 second holds  Calf stretch on slant board 3 x 30 second holds HR 1 x 20 TR 1 x 20 Step up 6 inch 3 x 10  Lateral step up 6 inch 2 x 10  TKE black TB 1 x 10 with 10 second holds STS 2 x 10    08/04/22 Bike for improve motion full revolution 14 x 5' Seated hamstring stretch x 30" x 3 Knee drives onto 12" step x 2' Standing TKE, GTB x 3" x 10 x 2 Gastrocnemius slant board stretch x 30" x 3 Sit-to-stand x 10 x 2, no UE support Heel/toe raises x 20, with UE support Forward step ups, 6" box, x 10 x 2, L LE leading up Tandem stance, firm surface, eyes open, x 30" x 2 on each leg Wobble board with UE support, front/back x 10   08/02/22 Bike for improve motion full revolution 14 x 5' Seated hamstring stretch x 30" x 3 Knee drives onto 12" step x 2' Standing TKE, RTB x 3" x 10 x 2 Gastrocnemius slant board stretch x 30" x 3 Sit-to-stand x 10 x 2, no UE support Heel/toe raises x 10 x 2, with UE support Forward step ups, 6" box, x 10 x 2, L LE leading up Tandem stance, firm surface, eyes open, x 30" x 2 on each leg  07/29/22 Bike for improve motion full  revolution 14 Heel raise x 10 Squat x 10 Knee flexion x 10  Lunging onto 12"  step Terminal extension standing x 10  Step up onto 4" step x 10 Sitting Long arc quad 3# x 10  Sit to stand  Heel slide x 10  Supine:   Quad set x 10 Manual to decrease edema and pain.   07/27/22 Quad sets 10 x 10 second holds SLR 1 x 10  Heel slides 1 x 10 with 10 second holds LAQ 10 x 5 second holds  Ankle pumps x 20    PATIENT EDUCATION:  Education details: Updated HEP Person educated: Patient Education method: Explanation, Demonstration, and Handouts Education comprehension: verbalized understanding, returned demonstration, verbal cues required, and tactile cues required  HOME EXERCISE PROGRAM: Access Code: [email or web address] URL: [email or web address]  08/02/2022 - Seated Hamstring Stretch  - 1 x daily - 7 x weekly - 3 reps - 30 hold  3/15/:- Sit to Stand with Arm Swing  - 1 x daily - 7 x weekly - 1 sets - 10 reps - Heel Raises with Counter Support  - 1 x daily - 7 x weekly - 1 sets - 10 reps - 3-5" hold - Mini Squat  - 1 x daily - 7 x weekly - 1 sets - 10 reps - 3-5" hold  Date: 07/27/2022 - Long Sitting Quad Set with Towel Roll Under Heel  - 3-5 x daily - 7 x weekly - 10 reps - 10 second hold - Active Straight Leg Raise with Quad Set  - 3-5 x daily - 7 x weekly - 2 sets - 10 reps - Supine Heel Slide with Strap  - 3-5 x daily - 7 x weekly - 10 reps - 10 second hold - Seated Long Arc Quad  - 3-5 x daily - 7 x weekly - 10 reps - 5 second hold - Seated Ankle Pumps  - 3-5 x daily - 7 x weekly - 20 reps  ASSESSMENT:  CLINICAL IMPRESSION: Patient demonstrates improving improving ROM from lacking 2 to 122. Frequent cueing given for step up mechanics due to tendency to utilize weight shifting with poor carry over. Some improvement with manual cueing. Moderate fatigue at end of session. Patient will continue to benefit from physical therapy in order to improve function and reduce impairment.   OBJECTIVE IMPAIRMENTS: Abnormal gait, decreased activity tolerance,  decreased endurance, decreased mobility, difficulty walking, decreased ROM, decreased strength, increased edema, increased muscle spasms, impaired flexibility, improper body mechanics, and pain.   ACTIVITY LIMITATIONS: carrying, lifting, bending, standing, squatting, sleeping, stairs, transfers, hygiene/grooming, locomotion level, and caring for others  PARTICIPATION LIMITATIONS: meal prep, cleaning, laundry, shopping, community activity, and yard work  PERSONAL FACTORS: Age and 3+ comorbidities: Hx R TKA 2014, hx cancer, DM, HTN, HLD  are also affecting patient's functional outcome.   REHAB POTENTIAL: Good  CLINICAL DECISION MAKING: Stable/uncomplicated  EVALUATION COMPLEXITY: Low   GOALS: Goals reviewed with patient? Yes  SHORT TERM GOALS: Target date: 08/17/2022    Patient will be independent with HEP in order to improve functional outcomes. Baseline: Goal status: IN PROGRESS  2.  Patient will report at least 25% improvement in symptoms for improved quality of life. Baseline: Goal status: IN PROGRESS    LONG TERM GOALS: Target date: 09/07/2022    Patient will report at least 75% improvement in symptoms for improved quality of life. Baseline:  Goal status: IN PROGRESS  2.  Patient will improve FOTO score to predicted outcomes in  order to indicate improved tolerance to activity. Baseline: 42% function Goal status: IN PROGRESS  3.  Patient will be able to navigate stairs with reciprocal pattern without compensation in order to demonstrate improved LE strength. Baseline:  7 inch step too pattern, with frequent cueing able to complete with alternating pattern; heavy UE support, decreased strength and motor control  Goal status: IN PROGRESS  4.  Patient will improve ROM for L knee extension/flexion to 0-120 degrees to improve squatting, and other functional mobility. Baseline: lacking 3 to 105 Goal status: IN PROGRESS  5.  Patient will be able to complete 5x STS in under  11.4 seconds in order to reduce the risk of falls. Baseline: 13.45 seconds without UE support Goal status: IN PROGRESS  6.  Patient will demonstrate grade of 5/5 MMT grade in all tested musculature as evidence of improved strength to assist with stair ambulation and gait.   Baseline: see above Goal status: IN PROGRESS   PLAN:  PT FREQUENCY: 2x/week  PT DURATION: 6 weeks  PLANNED INTERVENTIONS: Therapeutic exercises, Therapeutic activity, Neuromuscular re-education, Balance training, Gait training, Patient/Family education, Joint manipulation, Joint mobilization, Stair training, Orthotic/Fit training, DME instructions, Aquatic Therapy, Dry Needling, Electrical stimulation, Spinal manipulation, Spinal mobilization, Cryotherapy, Moist heat, Compression bandaging, scar mobilization, Splintting, Taping, Traction, Ultrasound, Ionotophoresis 4mg /ml Dexamethasone, and Manual therapy  PLAN FOR NEXT SESSION: L knee mobility and strength and progress as able  2:25 PM, 08/09/22 Mearl Latin PT, DPT Physical Therapist at [facility]

## 2022-08-11 ENCOUNTER — Ambulatory Visit (HOSPITAL_COMMUNITY): Payer: Medicare Other | Admitting: Physical Therapy

## 2022-08-11 DIAGNOSIS — R29898 Other symptoms and signs involving the musculoskeletal system: Secondary | ICD-10-CM

## 2022-08-11 DIAGNOSIS — M25562 Pain in left knee: Secondary | ICD-10-CM

## 2022-08-11 DIAGNOSIS — R2689 Other abnormalities of gait and mobility: Secondary | ICD-10-CM

## 2022-08-11 DIAGNOSIS — M6281 Muscle weakness (generalized): Secondary | ICD-10-CM

## 2022-08-11 NOTE — Therapy (Signed)
OUTPATIENT PHYSICAL THERAPY LOWER EXTREMITY Treatment   Patient Name: Cody Palmer MRN: [id number] DOB:1933-03-05, 89 y.o., male Today's Date: 08/11/2022  END OF SESSION:  PT End of Session - 08/11/22 1519     Visit Number 6    Number of Visits 12    Date for PT Re-Evaluation 09/07/22    Authorization Type BCBS Medicare    PT Start Time 1438    PT Stop Time 1518   PT Time Calculation (min) 40 min    Activity Tolerance Patient tolerated treatment well    Behavior During Therapy WFL for tasks assessed/performed            Past Medical History:  Diagnosis Date   Arthritis    Cancer ([place])    Skin cancer   Diabetes mellitus without complication ([place])    HTN (hypertension)    Hypercholesteremia    Past Surgical History:  Procedure Laterality Date   CATARACT EXTRACTION W/PHACO Left 04/25/2016   Procedure: CATARACT EXTRACTION PHACO AND INTRAOCULAR LENS PLACEMENT LEFT EYE;  Surgeon: Tonny Branch, MD;  Location: [facility];  Service: Ophthalmology;  Laterality: Left;  CDE: 10.37   CYSTOSCOPY WITH BIOPSY N/A 08/13/2013   Procedure: CYSTOSCOPY WITH BLADDER BIOPSY;  Surgeon: Franchot Gallo, MD;  Location: [facility];  Service: Urology;  Laterality: N/A;   ENUCLEATION Right    Removed in [place]   removal of eye   TOTAL KNEE ARTHROPLASTY Right 01/11/2013   Procedure: RIGHT TOTAL KNEE ARTHROPLASTY;  Surgeon: Carole Civil, MD;  Location: [facility];  Service: Orthopedics;  Laterality: Right;   TOTAL KNEE ARTHROPLASTY Left 07/12/2022   Procedure: TOTAL KNEE ARTHROPLASTY;  Surgeon: Carole Civil, MD;  Location: [facility];  Service: Orthopedics;  Laterality: Left;   Patient Active Problem List   Diagnosis Date Noted   Arthritis of left knee 07/12/2022   Osteoarthritis of left knee 07/12/2022   Osteoarthrosis, unspecified whether generalized or localized, lower leg 12/31/2013   S/P total knee replacement 02/21/2013   Stiffness of right knee 01/28/2013   Weakness of  right leg 01/28/2013   Difficulty in walking(719.7) 01/28/2013   Arthritis of knee, degenerative 12/13/2012    PCP: [facility]  REFERRING PROVIDER: Carole Civil, MD  REFERRING DIAG: [phone] (ICD-10-CM) - Primary osteoarthritis of left knee  THERAPY DIAG:  Left knee pain, unspecified chronicity  Muscle weakness (generalized)  Other abnormalities of gait and mobility  Other symptoms and signs involving the musculoskeletal system  Rationale for Evaluation and Treatment: Rehabilitation  ONSET DATE: DOS: 07/12/22  SUBJECTIVE:   SUBJECTIVE STATEMENT: Patient states his knee is doing alright.   PERTINENT HISTORY: Hx R TKA 2014, hx cancer, DM, HTN, HLD PAIN:  Are you having pain? Yes: NPRS scale: 2/10 Pain location: L knee Pain description: sore Aggravating factors: bending  Relieving factors: meds, ice  PRECAUTIONS: None  WEIGHT BEARING RESTRICTIONS: No  FALLS:  Has patient fallen in last 6 months? No  OCCUPATION: Retired  PLOF: Independent  PATIENT GOALS: get it working again, get back to golfing  NEXT MD VISIT: 2 weeks  OBJECTIVE:    PATIENT SURVEYS: FOTO 42% function  COGNITION: Overall cognitive status: Within functional limits for tasks assessed     SENSATION: WFL   POSTURE: No Significant postural limitations  PALPATION: Grossly tender L knee  LOWER EXTREMITY ROM:  Active ROM Right eval Left eval Left  08/09/22  Hip flexion     Hip extension  Hip abduction     Hip adduction     Hip internal rotation     Hip external rotation     Knee flexion 125 105 118  Knee extension 0 Lacking 3 Lacking 2  Ankle dorsiflexion     Ankle plantarflexion     Ankle inversion     Ankle eversion      (Blank rows = not tested)  LOWER EXTREMITY MMT:  MMT Right eval Left eval  Hip flexion 5 4  Hip extension    Hip abduction    Hip adduction    Hip internal rotation    Hip external rotation    Knee flexion 5 4+  Knee extension 5 4-   Ankle dorsiflexion 5 5  Ankle plantarflexion    Ankle inversion    Ankle eversion     (Blank rows = not tested)   FUNCTIONAL TESTS:  5 times sit to stand: 13.45 seconds 2 minute walk test: 350 feet with spc Stairs: 7 inch step too pattern, with frequent cueing able to complete with alternating pattern; heavy UE support, decreased strength and motor control   GAIT: Distance walked: 350 feet Assistive device utilized: Single point cane Level of assistance: Modified independence Comments: 2MWT, limited L knee flexion/extension   TODAY'S TREATMENT:                                                                                                                              DATE:  08/11/22: Gait without assistive device.  Noted leaning to the LT  Heel raise x 10 Squat x 10  Lunging onto 1 " step  Terminal extension x 10 Lt  knee flexion x 10  Sit to stand x 10  Slant board stretch 30" x 3  Single leg stance x 5 B Sit to stand x 15  Step up onto 6" step x 10  Bike x 5 minutes to increase flexion. Supine: Quad set x 10  Bridge x 15 PROM for extension  Heelslide x 10 AROM :  2-122 08/09/22 SLR 2 x 10 Heel slides with strap 2 x 10 with 5 second holds  Calf stretch on slant board 3 x 30 second holds HR 1 x 20 TR 1 x 20 Step up 6 inch 3 x 10  Lateral step up 6 inch 2 x 10  TKE black TB 1 x 10 with 10 second holds STS 2 x 10    08/04/22 Bike for improve motion full revolution 14 x 5' Seated hamstring stretch x 30" x 3 Knee drives onto 12" step x 2' Standing TKE, GTB x 3" x 10 x 2 Gastrocnemius slant board stretch x 30" x 3 Sit-to-stand x 10 x 2, no UE support Heel/toe raises x 20, with UE support Forward step ups, 6" box, x 10 x 2, L LE leading up Tandem stance, firm surface, eyes open, x 30" x 2 on each leg Wobble board with UE  support, front/back x 10   08/02/22 Bike for improve motion full revolution 14 x 5' Seated hamstring stretch x 30" x 3 Knee drives onto  12" step x 2' Standing TKE, RTB x 3" x 10 x 2 Gastrocnemius slant board stretch x 30" x 3 Sit-to-stand x 10 x 2, no UE support Heel/toe raises x 10 x 2, with UE support Forward step ups, 6" box, x 10 x 2, L LE leading up Tandem stance, firm surface, eyes open, x 30" x 2 on each leg   PATIENT EDUCATION:  Education details: Updated HEP Person educated: Patient Education method: Explanation, Demonstration, and Handouts Education comprehension: verbalized understanding, returned demonstration, verbal cues required, and tactile cues required  HOME EXERCISE PROGRAM: Access Code: [email or web address] URL: [email or web address]  08/02/2022 - Seated Hamstring Stretch  - 1 x daily - 7 x weekly - 3 reps - 30 hold  3/15/:- Sit to Stand with Arm Swing  - 1 x daily - 7 x weekly - 1 sets - 10 reps - Heel Raises with Counter Support  - 1 x daily - 7 x weekly - 1 sets - 10 reps - 3-5" hold - Mini Squat  - 1 x daily - 7 x weekly - 1 sets - 10 reps - 3-5" hold  Date: 07/27/2022 - Long Sitting Quad Set with Towel Roll Under Heel  - 3-5 x daily - 7 x weekly - 10 reps - 10 second hold - Active Straight Leg Raise with Quad Set  - 3-5 x daily - 7 x weekly - 2 sets - 10 reps - Supine Heel Slide with Strap  - 3-5 x daily - 7 x weekly - 10 reps - 10 second hold - Seated Long Arc Quad  - 3-5 x daily - 7 x weekly - 10 reps - 5 second hold - Seated Ankle Pumps  - 3-5 x daily - 7 x weekly - 20 reps  ASSESSMENT:  CLINICAL IMPRESSION: Patient demonstrates improving improving ROM from lacking 2 to 122. Pt has difficulty with step up on 6" as well as demonstrating difficulty with balance B. Patient will continue to benefit from physical therapy in order to improve function and reduce impairment.   OBJECTIVE IMPAIRMENTS: Abnormal gait, decreased activity tolerance, decreased endurance, decreased mobility, difficulty walking, decreased ROM, decreased strength, increased edema, increased muscle spasms, impaired  flexibility, improper body mechanics, and pain.   ACTIVITY LIMITATIONS: carrying, lifting, bending, standing, squatting, sleeping, stairs, transfers, hygiene/grooming, locomotion level, and caring for others  PARTICIPATION LIMITATIONS: meal prep, cleaning, laundry, shopping, community activity, and yard work  PERSONAL FACTORS: Age and 3+ comorbidities: Hx R TKA 2014, hx cancer, DM, HTN, HLD  are also affecting patient's functional outcome.   REHAB POTENTIAL: Good  CLINICAL DECISION MAKING: Stable/uncomplicated  EVALUATION COMPLEXITY: Low   GOALS: Goals reviewed with patient? Yes  SHORT TERM GOALS: Target date: 08/17/2022    Patient will be independent with HEP in order to improve functional outcomes. Baseline: Goal status: IN PROGRESS  2.  Patient will report at least 25% improvement in symptoms for improved quality of life. Baseline: Goal status: IN PROGRESS    LONG TERM GOALS: Target date: 09/07/2022    Patient will report at least 75% improvement in symptoms for improved quality of life. Baseline:  Goal status: MET  2.  Patient will improve FOTO score to predicted outcomes in order to indicate improved tolerance to activity. Baseline: 42% function Goal status: IN PROGRESS  3.  Patient will be able  to navigate stairs with reciprocal pattern without compensation in order to demonstrate improved LE strength. Baseline:  7 inch step too pattern, with frequent cueing able to complete with alternating pattern; heavy UE support, decreased strength and motor control  Goal status: IN PROGRESS  4.  Patient will improve ROM for L knee extension/flexion to 0-120 degrees to improve squatting, and other functional mobility. Baseline: lacking 3 to 105 Goal status: MET  5.  Patient will be able to complete 5x STS in under 11.4 seconds in order to reduce the risk of falls. Baseline: 13.45 seconds without UE support Goal status: MET 3/28 10.98  6.  Patient will demonstrate grade of  5/5 MMT grade in all tested musculature as evidence of improved strength to assist with stair ambulation and gait.   Baseline: see above Goal status: IN PROGRESS   PLAN:  PT FREQUENCY: 2x/week  PT DURATION: 6 weeks  PLANNED INTERVENTIONS: Therapeutic exercises, Therapeutic activity, Neuromuscular re-education, Balance training, Gait training, Patient/Family education, Joint manipulation, Joint mobilization, Stair training, Orthotic/Fit training, DME instructions, Aquatic Therapy, Dry Needling, Electrical stimulation, Spinal manipulation, Spinal mobilization, Cryotherapy, Moist heat, Compression bandaging, scar mobilization, Splintting, Taping, Traction, Ultrasound, Ionotophoresis 4mg /ml Dexamethasone, and Manual therapy  PLAN FOR NEXT SESSION: begin stair climbing, gait without assistive device and balance.  Lt knee mobility and strength and progress as able.  Rayetta Humphrey PT/CLT [phone]

## 2022-08-15 ENCOUNTER — Ambulatory Visit (INDEPENDENT_AMBULATORY_CARE_PROVIDER_SITE_OTHER): Payer: Medicare Other | Admitting: Orthopedic Surgery

## 2022-08-15 ENCOUNTER — Encounter: Payer: Self-pay | Admitting: Orthopedic Surgery

## 2022-08-15 DIAGNOSIS — Z96652 Presence of left artificial knee joint: Secondary | ICD-10-CM

## 2022-08-15 NOTE — Progress Notes (Signed)
Chief Complaint  Patient presents with   Post-op Follow-up    07/12/22 left total knee states improving    Encounter Diagnosis  Name Primary?   Status post total left knee replacement (07/12/2022) Yes    5 weeks post op left tka   Cody Palmer is doing well but his knee is swollen he cannot actively get full extension but passively he can his flexion is excellent is 115 degrees he is walking without his cane today  Recommend continue home exercises to improve extension he can stop his formal PT and follow-up in 6 weeks

## 2022-08-16 ENCOUNTER — Encounter (HOSPITAL_COMMUNITY): Payer: Medicare Other | Admitting: Physical Therapy

## 2022-08-18 ENCOUNTER — Encounter (HOSPITAL_COMMUNITY): Payer: Medicare Other

## 2022-08-23 ENCOUNTER — Encounter (HOSPITAL_COMMUNITY): Payer: Medicare Other | Admitting: Physical Therapy

## 2022-08-25 ENCOUNTER — Encounter (HOSPITAL_COMMUNITY): Payer: Medicare Other | Admitting: Physical Therapy

## 2022-08-30 ENCOUNTER — Encounter (HOSPITAL_COMMUNITY): Payer: Medicare Other | Admitting: Physical Therapy

## 2022-09-01 ENCOUNTER — Encounter (HOSPITAL_COMMUNITY): Payer: Medicare Other | Admitting: Physical Therapy

## 2022-09-26 ENCOUNTER — Ambulatory Visit (INDEPENDENT_AMBULATORY_CARE_PROVIDER_SITE_OTHER): Payer: Medicare Other | Admitting: Orthopedic Surgery

## 2022-09-26 ENCOUNTER — Encounter: Payer: Self-pay | Admitting: Orthopedic Surgery

## 2022-09-26 DIAGNOSIS — Z96652 Presence of left artificial knee joint: Secondary | ICD-10-CM

## 2022-09-26 MED ORDER — CELECOXIB 200 MG PO CAPS: 200.0000 mg | ORAL_CAPSULE | Freq: Two times a day (BID) | ORAL | 5 refills | Status: DC

## 2022-09-26 NOTE — Progress Notes (Signed)
Chief Complaint  Patient presents with   Post-op Follow-up    07/12/22 LEFT TKR States knee still sore but getting around ok    Postop status post left total knee severe deformity used a augmented implant  Range of motion passive 0-1 25 active 5-1 20  Pain level 0-1  Complains of soreness  No erythema to the joint walking independently  His knee is a little warm his incision looks clean  The knee is stable extension and flexion  Recommend Celebrex twice a day follow-up in 3 months no signs of infection but signs of inflammation

## 2022-12-29 ENCOUNTER — Ambulatory Visit: Payer: Medicare Other | Admitting: Orthopedic Surgery

## 2022-12-29 ENCOUNTER — Encounter: Payer: Self-pay | Admitting: Orthopedic Surgery

## 2022-12-29 VITALS — BP 142/69 | HR 80 | Ht 69.0 in | Wt 207.0 lb

## 2022-12-29 DIAGNOSIS — Z96651 Presence of right artificial knee joint: Secondary | ICD-10-CM

## 2022-12-29 DIAGNOSIS — Z96652 Presence of left artificial knee joint: Secondary | ICD-10-CM | POA: Diagnosis not present

## 2022-12-29 DIAGNOSIS — M1712 Unilateral primary osteoarthritis, left knee: Secondary | ICD-10-CM

## 2022-12-29 NOTE — Progress Notes (Signed)
Cody Palmer comes in for a follow-up visit after his successful left total knee arthroplasty  Encounter Diagnoses  Name Primary?   Status post total left knee replacement 07/12/2022 Yes   History of total knee arthroplasty, right - 2014    Primary osteoarthritis of left knee      He is gone back to golf and he is doing well with no pain  Examination range of motion Extension 2 flexion 128 Very stable ambulates without assistive device  Assessment and plan 89 year old male doing very well after his left total knee.  Plan return in 6 months for x-rays of both knees

## 2023-06-28 NOTE — Progress Notes (Unsigned)
   There were no vitals taken for this visit.  There is no height or weight on file to calculate BMI.  No chief complaint on file.   Encounter Diagnosis  Name Primary?   Status post total left knee replacement 07/12/2022 Yes    DOI/DOS/ Date: 06/22/22  {CHL AMB ORT SYMPTOMS POST TREATMENT:21798}

## 2023-06-30 ENCOUNTER — Other Ambulatory Visit (INDEPENDENT_AMBULATORY_CARE_PROVIDER_SITE_OTHER): Payer: Medicare Other

## 2023-06-30 ENCOUNTER — Ambulatory Visit: Payer: Medicare Other | Admitting: Orthopedic Surgery

## 2023-06-30 DIAGNOSIS — Z96652 Presence of left artificial knee joint: Secondary | ICD-10-CM | POA: Diagnosis not present

## 2023-06-30 DIAGNOSIS — M1712 Unilateral primary osteoarthritis, left knee: Secondary | ICD-10-CM

## 2023-06-30 NOTE — Progress Notes (Signed)
   Subjective:    Patient ID: Cody Palmer, male    DOB: 1932-07-19, 89 y.o.   MRN: [id number]  1 year postop left total knee  Implant attune fixed-bearing posterior stabilized  Diagnosis osteoarthritis  No complaints      Review of Systems  Constitutional:  Negative for chills and fever.  Neurological:  Negative for numbness.       Objective:   Physical Exam Vitals and nursing note reviewed.  Constitutional:      Appearance: Normal appearance.  HENT:     Head: Normocephalic and atraumatic.  Eyes:     General: No scleral icterus.       Right eye: No discharge.        Left eye: No discharge.     Extraocular Movements: Extraocular movements intact.     Conjunctiva/sclera: Conjunctivae normal.     Pupils: Pupils are equal, round, and reactive to light.  Cardiovascular:     Rate and Rhythm: Normal rate.     Pulses: Normal pulses.  Musculoskeletal:     Comments: Left knee full extension full flexion  No signs of infection, no effusion, incision normal  Stability test normal sagittal coronal plane  Flexion arc 125 degrees  Skin:    General: Skin is warm and dry.     Capillary Refill: Capillary refill takes less than 2 seconds.  Neurological:     General: No focal deficit present.     Mental Status: He is alert and oriented to person, place, and time.     Gait: Gait normal.  Psychiatric:        Mood and Affect: Mood normal.        Behavior: Behavior normal.        Thought Content: Thought content normal.        Judgment: Judgment normal.           Assessment & Plan:   Encounter Diagnoses  Name Primary?   Status post total left knee replacement 07/12/2022 Yes   Primary osteoarthritis of left knee     DG Knee AP/LAT W/[place] Left Result Date: 06/30/2023 Imaging left knee 1 year follow-up left total knee arthroplasty Attune fixed-bearing posterior stabilized knee Sizing looks good We see an augmentation for the severe varus deformity that was seen  preop Alignment looks good no signs of loosening 's normal appearance postop 1 year left total knee    Stable postop total knee  Return 1 year x-ray both knees

## 2024-03-24 ENCOUNTER — Emergency Department (HOSPITAL_COMMUNITY)

## 2024-03-24 ENCOUNTER — Other Ambulatory Visit: Payer: Self-pay

## 2024-03-24 ENCOUNTER — Encounter (HOSPITAL_COMMUNITY): Payer: Self-pay

## 2024-03-24 ENCOUNTER — Emergency Department (HOSPITAL_COMMUNITY): Admission: EM | Admit: 2024-03-24 | Discharge: 2024-03-24 | Disposition: A | Discharge status: Home / Self Care

## 2024-03-24 DIAGNOSIS — I129 Hypertensive chronic kidney disease with stage 1 through stage 4 chronic kidney disease, or unspecified chronic kidney disease: Secondary | ICD-10-CM | POA: Diagnosis not present

## 2024-03-24 DIAGNOSIS — E1122 Type 2 diabetes mellitus with diabetic chronic kidney disease: Secondary | ICD-10-CM | POA: Diagnosis not present

## 2024-03-24 DIAGNOSIS — Z7984 Long term (current) use of oral hypoglycemic drugs: Secondary | ICD-10-CM | POA: Diagnosis not present

## 2024-03-24 DIAGNOSIS — N189 Chronic kidney disease, unspecified: Secondary | ICD-10-CM | POA: Insufficient documentation

## 2024-03-24 DIAGNOSIS — R109 Unspecified abdominal pain: Secondary | ICD-10-CM | POA: Diagnosis present

## 2024-03-24 DIAGNOSIS — Z79899 Other long term (current) drug therapy: Secondary | ICD-10-CM | POA: Insufficient documentation

## 2024-03-24 DIAGNOSIS — Z7982 Long term (current) use of aspirin: Secondary | ICD-10-CM | POA: Insufficient documentation

## 2024-03-24 DIAGNOSIS — Z85828 Personal history of other malignant neoplasm of skin: Secondary | ICD-10-CM | POA: Insufficient documentation

## 2024-03-24 DIAGNOSIS — K529 Noninfective gastroenteritis and colitis, unspecified: Secondary | ICD-10-CM | POA: Diagnosis not present

## 2024-03-24 LAB — COMPREHENSIVE METABOLIC PANEL WITH GFR
ALT: 27 U/L (ref 0–44)
AST: 55 U/L — ABNORMAL HIGH (ref 15–41)
Albumin: 3.9 g/dL (ref 3.5–5.0)
Alkaline Phosphatase: 90 U/L (ref 38–126)
Anion gap: 17 — ABNORMAL HIGH (ref 5–15)
BUN: 22 mg/dL (ref 8–23)
CO2: 22 mmol/L (ref 22–32)
Calcium: 9.4 mg/dL (ref 8.9–10.3)
Chloride: 100 mmol/L (ref 98–111)
Creatinine, Ser: 1.09 mg/dL (ref 0.61–1.24)
GFR, Estimated: >60 mL/min (ref >60)
Glucose, Bld: 116 mg/dL — ABNORMAL HIGH (ref 70–99)
Potassium: 4.1 mmol/L (ref 3.5–5.1)
Sodium: 138 mmol/L (ref 135–145)
Total Bilirubin: 0.6 mg/dL (ref 0.0–1.2)
Total Protein: 6.5 g/dL (ref 6.5–8.1)

## 2024-03-24 LAB — CBC
HCT: 38.7 % — ABNORMAL LOW (ref 39.0–52.0)
Hemoglobin: 12.5 g/dL — ABNORMAL LOW (ref 13.0–17.0)
MCH: 26.8 pg (ref 26.0–34.0)
MCHC: 32.3 g/dL (ref 30.0–36.0)
MCV: 83 fL (ref 80.0–100.0)
Platelets: 126 K/uL — ABNORMAL LOW (ref 150–400)
RBC: 4.66 MIL/uL (ref 4.22–5.81)
RDW: 15.6 % — ABNORMAL HIGH (ref 11.5–15.5)
WBC: 17.2 K/uL — ABNORMAL HIGH (ref 4.0–10.5)
nRBC: 0.2 % (ref 0.0–0.2)

## 2024-03-24 LAB — LIPASE, BLOOD: Lipase: 18 U/L (ref 11–51)

## 2024-03-24 MED ORDER — ONDANSETRON HCL 4 MG/2ML IJ SOLN
4.0000 mg | Freq: Once | INTRAMUSCULAR | Status: AC
  Administered 2024-03-24: 4 mg via INTRAVENOUS
  Filled 2024-03-24: qty 2

## 2024-03-24 MED ORDER — IOHEXOL 300 MG/ML  SOLN
100.0000 mL | Freq: Once | INTRAMUSCULAR | Status: AC | PRN
  Administered 2024-03-24: 100 mL via INTRAVENOUS

## 2024-03-24 MED ORDER — AMOXICILLIN-POT CLAVULANATE 875-125 MG PO TABS
1.0000 | ORAL_TABLET | Freq: Once | ORAL | Status: AC
  Administered 2024-03-24: 1 via ORAL
  Filled 2024-03-24: qty 1

## 2024-03-24 MED ORDER — AMOXICILLIN-POT CLAVULANATE 875-125 MG PO TABS: 1.0000 | ORAL_TABLET | Freq: Two times a day (BID) | ORAL | 0 refills | Status: DC

## 2024-03-24 MED ORDER — IOHEXOL 300 MG/ML  SOLN: 100.0000 mL | Freq: Once | INTRAMUSCULAR | Status: DC | PRN

## 2024-03-24 NOTE — ED Provider Notes (Signed)
 [place] EMERGENCY DEPARTMENT AT [facility] Provider Note   CSN: [id number] Arrival date & time: 03/24/24  1049     Patient presents with: Constipation  HPI Cody Palmer is a 89 y.o. male with CKD, hypertension, type 2 diabetes, skin cancer, hyperlipidemia presenting for constipation.  Has been going on for about a week.  Also endorsing intermittent abdominal pain.  Endorses nausea but no vomiting.  Denies fever or urinary symptoms.  States he has been taking prune juice and MiraLAX  with minimal relief.  Denies history of abdominal surgeries.  Denies opioid use.  Denies recent antibiotic use.    Constipation      Prior to Admission medications   Medication Sig Start Date End Date Taking? Authorizing Provider  amoxicillin-clavulanate (AUGMENTIN) 875-125 MG tablet Take 1 tablet by mouth every 12 (twelve) hours. 03/24/24  Yes Shery Wauneka K, PA-C  amLODipine  (NORVASC ) 10 MG tablet Take 10 mg by mouth daily.    [provider]  aspirin  81 MG chewable tablet Chew 1 tablet (81 mg total) by mouth 2 (two) times daily. 07/13/22   Minerva Taft BRAVO, MD  celecoxib  (CELEBREX ) 200 MG capsule Take 1 capsule (200 mg total) by mouth 2 (two) times daily. 09/26/22   Minerva Taft BRAVO, MD  Cholecalciferol  (VITAMIN D3) 25 MCG (1000 UT) CAPS Take 1,000 Units by mouth daily.    [provider]  HYDROcodone -acetaminophen  (NORCO) 7.5-325 MG tablet Take 1-2 tablets by mouth every 6 (six) hours as needed.    [provider]  JARDIANCE  25 MG TABS tablet Take 12.5 mg by mouth daily. 12/09/20   [provider]  labetalol  (NORMODYNE ) 300 MG tablet Take 300 mg by mouth 2 (two) times daily. 11/20/20   [provider]  losartan -hydrochlorothiazide  (HYZAAR) 100-25 MG per tablet Take 1 tablet by mouth daily.    [provider]  metFORMIN  (GLUCOPHAGE ) 500 MG tablet Take 500 mg by mouth 2 (two) times daily with a meal.    [provider]   methocarbamol  (ROBAXIN ) 500 MG tablet take one tablet by mouth every 6 hours as needed for spasms    [provider]  rosuvastatin  (CRESTOR ) 10 MG tablet Take 10 mg by mouth every other day.    [provider]  vitamin C  (ASCORBIC ACID ) 500 MG tablet Take 500 mg by mouth daily.    [provider]  zinc  gluconate 50 MG tablet Take 50 mg by mouth daily.    [provider]    Allergies: Patient has no known allergies.    Review of Systems  Gastrointestinal:  Positive for constipation.    Updated Vital Signs BP (!) 109/56   Pulse 74   Temp 98.5 F (36.9 C) (Oral)   Resp 16   Ht 5' 9 (1.753 m)   Wt 95.3 kg   SpO2 94%   BMI 31.01 kg/m   Physical Exam Vitals and nursing note reviewed.  HENT:     Head: Normocephalic and atraumatic.     Mouth/Throat:     Mouth: Mucous membranes are moist.  Eyes:     General:        Right eye: No discharge.        Left eye: No discharge.     Conjunctiva/sclera: Conjunctivae normal.  Cardiovascular:     Rate and Rhythm: Normal rate and regular rhythm.     Pulses: Normal pulses.     Heart sounds: Normal heart sounds.  Pulmonary:  Effort: Pulmonary effort is normal.     Breath sounds: Normal breath sounds.  Abdominal:     General: Abdomen is flat.     Palpations: Abdomen is soft.     Tenderness: There is abdominal tenderness in the right upper quadrant and right lower quadrant.  Skin:    General: Skin is warm and dry.  Neurological:     General: No focal deficit present.  Psychiatric:        Mood and Affect: Mood normal.     (all labs ordered are listed, but only abnormal results are displayed) Labs Reviewed  COMPREHENSIVE METABOLIC PANEL WITH GFR - Abnormal; Notable for the following components:      Result Value   Glucose, Bld 116 (*)    AST 55 (*)    Anion gap 17 (*)    All other components within normal limits  CBC - Abnormal; Notable for the following components:   WBC 17.2 (*)     Hemoglobin 12.5 (*)    HCT 38.7 (*)    RDW 15.6 (*)    Platelets 126 (*)    All other components within normal limits  LIPASE, BLOOD  URINALYSIS, ROUTINE W REFLEX MICROSCOPIC    EKG: None  Radiology: CT ABDOMEN PELVIS W CONTRAST Result Date: 03/24/2024 EXAM: CT ABDOMEN AND PELVIS WITH CONTRAST 03/24/2024 12:34:57 PM TECHNIQUE: CT of the abdomen and pelvis was performed with the administration of 100 mL of iohexol  (OMNIPAQUE ) 300 MG/ML solution. Multiplanar reformatted images are provided for review. Automated exposure control, iterative reconstruction, and/or weight-based adjustment of the mA/kV was utilized to reduce the radiation dose to as low as reasonably achievable. COMPARISON: Comparison with 07/19/2013. CLINICAL HISTORY: Constipation. Laxative not providing relief. FINDINGS: LOWER CHEST: No acute abnormality. LIVER: The liver is unremarkable. GALLBLADDER AND BILE DUCTS: Mild gallbladder wall thickening is favored reactive. No biliary ductal dilatation. SPLEEN: No acute abnormality. PANCREAS: Fatty atrophy of the pancreas. There is relative sparing of the fat about the pancreas from the adjacent inflammatory process. ADRENAL GLANDS: No acute abnormality. KIDNEYS, URETERS AND BLADDER: No stones in the kidneys or ureters. No hydronephrosis. No perinephric or periureteral stranding. Large bladder diverticula about the right dome of the bladder. GI AND BOWEL: Wall thickening about the ascending and transverse colon with marked adjacent inflammatory stranding and fluid. Extensive diverticulosis about the descending and sigmoid colon. Mild stranding about the descending colon. Wall thickening about the gastric antrum, duodenum, and proximal jejunum with adjacent stranding and free fluid. Additional wall thickening and stranding with engorgement of the vasa recta about the ileum in the right lower quadrant. There is no bowel obstruction. PERITONEUM AND RETROPERITONEUM: No free air. No organized fluid  collection. VASCULATURE: Aorta is normal in caliber. Aortic atherosclerotic calcification. LYMPH NODES: No lymphadenopathy. REPRODUCTIVE ORGANS: No acute abnormality. BONES AND SOFT TISSUES: No acute osseous abnormality. No focal soft tissue abnormality. IMPRESSION: 1. Severe enterocolitis greatest about the duodenum, ileum, and right colon. No drainable abscess for free intraperitoneal air. Electronically signed by: Norman Gatlin MD 03/24/2024 01:18 PM EST RP Workstation: [id number]     Procedures   Medications Ordered in the ED  iohexol  (OMNIPAQUE ) 300 MG/ML solution 100 mL (has no administration in time range)  amoxicillin-clavulanate (AUGMENTIN) 875-125 MG per tablet 1 tablet (has no administration in time range)  ondansetron  (ZOFRAN ) injection 4 mg (4 mg Intravenous Given 03/24/24 1157)  iohexol  (OMNIPAQUE ) 300 MG/ML solution 100 mL (100 mLs Intravenous Contrast Given 03/24/24 1235)  Medical Decision Making Amount and/or Complexity of Data Reviewed Labs: ordered. Radiology: ordered.  Risk Prescription drug management.   Initial Impression and Ddx 89 year old well-appearing male presenting for abdominal pain and constipation.  Exam notable for right sided abdominal tenderness.  DDx includes bowel obstruction, malignancy, appendicitis, acute cholecystitis, kidney stone, other. Patient PMH that increases complexity of ED encounter:   CKD, hypertension, type 2 diabetes, skin cancer, hyperlipidemia  Interpretation of Diagnostics - I independent reviewed and interpreted the labs as followed: WBC 17.2  - I independently visualized the following imaging with scope of interpretation limited to determining acute life threatening conditions related to emergency care: CT, which revealed severe enterocolitis  Patient Reassessment and Ultimate Disposition/Management Workup revealing of severe enterocolitis.  Symptoms improved on reassessment.  Vitals have  been stable and workup does not suggest sepsis.  Started him on Augmentin.  Advised him to follow-up with gastroenterology.  Discussed return precautions.  Discharged good condition.  Patient management required discussion with the following services or consulting groups:  None  Complexity of Problems Addressed Acute complicated illness or Injury  Additional Data Reviewed and Analyzed Further history obtained from: Further history from spouse/family member, Past medical history and medications listed in the EMR, and Prior ED visit notes  Patient Encounter Risk Assessment Consideration of hospitalization      Final diagnoses:  Enterocolitis    ED Discharge Orders          Ordered    amoxicillin-clavulanate (AUGMENTIN) 875-125 MG tablet  Every 12 hours        03/24/24 1411               Isobella Ascher K, PA-C 03/24/24 1412    Kammerer, Megan L, DO 03/27/24 [phone]

## 2024-03-24 NOTE — Discharge Instructions (Addendum)
 Evaluation today revealed that you have enterocolitis which is inflammation and/or infection of the small intestines.  I am starting you on Augmentin for treatment.  Please follow-up with gastroenterology for further evaluation in the outpatient setting.  If you develop bloody stools, worsening abdominal pain, fever, nausea and vomiting or any other concerning symptom please return to the ED for further evaluation.

## 2024-03-24 NOTE — ED Triage Notes (Signed)
 Patient come in POV for complaint of Having constipation all week, stated it started a week ago of and on. Has tried prune juices and miralax  with not relief.

## 2024-03-24 NOTE — ED Notes (Signed)
 Patient transported to CT

## 2024-03-27 ENCOUNTER — Ambulatory Visit: Admitting: Gastroenterology

## 2024-03-27 ENCOUNTER — Ambulatory Visit (HOSPITAL_COMMUNITY)
Admission: RE | Admit: 2024-03-27 | Discharge: 2024-03-27 | Disposition: A | Discharge status: Home / Self Care | Source: Ambulatory Visit | Attending: Gastroenterology | Admitting: Gastroenterology

## 2024-03-27 ENCOUNTER — Encounter: Payer: Self-pay | Admitting: Gastroenterology

## 2024-03-27 VITALS — BP 105/81 | HR 81 | Temp 98.2°F | Ht 69.0 in | Wt 203.7 lb

## 2024-03-27 DIAGNOSIS — R1084 Generalized abdominal pain: Secondary | ICD-10-CM | POA: Insufficient documentation

## 2024-03-27 MED ORDER — PANTOPRAZOLE SODIUM 40 MG PO TBEC: 40.0000 mg | DELAYED_RELEASE_TABLET | Freq: Every day | ORAL | 3 refills | Status: DC

## 2024-03-27 NOTE — Progress Notes (Signed)
 Gastroenterology Office Note    Referring Provider: Roni, [facility] Primary Care Physician:  Cody [name], NP  Primary GI: DR Shaaron    Chief Complaint   Chief Complaint  Patient presents with   New Patient (Initial Visit)    Patient here today due to recent Ed Visit on 03/24/2024, due to abdominal pain, bloating and constipation. Patient has decreased appetite. Patient had increased WBC, and was diagnosed with Enterocolitis and was placed on Augmentin bid. Patient had labs and a ct scan done at the Ed visit and states the Ct scan showed no stool burden. Patient has had some fatigue and weakness. Last hgb was 12.5 on 03/24/2024.     History of Present Illness   Cody Palmer is an 89 y.o. male presenting today at the request of [facility] Palmer ED due to findings of severe enterocolitis on presentation to the ED at [facility] on 11/9.  He presented to the ED with concerns for abdominal pain for the past week, nausea, and feeling constipated.  In the emergency room, CT showed severe enteric colitis greatest about the duodenum, ileum, and the right colon.  No drainable abscess or free intraperitoneal air.  Labs were significant for leukocytosis with white blood cell count of 17.2, previously 12.5 a year prior.    Hemoglobin 12.5, platelets 126.Sodium 138, potassium 4.1.  BUN was 22, creatinine was 1.09.  AST was mildly elevated at 55.  ALT was 27.  T. bili normal at 0.6, alk phos 90 lipase is normal 18.  He was provided Augmentin at discharge and advised to follow-up closely with GI.  He is here with his daughter, Cody Palmer, who works at [employer].   Today: Notes he had acute onset of symptoms. No sick contacts. No rectal bleeding. No recent exposure to abx prior to this. Lost about 4-5 lbs over the past week or so.. For 2 weeks felt constipated. Drinking prune juice, heating it up. +flatus. Small amount of BM. Abdomen feels tight. Felt nauseated but no vomiting. Feels  full. Weak feeling. Neighbor has chickens.  He is normally very active.  He plays card games every Monday.  His daughter states he has a high tolerance for pain.  He is able to tolerate eating and drinking however his appetite is poor.  Noting early satiety.  Hey he.  He is urinating well and does not feel dehydrated.  His overall feeling is of more fatigued than normal and feeling constipated.  Yesterday:  Ate one egg, grits, trying to drink lots of water . Ate some yogurt. No postprandial   Today: yogurt, 2/3 water    No appetite. Early satiety.   Constipated in the past but not often, just a few incidences.   No fever/chills. No dysphagia. Will take omeprazole occasionally .    81 mg aspirin , no ibuprofen , advil , no aspirin  powders.   Slept well till 4am this morning. Not wakening with pain.    03/24/2024 CT abd/pelvis with contrastIMPRESSION: 1. Severe enterocolitis greatest about the duodenum, ileum, and right colon. No drainable abscess for free intraperitoneal air.  No FH colon cancer or polyps  Possible colonoscopy many many years ago.  Appears to be possibly around 2009.   Past Medical History:  Diagnosis Date   Arthritis    Cancer (HCC)    Skin cancer   Diabetes mellitus without complication (HCC)    HTN (hypertension)    Hypercholesteremia     Past Surgical History:  Procedure  Laterality Date   CATARACT EXTRACTION W/PHACO Left 04/25/2016   Procedure: CATARACT EXTRACTION PHACO AND INTRAOCULAR LENS PLACEMENT LEFT EYE;  Surgeon: Cherene Mania, MD;  Location: [facility];  Service: Ophthalmology;  Laterality: Left;  CDE: 10.37   CYSTOSCOPY WITH BIOPSY N/A 08/13/2013   Procedure: CYSTOSCOPY WITH BLADDER BIOPSY;  Surgeon: Garnette Shack, MD;  Location: [facility];  Service: Urology;  Laterality: N/A;   ENUCLEATION Right    Removed in 1958   EYE SURGERY  1958   removal of eye   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     amoxicillin-clavulanate (AUGMENTIN) 875-125 MG tablet Take 1 tablet by mouth every 12 (twelve) hours. 14 tablet 0   aspirin  81 MG chewable tablet Chew 1 tablet (81 mg total) by mouth 2 (two) times daily. (Patient taking differently: Chew 81 mg by mouth daily.) 70 tablet 0   Cholecalciferol  (VITAMIN D3) 25 MCG (1000 UT) CAPS Take 1,000 Units by mouth daily.     JARDIANCE  25 MG TABS tablet Take 12.5 mg by mouth daily.     labetalol  (NORMODYNE ) 300 MG tablet Take 300 mg by mouth 2 (two) times daily.     losartan -hydrochlorothiazide  (HYZAAR) 100-25 MG per tablet Take 1 tablet by mouth daily.     metFORMIN  (GLUCOPHAGE ) 500 MG tablet Take 500 mg by mouth 2 (two) times daily with a meal.     Multiple Vitamins-Minerals (EYE HEALTH AREDS 2 PO) Take by mouth 2 (two) times daily.     rosuvastatin  (CRESTOR ) 10 MG tablet Take 10 mg by mouth every other day.     No current facility-administered medications for this visit.    Allergies as of 03/27/2024   (No Known Allergies)    Family History  Problem Relation Age of Onset   Arthritis Unknown    Cancer Unknown    Diabetes Unknown     Social History   Socioeconomic History   Marital status: Widowed    Spouse name: Not on file   Number of children: Not on file   Years of education: Not on file   Highest education level: Not on file  Occupational History   Not on file  Tobacco Use   Smoking status: Former    Current packs/day: 0.00    Average packs/day: 1 pack/day for 8.0 years (8.0 ttl pk-yrs)    Types: Cigarettes    Start date: 01/07/1957    Quit  date: 01/07/1965    Years since quitting: 59.2   Smokeless tobacco: Never  Vaping Use   Vaping status: Never Used  Substance and Sexual Activity   Alcohol use: No   Drug use: No   Sexual activity: Yes    Birth control/protection: None  Other Topics Concern   Not on file  Social History Narrative   Not on file   Social Drivers of Health   Financial Resource Strain: Not on file  Food Insecurity: Not on file  Transportation Needs: Not on file  Physical Activity: Not on file  Stress: Not on file  Social Connections: Not  on file  Intimate Partner Violence: Not on file     Review of Systems   See HPI   Physical Exam   BP 105/81 (BP Location: Left Arm, Patient Position: Sitting, Cuff Size: Normal)   Pulse 81   Temp 98.2 F (36.8 C) (Temporal)   Ht 5' 9 (1.753 m)   Wt 203 lb 11.2 oz (92.4 kg)   BMI 30.08 kg/m  General:   Alert and oriented. Pleasant and cooperative.  Head:  Normocephalic and atraumatic. Eyes:  right eye prosthetic Ears:  Normal auditory acuity. Lungs:  Clear to auscultation bilaterally.  Heart:  S1, S2 present without murmurs appreciated.  Abdomen:  +BS, moderately distended, firm area to right of abdomen, unclear etiology, mild TTP diffusely, small umbilical hernia.  Rectal:  Deferred  Msk:  Symmetrical without gross deformities. Normal posture. Extremities:  Without edema. Neurologic:  Alert and  oriented x4;  grossly normal neurologically. Skin:  Intact without significant lesions or rashes. Psych:  Alert and cooperative. Normal mood and affect.   Assessment   DECODA VAN is an 89 y.o. male presenting today at the request of [facility] Palmer ED due to findings of severe enterocolitis on presentation to the ED at [facility] on 11/9.  He presented to the ED with concerns for abdominal pain for the past week, nausea, and feeling constipated. Cody Palmer, daughter, is present with him today.  Findings of enterocolitis of unclear etiology.  He is  completing a course of empiric antibiotics by the emergency room.  Suspect moreso inflammatory process instead of infectious as no diarrhea and actually more constipated; I am ordering updated labs to ensure improvement in leukocytosis and we will also order abdominal x-ray to rule out any ileus.  Discussed with patient and his daughter  presenting to the ED if he has any decline in symptoms over the weekend.  Clinically, he does not appear dehydrated at this time.  We may ultimately need to pursue endoscopic procedures but I am checking more labs before pursuing this.  We will start pantoprazole  once daily due to signs of gastric and duodenal wall thickening, and he will complete the course of antibiotics.  I am going to discuss the CT result with radiologist for further clarification and guidance.     PLAN   AAS:   Miralax  daily if AAS is normal  Labs: CBC, CMP  Complete course of empiric antibiotics.  Empiric PPI daily prescribed.  ED precautions discussed  Next step in evaluation will be determined after review of blood work.     Therisa MICAEL Stager, PhD, ANP-BC [facility]   ADDENDUM:  Abdominal x-ray completed on November 12 with gas and stool scattered in the nondilated colon.  No small bowel dilatation.  No acute findings.  He was advised to start taking MiraLAX  daily.  I reviewed CT with radiologist as well. Majority of inflammation is at the TI and small bowel, less colon involvement but still present. Some gastric and duodenal thickening. Left inguinal lymph node looks enlarged. No obvious concern for ischemic process on CT. Notable mesenteric stranding, raising concern for lymphoma process   Labs showed leukocytosis improved at 12.9, hemoglobin 12.7 and steady, platelets 126 similar to prior reading.  New onset thrombocytopenia has been noted.  Pathology was then further completed due to abnormal cells and showed atypical hematolymphoid cells suggestive of  malignancy, possible lymphoma cells.  I discussed with oncology briefly who recommended to check LDH and flow cytometry. We also  had planned on  checking CRP, sed rate, fecal calprotectin, and if any diarrhea will need stool studies.  We will go ahead and also start referral to hematology oncology due to concern for lymphoma.  Prior to being able to complete these labs and the referral, I received a call from patient's daughter this morning on November 20 that patient had declined over the past 24 to 48 hours and was not able to read [place] hydrated, had significantly decreased urinary output, was feeling more fatigued, and was noting abdominal discomfort.  I advised her to take him directly to the emergency room.  Therisa MICAEL Stager, PhD, ANP-BC [facility]

## 2024-03-27 NOTE — Patient Instructions (Signed)
 I have ordered labs and an xray to be done as soon as possible.  Follow a soft diet for now, and take 1/2 capful to 1 capful of miralax  once a day to keep stool soft and moving.  I am reviewing the CT with radiologist.  If you have any worsening symptoms, fever, chills, vomiting, worsening pain, blood in stool or emesis, or anything that just does not feel right, please go right to the emergency room.  We will see you in close follow-up, but in the meantime, we will be working this up!   It was a pleasure to see you today. I want to create trusting relationships with patients and provide genuine, compassionate, and quality care. I truly value your feedback, so please be on the lookout for a survey regarding your visit with me today. I appreciate your time in completing this!         Therisa MICAEL Stager, PhD, ANP-BC [facility]

## 2024-03-28 LAB — CBC WITH DIFFERENTIAL/PLATELET

## 2024-03-29 LAB — IMMATURE CELLS
Bands(Auto) Relative: 1 %
MYELOCYTES: 2 % — ABNORMAL HIGH (ref 0–0)
Metamyelocytes: 3 % — ABNORMAL HIGH (ref 0–0)
OTHER, LINEAGE UNCERTAIN: 13 % — ABNORMAL HIGH (ref 0–0)

## 2024-03-29 LAB — COMPREHENSIVE METABOLIC PANEL WITH GFR
ALT: 43 IU/L (ref 0–44)
AST: 62 IU/L — ABNORMAL HIGH (ref 0–40)
Albumin: 4 g/dL (ref 3.7–4.7)
Alkaline Phosphatase: 97 IU/L (ref 48–129)
BUN/Creatinine Ratio: 26 — ABNORMAL HIGH (ref 10–24)
BUN: 32 mg/dL — ABNORMAL HIGH (ref 8–27)
Bilirubin Total: 0.7 mg/dL (ref 0.0–1.2)
CO2: 21 mmol/L (ref 20–29)
Calcium: 10 mg/dL (ref 8.6–10.2)
Chloride: 97 mmol/L (ref 96–106)
Creatinine, Ser: 1.22 mg/dL (ref 0.76–1.27)
Globulin, Total: 2.4 g/dL (ref 1.5–4.5)
Glucose: 97 mg/dL (ref 70–99)
Potassium: 4.8 mmol/L (ref 3.5–5.2)
Sodium: 139 mmol/L (ref 134–144)
Total Protein: 6.4 g/dL (ref 6.0–8.5)
eGFR: 57 mL/min/1.73 — ABNORMAL LOW (ref >59)

## 2024-03-29 LAB — CBC WITH DIFFERENTIAL/PLATELET
Basophils Absolute: 0.1 x10E3/uL (ref 0.0–0.2)
Basos: 1 %
EOS (ABSOLUTE): 0.1 x10E3/uL (ref 0.0–0.4)
Eos: 1 %
Hematocrit: 38.9 % (ref 37.5–51.0)
Hemoglobin: 12.7 g/dL — AB (ref 13.0–17.7)
Lymphocytes Absolute: 3.2 x10E3/uL — AB (ref 0.7–3.1)
Lymphs: 25 %
MCH: 27.6 pg (ref 26.6–33.0)
MCHC: 32.6 g/dL (ref 31.5–35.7)
MCV: 85 fL (ref 79–97)
Monocytes Absolute: 0.9 x10E3/uL (ref 0.1–0.9)
Monocytes: 7 %
Neutrophils Absolute: 6.2 x10E3/uL (ref 1.4–7.0)
Neutrophils: 47 %
Platelets: 126 x10E3/uL — AB (ref 150–450)
RBC: 4.6 x10E6/uL (ref 4.14–5.80)
RDW: 14.9 % (ref 11.6–15.4)
WBC: 12.9 x10E3/uL — AB (ref 3.4–10.8)

## 2024-04-01 ENCOUNTER — Ambulatory Visit: Payer: Self-pay | Admitting: Gastroenterology

## 2024-04-04 ENCOUNTER — Emergency Department (HOSPITAL_COMMUNITY)

## 2024-04-04 ENCOUNTER — Other Ambulatory Visit: Payer: Self-pay

## 2024-04-04 ENCOUNTER — Observation Stay (HOSPITAL_COMMUNITY)
Admission: EM | Admit: 2024-04-04 | Discharge: 2024-04-06 | Disposition: A | Discharge status: Home / Self Care | Attending: Emergency Medicine | Admitting: Emergency Medicine

## 2024-04-04 ENCOUNTER — Encounter (HOSPITAL_COMMUNITY): Payer: Self-pay

## 2024-04-04 DIAGNOSIS — Z6830 Body mass index (BMI) 30.0-30.9, adult: Secondary | ICD-10-CM | POA: Insufficient documentation

## 2024-04-04 DIAGNOSIS — Z96651 Presence of right artificial knee joint: Secondary | ICD-10-CM | POA: Insufficient documentation

## 2024-04-04 DIAGNOSIS — R140 Abdominal distension (gaseous): Principal | ICD-10-CM | POA: Diagnosis present

## 2024-04-04 DIAGNOSIS — Z85828 Personal history of other malignant neoplasm of skin: Secondary | ICD-10-CM | POA: Insufficient documentation

## 2024-04-04 DIAGNOSIS — Z7982 Long term (current) use of aspirin: Secondary | ICD-10-CM | POA: Insufficient documentation

## 2024-04-04 DIAGNOSIS — D122 Benign neoplasm of ascending colon: Secondary | ICD-10-CM | POA: Insufficient documentation

## 2024-04-04 DIAGNOSIS — R188 Other ascites: Secondary | ICD-10-CM

## 2024-04-04 DIAGNOSIS — I10 Essential (primary) hypertension: Secondary | ICD-10-CM | POA: Insufficient documentation

## 2024-04-04 DIAGNOSIS — R634 Abnormal weight loss: Secondary | ICD-10-CM

## 2024-04-04 DIAGNOSIS — E119 Type 2 diabetes mellitus without complications: Secondary | ICD-10-CM | POA: Insufficient documentation

## 2024-04-04 DIAGNOSIS — R591 Generalized enlarged lymph nodes: Secondary | ICD-10-CM | POA: Diagnosis not present

## 2024-04-04 DIAGNOSIS — E66811 Obesity, class 1: Secondary | ICD-10-CM | POA: Insufficient documentation

## 2024-04-04 DIAGNOSIS — Z87891 Personal history of nicotine dependence: Secondary | ICD-10-CM | POA: Insufficient documentation

## 2024-04-04 DIAGNOSIS — Z7984 Long term (current) use of oral hypoglycemic drugs: Secondary | ICD-10-CM | POA: Insufficient documentation

## 2024-04-04 DIAGNOSIS — K5730 Diverticulosis of large intestine without perforation or abscess without bleeding: Secondary | ICD-10-CM | POA: Insufficient documentation

## 2024-04-04 DIAGNOSIS — K529 Noninfective gastroenteritis and colitis, unspecified: Secondary | ICD-10-CM

## 2024-04-04 DIAGNOSIS — R935 Abnormal findings on diagnostic imaging of other abdominal regions, including retroperitoneum: Secondary | ICD-10-CM

## 2024-04-04 DIAGNOSIS — Z79899 Other long term (current) drug therapy: Secondary | ICD-10-CM | POA: Insufficient documentation

## 2024-04-04 DIAGNOSIS — Z96652 Presence of left artificial knee joint: Secondary | ICD-10-CM | POA: Insufficient documentation

## 2024-04-04 DIAGNOSIS — R933 Abnormal findings on diagnostic imaging of other parts of digestive tract: Secondary | ICD-10-CM | POA: Insufficient documentation

## 2024-04-04 DIAGNOSIS — D120 Benign neoplasm of cecum: Principal | ICD-10-CM | POA: Insufficient documentation

## 2024-04-04 DIAGNOSIS — K648 Other hemorrhoids: Secondary | ICD-10-CM | POA: Insufficient documentation

## 2024-04-04 DIAGNOSIS — N179 Acute kidney failure, unspecified: Secondary | ICD-10-CM | POA: Insufficient documentation

## 2024-04-04 DIAGNOSIS — K219 Gastro-esophageal reflux disease without esophagitis: Secondary | ICD-10-CM | POA: Insufficient documentation

## 2024-04-04 DIAGNOSIS — D123 Benign neoplasm of transverse colon: Secondary | ICD-10-CM | POA: Insufficient documentation

## 2024-04-04 DIAGNOSIS — E785 Hyperlipidemia, unspecified: Secondary | ICD-10-CM | POA: Insufficient documentation

## 2024-04-04 LAB — URINALYSIS, ROUTINE W REFLEX MICROSCOPIC
Bacteria, UA: NONE SEEN
Bilirubin Urine: NEGATIVE
Glucose, UA: >=500 mg/dL — AB
Hgb urine dipstick: NEGATIVE
Ketones, ur: NEGATIVE mg/dL
Leukocytes,Ua: NEGATIVE
Nitrite: NEGATIVE
Protein, ur: NEGATIVE mg/dL
Specific Gravity, Urine: 1.014 (ref 1.005–1.030)
pH: 5 (ref 5.0–8.0)

## 2024-04-04 LAB — CBC WITH DIFFERENTIAL/PLATELET
Abs Immature Granulocytes: 0.05 K/uL (ref 0.00–0.07)
Basophils Absolute: 0 K/uL (ref 0.0–0.1)
Basophils Relative: 1 %
Eosinophils Absolute: 0.1 K/uL (ref 0.0–0.5)
Eosinophils Relative: 1 %
HCT: 33.3 % — ABNORMAL LOW (ref 39.0–52.0)
Hemoglobin: 10.9 g/dL — ABNORMAL LOW (ref 13.0–17.0)
Immature Granulocytes: 1 %
Lymphocytes Relative: 48 %
Lymphs Abs: 1.8 K/uL (ref 0.7–4.0)
MCH: 26.8 pg (ref 26.0–34.0)
MCHC: 32.7 g/dL (ref 30.0–36.0)
MCV: 82 fL (ref 80.0–100.0)
Monocytes Absolute: 0.5 K/uL (ref 0.1–1.0)
Monocytes Relative: 13 %
Neutro Abs: 1.4 K/uL — ABNORMAL LOW (ref 1.7–7.7)
Neutrophils Relative %: 36 %
Platelets: 150 K/uL (ref 150–400)
RBC: 4.06 MIL/uL — ABNORMAL LOW (ref 4.22–5.81)
RDW: 16.2 % — ABNORMAL HIGH (ref 11.5–15.5)
Smear Review: NORMAL
WBC: 3.8 K/uL — ABNORMAL LOW (ref 4.0–10.5)
nRBC: 0 % (ref 0.0–0.2)

## 2024-04-04 LAB — GLUCOSE, CAPILLARY
Glucose-Capillary: 89 mg/dL (ref 70–99)
Glucose-Capillary: 102 mg/dL — ABNORMAL HIGH (ref 70–99)

## 2024-04-04 LAB — COMPREHENSIVE METABOLIC PANEL WITH GFR
ALT: 60 U/L — ABNORMAL HIGH (ref 0–44)
AST: 68 U/L — ABNORMAL HIGH (ref 15–41)
Albumin: 3.8 g/dL (ref 3.5–5.0)
Alkaline Phosphatase: 81 U/L (ref 38–126)
Anion gap: 15 (ref 5–15)
BUN: 53 mg/dL — ABNORMAL HIGH (ref 8–23)
CO2: 22 mmol/L (ref 22–32)
Calcium: 8.8 mg/dL — ABNORMAL LOW (ref 8.9–10.3)
Chloride: 100 mmol/L (ref 98–111)
Creatinine, Ser: 1.45 mg/dL — ABNORMAL HIGH (ref 0.61–1.24)
GFR, Estimated: 46 mL/min — ABNORMAL LOW (ref >60)
Glucose, Bld: 116 mg/dL — ABNORMAL HIGH (ref 70–99)
Potassium: 4.5 mmol/L (ref 3.5–5.1)
Sodium: 137 mmol/L (ref 135–145)
Total Bilirubin: 0.7 mg/dL (ref 0.0–1.2)
Total Protein: 6.1 g/dL — ABNORMAL LOW (ref 6.5–8.1)

## 2024-04-04 LAB — LIPASE, BLOOD: Lipase: 24 U/L (ref 11–51)

## 2024-04-04 LAB — HEMOGLOBIN A1C
Hgb A1c MFr Bld: 6.6 % — ABNORMAL HIGH (ref 4.8–5.6)
Mean Plasma Glucose: 142.72 mg/dL

## 2024-04-04 LAB — LACTATE DEHYDROGENASE: LDH: 1128 U/L — ABNORMAL HIGH (ref 105–235)

## 2024-04-04 MED ORDER — ONDANSETRON HCL 4 MG/2ML IJ SOLN: 4.0000 mg | Freq: Four times a day (QID) | INTRAMUSCULAR | Status: DC | PRN

## 2024-04-04 MED ORDER — ACETAMINOPHEN 325 MG PO TABS: 650.0000 mg | ORAL_TABLET | Freq: Four times a day (QID) | ORAL | Status: DC | PRN

## 2024-04-04 MED ORDER — LACTATED RINGERS IV SOLN: INTRAVENOUS | Status: AC

## 2024-04-04 MED ORDER — PEG 3350-KCL-NA BICARB-NACL 420 G PO SOLR
4000.0000 mL | Freq: Once | ORAL | Status: AC
  Administered 2024-04-04: 4000 mL via ORAL

## 2024-04-04 MED ORDER — ACETAMINOPHEN 650 MG RE SUPP
650.0000 mg | Freq: Four times a day (QID) | RECTAL | Status: DC | PRN
Start: 2024-04-04 — End: 2024-04-06

## 2024-04-04 MED ORDER — IOHEXOL 300 MG/ML  SOLN
80.0000 mL | Freq: Once | INTRAMUSCULAR | Status: AC | PRN
  Administered 2024-04-04: 80 mL via INTRAVENOUS

## 2024-04-04 MED ORDER — HYDRALAZINE HCL 20 MG/ML IJ SOLN: 10.0000 mg | INTRAMUSCULAR | Status: DC | PRN

## 2024-04-04 MED ORDER — INSULIN ASPART 100 UNIT/ML IJ SOLN
0.0000 [IU] | INTRAMUSCULAR | Status: DC
  Administered 2024-04-05: 1 [IU] via SUBCUTANEOUS
  Filled 2024-04-04: qty 1

## 2024-04-04 MED ORDER — ONDANSETRON HCL 4 MG PO TABS: 4.0000 mg | ORAL_TABLET | Freq: Four times a day (QID) | ORAL | Status: DC | PRN

## 2024-04-04 MED ORDER — SODIUM CHLORIDE 0.9 % IV BOLUS
500.0000 mL | Freq: Once | INTRAVENOUS | Status: AC
  Administered 2024-04-04: 500 mL via INTRAVENOUS

## 2024-04-04 MED ORDER — ONDANSETRON HCL 4 MG/2ML IJ SOLN
4.0000 mg | Freq: Once | INTRAMUSCULAR | Status: AC
  Administered 2024-04-04: 4 mg via INTRAVENOUS
  Filled 2024-04-04: qty 2

## 2024-04-04 MED ORDER — MORPHINE SULFATE (PF) 2 MG/ML IV SOLN: 2.0000 mg | INTRAVENOUS | Status: DC | PRN

## 2024-04-04 MED ORDER — PANTOPRAZOLE SODIUM 40 MG IV SOLR
40.0000 mg | INTRAVENOUS | Status: DC
  Administered 2024-04-04 – 2024-04-05 (×2): 40 mg via INTRAVENOUS
  Filled 2024-04-04 (×2): qty 10

## 2024-04-04 NOTE — H&P (View-Only) (Signed)
 Gastroenterology Consult   Referring Provider: No ref. provider found Primary Care Physician:  [name], NP Primary Gastroenterologist:  Ozell Hollingshead, MD Patient ID: Cody Palmer; [id number]; Jun 06, 1932   Admit date: 04/04/2024  LOS: 0 days   Date of Consultation: 04/04/2024  Reason for Consultation:  abdominal pain, decreased oral intake    History of Present Illness   Cody Palmer is a 89 y.o. male past medical history of hypertension, type 2 diabetes, hyperlipidemia, chronic kidney disease, recent severe enteritis diagnosed in the ED on November 9 presenting with weakness, abdominal distention, urinary retention, poor oral intake with nausea.  ED course: Blood pressure 144/52, pulse 88, O2 sats 97% on room air, temp 97.8.  White blood cell count 3.8 (down from 17.2 03/24/2024) Hemoglobin 10.9 (down from 12.5 03/24/2024) BUN 53, creatinine 1.45 Total bilirubin 0.7, alk phos 81, AST 68, ALT 60 Lipase 24  CT abdomen pelvis with contrast today (04/04/2024): IMPRESSION: 1. Interval increased ill-defined soft tissue density throughout the base of the mesentery with diffuse stranding of the mesenteric fat and new ascites. Findings are nonspecific, and potentially still inflammatory, but suspicious for lymphoma given the associated enlarged pericardiac, retroperitoneal and inguinal lymph nodes. 2. Persistent wall thickening of the ascending and transverse colon with increased wall thickening of the terminal ileum. These findings could be secondary to infection, inflammatory bowel disease or lymphedema. No evidence of bowel obstruction or perforation. 3. No evidence of urinary tract calculus or hydronephrosis. 4.  Aortic Atherosclerosis (ICD10-I70.0).  GI consult:  Recently seen by NP [name] 03/27/24 for follow-up of ED visit.  Patient had presented to the ED at [facility] November 9 and found to have severe enterocolitis.  Had reported 1 week  history of abdominal pain associate with nausea and feeling constipated.  CT during that ED visit showed severe enterocolitis greatest amount at the duodenum, ileum and right colon.  No drainable abscess or free air.  White blood cell count was 17,200, AST mildly elevated at 55.  He was given Augmentin  at discharge.  At time of being seen in the GI office he reported no ill contacts, no exposure to antibiotics prior to onset of symptoms.  Noted 5 pound weight loss and feeling constipated for a couple of weeks.  Abdomen feeling tight.  Reported nausea but no vomiting.  He was started on PPI, advised to complete antibiotics.  Abdominal x-ray obtained and showed gas with scattered stool in nondilated colon on November 12.  No small bowel dilation.  He was started on MiraLAX  daily.    CT scan was reviewed with radiologist by NP [name].  Majority inflammation noted at the TI and small bowel, less colon involvement but still present.  Some gastric and duodenal thickening.  Left inguinal lymph node looks enlarged.  No obvious concern for ischemic process on CT.  Notable mesenteric stranding, raising concern for lymphoma process.   Noted to have new onset mild thrombocytopenia.  Noted to have atypical hematolymphoid cells on peripheral smear, concerning for malignancy possible lymphoma cells.  Discussion with oncology, recommended to check LDH and flow cytometry.  Hematology oncology referral started. Prior to completing planned workup, patient's daughter, Cody Palmer, works at [employer] in endo, contacted our office regarding decline over the past 48 hours, limited oral intake, decreased urinary output.  He was advised to go to the ED.  Currently he states his abdomen is distended. Feels sore. More discomfort on the right side. No appetite. Some  nausea. No recent vomiting. Feels unable to eat in part due to abdominal distention. Has become much weaker. Decreased urinary output. Feels some urinary retention. No  melena,brbpr. No heartburn, dysphagia.   CT abdomen pelvis with contrast - 03/24/2024: IMPRESSION: 1. Severe enterocolitis greatest about the duodenum, ileum, and right colon. No drainable abscess for free intraperitoneal air.  Colonoscopy in 2009 showed diverticulosis.  Terminal ileum normal.    Prior to Admission medications   Medication Sig Start Date End Date Taking? Authorizing Provider  amLODipine  (NORVASC ) 10 MG tablet Take 10 mg by mouth daily.    [provider]        Lang Norleen POUR, PA-C  aspirin  81 MG chewable tablet Chew 1 tablet (81 mg total) by mouth 2 (two) times daily. Patient taking differently: Chew 81 mg by mouth daily. 07/13/22   Margrette Taft BRAVO, MD  Cholecalciferol  (VITAMIN D3) 25 MCG (1000 UT) CAPS Take 1,000 Units by mouth daily.    [provider]  JARDIANCE  25 MG TABS tablet Take 12.5 mg by mouth daily. 12/09/20   [provider]  labetalol  (NORMODYNE ) 300 MG tablet Take 300 mg by mouth 2 (two) times daily. 11/20/20   [provider]  losartan -hydrochlorothiazide  (HYZAAR) 100-25 MG per tablet Take 1 tablet by mouth daily.    [provider]  metFORMIN  (GLUCOPHAGE ) 500 MG tablet Take 500 mg by mouth 2 (two) times daily with a meal.    [provider]  Multiple Vitamins-Minerals (EYE HEALTH AREDS 2 PO) Take by mouth 2 (two) times daily.    [provider]  pantoprazole  (PROTONIX ) 40 MG tablet Take 1 tablet (40 mg total) by mouth daily. 30 minutes before breakfast 03/27/24   Shirlean Therisa ORN, NP  rosuvastatin  (CRESTOR ) 10 MG tablet Take 10 mg by mouth every other day.    [provider]    No current facility-administered medications for this encounter.   Current Outpatient Medications  Medication Sig Dispense Refill   amLODipine  (NORVASC ) 10 MG tablet Take 10 mg by mouth daily.     amoxicillin-clavulanate (AUGMENTIN) 875-125 MG tablet Take 1 tablet by mouth every 12 (twelve) hours. 14 tablet 0    aspirin  81 MG chewable tablet Chew 1 tablet (81 mg total) by mouth 2 (two) times daily. (Patient taking differently: Chew 81 mg by mouth daily.) 70 tablet 0   Cholecalciferol  (VITAMIN D3) 25 MCG (1000 UT) CAPS Take 1,000 Units by mouth daily.     JARDIANCE  25 MG TABS tablet Take 12.5 mg by mouth daily.     labetalol  (NORMODYNE ) 300 MG tablet Take 300 mg by mouth 2 (two) times daily.     losartan -hydrochlorothiazide  (HYZAAR) 100-25 MG per tablet Take 1 tablet by mouth daily.     metFORMIN  (GLUCOPHAGE ) 500 MG tablet Take 500 mg by mouth 2 (two) times daily with a meal.     Multiple Vitamins-Minerals (EYE HEALTH AREDS 2 PO) Take by mouth 2 (two) times daily.     pantoprazole  (PROTONIX ) 40 MG tablet Take 1 tablet (40 mg total) by mouth daily. 30 minutes before breakfast 30 tablet 3   rosuvastatin  (CRESTOR ) 10 MG tablet Take 10 mg by mouth every other day.      Allergies as of 04/04/2024   (No Known Allergies)    Past Medical History:  Diagnosis Date   Arthritis    Cancer (HCC)    Skin cancer   Diabetes mellitus without complication (HCC)    HTN (hypertension)  Hypercholesteremia     Past Surgical History:  Procedure Laterality Date   CATARACT EXTRACTION W/PHACO Left 04/25/2016   Procedure: CATARACT EXTRACTION PHACO AND INTRAOCULAR LENS PLACEMENT LEFT EYE;  Surgeon: Cherene Mania, MD;  Location: [facility];  Service: Ophthalmology;  Laterality: Left;  CDE: 10.37   CYSTOSCOPY WITH BIOPSY N/A 08/13/2013   Procedure: CYSTOSCOPY WITH BLADDER BIOPSY;  Surgeon: Garnette Shack, MD;  Location: [facility];  Service: Urology;  Laterality: N/A;   ENUCLEATION Right    Removed in 1958   EYE SURGERY  1958   removal of eye   TOTAL KNEE ARTHROPLASTY Right 01/11/2013   Procedure: RIGHT TOTAL KNEE ARTHROPLASTY;  Surgeon: Taft FORBES Minerva, MD;  Location: [facility];  Service: Orthopedics;  Laterality: Right;   TOTAL KNEE ARTHROPLASTY Left 07/12/2022   Procedure: TOTAL KNEE ARTHROPLASTY;  Surgeon: Minerva Taft FORBES, MD;  Location: [facility];  Service: Orthopedics;  Laterality: Left;    Family History  Problem Relation Age of Onset   Arthritis Unknown    Cancer Unknown    Diabetes Unknown     Social History   Socioeconomic History   Marital status: Widowed    Spouse name: Not on file   Number of children: Not on file   Years of education: Not on file   Highest education level: Not on file  Occupational History   Not on file  Tobacco Use   Smoking status: Former    Current packs/day: 0.00    Average packs/day: 1 pack/day for 8.0 years (8.0 ttl pk-yrs)    Types: Cigarettes    Start date: 01/07/1957    Quit date: 01/07/1965    Years since quitting: 59.2   Smokeless tobacco: Never  Vaping Use   Vaping status: Never Used  Substance and Sexual Activity   Alcohol use: No   Drug use: No   Sexual activity: Yes    Birth control/protection: None  Other Topics Concern   Not on file  Social History Narrative   Not on file   Social Drivers of Health   Financial Resource Strain: Not on file  Food Insecurity: Not on file  Transportation Needs: Not on file  Physical Activity: Not on file  Stress: Not on file  Social Connections: Not on file  Intimate Partner Violence: Not on file     Review of System:   General: +anorexia,+ weight loss, +fatigue, +weakness. No fever. Eyes: Negative for vision changes.  ENT: Negative for hoarseness, difficulty swallowing , nasal congestion. CV: Negative for chest pain, angina, palpitations, dyspnea on exertion, peripheral edema.  Respiratory: Negative for dyspnea at rest, dyspnea on exertion, cough, sputum, wheezing.  GI: See history of present illness. GU:  Negative for dysuria, hematuria, urinary incontinence, urinary frequency, nocturnal urination. See hpi MS: Negative for joint pain, low back pain.  Derm: Negative for rash or itching.  Neuro: Negative for weakness, abnormal sensation, seizure, frequent headaches, memory loss, confusion.   Psych: Negative for anxiety, depression, suicidal ideation, hallucinations.  Endo: Negative for unusual weight change. See hpi Heme: Negative for bruising or bleeding. Allergy: Negative for rash or hives.      Physical Examination:   Vital signs in last 24 hours: Temp:  [97.8 F (36.6 C)] 97.8 F (36.6 C) (11/20 0922) Pulse Rate:  [81-88] 81 (11/20 1215) Resp:  [23-25] 23 (11/20 1215) BP: (121-144)/(50-52) 121/50 (11/20 1215) SpO2:  [91 %-97 %] 91 % (11/20 1215) Weight:  [92.4 kg] 92.4 kg (11/20 0922)  General: Well-nourished, well-developed in no acute distress. Daughter, Cody, at bedside. Head: Normocephalic, atraumatic.   Eyes: Conjunctiva pink, no icterus. Mouth: Oropharyngeal mucosa moist and pink , no lesions erythema or exudate. Neck: Supple without thyromegaly, masses, or lymphadenopathy.  Lungs: Clear to auscultation bilaterally.  Heart: Regular rate and rhythm, no murmurs rubs or gallops.  Abdomen: Bowel sounds are normal. Abdomen distended somewhat tense. Slight tenderness mostly right sided. no hepatosplenomegaly or discrete masses, no abdominal bruits, no rebound or guarding. Small umb hernia nontender   Rectal: not performed Extremities: No lower extremity edema, clubbing, deformity.  Neuro: Alert and oriented x 4 , grossly normal neurologically.  Skin: Warm and dry, no rash or jaundice.   Psych: Alert and cooperative, normal mood and affect.        Intake/Output from previous day: No intake/output data recorded. Intake/Output this shift: Total I/O In: 500 [IV Piggyback:500] Out: -   Lab Results:   CBC Recent Labs    04/04/24 0930  WBC 3.8*  HGB 10.9*  HCT 33.3*  MCV 82.0  PLT 150   BMET Recent Labs    04/04/24 0930  NA 137  K 4.5  CL 100  CO2 22  GLUCOSE 116*  BUN 53*  CREATININE 1.45*  CALCIUM  8.8*   LFT Recent Labs    04/04/24 0930  BILITOT 0.7  ALKPHOS 81  AST 68*  ALT 60*  PROT 6.1*  ALBUMIN 3.8    Lipase Recent Labs     04/04/24 0930  LIPASE 24    PT/INR No results for input(s): LABPROT, INR in the last 72 hours.   Hepatitis Panel No results for input(s): HEPBSAG, HCVAB, HEPAIGM, HEPBIGM in the last 72 hours.   Imaging Studies:   CT ABDOMEN PELVIS W CONTRAST Result Date: 04/04/2024 CLINICAL DATA:  Bowel obstruction suspected recent enteritis, persistent distention. Abdominal distension with urinary retention and weakness. Recent antibiotic therapy for abdominal infection (suspected colitis). EXAM: CT ABDOMEN AND PELVIS WITH CONTRAST TECHNIQUE: Multidetector CT imaging of the abdomen and pelvis was performed using the standard protocol following bolus administration of intravenous contrast. RADIATION DOSE REDUCTION: This exam was performed according to the departmental dose-optimization program which includes automated exposure control, adjustment of the mA and/or kV according to patient size and/or use of iterative reconstruction technique. CONTRAST:  80mL OMNIPAQUE  IOHEXOL  300 MG/ML  SOLN COMPARISON:  Prior CTs 03/24/2024 and 07/19/2013. FINDINGS: Lower chest: Clear lung bases. No significant pleural or pericardial effusion. There is atherosclerosis of the aorta and coronary arteries. Prominent right pericardiac node measures 1.7 cm on image 13/2. Hepatobiliary: The liver has a non cirrhotic morphology without suspicious focal abnormality. There is a probable small cyst posteriorly in the right lobe measuring 1.8 cm on image 23/2. This is unchanged from the recent prior study and decreased in size from the 2015 study. Oval focus of enhancement in the dome of the right hepatic lobe measuring 2.1 cm on image 19/2 is unchanged from the recent study, probably an incidental venous anomaly. Gallbladder wall thickening, as before, likely reactive. No evidence of calcified gallstones. No significant biliary dilatation. Pancreas: Fatty atrophy of the pancreatic body and tail. There is ill-defined soft tissue  around the pancreatic head. Spleen: Normal in size without focal abnormality. Adrenals/Urinary Tract: Both adrenal glands appear normal. No evidence of urinary tract calculus, suspicious renal lesion or hydronephrosis. Early delayed post-contrast images demonstrate no significant contrast excretion from either kidney. There is mild symmetric perinephric soft tissue stranding bilaterally. Probable chronic bladder  diverticula. No acute abnormality of the urinary bladder demonstrated. Stomach/Bowel: No enteric contrast administered. The stomach appears unremarkable for its degree of distention. There is wall thickening of the transverse duodenum without significant small bowel distension. Interval increased wall thickening of the terminal ileum. Persistent wall thickening of the ascending and transverse colon. Diffuse diverticulosis of the descending and sigmoid colon without acute inflammation. Vascular/Lymphatic: Prominent left periaortic node measuring 2.1 cm on image 41/2. There is ill-defined soft tissue density throughout the base of the mesentery which has increased compared with the previous prior study, including a component measuring approximately 10.9 x 6.0 cm on image 53/2. This encases the portal and superior mesenteric veins which remain patent. Mildly enlarged inguinal lymph nodes, measuring up to 1.5 cm short axis on image 91/2. Aortic and branch vessel atherosclerosis without evidence of aneurysm or large vessel occlusion. Reproductive: The prostate gland and seminal vesicles appear normal. Other: New perihepatic and perisplenic ascites with fluid in both pericolic gutters. As above, increasing ill-defined soft tissue density throughout the base of the mesentery with diffuse stranding of the mesenteric fat. No focal fluid collection or pneumoperitoneum demonstrated. Small umbilical hernia. Musculoskeletal: No acute or significant osseous findings. Multilevel spondylosis. IMPRESSION: 1. Interval  increased ill-defined soft tissue density throughout the base of the mesentery with diffuse stranding of the mesenteric fat and new ascites. Findings are nonspecific, and potentially still inflammatory, but suspicious for lymphoma given the associated enlarged pericardiac, retroperitoneal and inguinal lymph nodes. 2. Persistent wall thickening of the ascending and transverse colon with increased wall thickening of the terminal ileum. These findings could be secondary to infection, inflammatory bowel disease or lymphedema. No evidence of bowel obstruction or perforation. 3. No evidence of urinary tract calculus or hydronephrosis. 4.  Aortic Atherosclerosis (ICD10-I70.0). Electronically Signed   By: Elsie Perone M.D.   On: 04/04/2024 12:26   DG ABD ACUTE 2+V W 1V CHEST Result Date: 04/04/2024 CLINICAL DATA:  Abdominal distension. EXAM: DG ABDOMEN ACUTE WITH 1 VIEW CHEST COMPARISON:  Radiographs 03/27/2024.  CT 03/24/2024. FINDINGS: Supine and erect views of the abdomen demonstrate mild motion artifact and chronic elevation of the right hemidiaphragm. There is a normal nonobstructive bowel gas pattern and no evidence of pneumoperitoneum. No suspicious abdominal calcifications are identified. Multilevel thoracolumbar spondylosis. IMPRESSION: No radiographic evidence of acute abdominal process. Electronically Signed   By: Elsie Perone M.D.   On: 04/04/2024 10:56   DG Abd 2 Views Result Date: 03/27/2024 CLINICAL DATA:  Diffuse abdominal pain.  Enterocolitis. EXAM: ABDOMEN - 2 VIEW COMPARISON:  CT abdomen pelvis 03/24/2024. FINDINGS: Gas and stool are scattered in nondilated colon. No small bowel dilatation. Degenerative changes in the spine. IMPRESSION: No acute findings. Electronically Signed   By: Newell Eke M.D.   On: 03/27/2024 13:25   CT ABDOMEN PELVIS W CONTRAST Result Date: 03/24/2024 EXAM: CT ABDOMEN AND PELVIS WITH CONTRAST 03/24/2024 12:34:57 PM TECHNIQUE: CT of the abdomen and pelvis was  performed with the administration of 100 mL of iohexol  (OMNIPAQUE ) 300 MG/ML solution. Multiplanar reformatted images are provided for review. Automated exposure control, iterative reconstruction, and/or weight-based adjustment of the mA/kV was utilized to reduce the radiation dose to as low as reasonably achievable. COMPARISON: Comparison with 07/19/2013. CLINICAL HISTORY: Constipation. Laxative not providing relief. FINDINGS: LOWER CHEST: No acute abnormality. LIVER: The liver is unremarkable. GALLBLADDER AND BILE DUCTS: Mild gallbladder wall thickening is favored reactive. No biliary ductal dilatation. SPLEEN: No acute abnormality. PANCREAS: Fatty atrophy of the pancreas. There is relative  sparing of the fat about the pancreas from the adjacent inflammatory process. ADRENAL GLANDS: No acute abnormality. KIDNEYS, URETERS AND BLADDER: No stones in the kidneys or ureters. No hydronephrosis. No perinephric or periureteral stranding. Large bladder diverticula about the right dome of the bladder. GI AND BOWEL: Wall thickening about the ascending and transverse colon with marked adjacent inflammatory stranding and fluid. Extensive diverticulosis about the descending and sigmoid colon. Mild stranding about the descending colon. Wall thickening about the gastric antrum, duodenum, and proximal jejunum with adjacent stranding and free fluid. Additional wall thickening and stranding with engorgement of the vasa recta about the ileum in the right lower quadrant. There is no bowel obstruction. PERITONEUM AND RETROPERITONEUM: No free air. No organized fluid collection. VASCULATURE: Aorta is normal in caliber. Aortic atherosclerotic calcification. LYMPH NODES: No lymphadenopathy. REPRODUCTIVE ORGANS: No acute abnormality. BONES AND SOFT TISSUES: No acute osseous abnormality. No focal soft tissue abnormality. IMPRESSION: 1. Severe enterocolitis greatest about the duodenum, ileum, and right colon. No drainable abscess for free  intraperitoneal air. Electronically signed by: Norman Gatlin MD 03/24/2024 01:18 PM EST RP Workstation: [id number]  [5 week]  Assessment:   89 yo male with past medical history of hypertension, type 2 diabetes, hyperlipidemia, chronic kidney disease, recent severe enteritis diagnosed in the ED on November 9 presenting with weakness, abdominal distention, urinary retention, poor oral intake with nausea. Outpatient work up has been in progress, concerns for possible lymphoma with atypical cells noted on peripheral smear.   Abdominal distention, poor oral intake, abnormal CT: Recent possible severe enteritis on CT 11/9, treated as outpatient with Augmentin . No diarrhea reported at that time. If fact, patient has mostly felt constipated and currently on miralax  daily. Cbc with peripheral smear concerning due to atypical cells and concern for lymphoma, plans as outpatient for hem/onc evaluation, LDH, flow cytometry.  CT today with interval increased ill-defined soft tissue density throughout base of mesentery with diffuse stranding and new ascites. Suspicious for lymphoma with enlarged pericardiac, retroperitoneal, and inguinal lymph nodes. Persistent wall thickening of ascending and transverse colon and increased wall thickening of TI. No bowel obstruction.   He has developed new mild elevation of AST/ALT.  Concern for possible underlying lymphoma, however inflammatory process remains a possibility.   Plan:   LDH, Flow cytometry (per hem/onc original recommendations). Continue clear liquid diet.  He may require endoscopic evaluation to obtain diagnosis. Consider consultation with oncology.    LOS: 0 days   We would like to thank you for the opportunity to participate in the care of DIEZEL MAZUR.  Sonny RAMAN. Ezzard RIGGERS [facility] [phone] 11/20/20251:20 PM

## 2024-04-04 NOTE — Plan of Care (Signed)

## 2024-04-04 NOTE — ED Triage Notes (Signed)
 Pt presents with weakness, urinary retention, abd distention. Pt was diagnoses with abd infection a few week's ago and placed on antibiotics. Not currently on antibiotics. Pt states unable to sleep last night due to grumbling in stomach and bloating.

## 2024-04-04 NOTE — ED Provider Notes (Signed)
 [facility] AT [facility] Provider Note   CSN: [id number] Arrival date & time: 04/04/24  9140     Patient presents with: Abdominal Pain   Cody Palmer is a 89 y.o. male with a history including hypertension, type 2 diabetes, hyperlipidemia, chronic kidney disease and recent diagnosis of severe enteritis per ED evaluation here on November 9, returns here for further evaluation of persistent symptoms.  He endorses loss of appetite, stating he is having to force himself to eat and continues to have abdominal distention although his pain level has significantly proved.  Denies fevers or chills, no chest pain, no shortness of breath, no dysuria but endorses reduced urination.  He last ate yesterday evening, smaller than normal meal and had to force himself to eat, however food intake did not improve or worsen his symptoms.  He denies acid reflux symptoms.  He has completed a course of Augmentin  within the past several days.  He did have a history of constipation, he continues to use MiraLAX  and is having 1-2 small stools per day.  Patient had follow-up with his GI specialist on November 12 at which time additional labs were obtained and it was noted that he had atypical lymphocytes, there is concern about possible malignancy, specifically lymphoma.   The history is provided by the patient, a relative and medical records.       Prior to Admission medications   Medication Sig Start Date End Date Taking? Authorizing Provider  amLODipine  (NORVASC ) 10 MG tablet Take 10 mg by mouth daily.    [provider]  amoxicillin -clavulanate (AUGMENTIN ) 875-125 MG tablet Take 1 tablet by mouth every 12 (twelve) hours. 03/24/24   Robinson, John K, PA-C  aspirin  81 MG chewable tablet Chew 1 tablet (81 mg total) by mouth 2 (two) times daily. Patient taking differently: Chew 81 mg by mouth daily. 07/13/22   Minerva Taft BRAVO, MD  Cholecalciferol  (VITAMIN D3) 25 MCG (1000  UT) CAPS Take 1,000 Units by mouth daily.    [provider]  JARDIANCE  25 MG TABS tablet Take 12.5 mg by mouth daily. 12/09/20   [provider]  labetalol  (NORMODYNE ) 300 MG tablet Take 300 mg by mouth 2 (two) times daily. 11/20/20   [provider]  losartan -hydrochlorothiazide  (HYZAAR) 100-25 MG per tablet Take 1 tablet by mouth daily.    [provider]  metFORMIN  (GLUCOPHAGE ) 500 MG tablet Take 500 mg by mouth 2 (two) times daily with a meal.    [provider]  Multiple Vitamins-Minerals (EYE HEALTH AREDS 2 PO) Take by mouth 2 (two) times daily.    [provider]  pantoprazole  (PROTONIX ) 40 MG tablet Take 1 tablet (40 mg total) by mouth daily. 30 minutes before breakfast 03/27/24   Shirlean Therisa ORN, NP  rosuvastatin  (CRESTOR ) 10 MG tablet Take 10 mg by mouth every other day.    [provider]    Allergies: Patient has no known allergies.    Review of Systems  Constitutional:  Positive for appetite change. Negative for fever.  HENT:  Negative for congestion.   Eyes: Negative.   Respiratory:  Negative for chest tightness and shortness of breath.   Cardiovascular:  Negative for chest pain.  Gastrointestinal:  Positive for abdominal distention and nausea. Negative for abdominal pain, diarrhea and vomiting.  Genitourinary:  Positive for decreased urine volume.  Musculoskeletal:  Negative for arthralgias, joint swelling and neck pain.  Skin: Negative.  Negative for rash and wound.  Neurological:  Negative for dizziness, weakness, light-headedness, numbness and headaches.  Psychiatric/Behavioral: Negative.      Updated Vital Signs BP (!) 121/50   Pulse 81   Temp 97.8 F (36.6 C) (Oral)   Resp (!) 23   Ht 5' 9 (1.753 m)   Wt 92.4 kg   SpO2 91%   BMI 30.08 kg/m   Physical Exam Vitals and nursing note reviewed.  Constitutional:      Appearance: He is well-developed.  HENT:     Head: Normocephalic and atraumatic.   Eyes:     Conjunctiva/sclera: Conjunctivae normal.  Cardiovascular:     Rate and Rhythm: Normal rate and regular rhythm.     Heart sounds: Normal heart sounds.  Pulmonary:     Effort: Pulmonary effort is normal.     Breath sounds: Normal breath sounds. No wheezing.  Abdominal:     General: Bowel sounds are normal. There is distension.     Palpations: Abdomen is soft.     Tenderness: There is no abdominal tenderness.     Comments: Distention with generalized tight sensation with palpation,  not expressing pain at this time.  Firmness noted RLQ, no guard or rebound.  Normoactive bs.  Musculoskeletal:        General: Normal range of motion.     Cervical back: Normal range of motion.  Skin:    General: Skin is warm and dry.  Neurological:     Mental Status: He is alert.     (all labs ordered are listed, but only abnormal results are displayed) Labs Reviewed  CBC WITH DIFFERENTIAL/PLATELET - Abnormal; Notable for the following components:      Result Value   WBC 3.8 (*)    RBC 4.06 (*)    Hemoglobin 10.9 (*)    HCT 33.3 (*)    RDW 16.2 (*)    Neutro Abs 1.4 (*)    All other components within normal limits  COMPREHENSIVE METABOLIC PANEL WITH GFR - Abnormal; Notable for the following components:   Glucose, Bld 116 (*)    BUN 53 (*)    Creatinine, Ser 1.45 (*)    Calcium  8.8 (*)    Total Protein 6.1 (*)    AST 68 (*)    ALT 60 (*)    GFR, Estimated 46 (*)    All other components within normal limits  URINALYSIS, ROUTINE W REFLEX MICROSCOPIC - Abnormal; Notable for the following components:   Glucose, UA >=500 (*)    All other components within normal limits  LIPASE, BLOOD    EKG: None  Radiology: CT ABDOMEN PELVIS W CONTRAST Result Date: 04/04/2024 CLINICAL DATA:  Bowel obstruction suspected recent enteritis, persistent distention. Abdominal distension with urinary retention and weakness. Recent antibiotic therapy for abdominal infection (suspected colitis). EXAM:  CT ABDOMEN AND PELVIS WITH CONTRAST TECHNIQUE: Multidetector CT imaging of the abdomen and pelvis was performed using the standard protocol following bolus administration of intravenous contrast. RADIATION DOSE REDUCTION: This exam was performed according to the departmental dose-optimization program which includes automated exposure control, adjustment of the mA and/or kV according to patient size and/or use of iterative reconstruction technique. CONTRAST:  80mL OMNIPAQUE  IOHEXOL  300 MG/ML  SOLN COMPARISON:  Prior CTs 03/24/2024 and 07/19/2013. FINDINGS: Lower chest: Clear lung bases. No significant pleural or pericardial effusion. There is atherosclerosis of the aorta and coronary arteries. Prominent right pericardiac node measures 1.7 cm on image 13/2. Hepatobiliary: The liver has a non cirrhotic morphology without suspicious focal  abnormality. There is a probable small cyst posteriorly in the right lobe measuring 1.8 cm on image 23/2. This is unchanged from the recent prior study and decreased in size from the 2015 study. Oval focus of enhancement in the dome of the right hepatic lobe measuring 2.1 cm on image 19/2 is unchanged from the recent study, probably an incidental venous anomaly. Gallbladder wall thickening, as before, likely reactive. No evidence of calcified gallstones. No significant biliary dilatation. Pancreas: Fatty atrophy of the pancreatic body and tail. There is ill-defined soft tissue around the pancreatic head. Spleen: Normal in size without focal abnormality. Adrenals/Urinary Tract: Both adrenal glands appear normal. No evidence of urinary tract calculus, suspicious renal lesion or hydronephrosis. Early delayed post-contrast images demonstrate no significant contrast excretion from either kidney. There is mild symmetric perinephric soft tissue stranding bilaterally. Probable chronic bladder diverticula. No acute abnormality of the urinary bladder demonstrated. Stomach/Bowel: No enteric  contrast administered. The stomach appears unremarkable for its degree of distention. There is wall thickening of the transverse duodenum without significant small bowel distension. Interval increased wall thickening of the terminal ileum. Persistent wall thickening of the ascending and transverse colon. Diffuse diverticulosis of the descending and sigmoid colon without acute inflammation. Vascular/Lymphatic: Prominent left periaortic node measuring 2.1 cm on image 41/2. There is ill-defined soft tissue density throughout the base of the mesentery which has increased compared with the previous prior study, including a component measuring approximately 10.9 x 6.0 cm on image 53/2. This encases the portal and superior mesenteric veins which remain patent. Mildly enlarged inguinal lymph nodes, measuring up to 1.5 cm short axis on image 91/2. Aortic and branch vessel atherosclerosis without evidence of aneurysm or large vessel occlusion. Reproductive: The prostate gland and seminal vesicles appear normal. Other: New perihepatic and perisplenic ascites with fluid in both pericolic gutters. As above, increasing ill-defined soft tissue density throughout the base of the mesentery with diffuse stranding of the mesenteric fat. No focal fluid collection or pneumoperitoneum demonstrated. Small umbilical hernia. Musculoskeletal: No acute or significant osseous findings. Multilevel spondylosis. IMPRESSION: 1. Interval increased ill-defined soft tissue density throughout the base of the mesentery with diffuse stranding of the mesenteric fat and new ascites. Findings are nonspecific, and potentially still inflammatory, but suspicious for lymphoma given the associated enlarged pericardiac, retroperitoneal and inguinal lymph nodes. 2. Persistent wall thickening of the ascending and transverse colon with increased wall thickening of the terminal ileum. These findings could be secondary to infection, inflammatory bowel disease or  lymphedema. No evidence of bowel obstruction or perforation. 3. No evidence of urinary tract calculus or hydronephrosis. 4.  Aortic Atherosclerosis (ICD10-I70.0). Electronically Signed   By: Elsie Perone M.D.   On: 04/04/2024 12:26   DG ABD ACUTE 2+V W 1V CHEST Result Date: 04/04/2024 CLINICAL DATA:  Abdominal distension. EXAM: DG ABDOMEN ACUTE WITH 1 VIEW CHEST COMPARISON:  Radiographs 03/27/2024.  CT 03/24/2024. FINDINGS: Supine and erect views of the abdomen demonstrate mild motion artifact and chronic elevation of the right hemidiaphragm. There is a normal nonobstructive bowel gas pattern and no evidence of pneumoperitoneum. No suspicious abdominal calcifications are identified. Multilevel thoracolumbar spondylosis. IMPRESSION: No radiographic evidence of acute abdominal process. Electronically Signed   By: Elsie Perone M.D.   On: 04/04/2024 10:56     Procedures   Medications Ordered in the ED  ondansetron  (ZOFRAN ) injection 4 mg (4 mg Intravenous Given 04/04/24 0945)  sodium chloride  0.9 % bolus 500 mL (0 mLs Intravenous Stopped 04/04/24 1046)  iohexol  (  OMNIPAQUE ) 300 MG/ML solution 80 mL (80 mLs Intravenous Contrast Given 04/04/24 1051)                                    Medical Decision Making Patient presenting with persistent abdominal distention mild abdominal pain, persistent nausea and intolerance of p.o. intake although has been forcing himself to stay hydrated.  Recently treated for suspected severe enteritis with a course of antibiotics but not improved and in a meaningful way.  Followed up with GI and additional blood work has been obtained, concern for possible lymphoma.  Imaging and labs repeated today, no significant new findings based on laboratory tests although he does appear dehydrated with a BUN of 53 and a creatinine of 1.45, his LFTs are also modestly elevated, unclear etiology, denies right upper abdominal pain.  CT imaging revealing an ill-defined soft tissue  density at the base of his mesentery is increased from prior study.  Abdominal lymphadenopathy along with persistent wall thickening of the colon with increased thickening of the terminal ileum.  Amount and/or Complexity of Data Reviewed Labs: ordered.    Details: Per above Radiology: ordered.    Details: Per above Discussion of management or test interpretation with external provider(s): Patient was discussed with Dr. Cinderella with GI, he does recommend admission given his GI intolerance, he will plan endoscopic evaluation tomorrow, has asked that patient maintain a clear liquid diet.  Call placed to the hospitalist for admission.  Discussed with Dr Maree who will admit pt  Risk Prescription drug management. Decision regarding hospitalization.        Final diagnoses:  Abdominal distention  Enteritis    ED Discharge Orders     None          Cody Palmer 04/04/24 1321    Cody Ozell BROCKS, MD 04/04/24 1719

## 2024-04-04 NOTE — H&P (Signed)
 History and Physical    Cody Palmer FMW:[id number] DOB: January 02, 1933 DOA: 04/04/2024  PCP: [name], NP   Patient coming from: Home  Chief Complaint: Abdominal discomfort/distention/poor oral intake  HPI: Cody Palmer is a 89 y.o. male with medical history significant for type 2 diabetes, hypertension, dyslipidemia, osteoarthritis, and GERD who presented to the ED with complaints of nausea and with poor oral tolerance as well as abdominal discomfort and distention.  He apparently was undergoing workup for chronic abdominal pain and generalized symptoms with last GI visit on 03/27/2024 with CT demonstrating concerns for lymphoma process in his abdomen.  He was having further workup for this and was being prepared for referral to hematology/oncology for further evaluation. No fever, chills, or other complaints noted.   ED Course: Vital signs stable and patient was afebrile.  He has been given some antiemetics as well as IV fluid bolus.  CT scan shows some concerns for inflammatory process versus lymphoma.  GI consulted by EDP with recommendations to maintain on clear liquid diet and symptomatically manage with possible need for endoscopy by 11/21.  Review of Systems: Reviewed as noted above, otherwise negative.  Past Medical History:  Diagnosis Date   Arthritis    Cancer (HCC)    Skin cancer   Diabetes mellitus without complication (HCC)    HTN (hypertension)    Hypercholesteremia     Past Surgical History:  Procedure Laterality Date   CATARACT EXTRACTION W/PHACO Left 04/25/2016   Procedure: CATARACT EXTRACTION PHACO AND INTRAOCULAR LENS PLACEMENT LEFT EYE;  Surgeon: Cherene Mania, MD;  Location: [facility];  Service: Ophthalmology;  Laterality: Left;  CDE: 10.37   CYSTOSCOPY WITH BIOPSY N/A 08/13/2013   Procedure: CYSTOSCOPY WITH BLADDER BIOPSY;  Surgeon: Garnette Shack, MD;  Location: [facility];  Service: Urology;  Laterality: N/A;   ENUCLEATION Right    Removed in  1958   EYE SURGERY  1958   removal of eye   TOTAL KNEE ARTHROPLASTY Right 01/11/2013   Procedure: RIGHT TOTAL KNEE ARTHROPLASTY;  Surgeon: Taft FORBES Minerva, MD;  Location: [facility];  Service: Orthopedics;  Laterality: Right;   TOTAL KNEE ARTHROPLASTY Left 07/12/2022   Procedure: TOTAL KNEE ARTHROPLASTY;  Surgeon: Minerva Taft FORBES, MD;  Location: [facility];  Service: Orthopedics;  Laterality: Left;     reports that he quit smoking about 59 years ago. His smoking use included cigarettes. He started smoking about 67 years ago. He has a 8 pack-year smoking history. He has never used smokeless tobacco. He reports that he does not drink alcohol and does not use drugs.  No Known Allergies  Family History  Problem Relation Age of Onset   Arthritis Unknown    Cancer Unknown    Diabetes Unknown     Prior to Admission medications   Medication Sig Start Date End Date Taking? Authorizing Provider  amLODipine  (NORVASC ) 10 MG tablet Take 10 mg by mouth daily.    [provider]  amoxicillin-clavulanate (AUGMENTIN) 875-125 MG tablet Take 1 tablet by mouth every 12 (twelve) hours. 03/24/24   Robinson, John K, PA-C  aspirin  81 MG chewable tablet Chew 1 tablet (81 mg total) by mouth 2 (two) times daily. Patient taking differently: Chew 81 mg by mouth daily. 07/13/22   Minerva Taft FORBES, MD  Cholecalciferol  (VITAMIN D3) 25 MCG (1000 UT) CAPS Take 1,000 Units by mouth daily.    [provider]  JARDIANCE  25 MG TABS tablet Take 12.5 mg by mouth daily. 12/09/20  [provider]  labetalol  (NORMODYNE ) 300 MG tablet Take 300 mg by mouth 2 (two) times daily. 11/20/20   [provider]  losartan -hydrochlorothiazide  (HYZAAR) 100-25 MG per tablet Take 1 tablet by mouth daily.    [provider]  metFORMIN  (GLUCOPHAGE ) 500 MG tablet Take 500 mg by mouth 2 (two) times daily with a meal.    [provider]  Multiple Vitamins-Minerals (EYE HEALTH AREDS 2 PO) Take by  mouth 2 (two) times daily.    [provider]  pantoprazole  (PROTONIX ) 40 MG tablet Take 1 tablet (40 mg total) by mouth daily. 30 minutes before breakfast 03/27/24   Shirlean Therisa ORN, NP  rosuvastatin  (CRESTOR ) 10 MG tablet Take 10 mg by mouth every other day.    [provider]    Physical Exam: Vitals:   04/04/24 0915 04/04/24 0922 04/04/24 1215 04/04/24 1300  BP: (!) 144/52  (!) 121/50 (!) 124/54  Pulse: 88  81 84  Resp: (!) 25  (!) 23 (!) 27  Temp:  97.8 F (36.6 C)    TempSrc:  Oral    SpO2: 97%  91% 92%  Weight:  92.4 kg    Height:  5' 9 (1.753 m)      Constitutional: NAD, calm, comfortable Vitals:   04/04/24 0915 04/04/24 0922 04/04/24 1215 04/04/24 1300  BP: (!) 144/52  (!) 121/50 (!) 124/54  Pulse: 88  81 84  Resp: (!) 25  (!) 23 (!) 27  Temp:  97.8 F (36.6 C)    TempSrc:  Oral    SpO2: 97%  91% 92%  Weight:  92.4 kg    Height:  5' 9 (1.753 m)     Eyes: lids and conjunctivae normal Neck: normal, supple Respiratory: clear to auscultation bilaterally. Normal respiratory effort. No accessory muscle use.  Cardiovascular: Regular rate and rhythm, no murmurs. Abdomen: no tenderness, mild distention. Bowel sounds positive.  Musculoskeletal:  No edema. Skin: no rashes, lesions, ulcers.  Psychiatric: Flat affect  Labs on Admission: I have personally reviewed following labs and imaging studies  CBC: Recent Labs  Lab 04/04/24 0930  WBC 3.8*  NEUTROABS 1.4*  HGB 10.9*  HCT 33.3*  MCV 82.0  PLT 150   Basic Metabolic Panel: Recent Labs  Lab 04/04/24 0930  NA 137  K 4.5  CL 100  CO2 22  GLUCOSE 116*  BUN 53*  CREATININE 1.45*  CALCIUM  8.8*   GFR: Estimated Creatinine Clearance: 38.8 mL/min (A) (by C-G formula based on SCr of 1.45 mg/dL (H)). Liver Function Tests: Recent Labs  Lab 04/04/24 0930  AST 68*  ALT 60*  ALKPHOS 81  BILITOT 0.7  PROT 6.1*  ALBUMIN 3.8   Recent Labs  Lab 04/04/24 0930  LIPASE 24   No results for  input(s): AMMONIA in the last 168 hours. Coagulation Profile: No results for input(s): INR, PROTIME in the last 168 hours. Cardiac Enzymes: No results for input(s): CKTOTAL, CKMB, CKMBINDEX, TROPONINI in the last 168 hours. BNP (last 3 results) No results for input(s): PROBNP in the last 8760 hours. HbA1C: No results for input(s): HGBA1C in the last 72 hours. CBG: No results for input(s): GLUCAP in the last 168 hours. Lipid Profile: No results for input(s): CHOL, HDL, LDLCALC, TRIG, CHOLHDL, LDLDIRECT in the last 72 hours. Thyroid Function Tests: No results for input(s): TSH, T4TOTAL, FREET4, T3FREE, THYROIDAB in the last 72 hours. Anemia Panel: No results for input(s): VITAMINB12, FOLATE, FERRITIN, TIBC, IRON, RETICCTPCT in the last  72 hours. Urine analysis:    Component Value Date/Time   COLORURINE YELLOW 04/04/2024 1043   APPEARANCEUR CLEAR 04/04/2024 1043   LABSPEC 1.014 04/04/2024 1043   PHURINE 5.0 04/04/2024 1043   GLUCOSEU >=500 (A) 04/04/2024 1043   HGBUR NEGATIVE 04/04/2024 1043   BILIRUBINUR NEGATIVE 04/04/2024 1043   KETONESUR NEGATIVE 04/04/2024 1043   PROTEINUR NEGATIVE 04/04/2024 1043   UROBILINOGEN 2.0 (H) 07/04/2013 1917   NITRITE NEGATIVE 04/04/2024 1043   LEUKOCYTESUR NEGATIVE 04/04/2024 1043    Radiological Exams on Admission: CT ABDOMEN PELVIS W CONTRAST Result Date: 04/04/2024 CLINICAL DATA:  Bowel obstruction suspected recent enteritis, persistent distention. Abdominal distension with urinary retention and weakness. Recent antibiotic therapy for abdominal infection (suspected colitis). EXAM: CT ABDOMEN AND PELVIS WITH CONTRAST TECHNIQUE: Multidetector CT imaging of the abdomen and pelvis was performed using the standard protocol following bolus administration of intravenous contrast. RADIATION DOSE REDUCTION: This exam was performed according to the departmental dose-optimization program which includes  automated exposure control, adjustment of the mA and/or kV according to patient size and/or use of iterative reconstruction technique. CONTRAST:  80mL OMNIPAQUE  IOHEXOL  300 MG/ML  SOLN COMPARISON:  Prior CTs 03/24/2024 and 07/19/2013. FINDINGS: Lower chest: Clear lung bases. No significant pleural or pericardial effusion. There is atherosclerosis of the aorta and coronary arteries. Prominent right pericardiac node measures 1.7 cm on image 13/2. Hepatobiliary: The liver has a non cirrhotic morphology without suspicious focal abnormality. There is a probable small cyst posteriorly in the right lobe measuring 1.8 cm on image 23/2. This is unchanged from the recent prior study and decreased in size from the 2015 study. Oval focus of enhancement in the dome of the right hepatic lobe measuring 2.1 cm on image 19/2 is unchanged from the recent study, probably an incidental venous anomaly. Gallbladder wall thickening, as before, likely reactive. No evidence of calcified gallstones. No significant biliary dilatation. Pancreas: Fatty atrophy of the pancreatic body and tail. There is ill-defined soft tissue around the pancreatic head. Spleen: Normal in size without focal abnormality. Adrenals/Urinary Tract: Both adrenal glands appear normal. No evidence of urinary tract calculus, suspicious renal lesion or hydronephrosis. Early delayed post-contrast images demonstrate no significant contrast excretion from either kidney. There is mild symmetric perinephric soft tissue stranding bilaterally. Probable chronic bladder diverticula. No acute abnormality of the urinary bladder demonstrated. Stomach/Bowel: No enteric contrast administered. The stomach appears unremarkable for its degree of distention. There is wall thickening of the transverse duodenum without significant small bowel distension. Interval increased wall thickening of the terminal ileum. Persistent wall thickening of the ascending and transverse colon. Diffuse  diverticulosis of the descending and sigmoid colon without acute inflammation. Vascular/Lymphatic: Prominent left periaortic node measuring 2.1 cm on image 41/2. There is ill-defined soft tissue density throughout the base of the mesentery which has increased compared with the previous prior study, including a component measuring approximately 10.9 x 6.0 cm on image 53/2. This encases the portal and superior mesenteric veins which remain patent. Mildly enlarged inguinal lymph nodes, measuring up to 1.5 cm short axis on image 91/2. Aortic and branch vessel atherosclerosis without evidence of aneurysm or large vessel occlusion. Reproductive: The prostate gland and seminal vesicles appear normal. Other: New perihepatic and perisplenic ascites with fluid in both pericolic gutters. As above, increasing ill-defined soft tissue density throughout the base of the mesentery with diffuse stranding of the mesenteric fat. No focal fluid collection or pneumoperitoneum demonstrated. Small umbilical hernia. Musculoskeletal: No acute or significant osseous findings. Multilevel spondylosis. IMPRESSION:  1. Interval increased ill-defined soft tissue density throughout the base of the mesentery with diffuse stranding of the mesenteric fat and new ascites. Findings are nonspecific, and potentially still inflammatory, but suspicious for lymphoma given the associated enlarged pericardiac, retroperitoneal and inguinal lymph nodes. 2. Persistent wall thickening of the ascending and transverse colon with increased wall thickening of the terminal ileum. These findings could be secondary to infection, inflammatory bowel disease or lymphedema. No evidence of bowel obstruction or perforation. 3. No evidence of urinary tract calculus or hydronephrosis. 4.  Aortic Atherosclerosis (ICD10-I70.0). Electronically Signed   By: Elsie Perone M.D.   On: 04/04/2024 12:26   DG ABD ACUTE 2+V W 1V CHEST Result Date: 04/04/2024 CLINICAL DATA:  Abdominal  distension. EXAM: DG ABDOMEN ACUTE WITH 1 VIEW CHEST COMPARISON:  Radiographs 03/27/2024.  CT 03/24/2024. FINDINGS: Supine and erect views of the abdomen demonstrate mild motion artifact and chronic elevation of the right hemidiaphragm. There is a normal nonobstructive bowel gas pattern and no evidence of pneumoperitoneum. No suspicious abdominal calcifications are identified. Multilevel thoracolumbar spondylosis. IMPRESSION: No radiographic evidence of acute abdominal process. Electronically Signed   By: Elsie Perone M.D.   On: 04/04/2024 10:56    Assessment/Plan Principal Problem:   Abdominal distention    Abdominal distention/N/V with concern for possible lymphoma - Seen by GI outpatient on 03/27/2024 with plans to follow-up with hematology/oncology and further blood work was to be obtained at that time, however due to worsening symptoms he has been recommended to come to the ED - Maintain on clear liquid diet - IV fluid hydration - Symptomatic management with IV pain medications as well as IV antiemetics - Repeat GI evaluation with likely endoscopy on 11/21  Mild AKI - Likely in the setting of dehydration - Baseline creatinine around 0.9-1 - Plan to keep on IV fluid for hydration  Type 2 diabetes - Clear liquid diet - No significant hyperglycemia maintain on SSI - Hold metformin  and Jardiance   Hypertension - Continue amlodipine  and labetalol  - IV hydralazine as needed for severe elevations - Hold losartan /HCTZ  Dyslipidemia - Continue statin  GERD - IV PPI daily  Obesity, class I - BMI 30.08   DVT prophylaxis: SCDs Code Status: DNR Family Communication: Daughter at bedside 11/20 Disposition Plan: Admitted for evaluation of abdominal distention Consults called: GI Admission status: Observation, MedSurg  Severity of Illness: The appropriate patient status for this patient is OBSERVATION. Observation status is judged to be reasonable and necessary in order to  provide the required intensity of service to ensure the patient's safety. The patient's presenting symptoms, physical exam findings, and initial radiographic and laboratory data in the context of their medical condition is felt to place them at decreased risk for further clinical deterioration. Furthermore, it is anticipated that the patient will be medically stable for discharge from the hospital within 2 midnights of admission.    Tegh Franek D Maree DO [facility]  If 7PM-7AM, please contact night-coverage [email or web address]  04/04/2024, 1:40 PM

## 2024-04-04 NOTE — Consult Note (Signed)
 Gastroenterology Consult   Referring Provider: No ref. provider found Primary Care Physician:  [name], NP Primary Gastroenterologist:  Ozell Hollingshead, MD Patient ID: Cody Palmer; [id number]; Jun 06, 1932   Admit date: 04/04/2024  LOS: 0 days   Date of Consultation: 04/04/2024  Reason for Consultation:  abdominal pain, decreased oral intake    History of Present Illness   Cody Palmer is a 89 y.o. male past medical history of hypertension, type 2 diabetes, hyperlipidemia, chronic kidney disease, recent severe enteritis diagnosed in the ED on November 9 presenting with weakness, abdominal distention, urinary retention, poor oral intake with nausea.  ED course: Blood pressure 144/52, pulse 88, O2 sats 97% on room air, temp 97.8.  White blood cell count 3.8 (down from 17.2 03/24/2024) Hemoglobin 10.9 (down from 12.5 03/24/2024) BUN 53, creatinine 1.45 Total bilirubin 0.7, alk phos 81, AST 68, ALT 60 Lipase 24  CT abdomen pelvis with contrast today (04/04/2024): IMPRESSION: 1. Interval increased ill-defined soft tissue density throughout the base of the mesentery with diffuse stranding of the mesenteric fat and new ascites. Findings are nonspecific, and potentially still inflammatory, but suspicious for lymphoma given the associated enlarged pericardiac, retroperitoneal and inguinal lymph nodes. 2. Persistent wall thickening of the ascending and transverse colon with increased wall thickening of the terminal ileum. These findings could be secondary to infection, inflammatory bowel disease or lymphedema. No evidence of bowel obstruction or perforation. 3. No evidence of urinary tract calculus or hydronephrosis. 4.  Aortic Atherosclerosis (ICD10-I70.0).  GI consult:  Recently seen by NP [name] 03/27/24 for follow-up of ED visit.  Patient had presented to the ED at [facility] November 9 and found to have severe enterocolitis.  Had reported 1 week  history of abdominal pain associate with nausea and feeling constipated.  CT during that ED visit showed severe enterocolitis greatest amount at the duodenum, ileum and right colon.  No drainable abscess or free air.  White blood cell count was 17,200, AST mildly elevated at 55.  He was given Augmentin  at discharge.  At time of being seen in the GI office he reported no ill contacts, no exposure to antibiotics prior to onset of symptoms.  Noted 5 pound weight loss and feeling constipated for a couple of weeks.  Abdomen feeling tight.  Reported nausea but no vomiting.  He was started on PPI, advised to complete antibiotics.  Abdominal x-ray obtained and showed gas with scattered stool in nondilated colon on November 12.  No small bowel dilation.  He was started on MiraLAX  daily.    CT scan was reviewed with radiologist by NP [name].  Majority inflammation noted at the TI and small bowel, less colon involvement but still present.  Some gastric and duodenal thickening.  Left inguinal lymph node looks enlarged.  No obvious concern for ischemic process on CT.  Notable mesenteric stranding, raising concern for lymphoma process.   Noted to have new onset mild thrombocytopenia.  Noted to have atypical hematolymphoid cells on peripheral smear, concerning for malignancy possible lymphoma cells.  Discussion with oncology, recommended to check LDH and flow cytometry.  Hematology oncology referral started. Prior to completing planned workup, patient's daughter, Cody Palmer, works at [employer] in endo, contacted our office regarding decline over the past 48 hours, limited oral intake, decreased urinary output.  He was advised to go to the ED.  Currently he states his abdomen is distended. Feels sore. More discomfort on the right side. No appetite. Some  nausea. No recent vomiting. Feels unable to eat in part due to abdominal distention. Has become much weaker. Decreased urinary output. Feels some urinary retention. No  melena,brbpr. No heartburn, dysphagia.   CT abdomen pelvis with contrast - 03/24/2024: IMPRESSION: 1. Severe enterocolitis greatest about the duodenum, ileum, and right colon. No drainable abscess for free intraperitoneal air.  Colonoscopy in 2009 showed diverticulosis.  Terminal ileum normal.    Prior to Admission medications   Medication Sig Start Date End Date Taking? Authorizing Provider  amLODipine  (NORVASC ) 10 MG tablet Take 10 mg by mouth daily.    [provider]        Lang Norleen POUR, PA-C  aspirin  81 MG chewable tablet Chew 1 tablet (81 mg total) by mouth 2 (two) times daily. Patient taking differently: Chew 81 mg by mouth daily. 07/13/22   Margrette Taft BRAVO, MD  Cholecalciferol  (VITAMIN D3) 25 MCG (1000 UT) CAPS Take 1,000 Units by mouth daily.    [provider]  JARDIANCE  25 MG TABS tablet Take 12.5 mg by mouth daily. 12/09/20   [provider]  labetalol  (NORMODYNE ) 300 MG tablet Take 300 mg by mouth 2 (two) times daily. 11/20/20   [provider]  losartan -hydrochlorothiazide  (HYZAAR) 100-25 MG per tablet Take 1 tablet by mouth daily.    [provider]  metFORMIN  (GLUCOPHAGE ) 500 MG tablet Take 500 mg by mouth 2 (two) times daily with a meal.    [provider]  Multiple Vitamins-Minerals (EYE HEALTH AREDS 2 PO) Take by mouth 2 (two) times daily.    [provider]  pantoprazole  (PROTONIX ) 40 MG tablet Take 1 tablet (40 mg total) by mouth daily. 30 minutes before breakfast 03/27/24   Shirlean Therisa ORN, NP  rosuvastatin  (CRESTOR ) 10 MG tablet Take 10 mg by mouth every other day.    [provider]    No current facility-administered medications for this encounter.   Current Outpatient Medications  Medication Sig Dispense Refill   amLODipine  (NORVASC ) 10 MG tablet Take 10 mg by mouth daily.     amoxicillin-clavulanate (AUGMENTIN) 875-125 MG tablet Take 1 tablet by mouth every 12 (twelve) hours. 14 tablet 0    aspirin  81 MG chewable tablet Chew 1 tablet (81 mg total) by mouth 2 (two) times daily. (Patient taking differently: Chew 81 mg by mouth daily.) 70 tablet 0   Cholecalciferol  (VITAMIN D3) 25 MCG (1000 UT) CAPS Take 1,000 Units by mouth daily.     JARDIANCE  25 MG TABS tablet Take 12.5 mg by mouth daily.     labetalol  (NORMODYNE ) 300 MG tablet Take 300 mg by mouth 2 (two) times daily.     losartan -hydrochlorothiazide  (HYZAAR) 100-25 MG per tablet Take 1 tablet by mouth daily.     metFORMIN  (GLUCOPHAGE ) 500 MG tablet Take 500 mg by mouth 2 (two) times daily with a meal.     Multiple Vitamins-Minerals (EYE HEALTH AREDS 2 PO) Take by mouth 2 (two) times daily.     pantoprazole  (PROTONIX ) 40 MG tablet Take 1 tablet (40 mg total) by mouth daily. 30 minutes before breakfast 30 tablet 3   rosuvastatin  (CRESTOR ) 10 MG tablet Take 10 mg by mouth every other day.      Allergies as of 04/04/2024   (No Known Allergies)    Past Medical History:  Diagnosis Date   Arthritis    Cancer (HCC)    Skin cancer   Diabetes mellitus without complication (HCC)    HTN (hypertension)  Hypercholesteremia     Past Surgical History:  Procedure Laterality Date   CATARACT EXTRACTION W/PHACO Left 04/25/2016   Procedure: CATARACT EXTRACTION PHACO AND INTRAOCULAR LENS PLACEMENT LEFT EYE;  Surgeon: Cherene Mania, MD;  Location: [facility];  Service: Ophthalmology;  Laterality: Left;  CDE: 10.37   CYSTOSCOPY WITH BIOPSY N/A 08/13/2013   Procedure: CYSTOSCOPY WITH BLADDER BIOPSY;  Surgeon: Garnette Shack, MD;  Location: [facility];  Service: Urology;  Laterality: N/A;   ENUCLEATION Right    Removed in 1958   EYE SURGERY  1958   removal of eye   TOTAL KNEE ARTHROPLASTY Right 01/11/2013   Procedure: RIGHT TOTAL KNEE ARTHROPLASTY;  Surgeon: Taft FORBES Minerva, MD;  Location: [facility];  Service: Orthopedics;  Laterality: Right;   TOTAL KNEE ARTHROPLASTY Left 07/12/2022   Procedure: TOTAL KNEE ARTHROPLASTY;  Surgeon: Minerva Taft FORBES, MD;  Location: [facility];  Service: Orthopedics;  Laterality: Left;    Family History  Problem Relation Age of Onset   Arthritis Unknown    Cancer Unknown    Diabetes Unknown     Social History   Socioeconomic History   Marital status: Widowed    Spouse name: Not on file   Number of children: Not on file   Years of education: Not on file   Highest education level: Not on file  Occupational History   Not on file  Tobacco Use   Smoking status: Former    Current packs/day: 0.00    Average packs/day: 1 pack/day for 8.0 years (8.0 ttl pk-yrs)    Types: Cigarettes    Start date: 01/07/1957    Quit date: 01/07/1965    Years since quitting: 59.2   Smokeless tobacco: Never  Vaping Use   Vaping status: Never Used  Substance and Sexual Activity   Alcohol use: No   Drug use: No   Sexual activity: Yes    Birth control/protection: None  Other Topics Concern   Not on file  Social History Narrative   Not on file   Social Drivers of Health   Financial Resource Strain: Not on file  Food Insecurity: Not on file  Transportation Needs: Not on file  Physical Activity: Not on file  Stress: Not on file  Social Connections: Not on file  Intimate Partner Violence: Not on file     Review of System:   General: +anorexia,+ weight loss, +fatigue, +weakness. No fever. Eyes: Negative for vision changes.  ENT: Negative for hoarseness, difficulty swallowing , nasal congestion. CV: Negative for chest pain, angina, palpitations, dyspnea on exertion, peripheral edema.  Respiratory: Negative for dyspnea at rest, dyspnea on exertion, cough, sputum, wheezing.  GI: See history of present illness. GU:  Negative for dysuria, hematuria, urinary incontinence, urinary frequency, nocturnal urination. See hpi MS: Negative for joint pain, low back pain.  Derm: Negative for rash or itching.  Neuro: Negative for weakness, abnormal sensation, seizure, frequent headaches, memory loss, confusion.   Psych: Negative for anxiety, depression, suicidal ideation, hallucinations.  Endo: Negative for unusual weight change. See hpi Heme: Negative for bruising or bleeding. Allergy: Negative for rash or hives.      Physical Examination:   Vital signs in last 24 hours: Temp:  [97.8 F (36.6 C)] 97.8 F (36.6 C) (11/20 0922) Pulse Rate:  [81-88] 81 (11/20 1215) Resp:  [23-25] 23 (11/20 1215) BP: (121-144)/(50-52) 121/50 (11/20 1215) SpO2:  [91 %-97 %] 91 % (11/20 1215) Weight:  [92.4 kg] 92.4 kg (11/20 0922)  General: Well-nourished, well-developed in no acute distress. Daughter, Cody, at bedside. Head: Normocephalic, atraumatic.   Eyes: Conjunctiva pink, no icterus. Mouth: Oropharyngeal mucosa moist and pink , no lesions erythema or exudate. Neck: Supple without thyromegaly, masses, or lymphadenopathy.  Lungs: Clear to auscultation bilaterally.  Heart: Regular rate and rhythm, no murmurs rubs or gallops.  Abdomen: Bowel sounds are normal. Abdomen distended somewhat tense. Slight tenderness mostly right sided. no hepatosplenomegaly or discrete masses, no abdominal bruits, no rebound or guarding. Small umb hernia nontender   Rectal: not performed Extremities: No lower extremity edema, clubbing, deformity.  Neuro: Alert and oriented x 4 , grossly normal neurologically.  Skin: Warm and dry, no rash or jaundice.   Psych: Alert and cooperative, normal mood and affect.        Intake/Output from previous day: No intake/output data recorded. Intake/Output this shift: Total I/O In: 500 [IV Piggyback:500] Out: -   Lab Results:   CBC Recent Labs    04/04/24 0930  WBC 3.8*  HGB 10.9*  HCT 33.3*  MCV 82.0  PLT 150   BMET Recent Labs    04/04/24 0930  NA 137  K 4.5  CL 100  CO2 22  GLUCOSE 116*  BUN 53*  CREATININE 1.45*  CALCIUM  8.8*   LFT Recent Labs    04/04/24 0930  BILITOT 0.7  ALKPHOS 81  AST 68*  ALT 60*  PROT 6.1*  ALBUMIN 3.8    Lipase Recent Labs     04/04/24 0930  LIPASE 24    PT/INR No results for input(s): LABPROT, INR in the last 72 hours.   Hepatitis Panel No results for input(s): HEPBSAG, HCVAB, HEPAIGM, HEPBIGM in the last 72 hours.   Imaging Studies:   CT ABDOMEN PELVIS W CONTRAST Result Date: 04/04/2024 CLINICAL DATA:  Bowel obstruction suspected recent enteritis, persistent distention. Abdominal distension with urinary retention and weakness. Recent antibiotic therapy for abdominal infection (suspected colitis). EXAM: CT ABDOMEN AND PELVIS WITH CONTRAST TECHNIQUE: Multidetector CT imaging of the abdomen and pelvis was performed using the standard protocol following bolus administration of intravenous contrast. RADIATION DOSE REDUCTION: This exam was performed according to the departmental dose-optimization program which includes automated exposure control, adjustment of the mA and/or kV according to patient size and/or use of iterative reconstruction technique. CONTRAST:  80mL OMNIPAQUE  IOHEXOL  300 MG/ML  SOLN COMPARISON:  Prior CTs 03/24/2024 and 07/19/2013. FINDINGS: Lower chest: Clear lung bases. No significant pleural or pericardial effusion. There is atherosclerosis of the aorta and coronary arteries. Prominent right pericardiac node measures 1.7 cm on image 13/2. Hepatobiliary: The liver has a non cirrhotic morphology without suspicious focal abnormality. There is a probable small cyst posteriorly in the right lobe measuring 1.8 cm on image 23/2. This is unchanged from the recent prior study and decreased in size from the 2015 study. Oval focus of enhancement in the dome of the right hepatic lobe measuring 2.1 cm on image 19/2 is unchanged from the recent study, probably an incidental venous anomaly. Gallbladder wall thickening, as before, likely reactive. No evidence of calcified gallstones. No significant biliary dilatation. Pancreas: Fatty atrophy of the pancreatic body and tail. There is ill-defined soft tissue  around the pancreatic head. Spleen: Normal in size without focal abnormality. Adrenals/Urinary Tract: Both adrenal glands appear normal. No evidence of urinary tract calculus, suspicious renal lesion or hydronephrosis. Early delayed post-contrast images demonstrate no significant contrast excretion from either kidney. There is mild symmetric perinephric soft tissue stranding bilaterally. Probable chronic bladder  diverticula. No acute abnormality of the urinary bladder demonstrated. Stomach/Bowel: No enteric contrast administered. The stomach appears unremarkable for its degree of distention. There is wall thickening of the transverse duodenum without significant small bowel distension. Interval increased wall thickening of the terminal ileum. Persistent wall thickening of the ascending and transverse colon. Diffuse diverticulosis of the descending and sigmoid colon without acute inflammation. Vascular/Lymphatic: Prominent left periaortic node measuring 2.1 cm on image 41/2. There is ill-defined soft tissue density throughout the base of the mesentery which has increased compared with the previous prior study, including a component measuring approximately 10.9 x 6.0 cm on image 53/2. This encases the portal and superior mesenteric veins which remain patent. Mildly enlarged inguinal lymph nodes, measuring up to 1.5 cm short axis on image 91/2. Aortic and branch vessel atherosclerosis without evidence of aneurysm or large vessel occlusion. Reproductive: The prostate gland and seminal vesicles appear normal. Other: New perihepatic and perisplenic ascites with fluid in both pericolic gutters. As above, increasing ill-defined soft tissue density throughout the base of the mesentery with diffuse stranding of the mesenteric fat. No focal fluid collection or pneumoperitoneum demonstrated. Small umbilical hernia. Musculoskeletal: No acute or significant osseous findings. Multilevel spondylosis. IMPRESSION: 1. Interval  increased ill-defined soft tissue density throughout the base of the mesentery with diffuse stranding of the mesenteric fat and new ascites. Findings are nonspecific, and potentially still inflammatory, but suspicious for lymphoma given the associated enlarged pericardiac, retroperitoneal and inguinal lymph nodes. 2. Persistent wall thickening of the ascending and transverse colon with increased wall thickening of the terminal ileum. These findings could be secondary to infection, inflammatory bowel disease or lymphedema. No evidence of bowel obstruction or perforation. 3. No evidence of urinary tract calculus or hydronephrosis. 4.  Aortic Atherosclerosis (ICD10-I70.0). Electronically Signed   By: Elsie Perone M.D.   On: 04/04/2024 12:26   DG ABD ACUTE 2+V W 1V CHEST Result Date: 04/04/2024 CLINICAL DATA:  Abdominal distension. EXAM: DG ABDOMEN ACUTE WITH 1 VIEW CHEST COMPARISON:  Radiographs 03/27/2024.  CT 03/24/2024. FINDINGS: Supine and erect views of the abdomen demonstrate mild motion artifact and chronic elevation of the right hemidiaphragm. There is a normal nonobstructive bowel gas pattern and no evidence of pneumoperitoneum. No suspicious abdominal calcifications are identified. Multilevel thoracolumbar spondylosis. IMPRESSION: No radiographic evidence of acute abdominal process. Electronically Signed   By: Elsie Perone M.D.   On: 04/04/2024 10:56   DG Abd 2 Views Result Date: 03/27/2024 CLINICAL DATA:  Diffuse abdominal pain.  Enterocolitis. EXAM: ABDOMEN - 2 VIEW COMPARISON:  CT abdomen pelvis 03/24/2024. FINDINGS: Gas and stool are scattered in nondilated colon. No small bowel dilatation. Degenerative changes in the spine. IMPRESSION: No acute findings. Electronically Signed   By: Newell Eke M.D.   On: 03/27/2024 13:25   CT ABDOMEN PELVIS W CONTRAST Result Date: 03/24/2024 EXAM: CT ABDOMEN AND PELVIS WITH CONTRAST 03/24/2024 12:34:57 PM TECHNIQUE: CT of the abdomen and pelvis was  performed with the administration of 100 mL of iohexol  (OMNIPAQUE ) 300 MG/ML solution. Multiplanar reformatted images are provided for review. Automated exposure control, iterative reconstruction, and/or weight-based adjustment of the mA/kV was utilized to reduce the radiation dose to as low as reasonably achievable. COMPARISON: Comparison with 07/19/2013. CLINICAL HISTORY: Constipation. Laxative not providing relief. FINDINGS: LOWER CHEST: No acute abnormality. LIVER: The liver is unremarkable. GALLBLADDER AND BILE DUCTS: Mild gallbladder wall thickening is favored reactive. No biliary ductal dilatation. SPLEEN: No acute abnormality. PANCREAS: Fatty atrophy of the pancreas. There is relative  sparing of the fat about the pancreas from the adjacent inflammatory process. ADRENAL GLANDS: No acute abnormality. KIDNEYS, URETERS AND BLADDER: No stones in the kidneys or ureters. No hydronephrosis. No perinephric or periureteral stranding. Large bladder diverticula about the right dome of the bladder. GI AND BOWEL: Wall thickening about the ascending and transverse colon with marked adjacent inflammatory stranding and fluid. Extensive diverticulosis about the descending and sigmoid colon. Mild stranding about the descending colon. Wall thickening about the gastric antrum, duodenum, and proximal jejunum with adjacent stranding and free fluid. Additional wall thickening and stranding with engorgement of the vasa recta about the ileum in the right lower quadrant. There is no bowel obstruction. PERITONEUM AND RETROPERITONEUM: No free air. No organized fluid collection. VASCULATURE: Aorta is normal in caliber. Aortic atherosclerotic calcification. LYMPH NODES: No lymphadenopathy. REPRODUCTIVE ORGANS: No acute abnormality. BONES AND SOFT TISSUES: No acute osseous abnormality. No focal soft tissue abnormality. IMPRESSION: 1. Severe enterocolitis greatest about the duodenum, ileum, and right colon. No drainable abscess for free  intraperitoneal air. Electronically signed by: Norman Gatlin MD 03/24/2024 01:18 PM EST RP Workstation: [id number]  [5 week]  Assessment:   89 yo male with past medical history of hypertension, type 2 diabetes, hyperlipidemia, chronic kidney disease, recent severe enteritis diagnosed in the ED on November 9 presenting with weakness, abdominal distention, urinary retention, poor oral intake with nausea. Outpatient work up has been in progress, concerns for possible lymphoma with atypical cells noted on peripheral smear.   Abdominal distention, poor oral intake, abnormal CT: Recent possible severe enteritis on CT 11/9, treated as outpatient with Augmentin. No diarrhea reported at that time. If fact, patient has mostly felt constipated and currently on miralax  daily. Cbc with peripheral smear concerning due to atypical cells and concern for lymphoma, plans as outpatient for hem/onc evaluation, LDH, flow cytometry.  CT today with interval increased ill-defined soft tissue density throughout base of mesentery with diffuse stranding and new ascites. Suspicious for lymphoma with enlarged pericardiac, retroperitoneal, and inguinal lymph nodes. Persistent wall thickening of ascending and transverse colon and increased wall thickening of TI. No bowel obstruction.   He has developed new mild elevation of AST/ALT.  Concern for possible underlying lymphoma, however inflammatory process remains a possibility.   Plan:   LDH, Flow cytometry (per hem/onc original recommendations). Continue clear liquid diet.  He may require endoscopic evaluation to obtain diagnosis. Consider consultation with oncology.    LOS: 0 days   We would like to thank you for the opportunity to participate in the care of Cody Palmer.  Sonny RAMAN. Ezzard RIGGERS [facility] [phone] 11/20/20251:20 PM

## 2024-04-05 ENCOUNTER — Encounter (HOSPITAL_COMMUNITY)
Admission: EM | Disposition: A | Discharge status: Home / Self Care | Payer: Self-pay | Source: Home / Self Care | Attending: Emergency Medicine

## 2024-04-05 ENCOUNTER — Observation Stay (HOSPITAL_COMMUNITY): Admitting: Anesthesiology

## 2024-04-05 ENCOUNTER — Encounter (HOSPITAL_COMMUNITY): Payer: Self-pay | Admitting: Internal Medicine

## 2024-04-05 ENCOUNTER — Observation Stay (HOSPITAL_COMMUNITY)

## 2024-04-05 DIAGNOSIS — D120 Benign neoplasm of cecum: Secondary | ICD-10-CM | POA: Diagnosis not present

## 2024-04-05 DIAGNOSIS — Z7984 Long term (current) use of oral hypoglycemic drugs: Secondary | ICD-10-CM | POA: Diagnosis not present

## 2024-04-05 DIAGNOSIS — E119 Type 2 diabetes mellitus without complications: Secondary | ICD-10-CM | POA: Diagnosis not present

## 2024-04-05 DIAGNOSIS — D122 Benign neoplasm of ascending colon: Secondary | ICD-10-CM | POA: Diagnosis not present

## 2024-04-05 DIAGNOSIS — K5730 Diverticulosis of large intestine without perforation or abscess without bleeding: Secondary | ICD-10-CM

## 2024-04-05 DIAGNOSIS — I10 Essential (primary) hypertension: Secondary | ICD-10-CM | POA: Diagnosis not present

## 2024-04-05 DIAGNOSIS — R140 Abdominal distension (gaseous): Secondary | ICD-10-CM | POA: Diagnosis not present

## 2024-04-05 DIAGNOSIS — D123 Benign neoplasm of transverse colon: Secondary | ICD-10-CM

## 2024-04-05 DIAGNOSIS — K648 Other hemorrhoids: Secondary | ICD-10-CM

## 2024-04-05 HISTORY — PX: COLONOSCOPY: SHX5424

## 2024-04-05 LAB — COMPREHENSIVE METABOLIC PANEL WITH GFR
ALT: 55 U/L — ABNORMAL HIGH (ref 0–44)
AST: 57 U/L — ABNORMAL HIGH (ref 15–41)
Albumin: 3.8 g/dL (ref 3.5–5.0)
Alkaline Phosphatase: 80 U/L (ref 38–126)
Anion gap: 13 (ref 5–15)
BUN: 37 mg/dL — ABNORMAL HIGH (ref 8–23)
CO2: 23 mmol/L (ref 22–32)
Calcium: 8.6 mg/dL — ABNORMAL LOW (ref 8.9–10.3)
Chloride: 101 mmol/L (ref 98–111)
Creatinine, Ser: 1.27 mg/dL — ABNORMAL HIGH (ref 0.61–1.24)
GFR, Estimated: 54 mL/min — ABNORMAL LOW (ref >60)
Glucose, Bld: 97 mg/dL (ref 70–99)
Potassium: 4.4 mmol/L (ref 3.5–5.1)
Sodium: 137 mmol/L (ref 135–145)
Total Bilirubin: 0.7 mg/dL (ref 0.0–1.2)
Total Protein: 5.8 g/dL — ABNORMAL LOW (ref 6.5–8.1)

## 2024-04-05 LAB — CBC
HCT: 31.7 % — ABNORMAL LOW (ref 39.0–52.0)
Hemoglobin: 10.5 g/dL — ABNORMAL LOW (ref 13.0–17.0)
MCH: 26.9 pg (ref 26.0–34.0)
MCHC: 33.1 g/dL (ref 30.0–36.0)
MCV: 81.3 fL (ref 80.0–100.0)
Platelets: 144 K/uL — ABNORMAL LOW (ref 150–400)
RBC: 3.9 MIL/uL — ABNORMAL LOW (ref 4.22–5.81)
RDW: 16.1 % — ABNORMAL HIGH (ref 11.5–15.5)
WBC: 3.5 K/uL — ABNORMAL LOW (ref 4.0–10.5)
nRBC: 0 % (ref 0.0–0.2)

## 2024-04-05 LAB — FERRITIN: Ferritin: 317 ng/mL (ref 24–336)

## 2024-04-05 LAB — MAGNESIUM: Magnesium: 2.7 mg/dL — ABNORMAL HIGH (ref 1.7–2.4)

## 2024-04-05 LAB — IRON AND TIBC
Iron: 47 ug/dL (ref 45–182)
Saturation Ratios: 21 % (ref 17.9–39.5)
TIBC: 217 ug/dL — ABNORMAL LOW (ref 250–450)
UIBC: 171 ug/dL

## 2024-04-05 LAB — VITAMIN B12: Vitamin B-12: 308 pg/mL (ref 180–914)

## 2024-04-05 LAB — GLUCOSE, CAPILLARY
Glucose-Capillary: 100 mg/dL — ABNORMAL HIGH (ref 70–99)
Glucose-Capillary: 102 mg/dL — ABNORMAL HIGH (ref 70–99)
Glucose-Capillary: 103 mg/dL — ABNORMAL HIGH (ref 70–99)
Glucose-Capillary: 140 mg/dL — ABNORMAL HIGH (ref 70–99)
Glucose-Capillary: 91 mg/dL (ref 70–99)
Glucose-Capillary: 92 mg/dL (ref 70–99)
Glucose-Capillary: 99 mg/dL (ref 70–99)

## 2024-04-05 LAB — FOLATE: Folate: 9.8 ng/mL (ref >5.9)

## 2024-04-05 LAB — URIC ACID: Uric Acid, Serum: 13.3 mg/dL — ABNORMAL HIGH (ref 3.7–8.6)

## 2024-04-05 SURGERY — COLONOSCOPY
Anesthesia: General

## 2024-04-05 MED ORDER — LACTATED RINGERS IV SOLN: INTRAVENOUS | Status: DC

## 2024-04-05 MED ORDER — LIDOCAINE 2% (20 MG/ML) 5 ML SYRINGE
INTRAMUSCULAR | Status: AC
  Filled 2024-04-05: qty 5

## 2024-04-05 MED ORDER — LIDOCAINE 2% (20 MG/ML) 5 ML SYRINGE
INTRAMUSCULAR | Status: DC | PRN
  Administered 2024-04-05: 40 mg via INTRAVENOUS

## 2024-04-05 MED ORDER — IOHEXOL 300 MG/ML  SOLN
75.0000 mL | Freq: Once | INTRAMUSCULAR | Status: AC | PRN
  Administered 2024-04-05: 75 mL via INTRAVENOUS

## 2024-04-05 MED ORDER — PROPOFOL 500 MG/50ML IV EMUL
INTRAVENOUS | Status: DC | PRN
  Administered 2024-04-05: 50 mg via INTRAVENOUS
  Administered 2024-04-05: 125 ug/kg/min via INTRAVENOUS

## 2024-04-05 MED ORDER — SODIUM CHLORIDE 0.9 % IV SOLN: INTRAVENOUS | Status: DC

## 2024-04-05 MED ORDER — STERILE WATER FOR IRRIGATION IR SOLN
Status: DC | PRN
  Administered 2024-04-05: 120 mL

## 2024-04-05 MED ORDER — BOOST / RESOURCE BREEZE PO LIQD CUSTOM
1.0000 | Freq: Three times a day (TID) | ORAL | Status: DC
  Administered 2024-04-05 – 2024-04-06 (×2): 1 via ORAL

## 2024-04-05 NOTE — Care Management Obs Status (Signed)
 MEDICARE OBSERVATION STATUS NOTIFICATION   Patient Details  Name: Cody Palmer MRN: [id number] Date of Birth: 07/07/1932   Medicare Observation Status Notification Given:  Yes    Cody Palmer 04/05/2024, 4:26 PM

## 2024-04-05 NOTE — Op Note (Signed)
 [facility] Patient Name: Cody Palmer Procedure Date: 04/05/2024 1:36 PM MRN: [id number] Date of Birth: 1932/10/17 Attending MD: Carlin POUR. Cindie , [place], [id number] CSN: [id number] Age: 89 Admit Type: Inpatient Procedure:                Colonoscopy Indications:              Abnormal CT of the GI tract Providers:                Carlin POUR. Cindie, DO, Emilee Tubb RN, RN, Madelin Hunter, RN Referring MD:              Medicines:                See the Anesthesia note for documentation of the                            administered medications Complications:            No immediate complications. Estimated Blood Loss:     Estimated blood loss was minimal. Procedure:                Pre-Anesthesia Assessment:                           - The anesthesia plan was to use monitored                            anesthesia care (MAC).                           After obtaining informed consent, the colonoscope                            was passed under direct vision. Throughout the                            procedure, the patient's blood pressure, pulse, and                            oxygen saturations were monitored continuously. The                            [id number]-HQ190L ([id number]) Peds Colon was introduced                            through the anus and advanced to the the terminal                            ileum, with identification of the appendiceal                            orifice and IC valve. The colonoscopy was performed                            without difficulty.  The patient tolerated the                            procedure well. The quality of the bowel                            preparation was evaluated using the BBPS [facility]                            Bowel Preparation Scale) with scores of: Right                            Colon = 3, Transverse Colon = 3 and Left Colon = 3                            (entire mucosa seen well with no residual  staining,                            small fragments of stool or opaque liquid). The                            total BBPS score equals 9. Scope In: 1:53:26 PM Scope Out: 2:09:22 PM Scope Withdrawal Time: 0 hours 13 minutes 31 seconds  Total Procedure Duration: 0 hours 15 minutes 56 seconds  Findings:      Non-bleeding internal hemorrhoids were found.      Scattered large-mouthed and small-mouthed diverticula were found in the       entire colon.      Three sessile polyps were found in the ascending colon and cecum. The       polyps were 3 to 6 mm in size. These polyps were removed with a cold       snare. Resection and retrieval were complete.      Three sessile polyps were found in the transverse colon. The polyps were       4 to 7 mm in size. These polyps were removed with a cold snare.       Resection and retrieval were complete.      The terminal ileum appeared normal.      The exam was otherwise without abnormality. Impression:               - Non-bleeding internal hemorrhoids.                           - Diverticulosis in the entire examined colon.                           - Three 3 to 6 mm polyps in the ascending colon and                            in the cecum, removed with a cold snare. Resected                            and retrieved.                           -  Three 4 to 7 mm polyps in the transverse colon,                            removed with a cold snare. Resected and retrieved.                           - The examined portion of the ileum was normal.                           - The examination was otherwise normal. Moderate Sedation:      Per Anesthesia Care Recommendation:           - Return patient to hospital ward for ongoing care.                           - Resume regular diet.                           - Continue present medications.                           - Await pathology results.                           - No repeat colonoscopy due to age.                            - IR consult for possible biopsy                           - Oncology consult. Procedure Code(s):        --- Professional ---                           [phone], Colonoscopy, flexible; with removal of                            tumor(s), polyp(s), or other lesion(s) by snare                            technique Diagnosis Code(s):        --- Professional ---                           K64.8, Other hemorrhoids                           D12.2, Benign neoplasm of ascending colon                           D12.0, Benign neoplasm of cecum                           D12.3, Benign neoplasm of transverse colon (hepatic                            flexure or splenic  flexure)                           K57.30, Diverticulosis of large intestine without                            perforation or abscess without bleeding                           R93.3, Abnormal findings on diagnostic imaging of                            other parts of digestive tract CPT copyright 2022 American Medical Association. All rights reserved. The codes documented in this report are preliminary and upon coder review may  be revised to meet current compliance requirements. Carlin POUR. Cindie, DO Carlin POUR. Cindie, DO 04/05/2024 2:14:15 PM This report has been signed electronically. Number of Addenda: 0

## 2024-04-05 NOTE — Progress Notes (Signed)
 PROGRESS NOTE    Cody Palmer  FMW:[id number] DOB: 1932-05-28 DOA: 04/04/2024 PCP: [name], NP   Brief Narrative:    Cody Palmer is a 89 y.o. male with medical history significant for type 2 diabetes, hypertension, dyslipidemia, osteoarthritis, and GERD who presented to the ED with complaints of nausea and with poor oral tolerance as well as abdominal discomfort and distention.  He apparently was undergoing workup for chronic abdominal pain and generalized symptoms with last GI visit on 03/27/2024 with CT demonstrating concerns for lymphoma process in his abdomen.  Patient was admitted for evaluation of abdominal distention with possible concern for lymphoma and has undergone colonoscopy on 11/21 with overall healthy looking colon and no evidence of malignancy in 6 small polyps were removed.  Oncology/IR evaluation further pending.  Assessment & Plan:   Principal Problem:   Abdominal distention  Assessment and Plan:   Abdominal distention/N/V with concern for possible lymphoma - Seen by GI outpatient on 03/27/2024 with plans to follow-up with hematology/oncology and further blood work was to be obtained at that time, however due to worsening symptoms he has been recommended to come to the ED - Carb modified diet - IV fluid hydration - Symptomatic management with IV pain medications as well as IV antiemetics - Repeat GI evaluation with likely endoscopy on 11/21   Mild AKI-resolved - Likely in the setting of dehydration - Baseline creatinine around 0.9-1 - Hold further IV fluid and monitor   Type 2 diabetes -A1c 6.6% - Advance to carb modified diet - No significant hyperglycemia maintain on SSI - Hold metformin  and Jardiance    Hypertension - Continue amlodipine  and labetalol  - IV hydralazine  as needed for severe elevations - Hold losartan /HCTZ   Dyslipidemia - Continue statin   GERD - IV PPI daily   Obesity, class I - BMI 30.08    DVT  prophylaxis:SCDs Code Status: DNR Family Communication: Daughter at bedside 11/20 Disposition Plan:  Status is: Observation The patient will require care spanning > 2 midnights and should be moved to inpatient because: Further IR/oncology investigation.  Consultants:  GI IR Oncology  Procedures:  Colonoscopy 11/21  Antimicrobials:  None   Subjective: Patient seen and evaluated today with no new acute complaints or concerns. No acute concerns or events noted overnight.  Objective: Vitals:   04/05/24 0400 04/05/24 1317 04/05/24 1412 04/05/24 1415  BP: 130/63 (!) 156/67 (!) 111/56 (!) 121/56  Pulse: 91 99 90 93  Resp: 20 (!) 21 (!) 21 (!) 22  Temp: 97.6 F (36.4 C) 98.3 F (36.8 C) 98.4 F (36.9 C)   TempSrc:  Oral    SpO2: 94% 97% 91% 93%  Weight:  92.4 kg    Height:  5' 9 (1.753 m)      Intake/Output Summary (Last 24 hours) at 04/05/2024 1440 Last data filed at 04/05/2024 1409 Gross per 24 hour  Intake 1411.44 ml  Output --  Net 1411.44 ml   Filed Weights   04/04/24 0922 04/05/24 1317  Weight: 92.4 kg 92.4 kg    Examination:  General exam: Appears calm and comfortable  Respiratory system: Clear to auscultation. Respiratory effort normal. Cardiovascular system: S1 & S2 heard, RRR.  Gastrointestinal system: Abdomen is soft Central nervous system: Alert and awake Extremities: No edema Skin: No significant lesions noted Psychiatry: Flat affect.    Data Reviewed: I have personally reviewed following labs and imaging studies  CBC: Recent Labs  Lab 04/04/24 0930 04/05/24 0455  WBC 3.8*  3.5*  NEUTROABS 1.4*  --   HGB 10.9* 10.5*  HCT 33.3* 31.7*  MCV 82.0 81.3  PLT 150 144*   Basic Metabolic Panel: Recent Labs  Lab 04/04/24 0930 04/05/24 0455  NA 137 137  K 4.5 4.4  CL 100 101  CO2 22 23  GLUCOSE 116* 97  BUN 53* 37*  CREATININE 1.45* 1.27*  CALCIUM  8.8* 8.6*  MG  --  2.7*   GFR: Estimated Creatinine Clearance: 44.3 mL/min (A) (by  C-G formula based on SCr of 1.27 mg/dL (H)). Liver Function Tests: Recent Labs  Lab 04/04/24 0930 04/05/24 0455  AST 68* 57*  ALT 60* 55*  ALKPHOS 81 80  BILITOT 0.7 0.7  PROT 6.1* 5.8*  ALBUMIN 3.8 3.8   Recent Labs  Lab 04/04/24 0930  LIPASE 24   No results for input(s): AMMONIA in the last 168 hours. Coagulation Profile: No results for input(s): INR, PROTIME in the last 168 hours. Cardiac Enzymes: No results for input(s): CKTOTAL, CKMB, CKMBINDEX, TROPONINI in the last 168 hours. BNP (last 3 results) No results for input(s): PROBNP in the last 8760 hours. HbA1C: Recent Labs    04/04/24 0941  HGBA1C 6.6*   CBG: Recent Labs  Lab 04/05/24 0001 04/05/24 0400 04/05/24 0729 04/05/24 1126 04/05/24 1310  GLUCAP 102* 100* 99 103* 92   Lipid Profile: No results for input(s): CHOL, HDL, LDLCALC, TRIG, CHOLHDL, LDLDIRECT in the last 72 hours. Thyroid Function Tests: No results for input(s): TSH, T4TOTAL, FREET4, T3FREE, THYROIDAB in the last 72 hours. Anemia Panel: No results for input(s): VITAMINB12, FOLATE, FERRITIN, TIBC, IRON, RETICCTPCT in the last 72 hours. Sepsis Labs: No results for input(s): PROCALCITON, LATICACIDVEN in the last 168 hours.  No results found for this or any previous visit (from the past 240 hours).       Radiology Studies: CT ABDOMEN PELVIS W CONTRAST Result Date: 04/04/2024 CLINICAL DATA:  Bowel obstruction suspected recent enteritis, persistent distention. Abdominal distension with urinary retention and weakness. Recent antibiotic therapy for abdominal infection (suspected colitis). EXAM: CT ABDOMEN AND PELVIS WITH CONTRAST TECHNIQUE: Multidetector CT imaging of the abdomen and pelvis was performed using the standard protocol following bolus administration of intravenous contrast. RADIATION DOSE REDUCTION: This exam was performed according to the departmental dose-optimization program  which includes automated exposure control, adjustment of the mA and/or kV according to patient size and/or use of iterative reconstruction technique. CONTRAST:  80mL OMNIPAQUE  IOHEXOL  300 MG/ML  SOLN COMPARISON:  Prior CTs 03/24/2024 and 07/19/2013. FINDINGS: Lower chest: Clear lung bases. No significant pleural or pericardial effusion. There is atherosclerosis of the aorta and coronary arteries. Prominent right pericardiac node measures 1.7 cm on image 13/2. Hepatobiliary: The liver has a non cirrhotic morphology without suspicious focal abnormality. There is a probable small cyst posteriorly in the right lobe measuring 1.8 cm on image 23/2. This is unchanged from the recent prior study and decreased in size from the 2015 study. Oval focus of enhancement in the dome of the right hepatic lobe measuring 2.1 cm on image 19/2 is unchanged from the recent study, probably an incidental venous anomaly. Gallbladder wall thickening, as before, likely reactive. No evidence of calcified gallstones. No significant biliary dilatation. Pancreas: Fatty atrophy of the pancreatic body and tail. There is ill-defined soft tissue around the pancreatic head. Spleen: Normal in size without focal abnormality. Adrenals/Urinary Tract: Both adrenal glands appear normal. No evidence of urinary tract calculus, suspicious renal lesion or hydronephrosis. Early delayed post-contrast images demonstrate no  significant contrast excretion from either kidney. There is mild symmetric perinephric soft tissue stranding bilaterally. Probable chronic bladder diverticula. No acute abnormality of the urinary bladder demonstrated. Stomach/Bowel: No enteric contrast administered. The stomach appears unremarkable for its degree of distention. There is wall thickening of the transverse duodenum without significant small bowel distension. Interval increased wall thickening of the terminal ileum. Persistent wall thickening of the ascending and transverse colon.  Diffuse diverticulosis of the descending and sigmoid colon without acute inflammation. Vascular/Lymphatic: Prominent left periaortic node measuring 2.1 cm on image 41/2. There is ill-defined soft tissue density throughout the base of the mesentery which has increased compared with the previous prior study, including a component measuring approximately 10.9 x 6.0 cm on image 53/2. This encases the portal and superior mesenteric veins which remain patent. Mildly enlarged inguinal lymph nodes, measuring up to 1.5 cm short axis on image 91/2. Aortic and branch vessel atherosclerosis without evidence of aneurysm or large vessel occlusion. Reproductive: The prostate gland and seminal vesicles appear normal. Other: New perihepatic and perisplenic ascites with fluid in both pericolic gutters. As above, increasing ill-defined soft tissue density throughout the base of the mesentery with diffuse stranding of the mesenteric fat. No focal fluid collection or pneumoperitoneum demonstrated. Small umbilical hernia. Musculoskeletal: No acute or significant osseous findings. Multilevel spondylosis. IMPRESSION: 1. Interval increased ill-defined soft tissue density throughout the base of the mesentery with diffuse stranding of the mesenteric fat and new ascites. Findings are nonspecific, and potentially still inflammatory, but suspicious for lymphoma given the associated enlarged pericardiac, retroperitoneal and inguinal lymph nodes. 2. Persistent wall thickening of the ascending and transverse colon with increased wall thickening of the terminal ileum. These findings could be secondary to infection, inflammatory bowel disease or lymphedema. No evidence of bowel obstruction or perforation. 3. No evidence of urinary tract calculus or hydronephrosis. 4.  Aortic Atherosclerosis (ICD10-I70.0). Electronically Signed   By: Elsie Perone M.D.   On: 04/04/2024 12:26   DG ABD ACUTE 2+V W 1V CHEST Result Date: 04/04/2024 CLINICAL DATA:   Abdominal distension. EXAM: DG ABDOMEN ACUTE WITH 1 VIEW CHEST COMPARISON:  Radiographs 03/27/2024.  CT 03/24/2024. FINDINGS: Supine and erect views of the abdomen demonstrate mild motion artifact and chronic elevation of the right hemidiaphragm. There is a normal nonobstructive bowel gas pattern and no evidence of pneumoperitoneum. No suspicious abdominal calcifications are identified. Multilevel thoracolumbar spondylosis. IMPRESSION: No radiographic evidence of acute abdominal process. Electronically Signed   By: Elsie Perone M.D.   On: 04/04/2024 10:56        Scheduled Meds:  [MAR Hold] feeding supplement  1 Container Oral TID BM   [MAR Hold] insulin  aspart  0-9 Units Subcutaneous Q4H   [MAR Hold] pantoprazole  (PROTONIX ) IV  40 mg Intravenous Q24H   Continuous Infusions:  sodium chloride      lactated ringers        LOS: 0 days    Time spent: 55 minutes    Benito Lemmerman JONETTA Fairly, DO [facility]  If 7PM-7AM, please contact night-coverage [email or web address] 04/05/2024, 2:40 PM

## 2024-04-05 NOTE — Plan of Care (Signed)
   Problem: Education: Goal: Knowledge of General Education information will improve Description Including pain rating scale, medication(s)/side effects and non-pharmacologic comfort measures Outcome: Progressing

## 2024-04-05 NOTE — Anesthesia Postprocedure Evaluation (Signed)
 Anesthesia Post Note  Patient: Cody Palmer  Procedure(s) Performed: COLONOSCOPY  Patient location during evaluation: PACU Anesthesia Type: General Level of consciousness: awake and alert Pain management: pain level controlled Vital Signs Assessment: post-procedure vital signs reviewed and stable Respiratory status: spontaneous breathing, nonlabored ventilation and respiratory function stable Cardiovascular status: stable Anesthetic complications: no   There were no known notable events for this encounter.   Last Vitals:  Vitals:   04/05/24 1412 04/05/24 1415  BP: (!) 111/56 (!) 121/56  Pulse: 90 93  Resp: (!) 21 (!) 22  Temp: 36.9 C   SpO2: 91% 93%    Last Pain:  Vitals:   04/05/24 1412  TempSrc:   PainSc: 0-No pain                 Kamare Caspers L Kamyrah Feeser

## 2024-04-05 NOTE — Interval H&P Note (Signed)
 History and Physical Interval Note:  04/05/2024 1:40 PM  Cody Palmer  has presented today for surgery, with the diagnosis of Colon wall thickening.  The various methods of treatment have been discussed with the patient and family. After consideration of risks, benefits and other options for treatment, the patient has consented to  Procedure(s): COLONOSCOPY (N/A) as a surgical intervention.  The patient's history has been reviewed, patient examined, no change in status, stable for surgery.  I have reviewed the patient's chart and labs.  Questions were answered to the patient's satisfaction.     [name]

## 2024-04-05 NOTE — Progress Notes (Signed)
 Initial Nutrition Assessment  DOCUMENTATION CODES:   Not applicable  INTERVENTION:   Boost Breeze po TID while on clear liquids, each supplement provides 250 kcal and 9 grams of protein  When diet advanced, add: MVI with minerals daily Magic cup TID with meals, each supplement provides 290 kcal and 9 grams of protein Ensure Plus High Protein po BID, each supplement provides 350 kcal and 20 grams of protein  NUTRITION DIAGNOSIS:   Inadequate oral intake related to altered GI function as evidenced by NPO status.  GOAL:   Patient will meet greater than or equal to 90% of their needs  MONITOR:   Diet advancement, PO intake, Supplement acceptance  REASON FOR ASSESSMENT:   Rounds    ASSESSMENT:   89 yo male admitted with abdominal distention, N/V, concern for possible lymphoma. PMH includes HTN, HLD, DM, skin cancer.  Patient is currently on clear liquids. Patient reports recent poor intake x 2-3 weeks d/t abdominal pain and nausea. He thinks he has lost about 5 lbs.   Labs reviewed.  A1C 6.6 CBG: 102-100-99  Medications reviewed and include novolog , protonix .  Weight history reviewed.  3% weight loss over the past 2 weeks. Patient with some LE edema, which could be masking actual weight loss and further muscle depletion.   NUTRITION - FOCUSED PHYSICAL EXAM:  Flowsheet Row Most Recent Value  Orbital Region No depletion  Upper Arm Region No depletion  Thoracic and Lumbar Region No depletion  Buccal Region No depletion  Temple Region No depletion  Clavicle Bone Region No depletion  Clavicle and Acromion Bone Region No depletion  Scapular Bone Region No depletion  Dorsal Hand No depletion  Patellar Region Mild depletion  Anterior Thigh Region Mild depletion  Posterior Calf Region No depletion  Edema (RD Assessment) Mild  Hair Reviewed  Eyes Reviewed  Mouth Reviewed  Skin Reviewed  Nails Reviewed    Diet Order:   Diet Order             Diet NPO time  specified Except for: Sips with Meds  Diet effective midnight           Diet clear liquid Room service appropriate? Yes; Fluid consistency: Thin  Diet effective now                   EDUCATION NEEDS:   No education needs have been identified at this time  Skin:  Skin Assessment: Reviewed RN Assessment  Last BM:  11/21  Height:   Ht Readings from Last 1 Encounters:  04/04/24 5' 9 (1.753 m)    Weight:   Wt Readings from Last 1 Encounters:  04/04/24 92.4 kg    Ideal Body Weight:  72.7 kg  BMI:  Body mass index is 30.08 kg/m.  Estimated Nutritional Needs:   Kcal:  1900-2100  Protein:  95-110 gm  Fluid:  1.9-2.1 L   Suzen HUNT RD, LDN, CNSC Contact via secure chat. If unavailable, use group chat RD Inpatient.

## 2024-04-05 NOTE — TOC CM/SW Note (Signed)
 Transition of Care [facility]) - Inpatient Brief Assessment   Patient Details  Name: Cody Palmer MRN: [id number] Date of Birth: July 27, 1932  Transition of [facility]) CM/SW Contact:    Lucie Lunger, LCSWA Phone Number: 04/05/2024, 8:53 AM   Clinical Narrative: Transition of Care Department [facility]) has reviewed patient and no TOC needs have been identified at this time. We will continue to monitor patient advancement through interdiciplinary progression rounds. If new patient transition needs arise, please place a TOC consult.  Transition of Care Asessment: Insurance and Status: Insurance coverage has been reviewed Patient has primary care physician: Yes Home environment has been reviewed: From home Prior level of function:: Independent Prior/Current Home Services: No current home services Social Drivers of Health Review: SDOH reviewed no interventions necessary Readmission risk has been reviewed: Yes Transition of care needs: no transition of care needs at this time

## 2024-04-05 NOTE — Anesthesia Preprocedure Evaluation (Addendum)
 Anesthesia Evaluation  Patient identified by MRN, date of birth, ID band Patient awake    Reviewed: Allergy & Precautions, H&P , NPO status , Patient's Chart, lab work & pertinent test results, reviewed documented beta blocker date and time   Airway Mallampati: II  TM Distance: >3 FB Neck ROM: Full    Dental  (+) Dental Advisory Given, Partial Lower, Partial Upper, Missing,    Pulmonary former smoker   Pulmonary exam normal breath sounds clear to auscultation       Cardiovascular hypertension, Pt. on medications and Pt. on home beta blockers Normal cardiovascular exam Rhythm:Regular Rate:Normal  07-Jul-2022 12:57:29 [facility]-OPS ROUTINE RECORD 04-Sep-1932 (75 yr) Male Caucasian Room: Loc:905 Technician: RSM Test ind: Vent. rate 68 BPM PR interval 208 ms QRS duration 102 ms QT/QTcB 416/442 ms P-R-T axes 24 -22 35 Normal sinus rhythm Normal ECG When compared with ECG of 19-Apr-2016 14:58, No significant change since last tracing PREVIOUS ECG IS PRESENT Confirmed by Sheryle Carwin [phone]) on 07/07/2022 9:01:36 PM   Neuro/Psych negative neurological ROS  negative psych ROS   GI/Hepatic negative GI ROS, Neg liver ROS,,,  Endo/Other  diabetes, Well Controlled, Type 2, Oral Hypoglycemic Agents    Renal/GU Renal InsufficiencyRenal diseaseMild renal insufficiency  negative genitourinary   Musculoskeletal  (+) Arthritis , Osteoarthritis,    Abdominal   Peds negative pediatric ROS (+)  Hematology  (+) Blood dyscrasia, anemia Hgb 10.5   Anesthesia Other Findings cancer  Reproductive/Obstetrics negative OB ROS                              Anesthesia Physical Anesthesia Plan  ASA: 3  Anesthesia Plan: General   Post-op Pain Management: Minimal or no pain anticipated   Induction:   PONV Risk Score and Plan: Propofol  infusion  Airway Management Planned: Nasal Cannula and  Natural Airway  Additional Equipment: None  Intra-op Plan:   Post-operative Plan:   Informed Consent: I have reviewed the patients History and Physical, chart, labs and discussed the procedure including the risks, benefits and alternatives for the proposed anesthesia with the patient or authorized representative who has indicated his/her understanding and acceptance.     Dental advisory given  Plan Discussed with: CRNA  Anesthesia Plan Comments: (Dr. Margrette doesn't want regional block for this patient.)         Anesthesia Quick Evaluation

## 2024-04-05 NOTE — Transfer of Care (Signed)
 Immediate Anesthesia Transfer of Care Note  Patient: Cody Palmer  Procedure(s) Performed: COLONOSCOPY  Patient Location: PACU  Anesthesia Type:General  Level of Consciousness: awake  Airway & Oxygen Therapy: Patient Spontanous Breathing  Post-op Assessment: Report given to RN and Post -op Vital signs reviewed and stable  Post vital signs: Reviewed and stable  Last Vitals:  Vitals Value Taken Time  BP 111/56 04/05/24 14:12  Temp 36.9 C 04/05/24 14:12  Pulse 90 04/05/24 14:12  Resp 21 04/05/24 14:12  SpO2 91 % 04/05/24 14:12    Last Pain:  Vitals:   04/05/24 1348  TempSrc:   PainSc: 0-No pain         Complications: No notable events documented.

## 2024-04-05 NOTE — Consult Note (Signed)
 [facility] Consultation Hematology/Oncology  CONSULTING PHYSICIAN: Dr. Maree  REASON FOR CONSULT: Abdominal mass    HISTORY OF PRESENT ILLNESS:   Cody Palmer is a 89 y.o. male with past medical history of hypertension, type 2 diabetes, hyperlipidemia, CKD and severe enteritis who presented to the emergency room with progressive weakness, abdominal distention, urinary retention, poor oral intake and nausea.  GI was consulted and patient had colonoscopy today which shows concerns for abdominal lymphoma.  He had a CT abdomen/pelvis yesterday which showed ill-defined soft tissue density throughout the base of the mesentery with diffuse stranding of mesenteric fat and new ascites.  Findings are nonspecific and potentially still inflammatory but suspicious for lymphoma given the associated enlarged pericardiac, retroperitoneal and inguinal lymph nodes.  CT chest is ordered but has not been completed yet.  An order has already been placed for biopsy that will hopefully be arranged prior to discharge.  Reports appetite has been low over the past 3 to 4 weeks.  He has lost about 10 pounds since then.  Reports abdominal bloating.  MEDICATIONS: I have reviewed the patient's current medications.     PERFORMANCE STATUS: The patient's performance status is 1 - Symptomatic but completely ambulatory  PHYSICAL EXAM: Most Recent Vital Signs: Blood pressure (!) 134/103, pulse 92, temperature 97.8 F (36.6 C), temperature source Oral, resp. rate 12, height 6' (1.829 m), weight 270 lb (122.5 kg), SpO2 91%.  GENERAL:alert, no distress and comfortable SKIN: skin color, texture, turgor are normal, no rashes or significant lesions EYES: normal, conjunctiva are pink and non-injected, sclera clear OROPHARYNX:no exudate, no erythema and lips, buccal mucosa, and tongue normal  NECK: supple, thyroid normal size, non-tender, without nodularity LYMPH:  no palpable lymphadenopathy in the cervical, axillary or  inguinal LUNGS: clear to auscultation and percussion with normal breathing effort HEART: regular rate & rhythm and no murmurs and no lower extremity edema ABDOMEN:abdomen soft, non-tender and normal bowel sounds Musculoskeletal:no cyanosis of digits and no clubbing  PSYCH: alert & oriented x 3 with fluent speech NEURO: no focal motor/sensory deficits    LABORATORY DATA:   Last CBC Lab Results  Component Value Date   WBC 3.5 (L) 04/05/2024   HGB 10.5 (L) 04/05/2024   HCT 31.7 (L) 04/05/2024   MCV 81.3 04/05/2024   MCH 26.9 04/05/2024   RDW 16.1 (H) 04/05/2024   PLT 144 (L) 04/05/2024     Last metabolic panel Lab Results  Component Value Date   GLUCOSE 97 04/05/2024   NA 137 04/05/2024   K 4.4 04/05/2024   CL 101 04/05/2024   CO2 23 04/05/2024   BUN 37 (H) 04/05/2024   CREATININE 1.27 (H) 04/05/2024   GFRNONAA 54 (L) 04/05/2024   CALCIUM  8.6 (L) 04/05/2024   PROT 5.8 (L) 04/05/2024   ALBUMIN 3.8 04/05/2024   LABGLOB 2.4 03/27/2024   BILITOT 0.7 04/05/2024   ALKPHOS 80 04/05/2024   AST 57 (H) 04/05/2024   ALT 55 (H) 04/05/2024   ANIONGAP 13 04/05/2024      RADIOGRAPHY: CT ABDOMEN PELVIS W CONTRAST CLINICAL DATA:  Bowel obstruction suspected recent enteritis, persistent distention.  Abdominal distension with urinary retention and weakness. Recent antibiotic therapy for abdominal infection (suspected colitis).  EXAM: CT ABDOMEN AND PELVIS WITH CONTRAST  TECHNIQUE: Multidetector CT imaging of the abdomen and pelvis was performed using the standard protocol following bolus administration of intravenous contrast.  RADIATION DOSE REDUCTION: This exam was performed according to the departmental dose-optimization program which includes automated  exposure control, adjustment of the mA and/or kV according to patient size and/or use of iterative reconstruction technique.  CONTRAST:  80mL OMNIPAQUE  IOHEXOL  300 MG/ML  SOLN  COMPARISON:  Prior CTs 03/24/2024 and  07/19/2013.  FINDINGS: Lower chest: Clear lung bases. No significant pleural or pericardial effusion. There is atherosclerosis of the aorta and coronary arteries. Prominent right pericardiac node measures 1.7 cm on image 13/2.  Hepatobiliary: The liver has a non cirrhotic morphology without suspicious focal abnormality. There is a probable small cyst posteriorly in the right lobe measuring 1.8 cm on image 23/2. This is unchanged from the recent prior study and decreased in size from the 2015 study. Oval focus of enhancement in the dome of the right hepatic lobe measuring 2.1 cm on image 19/2 is unchanged from the recent study, probably an incidental venous anomaly. Gallbladder wall thickening, as before, likely reactive. No evidence of calcified gallstones. No significant biliary dilatation.  Pancreas: Fatty atrophy of the pancreatic body and tail. There is ill-defined soft tissue around the pancreatic head.  Spleen: Normal in size without focal abnormality.  Adrenals/Urinary Tract: Both adrenal glands appear normal. No evidence of urinary tract calculus, suspicious renal lesion or hydronephrosis. Early delayed post-contrast images demonstrate no significant contrast excretion from either kidney. There is mild symmetric perinephric soft tissue stranding bilaterally. Probable chronic bladder diverticula. No acute abnormality of the urinary bladder demonstrated.  Stomach/Bowel: No enteric contrast administered. The stomach appears unremarkable for its degree of distention. There is wall thickening of the transverse duodenum without significant small bowel distension. Interval increased wall thickening of the terminal ileum. Persistent wall thickening of the ascending and transverse colon. Diffuse diverticulosis of the descending and sigmoid colon without acute inflammation.  Vascular/Lymphatic: Prominent left periaortic node measuring 2.1 cm on image 41/2. There is ill-defined  soft tissue density throughout the base of the mesentery which has increased compared with the previous prior study, including a component measuring approximately 10.9 x 6.0 cm on image 53/2. This encases the portal and superior mesenteric veins which remain patent. Mildly enlarged inguinal lymph nodes, measuring up to 1.5 cm short axis on image 91/2. Aortic and branch vessel atherosclerosis without evidence of aneurysm or large vessel occlusion.  Reproductive: The prostate gland and seminal vesicles appear normal.  Other: New perihepatic and perisplenic ascites with fluid in both pericolic gutters. As above, increasing ill-defined soft tissue density throughout the base of the mesentery with diffuse stranding of the mesenteric fat. No focal fluid collection or pneumoperitoneum demonstrated. Small umbilical hernia.  Musculoskeletal: No acute or significant osseous findings. Multilevel spondylosis.  IMPRESSION: 1. Interval increased ill-defined soft tissue density throughout the base of the mesentery with diffuse stranding of the mesenteric fat and new ascites. Findings are nonspecific, and potentially still inflammatory, but suspicious for lymphoma given the associated enlarged pericardiac, retroperitoneal and inguinal lymph nodes. 2. Persistent wall thickening of the ascending and transverse colon with increased wall thickening of the terminal ileum. These findings could be secondary to infection, inflammatory bowel disease or lymphedema. No evidence of bowel obstruction or perforation. 3. No evidence of urinary tract calculus or hydronephrosis. 4.  Aortic Atherosclerosis (ICD10-I70.0).  Electronically Signed   By: Elsie Perone M.D.   On: 04/04/2024 12:26 DG ABD ACUTE 2+V W 1V CHEST CLINICAL DATA:  Abdominal distension.  EXAM: DG ABDOMEN ACUTE WITH 1 VIEW CHEST  COMPARISON:  Radiographs 03/27/2024.  CT 03/24/2024.  FINDINGS: Supine and erect views of the abdomen  demonstrate mild motion artifact and chronic  elevation of the right hemidiaphragm. There is a normal nonobstructive bowel gas pattern and no evidence of pneumoperitoneum. No suspicious abdominal calcifications are identified. Multilevel thoracolumbar spondylosis.  IMPRESSION: No radiographic evidence of acute abdominal process.  Electronically Signed   By: Elsie Perone M.D.   On: 04/04/2024 10:56      ASSESSMENT: Calvin Jablonowski is an 89 year old male who presented for abdominal bloating and found to have abdominal mass concerning for lymphoma.  PLAN:  Spoke with Dr. Davonna who recommends biopsy, LDH, flow cytometry and uric acid.   Recommend nutritional panel.  Agree with CT chest.  LDH is extremely elevated.  Flow cytometry is pending. Uric Acid to r/o TLS.   Will follow-up on labs. Agree with CT-guided biopsy and follow-up with oncology outpatient.  Will wait and see when CT-guided biopsy is completed to arrange for follow-up. Will arrange for follow-up with Dr. Davonna next week.   Thank you for involving us  in this patient's care.  Please to reach out with any questions or concerns.  Delon Hope, AGNP-C Department of Hematology/Oncology [facility]  Phone: [phone]  04/05/2024 2:36 PM

## 2024-04-06 DIAGNOSIS — R140 Abdominal distension (gaseous): Secondary | ICD-10-CM | POA: Diagnosis not present

## 2024-04-06 LAB — COMPREHENSIVE METABOLIC PANEL WITH GFR
ALT: 43 U/L (ref 0–44)
AST: 47 U/L — ABNORMAL HIGH (ref 15–41)
Albumin: 3.5 g/dL (ref 3.5–5.0)
Alkaline Phosphatase: 74 U/L (ref 38–126)
Anion gap: 12 (ref 5–15)
BUN: 27 mg/dL — ABNORMAL HIGH (ref 8–23)
CO2: 24 mmol/L (ref 22–32)
Calcium: 8.3 mg/dL — ABNORMAL LOW (ref 8.9–10.3)
Chloride: 105 mmol/L (ref 98–111)
Creatinine, Ser: 1.11 mg/dL (ref 0.61–1.24)
GFR, Estimated: >60 mL/min (ref >60)
Glucose, Bld: 77 mg/dL (ref 70–99)
Potassium: 4.2 mmol/L (ref 3.5–5.1)
Sodium: 141 mmol/L (ref 135–145)
Total Bilirubin: 0.5 mg/dL (ref 0.0–1.2)
Total Protein: 5.2 g/dL — ABNORMAL LOW (ref 6.5–8.1)

## 2024-04-06 LAB — CBC
HCT: 31.8 % — ABNORMAL LOW (ref 39.0–52.0)
Hemoglobin: 10.3 g/dL — ABNORMAL LOW (ref 13.0–17.0)
MCH: 26.9 pg (ref 26.0–34.0)
MCHC: 32.4 g/dL (ref 30.0–36.0)
MCV: 83 fL (ref 80.0–100.0)
Platelets: 141 K/uL — ABNORMAL LOW (ref 150–400)
RBC: 3.83 MIL/uL — ABNORMAL LOW (ref 4.22–5.81)
RDW: 16.2 % — ABNORMAL HIGH (ref 11.5–15.5)
WBC: 3.2 K/uL — ABNORMAL LOW (ref 4.0–10.5)
nRBC: 0 % (ref 0.0–0.2)

## 2024-04-06 LAB — GLUCOSE, CAPILLARY
Glucose-Capillary: 92 mg/dL (ref 70–99)
Glucose-Capillary: 92 mg/dL (ref 70–99)
Glucose-Capillary: 90 mg/dL (ref 70–99)

## 2024-04-06 LAB — MAGNESIUM: Magnesium: 2.5 mg/dL — ABNORMAL HIGH (ref 1.7–2.4)

## 2024-04-06 MED ORDER — ONDANSETRON HCL 4 MG PO TABS: 4.0000 mg | ORAL_TABLET | Freq: Every day | ORAL | 1 refills | Status: DC | PRN

## 2024-04-06 MED ORDER — POLYETHYLENE GLYCOL 3350 17 G PO PACK: 17.0000 g | PACK | Freq: Every day | ORAL | 0 refills | Status: DC | PRN

## 2024-04-06 MED ORDER — SIMETHICONE 80 MG PO CHEW: 80.0000 mg | CHEWABLE_TABLET | Freq: Four times a day (QID) | ORAL | 0 refills | Status: AC | PRN

## 2024-04-06 NOTE — Progress Notes (Signed)
 Request to IR for possible biopsy for tissue diagnosis. Patient and imaging reviewed by Dr. Philip and Dr. Hughes who note that the left RP lymph node vs left inguinal lymph node are potentially approachable but recommend outpatient work up with PET scan first for further evaluation. Once PET scan is completed IR will review and determine best site for potential biopsy.  - Discussed above with hospitalist, he will contact oncology regarding outpatient PET - Order placed for outpatient US  guided lymph node biopsy pending review of PET scan by IR. Once approved, IR scheduler will call patient to setup appointment date/time. Biopsies are done at [facility], [facility] or [facility].   IR remains available as needed, please call with questions or concerns.  Clotilda Hesselbach, PA-C

## 2024-04-06 NOTE — Plan of Care (Signed)

## 2024-04-06 NOTE — Discharge Summary (Signed)
 Physician Discharge Summary  Cody Palmer FMW:[id number] DOB: June 11, 1932 DOA: 04/04/2024  PCP: [name], NP  Admit date: 04/04/2024  Discharge date: 04/06/2024  Admitted From:Home  Disposition:  Home  Recommendations for Outpatient Follow-up:  Follow up with oncology/IR for scheduling of PET/CT outpatient with plans for possible left retroperitoneal lymph node biopsy Remain on symptomatic management with simethicone , Zofran , and MiraLAX  and ambulate aggressively as well as remain on small and more frequent meals-discussed with daughter Continue other home medications as prior  Home Health: None  Equipment/Devices: None  Discharge Condition:Stable  CODE STATUS: DNR  Diet recommendation: Heart Healthy/carb modified  Brief/Interim Summary: Cody Palmer is a 89 y.o. male with medical history significant for type 2 diabetes, hypertension, dyslipidemia, osteoarthritis, and GERD who presented to the ED with complaints of nausea and with poor oral tolerance as well as abdominal discomfort and distention.  He apparently was undergoing workup for chronic abdominal pain and generalized symptoms with last GI visit on 03/27/2024 with CT demonstrating concerns for lymphoma process in his abdomen.  Patient was admitted for evaluation of abdominal distention with possible concern for lymphoma and has undergone colonoscopy on 11/21 with overall healthy looking colon and no evidence of malignancy in 6 small polyps were removed.  Oncology has ordered further workup and will follow-up outpatient with plans to schedule PET scan and IR was scheduled for lymph node biopsy subsequently.  He appears to be otherwise tolerating a diet and AKI has resolved with IV fluid hydration.  He continues to have some mild abdominal distention and will need to remain on some symptomatic management in the interim.  No other acute events or concerns noted.  Discharge Diagnoses:  Principal Problem:    Abdominal distention  Principal discharge diagnosis: Abdominal distention with concerns for lymphoma.  AKI-in the setting of poor oral intake.  Discharge Instructions  Discharge Instructions     Ambulatory referral to Hematology / Oncology   Complete by: As directed    Diet - low sodium heart healthy   Complete by: As directed    Increase activity slowly   Complete by: As directed       Allergies as of 04/06/2024   No Known Allergies      Medication List     STOP taking these medications    amoxicillin -clavulanate 875-125 MG tablet Commonly known as: AUGMENTIN        TAKE these medications    amLODipine  10 MG tablet Commonly known as: NORVASC  Take 10 mg by mouth daily.   aspirin  EC 81 MG tablet Take 81 mg by mouth daily. Swallow whole.   EYE HEALTH AREDS 2 PO Take by mouth 2 (two) times daily.   Jardiance  25 MG Tabs tablet Generic drug: empagliflozin  Take 12.5 mg by mouth daily.   labetalol  300 MG tablet Commonly known as: NORMODYNE  Take 300 mg by mouth 2 (two) times daily.   losartan -hydrochlorothiazide  100-25 MG tablet Commonly known as: HYZAAR Take 1 tablet by mouth daily.   metFORMIN  500 MG tablet Commonly known as: GLUCOPHAGE  Take 500 mg by mouth 2 (two) times daily with a meal.   ondansetron  4 MG tablet Commonly known as: Zofran  Take 1 tablet (4 mg total) by mouth daily as needed for nausea or vomiting.   pantoprazole  40 MG tablet Commonly known as: PROTONIX  Take 1 tablet (40 mg total) by mouth daily. 30 minutes before breakfast   polyethylene glycol 17 g packet Commonly known as: MiraLax  Take 17 g by mouth daily  as needed for mild constipation or moderate constipation.   rosuvastatin  10 MG tablet Commonly known as: CRESTOR  Take 10 mg by mouth every other day.   simethicone  80 MG chewable tablet Commonly known as: MYLICON Chew 1 tablet (80 mg total) by mouth every 6 (six) hours as needed for flatulence.   Vitamin D3 25 MCG (1000  UT) Caps Take 1,000 Units by mouth daily.        Follow-up Information     [name], Kalom[place] Cesar, NP. Schedule an appointment as soon as possible for a visit in 1 week(s).   Contact information: [address] [place] [address] [phone]                No Known Allergies  Consultations: IR GI Oncology   Procedures/Studies: CT CHEST W CONTRAST Result Date: 04/05/2024 EXAM: CT CHEST WITH CONTRAST 04/05/2024 03:37:22 PM TECHNIQUE: CT of the chest was performed with the administration of 75 mL of iohexol  (OMNIPAQUE ) 300 MG/ML solution. Multiplanar reformatted images are provided for review. Automated exposure control, iterative reconstruction, and/or weight based adjustment of the mA/kV was utilized to reduce the radiation dose to as low as reasonably achievable. COMPARISON: CT 04/04/2024 and CT abdomen and pelvis 1 day prior. CLINICAL HISTORY: Lymphadenopathy on recent CT abdomen - needs CT chest to assess for further lymphadenopathy/possible biopsy in IR. * Tracking Code: BO * FINDINGS: MEDIASTINUM: Heart is unremarkable. No pericardial fluid. The central airways are clear. LYMPH NODES: Bilateral small axillary lymph nodes. No Supraclavicular lymph nodes. No enlargement of the mediastinal lymph nodes. No hilar lymphadenopathy. LUNGS AND PLEURA: No focal consolidation or pulmonary edema. No pleural effusion or pneumothorax. No suspicious pulmonary nodules. Mild thickening along the pleural surface of the fissures. SOFT TISSUES/BONES: No aggressive osseous lesion. No acute abnormality of the soft tissues. UPPER ABDOMEN: Limited view of the upper abdomen demonstrates infiltrative process in the upper abdominal mesentery as seen on recent CT of the abdomen and pelvis. Findings suggestive of carcinomatosis. A small enhancing lesion in the right hepatic lobe on image 119 is also described on comparison CT. IMPRESSION: 1. No evidence of thoracic metastasis. 2.  Infiltrative process in the upper abdominal mesentery suggestive of peritoneal carcinomatosis. 3. See CT abdomen and pelvis 1 day prior. Electronically signed by: Norleen Boxer MD 04/05/2024 03:54 PM EST RP Workstation: [id number]   CT ABDOMEN PELVIS W CONTRAST Result Date: 04/04/2024 CLINICAL DATA:  Bowel obstruction suspected recent enteritis, persistent distention. Abdominal distension with urinary retention and weakness. Recent antibiotic therapy for abdominal infection (suspected colitis). EXAM: CT ABDOMEN AND PELVIS WITH CONTRAST TECHNIQUE: Multidetector CT imaging of the abdomen and pelvis was performed using the standard protocol following bolus administration of intravenous contrast. RADIATION DOSE REDUCTION: This exam was performed according to the departmental dose-optimization program which includes automated exposure control, adjustment of the mA and/or kV according to patient size and/or use of iterative reconstruction technique. CONTRAST:  80mL OMNIPAQUE  IOHEXOL  300 MG/ML  SOLN COMPARISON:  Prior CTs 03/24/2024 and 07/19/2013. FINDINGS: Lower chest: Clear lung bases. No significant pleural or pericardial effusion. There is atherosclerosis of the aorta and coronary arteries. Prominent right pericardiac node measures 1.7 cm on image 13/2. Hepatobiliary: The liver has a non cirrhotic morphology without suspicious focal abnormality. There is a probable small cyst posteriorly in the right lobe measuring 1.8 cm on image 23/2. This is unchanged from the recent prior study and decreased in size from the 2015 study. Oval focus of enhancement in the dome  of the right hepatic lobe measuring 2.1 cm on image 19/2 is unchanged from the recent study, probably an incidental venous anomaly. Gallbladder wall thickening, as before, likely reactive. No evidence of calcified gallstones. No significant biliary dilatation. Pancreas: Fatty atrophy of the pancreatic body and tail. There is ill-defined soft tissue around  the pancreatic head. Spleen: Normal in size without focal abnormality. Adrenals/Urinary Tract: Both adrenal glands appear normal. No evidence of urinary tract calculus, suspicious renal lesion or hydronephrosis. Early delayed post-contrast images demonstrate no significant contrast excretion from either kidney. There is mild symmetric perinephric soft tissue stranding bilaterally. Probable chronic bladder diverticula. No acute abnormality of the urinary bladder demonstrated. Stomach/Bowel: No enteric contrast administered. The stomach appears unremarkable for its degree of distention. There is wall thickening of the transverse duodenum without significant small bowel distension. Interval increased wall thickening of the terminal ileum. Persistent wall thickening of the ascending and transverse colon. Diffuse diverticulosis of the descending and sigmoid colon without acute inflammation. Vascular/Lymphatic: Prominent left periaortic node measuring 2.1 cm on image 41/2. There is ill-defined soft tissue density throughout the base of the mesentery which has increased compared with the previous prior study, including a component measuring approximately 10.9 x 6.0 cm on image 53/2. This encases the portal and superior mesenteric veins which remain patent. Mildly enlarged inguinal lymph nodes, measuring up to 1.5 cm short axis on image 91/2. Aortic and branch vessel atherosclerosis without evidence of aneurysm or large vessel occlusion. Reproductive: The prostate gland and seminal vesicles appear normal. Other: New perihepatic and perisplenic ascites with fluid in both pericolic gutters. As above, increasing ill-defined soft tissue density throughout the base of the mesentery with diffuse stranding of the mesenteric fat. No focal fluid collection or pneumoperitoneum demonstrated. Small umbilical hernia. Musculoskeletal: No acute or significant osseous findings. Multilevel spondylosis. IMPRESSION: 1. Interval increased  ill-defined soft tissue density throughout the base of the mesentery with diffuse stranding of the mesenteric fat and new ascites. Findings are nonspecific, and potentially still inflammatory, but suspicious for lymphoma given the associated enlarged pericardiac, retroperitoneal and inguinal lymph nodes. 2. Persistent wall thickening of the ascending and transverse colon with increased wall thickening of the terminal ileum. These findings could be secondary to infection, inflammatory bowel disease or lymphedema. No evidence of bowel obstruction or perforation. 3. No evidence of urinary tract calculus or hydronephrosis. 4.  Aortic Atherosclerosis (ICD10-I70.0). Electronically Signed   By: Elsie Perone M.D.   On: 04/04/2024 12:26   DG ABD ACUTE 2+V W 1V CHEST Result Date: 04/04/2024 CLINICAL DATA:  Abdominal distension. EXAM: DG ABDOMEN ACUTE WITH 1 VIEW CHEST COMPARISON:  Radiographs 03/27/2024.  CT 03/24/2024. FINDINGS: Supine and erect views of the abdomen demonstrate mild motion artifact and chronic elevation of the right hemidiaphragm. There is a normal nonobstructive bowel gas pattern and no evidence of pneumoperitoneum. No suspicious abdominal calcifications are identified. Multilevel thoracolumbar spondylosis. IMPRESSION: No radiographic evidence of acute abdominal process. Electronically Signed   By: Elsie Perone M.D.   On: 04/04/2024 10:56   DG Abd 2 Views Result Date: 03/27/2024 CLINICAL DATA:  Diffuse abdominal pain.  Enterocolitis. EXAM: ABDOMEN - 2 VIEW COMPARISON:  CT abdomen pelvis 03/24/2024. FINDINGS: Gas and stool are scattered in nondilated colon. No small bowel dilatation. Degenerative changes in the spine. IMPRESSION: No acute findings. Electronically Signed   By: Newell Eke M.D.   On: 03/27/2024 13:25   CT ABDOMEN PELVIS W CONTRAST Result Date: 03/24/2024 EXAM: CT ABDOMEN AND PELVIS WITH CONTRAST  03/24/2024 12:34:57 PM TECHNIQUE: CT of the abdomen and pelvis was performed  with the administration of 100 mL of iohexol  (OMNIPAQUE ) 300 MG/ML solution. Multiplanar reformatted images are provided for review. Automated exposure control, iterative reconstruction, and/or weight-based adjustment of the mA/kV was utilized to reduce the radiation dose to as low as reasonably achievable. COMPARISON: Comparison with 07/19/2013. CLINICAL HISTORY: Constipation. Laxative not providing relief. FINDINGS: LOWER CHEST: No acute abnormality. LIVER: The liver is unremarkable. GALLBLADDER AND BILE DUCTS: Mild gallbladder wall thickening is favored reactive. No biliary ductal dilatation. SPLEEN: No acute abnormality. PANCREAS: Fatty atrophy of the pancreas. There is relative sparing of the fat about the pancreas from the adjacent inflammatory process. ADRENAL GLANDS: No acute abnormality. KIDNEYS, URETERS AND BLADDER: No stones in the kidneys or ureters. No hydronephrosis. No perinephric or periureteral stranding. Large bladder diverticula about the right dome of the bladder. GI AND BOWEL: Wall thickening about the ascending and transverse colon with marked adjacent inflammatory stranding and fluid. Extensive diverticulosis about the descending and sigmoid colon. Mild stranding about the descending colon. Wall thickening about the gastric antrum, duodenum, and proximal jejunum with adjacent stranding and free fluid. Additional wall thickening and stranding with engorgement of the vasa recta about the ileum in the right lower quadrant. There is no bowel obstruction. PERITONEUM AND RETROPERITONEUM: No free air. No organized fluid collection. VASCULATURE: Aorta is normal in caliber. Aortic atherosclerotic calcification. LYMPH NODES: No lymphadenopathy. REPRODUCTIVE ORGANS: No acute abnormality. BONES AND SOFT TISSUES: No acute osseous abnormality. No focal soft tissue abnormality. IMPRESSION: 1. Severe enterocolitis greatest about the duodenum, ileum, and right colon. No drainable abscess for free  intraperitoneal air. Electronically signed by: Norman Gatlin MD 03/24/2024 01:18 PM EST RP Workstation: [id number]     Discharge Exam: Vitals:   04/05/24 1946 04/06/24 0555  BP: 136/64 122/60  Pulse: 97 64  Resp: 18 18  Temp: 98.4 F (36.9 C) 98.6 F (37 C)  SpO2: 90% 95%   Vitals:   04/05/24 1412 04/05/24 1415 04/05/24 1946 04/06/24 0555  BP: (!) 111/56 (!) 121/56 136/64 122/60  Pulse: 90 93 97 64  Resp: (!) 21 (!) 22 18 18   Temp: 98.4 F (36.9 C)  98.4 F (36.9 C) 98.6 F (37 C)  TempSrc:   Oral Oral  SpO2: 91% 93% 90% 95%  Weight:      Height:        General: Pt is alert, awake, not in acute distress Cardiovascular: RRR, S1/S2 +, no rubs, no gallops Respiratory: CTA bilaterally, no wheezing, no rhonchi Abdominal: Soft, NT, ND, bowel sounds +, mild distention Extremities: no edema, no cyanosis    The results of significant diagnostics from this hospitalization (including imaging, microbiology, ancillary and laboratory) are listed below for reference.     Microbiology: No results found for this or any previous visit (from the past 240 hours).   Labs: BNP (last 3 results) No results for input(s): BNP in the last 8760 hours. Basic Metabolic Panel: Recent Labs  Lab 04/04/24 0930 04/05/24 0455 04/06/24 0435  NA 137 137 141  K 4.5 4.4 4.2  CL 100 101 105  CO2 22 23 24   GLUCOSE 116* 97 77  BUN 53* 37* 27*  CREATININE 1.45* 1.27* 1.11  CALCIUM  8.8* 8.6* 8.3*  MG  --  2.7* 2.5*   Liver Function Tests: Recent Labs  Lab 04/04/24 0930 04/05/24 0455 04/06/24 0435  AST 68* 57* 47*  ALT 60* 55* 43  ALKPHOS 81 80 74  BILITOT 0.7 0.7 0.5  PROT 6.1* 5.8* 5.2*  ALBUMIN 3.8 3.8 3.5   Recent Labs  Lab 04/04/24 0930  LIPASE 24   No results for input(s): AMMONIA in the last 168 hours. CBC: Recent Labs  Lab 04/04/24 0930 04/05/24 0455 04/06/24 0435  WBC 3.8* 3.5* 3.2*  NEUTROABS 1.4*  --   --   HGB 10.9* 10.5* 10.3*  HCT 33.3* 31.7* 31.8*   MCV 82.0 81.3 83.0  PLT 150 144* 141*   Cardiac Enzymes: No results for input(s): CKTOTAL, CKMB, CKMBINDEX, TROPONINI in the last 168 hours. BNP: Invalid input(s): POCBNP CBG: Recent Labs  Lab 04/05/24 1605 04/05/24 2013 04/06/24 0113 04/06/24 0525 04/06/24 0719  GLUCAP 91 140* 92 92 90   D-Dimer No results for input(s): DDIMER in the last 72 hours. Hgb A1c Recent Labs    04/04/24 0941  HGBA1C 6.6*   Lipid Profile No results for input(s): CHOL, HDL, LDLCALC, TRIG, CHOLHDL, LDLDIRECT in the last 72 hours. Thyroid function studies No results for input(s): TSH, T4TOTAL, T3FREE, THYROIDAB in the last 72 hours.  Invalid input(s): FREET3 Anemia work up Recent Labs    04/05/24 1515  VITAMINB12 308  FOLATE 9.8  FERRITIN 317  TIBC 217*  IRON 47   Urinalysis    Component Value Date/Time   COLORURINE YELLOW 04/04/2024 1043   APPEARANCEUR CLEAR 04/04/2024 1043   LABSPEC 1.014 04/04/2024 1043   PHURINE 5.0 04/04/2024 1043   GLUCOSEU >=500 (A) 04/04/2024 1043   HGBUR NEGATIVE 04/04/2024 1043   BILIRUBINUR NEGATIVE 04/04/2024 1043   KETONESUR NEGATIVE 04/04/2024 1043   PROTEINUR NEGATIVE 04/04/2024 1043   UROBILINOGEN 2.0 (H) 07/04/2013 1917   NITRITE NEGATIVE 04/04/2024 1043   LEUKOCYTESUR NEGATIVE 04/04/2024 1043   Sepsis Labs Recent Labs  Lab 04/04/24 0930 04/05/24 0455 04/06/24 0435  WBC 3.8* 3.5* 3.2*   Microbiology No results found for this or any previous visit (from the past 240 hours).   Time coordinating discharge: 35 minutes  SIGNED:   Adron JONETTA Fairly, DO [facility] 04/06/2024, 10:36 AM  If 7PM-7AM, please contact night-coverage [email or web address]

## 2024-04-08 ENCOUNTER — Other Ambulatory Visit: Payer: Self-pay | Admitting: Oncology

## 2024-04-08 DIAGNOSIS — R1909 Other intra-abdominal and pelvic swelling, mass and lump: Secondary | ICD-10-CM

## 2024-04-08 LAB — COPPER, SERUM: Copper: 110 ug/dL (ref 69–132)

## 2024-04-08 LAB — SURGICAL PATHOLOGY

## 2024-04-08 NOTE — Progress Notes (Signed)
 Patient needs PET scan prior to CT-guided biopsy per IR.  Will arrange for PET scan ASAP.  Reviewed labs from 04/06/2024 which shows mild anemia, neutropenia and thrombocytopenia.  Uric acid is elevated at 13.3 but calcium  level is 8.6.  Creatinine stable at 1.27 potassium WNL.  Mild iron deficiency with iron sats of 21%.  Vitamin B12 is mildly low at 308.  Recommend replacing B12 and iron with oral 1000 mcg B12 and 325 mg ferrous sulfate.  Follow-up with Dr. Davonna following workup.   Cody Palmer, AGNP-C Department of Hematology/Oncology [facility]  Phone: [phone]  04/08/2024 8:05 AM

## 2024-04-09 ENCOUNTER — Encounter (HOSPITAL_COMMUNITY): Payer: Self-pay | Admitting: Internal Medicine

## 2024-04-09 ENCOUNTER — Other Ambulatory Visit: Payer: Self-pay | Admitting: Oncology

## 2024-04-09 LAB — SURGICAL PATHOLOGY

## 2024-04-10 ENCOUNTER — Other Ambulatory Visit: Payer: Self-pay

## 2024-04-10 ENCOUNTER — Encounter (HOSPITAL_COMMUNITY): Payer: Self-pay

## 2024-04-10 ENCOUNTER — Inpatient Hospital Stay (HOSPITAL_COMMUNITY)
Admission: EM | Admit: 2024-04-10 | Discharge: 2024-04-24 | DRG: 823 | Disposition: A | Discharge status: Skilled Nursing Facility | Attending: Family Medicine | Admitting: Family Medicine

## 2024-04-10 DIAGNOSIS — E785 Hyperlipidemia, unspecified: Secondary | ICD-10-CM | POA: Diagnosis present

## 2024-04-10 DIAGNOSIS — Z66 Do not resuscitate: Secondary | ICD-10-CM

## 2024-04-10 DIAGNOSIS — E7800 Pure hypercholesterolemia, unspecified: Secondary | ICD-10-CM | POA: Diagnosis present

## 2024-04-10 DIAGNOSIS — R627 Adult failure to thrive: Secondary | ICD-10-CM

## 2024-04-10 DIAGNOSIS — R531 Weakness: Secondary | ICD-10-CM | POA: Diagnosis not present

## 2024-04-10 DIAGNOSIS — R140 Abdominal distension (gaseous): Secondary | ICD-10-CM | POA: Diagnosis not present

## 2024-04-10 DIAGNOSIS — E869 Volume depletion, unspecified: Secondary | ICD-10-CM | POA: Diagnosis present

## 2024-04-10 DIAGNOSIS — I10 Essential (primary) hypertension: Secondary | ICD-10-CM | POA: Diagnosis not present

## 2024-04-10 DIAGNOSIS — Z6830 Body mass index (BMI) 30.0-30.9, adult: Secondary | ICD-10-CM | POA: Diagnosis not present

## 2024-04-10 DIAGNOSIS — Z85828 Personal history of other malignant neoplasm of skin: Secondary | ICD-10-CM | POA: Diagnosis not present

## 2024-04-10 DIAGNOSIS — E883 Tumor lysis syndrome: Secondary | ICD-10-CM | POA: Diagnosis not present

## 2024-04-10 DIAGNOSIS — R188 Other ascites: Secondary | ICD-10-CM | POA: Diagnosis present

## 2024-04-10 DIAGNOSIS — N1831 Chronic kidney disease, stage 3a: Secondary | ICD-10-CM | POA: Diagnosis present

## 2024-04-10 DIAGNOSIS — E119 Type 2 diabetes mellitus without complications: Secondary | ICD-10-CM | POA: Diagnosis not present

## 2024-04-10 DIAGNOSIS — E440 Moderate protein-calorie malnutrition: Secondary | ICD-10-CM | POA: Diagnosis present

## 2024-04-10 DIAGNOSIS — Z96659 Presence of unspecified artificial knee joint: Secondary | ICD-10-CM

## 2024-04-10 DIAGNOSIS — C9110 Chronic lymphocytic leukemia of B-cell type not having achieved remission: Secondary | ICD-10-CM | POA: Diagnosis present

## 2024-04-10 DIAGNOSIS — E874 Mixed disorder of acid-base balance: Secondary | ICD-10-CM | POA: Diagnosis present

## 2024-04-10 DIAGNOSIS — I129 Hypertensive chronic kidney disease with stage 1 through stage 4 chronic kidney disease, or unspecified chronic kidney disease: Secondary | ICD-10-CM | POA: Diagnosis present

## 2024-04-10 DIAGNOSIS — E1122 Type 2 diabetes mellitus with diabetic chronic kidney disease: Secondary | ICD-10-CM | POA: Diagnosis present

## 2024-04-10 DIAGNOSIS — Z7982 Long term (current) use of aspirin: Secondary | ICD-10-CM | POA: Diagnosis not present

## 2024-04-10 DIAGNOSIS — E875 Hyperkalemia: Secondary | ICD-10-CM | POA: Diagnosis present

## 2024-04-10 DIAGNOSIS — D630 Anemia in neoplastic disease: Secondary | ICD-10-CM | POA: Diagnosis present

## 2024-04-10 DIAGNOSIS — N179 Acute kidney failure, unspecified: Principal | ICD-10-CM

## 2024-04-10 DIAGNOSIS — E876 Hypokalemia: Secondary | ICD-10-CM | POA: Diagnosis present

## 2024-04-10 DIAGNOSIS — R262 Difficulty in walking, not elsewhere classified: Secondary | ICD-10-CM | POA: Diagnosis present

## 2024-04-10 DIAGNOSIS — K5900 Constipation, unspecified: Secondary | ICD-10-CM | POA: Diagnosis present

## 2024-04-10 DIAGNOSIS — C8299 Follicular lymphoma, unspecified, extranodal and solid organ sites: Secondary | ICD-10-CM

## 2024-04-10 DIAGNOSIS — K659 Peritonitis, unspecified: Secondary | ICD-10-CM | POA: Diagnosis present

## 2024-04-10 DIAGNOSIS — C8290 Follicular lymphoma, unspecified, unspecified site: Secondary | ICD-10-CM | POA: Insufficient documentation

## 2024-04-10 DIAGNOSIS — I700 Atherosclerosis of aorta: Secondary | ICD-10-CM | POA: Diagnosis present

## 2024-04-10 DIAGNOSIS — Z7984 Long term (current) use of oral hypoglycemic drugs: Secondary | ICD-10-CM | POA: Diagnosis not present

## 2024-04-10 DIAGNOSIS — C8203 Follicular lymphoma grade I, intra-abdominal lymph nodes: Secondary | ICD-10-CM | POA: Diagnosis present

## 2024-04-10 LAB — URINALYSIS, ROUTINE W REFLEX MICROSCOPIC
Bilirubin Urine: NEGATIVE
Glucose, UA: 150 mg/dL — AB
Hgb urine dipstick: NEGATIVE
Ketones, ur: 5 mg/dL — AB
Leukocytes,Ua: NEGATIVE
Nitrite: NEGATIVE
Protein, ur: NEGATIVE mg/dL
Specific Gravity, Urine: 1.019 (ref 1.005–1.030)
pH: 5 (ref 5.0–8.0)

## 2024-04-10 LAB — CBC WITH DIFFERENTIAL/PLATELET
Abs Immature Granulocytes: 0.04 K/uL (ref 0.00–0.07)
Basophils Absolute: 0 K/uL (ref 0.0–0.1)
Basophils Relative: 1 %
Eosinophils Absolute: 0.1 K/uL (ref 0.0–0.5)
Eosinophils Relative: 1 %
HCT: 28.9 % — ABNORMAL LOW (ref 39.0–52.0)
Hemoglobin: 9.3 g/dL — ABNORMAL LOW (ref 13.0–17.0)
Immature Granulocytes: 1 %
Lymphocytes Relative: 28 %
Lymphs Abs: 1.4 K/uL (ref 0.7–4.0)
MCH: 27 pg (ref 26.0–34.0)
MCHC: 32.2 g/dL (ref 30.0–36.0)
MCV: 83.8 fL (ref 80.0–100.0)
Monocytes Absolute: 0.5 K/uL (ref 0.1–1.0)
Monocytes Relative: 10 %
Neutro Abs: 2.9 K/uL (ref 1.7–7.7)
Neutrophils Relative %: 59 %
Platelets: 153 K/uL (ref 150–400)
RBC: 3.45 MIL/uL — ABNORMAL LOW (ref 4.22–5.81)
RDW: 16.8 % — ABNORMAL HIGH (ref 11.5–15.5)
WBC: 5 K/uL (ref 4.0–10.5)
nRBC: 0 % (ref 0.0–0.2)

## 2024-04-10 LAB — COMPREHENSIVE METABOLIC PANEL WITH GFR
ALT: 50 U/L — ABNORMAL HIGH (ref 0–44)
AST: 70 U/L — ABNORMAL HIGH (ref 15–41)
Albumin: 3.6 g/dL (ref 3.5–5.0)
Alkaline Phosphatase: 79 U/L (ref 38–126)
Anion gap: 14 (ref 5–15)
BUN: 60 mg/dL — ABNORMAL HIGH (ref 8–23)
CO2: 20 mmol/L — ABNORMAL LOW (ref 22–32)
Calcium: 8.3 mg/dL — ABNORMAL LOW (ref 8.9–10.3)
Chloride: 100 mmol/L (ref 98–111)
Creatinine, Ser: 2.45 mg/dL — ABNORMAL HIGH (ref 0.61–1.24)
GFR, Estimated: 25 mL/min — ABNORMAL LOW (ref >60)
Glucose, Bld: 102 mg/dL — ABNORMAL HIGH (ref 70–99)
Potassium: 5 mmol/L (ref 3.5–5.1)
Sodium: 134 mmol/L — ABNORMAL LOW (ref 135–145)
Total Bilirubin: 0.6 mg/dL (ref 0.0–1.2)
Total Protein: 5.6 g/dL — ABNORMAL LOW (ref 6.5–8.1)

## 2024-04-10 LAB — GLUCOSE, CAPILLARY
Glucose-Capillary: 84 mg/dL (ref 70–99)
Glucose-Capillary: 117 mg/dL — ABNORMAL HIGH (ref 70–99)

## 2024-04-10 MED ORDER — ONDANSETRON HCL 4 MG PO TABS: 4.0000 mg | ORAL_TABLET | Freq: Four times a day (QID) | ORAL | Status: DC | PRN

## 2024-04-10 MED ORDER — ONDANSETRON HCL 4 MG/2ML IJ SOLN: 4.0000 mg | Freq: Four times a day (QID) | INTRAMUSCULAR | Status: DC | PRN

## 2024-04-10 MED ORDER — SODIUM CHLORIDE 0.9 % IV BOLUS
500.0000 mL | Freq: Once | INTRAVENOUS | Status: AC
  Administered 2024-04-10: 500 mL via INTRAVENOUS

## 2024-04-10 MED ORDER — OXYCODONE HCL 5 MG PO TABS
5.0000 mg | ORAL_TABLET | ORAL | Status: DC | PRN
  Administered 2024-04-12: 5 mg via ORAL
  Filled 2024-04-10: qty 1

## 2024-04-10 MED ORDER — INSULIN ASPART 100 UNIT/ML IJ SOLN
0.0000 [IU] | Freq: Three times a day (TID) | INTRAMUSCULAR | Status: DC
  Administered 2024-04-15: 3 [IU] via SUBCUTANEOUS
  Administered 2024-04-16 – 2024-04-19 (×5): 1 [IU] via SUBCUTANEOUS
  Administered 2024-04-19: 2 [IU] via SUBCUTANEOUS
  Administered 2024-04-20: 1 [IU] via SUBCUTANEOUS
  Administered 2024-04-20: 2 [IU] via SUBCUTANEOUS
  Administered 2024-04-21: 1 [IU] via SUBCUTANEOUS
  Administered 2024-04-21: 2 [IU] via SUBCUTANEOUS
  Administered 2024-04-22: 1 [IU] via SUBCUTANEOUS
  Administered 2024-04-22 – 2024-04-23 (×2): 2 [IU] via SUBCUTANEOUS
  Filled 2024-04-10 (×12): qty 1

## 2024-04-10 MED ORDER — ONDANSETRON HCL 4 MG/2ML IJ SOLN
4.0000 mg | Freq: Once | INTRAMUSCULAR | Status: AC
  Administered 2024-04-10: 4 mg via INTRAVENOUS
  Filled 2024-04-10: qty 2

## 2024-04-10 MED ORDER — ENSURE PLUS HIGH PROTEIN PO LIQD
237.0000 mL | Freq: Two times a day (BID) | ORAL | Status: DC
  Administered 2024-04-10 – 2024-04-11 (×3): 237 mL via ORAL

## 2024-04-10 MED ORDER — ACETAMINOPHEN 325 MG PO TABS: 650.0000 mg | ORAL_TABLET | Freq: Four times a day (QID) | ORAL | Status: DC | PRN

## 2024-04-10 MED ORDER — AMLODIPINE BESYLATE 5 MG PO TABS
10.0000 mg | ORAL_TABLET | Freq: Every day | ORAL | Status: DC
  Administered 2024-04-11 – 2024-04-15 (×5): 10 mg via ORAL
  Filled 2024-04-10 (×6): qty 2

## 2024-04-10 MED ORDER — SIMETHICONE 80 MG PO CHEW
80.0000 mg | CHEWABLE_TABLET | Freq: Four times a day (QID) | ORAL | Status: DC | PRN
  Administered 2024-04-10 – 2024-04-11 (×2): 80 mg via ORAL
  Filled 2024-04-10 (×3): qty 1

## 2024-04-10 MED ORDER — FENTANYL CITRATE (PF) 100 MCG/2ML IJ SOLN: 12.5000 ug | INTRAMUSCULAR | Status: DC | PRN

## 2024-04-10 MED ORDER — BISACODYL 5 MG PO TBEC: 5.0000 mg | DELAYED_RELEASE_TABLET | Freq: Every day | ORAL | Status: DC | PRN

## 2024-04-10 MED ORDER — HEPARIN SODIUM (PORCINE) 5000 UNIT/ML IJ SOLN
5000.0000 [IU] | Freq: Three times a day (TID) | INTRAMUSCULAR | Status: DC
  Administered 2024-04-10 – 2024-04-24 (×41): 5000 [IU] via SUBCUTANEOUS
  Filled 2024-04-10 (×40): qty 1

## 2024-04-10 MED ORDER — ASPIRIN 81 MG PO TBEC
81.0000 mg | DELAYED_RELEASE_TABLET | Freq: Every day | ORAL | Status: DC
  Administered 2024-04-11 – 2024-04-24 (×14): 81 mg via ORAL
  Filled 2024-04-10 (×14): qty 1

## 2024-04-10 MED ORDER — SODIUM CHLORIDE 0.9 % IV SOLN: INTRAVENOUS | Status: DC

## 2024-04-10 MED ORDER — PANTOPRAZOLE SODIUM 40 MG PO TBEC
40.0000 mg | DELAYED_RELEASE_TABLET | Freq: Every evening | ORAL | Status: DC
  Administered 2024-04-11 – 2024-04-23 (×13): 40 mg via ORAL
  Filled 2024-04-10 (×13): qty 1

## 2024-04-10 MED ORDER — ACETAMINOPHEN 650 MG RE SUPP: 650.0000 mg | Freq: Four times a day (QID) | RECTAL | Status: DC | PRN

## 2024-04-10 MED ORDER — PANTOPRAZOLE SODIUM 40 MG IV SOLR
40.0000 mg | Freq: Once | INTRAVENOUS | Status: AC
Start: 2024-04-10 — End: 2024-04-10
  Administered 2024-04-10: 40 mg via INTRAVENOUS
  Filled 2024-04-10: qty 10

## 2024-04-10 NOTE — Progress Notes (Signed)
 Per NT, pt assisted up to bathroom for void and BM. Pt flushed toilet before staff could visualize stool type, size or color.

## 2024-04-10 NOTE — ED Provider Notes (Signed)
  EMERGENCY DEPARTMENT AT [facility] Provider Note   CSN: [id number] Arrival date & time: 04/10/24  9042     Patient presents with: Weakness   Cody Palmer is a 89 y.o. male.    Weakness Associated symptoms: abdominal pain, diarrhea, nausea and vomiting   Associated symptoms: no arthralgias, no chest pain, no cough, no dizziness, no dysuria, no fever, no headaches, no myalgias and no shortness of breath         Cody Palmer is a 89 y.o. male history of type 2 diabetes, chronic kidney disease, hypertension hyperlipidemia who presents to the Emergency Department complaining of abdominal distention, nausea vomiting and diarrhea.  He was seen here on 04/04/2024 and admitted to the hospital for abdominal distention and possible colitis vs lymphoma.  He was seen by GI underwent colonoscopy on 1121.  Colonoscopy with 3 polyps in the ascending colon and cecum and 3 polyps in the transverse colon.  Exam was otherwise normal.  Patient was also seen by oncology during his hospital stay.  Plan for PET scan early December then to proceed with biopsy.  He was discharged home on 4 days ago continues to have abdominal pain, generalized weakness, and decreased appetite.  He has had 2 episodes of vomiting since his discharge home as well.  Continues to have diarrhea.  Denies any fever chills chest pain or shortness of breath.  Denies any black or bloody stools.   Prior to Admission medications   Medication Sig Start Date End Date Taking? Authorizing Provider  amLODipine  (NORVASC ) 10 MG tablet Take 10 mg by mouth daily.    [provider]  aspirin  EC 81 MG tablet Take 81 mg by mouth daily. Swallow whole.    [provider]  Cholecalciferol  (VITAMIN D3) 25 MCG (1000 UT) CAPS Take 1,000 Units by mouth daily.    [provider]  JARDIANCE  25 MG TABS tablet Take 12.5 mg by mouth daily. 12/09/20   [provider]  labetalol  (NORMODYNE ) 300 MG  tablet Take 300 mg by mouth 2 (two) times daily. 11/20/20   [provider]  losartan -hydrochlorothiazide  (HYZAAR) 100-25 MG per tablet Take 1 tablet by mouth daily.    [provider]  metFORMIN  (GLUCOPHAGE ) 500 MG tablet Take 500 mg by mouth 2 (two) times daily with a meal.    [provider]  Multiple Vitamins-Minerals (EYE HEALTH AREDS 2 PO) Take by mouth 2 (two) times daily.    [provider]  ondansetron  (ZOFRAN ) 4 MG tablet Take 1 tablet (4 mg total) by mouth daily as needed for nausea or vomiting. 04/06/24 04/06/25  Maree, Pratik D, DO  pantoprazole  (PROTONIX ) 40 MG tablet Take 1 tablet (40 mg total) by mouth daily. 30 minutes before breakfast 03/27/24   Shirlean Therisa ORN, NP  polyethylene glycol (MIRALAX ) 17 g packet Take 17 g by mouth daily as needed for mild constipation or moderate constipation. 04/06/24   Maree, Pratik D, DO  rosuvastatin  (CRESTOR ) 10 MG tablet Take 10 mg by mouth every other day.    [provider]  simethicone  (MYLICON) 80 MG chewable tablet Chew 1 tablet (80 mg total) by mouth every 6 (six) hours as needed for flatulence. 04/06/24   Shah, Pratik D, DO    Allergies: Patient has no known allergies.    Review of Systems  Constitutional:  Positive for appetite change. Negative for chills and fever.  Respiratory:  Negative for cough and shortness of breath.  Cardiovascular:  Negative for chest pain and leg swelling.  Gastrointestinal:  Positive for abdominal distention, abdominal pain, diarrhea, nausea and vomiting. Negative for blood in stool.  Genitourinary:  Positive for decreased urine volume. Negative for difficulty urinating and dysuria.  Musculoskeletal:  Negative for arthralgias and myalgias.  Neurological:  Positive for weakness. Negative for dizziness, syncope and headaches.    Updated Vital Signs BP (!) 110/46   Pulse 76   Temp 98 F (36.7 C) (Oral)   Resp 18   Ht 5' 9 (1.753 m)   Wt 92.4 kg   SpO2 93%    BMI 30.08 kg/m   Physical Exam Vitals and nursing note reviewed.  Constitutional:      Appearance: Normal appearance.  HENT:     Mouth/Throat:     Mouth: Mucous membranes are moist.  Eyes:     Conjunctiva/sclera: Conjunctivae normal.     Pupils: Pupils are equal, round, and reactive to light.  Cardiovascular:     Rate and Rhythm: Normal rate and regular rhythm.     Pulses: Normal pulses.  Pulmonary:     Effort: Pulmonary effort is normal.  Chest:     Chest wall: No tenderness.  Abdominal:     General: There is distension.     Palpations: Abdomen is soft.     Tenderness: There is no abdominal tenderness. There is no guarding or rebound.  Musculoskeletal:        General: Normal range of motion.     Right lower leg: No edema.     Left lower leg: No edema.  Skin:    General: Skin is warm.     Capillary Refill: Capillary refill takes less than 2 seconds.  Neurological:     General: No focal deficit present.     Mental Status: He is alert.     Sensory: No sensory deficit.     Motor: No weakness.     (all labs ordered are listed, but only abnormal results are displayed) Labs Reviewed  COMPREHENSIVE METABOLIC PANEL WITH GFR - Abnormal; Notable for the following components:      Result Value   Sodium 134 (*)    CO2 20 (*)    Glucose, Bld 102 (*)    BUN 60 (*)    Creatinine, Ser 2.45 (*)    Calcium  8.3 (*)    Total Protein 5.6 (*)    AST 70 (*)    ALT 50 (*)    GFR, Estimated 25 (*)    All other components within normal limits  CBC WITH DIFFERENTIAL/PLATELET - Abnormal; Notable for the following components:   RBC 3.45 (*)    Hemoglobin 9.3 (*)    HCT 28.9 (*)    RDW 16.8 (*)    All other components within normal limits  URINALYSIS, ROUTINE W REFLEX MICROSCOPIC    EKG: EKG Interpretation Date/Time:  Wednesday April 10 2024 10:16:41 EST Ventricular Rate:  77 PR Interval:  191 QRS Duration:  109 QT Interval:  400 QTC Calculation: 456 R Axis:   63  Text  Interpretation: Sinus rhythm Low voltage, precordial leads Minimal ST depression, anterolateral leads Confirmed by Yolande Charleston [phone]) on 04/10/2024 10:52:16 AM  Radiology: No results found.   Procedures   Medications Ordered in the ED  ondansetron  (ZOFRAN ) injection 4 mg (has no administration in time range)  pantoprazole  (PROTONIX ) injection 40 mg (has no administration in time range)  Medical Decision Making   Patient here from home for evaluation of abdominal pain nausea vomiting diarrhea.  Was seen here for issues of discharge 4 days ago for similar symptoms.  Underwent colonoscopy during his hospital stay without acute finding.  Discharged home 4 days ago continues to have abdominal bloating diarrhea, flatus, nausea some intermittent vomiting.  Has tolerated a small amount of solid foods and liquids at home but continues to have decreasing appetite.  Family concerned about possible dehydration.  Scheduled for PET scan and biopsy in early December.  Patient nontoxic-appearing on my exam.  Abdomen seems distended without significant tenderness.  He is having stools and flatus so SBO is doubtful.  Will recheck labs, give IV fluids.  Will likely consult with oncology once results are back.   Amount and/or Complexity of Data Reviewed Labs: ordered.    Details: No leukocytosis, chemistry showed new AKI.  BUN 60 serum creatinine 2.45.  Calcium  low at baseline ECG/medicine tests: ordered.    Details: EKG shows sinus rhythm minimal ST depression Discussion of management or test interpretation with external provider(s):  Patient with continued abdominal bloating nausea vomiting diarrhea.  No worsening abdominal pain, or fever to suggest need for repeat CT imaging of the abdomen pelvis today.  Continues to have flatus.  Was admitted recently and had workup for possible lymphoma versus colitis.  Underwent colonoscopy during hospital stay.  Has upcoming  appointment in early December for PET scan then proceeding with biopsy to rule out lymphoma.    Workup today shows AKI.  IV fluids given here agreeable to admission  Discussed findings with [facility] hospitalist Dr. Vicci who agrees to admit       Risk Prescription drug management. Decision regarding hospitalization.        Final diagnoses:  AKI (acute kidney injury)    ED Discharge Orders     None          Herlinda Milling, PA-C 04/10/24 1217    Yolande Lamar BROCKS, MD 04/11/24 [phone]

## 2024-04-10 NOTE — Hospital Course (Signed)
 89 year old male with stage IIIa CKD, hypertension, hyperlipidemia, type 2 diabetes mellitus, controlled, recently hospitalized for abdominal distention and concern for colitis versus lymphoma.  He was discharged 4 days ago to home.  He returns today due to poor oral intake, almost no appetite, poor fluid consumption and failure to thrive.  He has become increasingly more weak.  He was seen by GI and had colonoscopy on 04/05/2024 with findings of polyps but otherwise pretty normal.  He also had been seen by oncology during last hospital stay and arrangements made for outpatient PET scan on 04/18/2024 and then will proceed with a lymph node biopsy with IR.  Patient has been having nausea and taking Zofran , abdominal distention persist, poor oral intake persists.  He has been having loose diarrheal stools.  Today he is noted to have another AKI and started on IV fluid hydration.  Admission was requested for further management.

## 2024-04-10 NOTE — H&P (Signed)
 History and Physical  [facility]  Cody Palmer FMW:[id number] DOB: Nov 01, 1932 DOA: 04/10/2024  PCP: [name], Kalombo Cesar, NP  Patient coming from: Home  Level of care: Med-Surg  I have personally briefly reviewed patient's old medical records in [facility]  Chief Complaint: weakness  HPI: Cody Palmer is a 89 year old male with stage IIIa CKD, hypertension, hyperlipidemia, type 2 diabetes mellitus, controlled, recently hospitalized for abdominal distention and concern for colitis versus lymphoma.  He was discharged 4 days ago to home.  He returns today due to poor oral intake, almost no appetite, poor fluid consumption and failure to thrive.  He has become increasingly more weak.  He was seen by GI and had colonoscopy on 04/05/2024 with findings of polyps but otherwise pretty normal.  He also had been seen by oncology during last hospital stay and arrangements made for outpatient PET scan on 04/18/2024 and then will proceed with a lymph node biopsy with IR.  Patient has been having nausea and taking Zofran , abdominal distention persist, poor oral intake persists.  He has been having loose diarrheal stools.  Today he is noted to have another AKI and started on IV fluid hydration.  Admission was requested for further management.   Past Medical History:  Diagnosis Date   Arthritis    Cancer (HCC)    Skin cancer   Diabetes mellitus without complication (HCC)    HTN (hypertension)    Hypercholesteremia     Past Surgical History:  Procedure Laterality Date   CATARACT EXTRACTION W/PHACO Left 04/25/2016   Procedure: CATARACT EXTRACTION PHACO AND INTRAOCULAR LENS PLACEMENT LEFT EYE;  Surgeon: Cherene Mania, MD;  Location: [facility] ORS;  Service: Ophthalmology;  Laterality: Left;  CDE: 10.37   COLONOSCOPY N/A 04/05/2024   Procedure: COLONOSCOPY;  Surgeon: Cindie Carlin POUR, DO;  Location: [facility] ENDO SUITE;  Service: Endoscopy;  Laterality: N/A;   CYSTOSCOPY WITH BIOPSY N/A 08/13/2013    Procedure: CYSTOSCOPY WITH BLADDER BIOPSY;  Surgeon: Garnette Shack, MD;  Location: [facility] ORS;  Service: Urology;  Laterality: N/A;   ENUCLEATION Right    Removed in 1958   EYE SURGERY  1958   removal of eye   TOTAL KNEE ARTHROPLASTY Right 01/11/2013   Procedure: RIGHT TOTAL KNEE ARTHROPLASTY;  Surgeon: Taft FORBES Minerva, MD;  Location: [facility];  Service: Orthopedics;  Laterality: Right;   TOTAL KNEE ARTHROPLASTY Left 07/12/2022   Procedure: TOTAL KNEE ARTHROPLASTY;  Surgeon: Minerva Taft FORBES, MD;  Location: [facility];  Service: Orthopedics;  Laterality: Left;     reports that he quit smoking about 59 years ago. His smoking use included cigarettes. He started smoking about 67 years ago. He has a 8 pack-year smoking history. He has never used smokeless tobacco. He reports that he does not drink alcohol and does not use drugs.  No Known Allergies  Family History  Problem Relation Age of Onset   Arthritis Unknown    Cancer Unknown    Diabetes Unknown     Prior to Admission medications   Medication Sig Start Date End Date Taking? Authorizing Provider  amLODipine  (NORVASC ) 10 MG tablet Take 10 mg by mouth daily.    [provider]  aspirin  EC 81 MG tablet Take 81 mg by mouth daily. Swallow whole.    [provider]  Cholecalciferol  (VITAMIN D3) 25 MCG (1000 UT) CAPS Take 1,000 Units by mouth daily.    [provider]  JARDIANCE  25 MG TABS tablet Take 12.5 mg by  mouth daily. 12/09/20   [provider]  labetalol  (NORMODYNE ) 300 MG tablet Take 300 mg by mouth 2 (two) times daily. 11/20/20   [provider]  losartan -hydrochlorothiazide  (HYZAAR) 100-25 MG per tablet Take 1 tablet by mouth daily.    [provider]  metFORMIN  (GLUCOPHAGE ) 500 MG tablet Take 500 mg by mouth 2 (two) times daily with a meal.    [provider]  Multiple Vitamins-Minerals (EYE HEALTH AREDS 2 PO) Take by mouth 2 (two) times daily.    [provider]  ondansetron  (ZOFRAN ) 4 MG tablet Take 1 tablet (4 mg total) by mouth daily as needed for nausea or vomiting. 04/06/24 04/06/25  Maree, Pratik D, DO  pantoprazole  (PROTONIX ) 40 MG tablet Take 1 tablet (40 mg total) by mouth daily. 30 minutes before breakfast 03/27/24   Shirlean Therisa ORN, NP  polyethylene glycol (MIRALAX ) 17 g packet Take 17 g by mouth daily as needed for mild constipation or moderate constipation. 04/06/24   Maree, Pratik D, DO  rosuvastatin  (CRESTOR ) 10 MG tablet Take 10 mg by mouth every other day.    [provider]  simethicone  (MYLICON) 80 MG chewable tablet Chew 1 tablet (80 mg total) by mouth every 6 (six) hours as needed for flatulence. 04/06/24   Maree Bracken D, DO    Physical Exam: Vitals:   04/10/24 1100 04/10/24 1115 04/10/24 1200 04/10/24 1215  BP: (!) 110/48 (!) 113/46  (!) 116/46  Pulse: 77 77 78 76  Resp: (!) 21 16 (!) 26 (!) 24  Temp:      TempSrc:      SpO2: 92% 92% 92% 92%  Weight:      Height:       Constitutional: NAD, calm, comfortable Eyes: PERRL, lids and conjunctivae normal ENMT: Mucous membranes are dry. Posterior pharynx clear of any exudate or lesions.Normal dentition.  Neck: normal, supple, no masses, no thyromegaly Respiratory: clear to auscultation bilaterally, no wheezing, no crackles. Normal respiratory effort. No accessory muscle use.  Cardiovascular: normal s1, s2 sounds, no murmurs / rubs / gallops. No extremity edema. 2+ pedal pulses. No carotid bruits.  Abdomen: no tenderness, no masses palpated. No hepatosplenomegaly. Bowel sounds positive.  Musculoskeletal: no clubbing / cyanosis. No joint deformity upper and lower extremities. Good ROM, no contractures. Normal muscle tone.  Skin: no rashes, lesions, ulcers. No induration Neurologic: CN 2-12 grossly intact. Sensation intact, DTR normal. Strength 5/5 in all 4.  Psychiatric: Normal judgment and insight. Alert and oriented x 3. Normal mood.   Labs on Admission: I have  personally reviewed following labs and imaging studies  CBC: Recent Labs  Lab 04/04/24 0930 04/05/24 0455 04/06/24 0435 04/10/24 1046  WBC 3.8* 3.5* 3.2* 5.0  NEUTROABS 1.4*  --   --  2.9  HGB 10.9* 10.5* 10.3* 9.3*  HCT 33.3* 31.7* 31.8* 28.9*  MCV 82.0 81.3 83.0 83.8  PLT 150 144* 141* 153   Basic Metabolic Panel: Recent Labs  Lab 04/04/24 0930 04/05/24 0455 04/06/24 0435 04/10/24 1046  NA 137 137 141 134*  K 4.5 4.4 4.2 5.0  CL 100 101 105 100  CO2 22 23 24  20*  GLUCOSE 116* 97 77 102*  BUN 53* 37* 27* 60*  CREATININE 1.45* 1.27* 1.11 2.45*  CALCIUM  8.8* 8.6* 8.3* 8.3*  MG  --  2.7* 2.5*  --    GFR: Estimated Creatinine Clearance: 23 mL/min (A) (by C-G formula based on SCr of 2.45 mg/dL (H)). Liver Function Tests:  Recent Labs  Lab 04/04/24 0930 04/05/24 0455 04/06/24 0435 04/10/24 1046  AST 68* 57* 47* 70*  ALT 60* 55* 43 50*  ALKPHOS 81 80 74 79  BILITOT 0.7 0.7 0.5 0.6  PROT 6.1* 5.8* 5.2* 5.6*  ALBUMIN 3.8 3.8 3.5 3.6   Recent Labs  Lab 04/04/24 0930  LIPASE 24   No results for input(s): AMMONIA in the last 168 hours. Coagulation Profile: No results for input(s): INR, PROTIME in the last 168 hours. Cardiac Enzymes: No results for input(s): CKTOTAL, CKMB, CKMBINDEX, TROPONINI in the last 168 hours. BNP (last 3 results) No results for input(s): PROBNP in the last 8760 hours. HbA1C: No results for input(s): HGBA1C in the last 72 hours. CBG: Recent Labs  Lab 04/05/24 1605 04/05/24 2013 04/06/24 0113 04/06/24 0525 04/06/24 0719  GLUCAP 91 140* 92 92 90   Lipid Profile: No results for input(s): CHOL, HDL, LDLCALC, TRIG, CHOLHDL, LDLDIRECT in the last 72 hours. Thyroid Function Tests: No results for input(s): TSH, T4TOTAL, FREET4, T3FREE, THYROIDAB in the last 72 hours. Anemia Panel: No results for input(s): VITAMINB12, FOLATE, FERRITIN, TIBC, IRON, RETICCTPCT in the last 72 hours. Urine  analysis:    Component Value Date/Time   COLORURINE YELLOW 04/04/2024 1043   APPEARANCEUR CLEAR 04/04/2024 1043   LABSPEC 1.014 04/04/2024 1043   PHURINE 5.0 04/04/2024 1043   GLUCOSEU >=500 (A) 04/04/2024 1043   HGBUR NEGATIVE 04/04/2024 1043   BILIRUBINUR NEGATIVE 04/04/2024 1043   KETONESUR NEGATIVE 04/04/2024 1043   PROTEINUR NEGATIVE 04/04/2024 1043   UROBILINOGEN 2.0 (H) 07/04/2013 1917   NITRITE NEGATIVE 04/04/2024 1043   LEUKOCYTESUR NEGATIVE 04/04/2024 1043   Radiological Exams on Admission: No results found.  EKG: Independently reviewed.   Assessment/Plan Principal Problem:   AKI (acute kidney injury) Active Problems:   Difficulty walking   S/P total knee replacement   Chronic kidney disease, stage 3a (HCC)   Essential hypertension   Hyperlipidemia   Type 2 diabetes mellitus without complications (HCC)   Abdominal distention   Generalized weakness   Failure to thrive in adult   AKI on CKD stage 3a -- due to poor oral intake and diarrhea -- agree with IV fluid hydration started in ED -- follow BMP -- avoiding nephrotoxins  Essential hypertension  -- stable, resume home meds as able -- BPs currently soft   Controlled type 2 DM  -- sensitive SSI coverage ordered  Generalized Weakness -- secondary to metabolic derangements  -- addressing as noted -- encourage ambulation  Adult failure to thrive -- he remains weak, failed recent discharge home -- not sure he can live alone -- hopefully he improves with supportive measures  Abdominal distension Question of abdominal lymphoma -- ongoing outpatient work up  -- he is to have a PET scan outpatient on 12/4 -- he is to see Dr. Davonna with oncology on 12/18 -- he is to have IR lymph node biopsy after PET scan   DVT prophylaxis: heparin    Code Status: DNR   Family Communication: bedside update   Disposition Plan: TBD   Consults called:   Admission status: INP Time spent: 57 mins   Level of care:  Med-Surg Afton Louder MD [facility] How to contact the [facility] Attending or Consulting provider 7A - 7P or covering provider during after hours 7P -7A, for this patient?  Check the care team in [facility] and look for a) attending/consulting [facility] provider listed and b) the [facility] team listed Log into [email or web address] and use [facility]'s universal password to access. If you do not have the password, please contact the hospital operator. Locate the [facility] provider you are looking for under [facility] and page to a number that you can be directly reached. If you still have difficulty reaching the provider, please page the [facility] (Director on Call) for the Hospitalists listed on amion for assistance.   If 7PM-7AM, please contact night-coverage [email or web address] Password [facility]  04/10/2024, 12:31 PM

## 2024-04-10 NOTE — ED Notes (Signed)
 Pt stated unable to provide urine sample at this time. Pt does have fluid running atm, informed pt we could wait 30 mins and then attempt to void after fluids

## 2024-04-10 NOTE — Care Management Important Message (Signed)
 Important Message  Patient Details  Name: Cody Palmer MRN: [id number] Date of Birth: 1932-08-25   Important Message Given:  Yes - Medicare IM     Jayliana Valencia L Breckin Zafar 04/10/2024, 1:56 PM

## 2024-04-10 NOTE — ED Notes (Signed)
 Pt ambulated to restroom with assistance; unable to void at this time. Will start second bag of fluids to assist in this aspect.

## 2024-04-10 NOTE — ED Triage Notes (Addendum)
 Pt from home complains of weakness, loss of appetite, nausea and vomiting. Pt endorses 8/10 abdominal pain. Pt recently discharged on Saturday, Pt aaox4, denies CP and SOB. Last BM this morning

## 2024-04-11 ENCOUNTER — Inpatient Hospital Stay (HOSPITAL_COMMUNITY)

## 2024-04-11 DIAGNOSIS — R531 Weakness: Secondary | ICD-10-CM | POA: Diagnosis not present

## 2024-04-11 DIAGNOSIS — N179 Acute kidney failure, unspecified: Secondary | ICD-10-CM | POA: Diagnosis not present

## 2024-04-11 DIAGNOSIS — N1831 Chronic kidney disease, stage 3a: Secondary | ICD-10-CM

## 2024-04-11 DIAGNOSIS — R627 Adult failure to thrive: Secondary | ICD-10-CM | POA: Diagnosis not present

## 2024-04-11 DIAGNOSIS — R140 Abdominal distension (gaseous): Secondary | ICD-10-CM | POA: Diagnosis not present

## 2024-04-11 LAB — GLUCOSE, CAPILLARY
Glucose-Capillary: 78 mg/dL (ref 70–99)
Glucose-Capillary: 115 mg/dL — ABNORMAL HIGH (ref 70–99)
Glucose-Capillary: 105 mg/dL — ABNORMAL HIGH (ref 70–99)
Glucose-Capillary: 109 mg/dL — ABNORMAL HIGH (ref 70–99)

## 2024-04-11 LAB — CBC
HCT: 28 % — ABNORMAL LOW (ref 39.0–52.0)
Hemoglobin: 8.9 g/dL — ABNORMAL LOW (ref 13.0–17.0)
MCH: 26.8 pg (ref 26.0–34.0)
MCHC: 31.8 g/dL (ref 30.0–36.0)
MCV: 84.3 fL (ref 80.0–100.0)
Platelets: 138 K/uL — ABNORMAL LOW (ref 150–400)
RBC: 3.32 MIL/uL — ABNORMAL LOW (ref 4.22–5.81)
RDW: 16.9 % — ABNORMAL HIGH (ref 11.5–15.5)
WBC: 4.4 K/uL (ref 4.0–10.5)
nRBC: 0 % (ref 0.0–0.2)

## 2024-04-11 LAB — BASIC METABOLIC PANEL WITH GFR
Anion gap: 14 (ref 5–15)
BUN: 56 mg/dL — ABNORMAL HIGH (ref 8–23)
CO2: 20 mmol/L — ABNORMAL LOW (ref 22–32)
Calcium: 7.9 mg/dL — ABNORMAL LOW (ref 8.9–10.3)
Chloride: 104 mmol/L (ref 98–111)
Creatinine, Ser: 1.97 mg/dL — ABNORMAL HIGH (ref 0.61–1.24)
GFR, Estimated: 32 mL/min — ABNORMAL LOW (ref >60)
Glucose, Bld: 79 mg/dL (ref 70–99)
Potassium: 4.3 mmol/L (ref 3.5–5.1)
Sodium: 138 mmol/L (ref 135–145)

## 2024-04-11 MED ORDER — SODIUM CHLORIDE 0.9 % IV SOLN: INTRAVENOUS | Status: DC

## 2024-04-11 NOTE — Progress Notes (Signed)
 PROGRESS NOTE   Cody Palmer  FMW:[id number] DOB: [birth year] DOA: 04/10/2024 PCP: [name], NP   Chief Complaint  Patient presents with   Weakness   Level of care: Med-Surg  Brief Admission History:  89 year old male with stage IIIa CKD, hypertension, hyperlipidemia, type 2 diabetes mellitus, controlled, recently hospitalized for abdominal distention and concern for colitis versus lymphoma.  He was discharged 4 days ago to home.  He returns today due to poor oral intake, almost no appetite, poor fluid consumption and failure to thrive.  He has become increasingly more weak.  He was seen by GI and had colonoscopy on 04/05/2024 with findings of polyps but otherwise pretty normal.  He also had been seen by oncology during last hospital stay and arrangements made for outpatient PET scan on 04/18/2024 and then will proceed with a lymph node biopsy with IR.  Patient has been having nausea and taking Zofran , abdominal distention persist, poor oral intake persists.  He has been having loose diarrheal stools.  Today he is noted to have another AKI and started on IV fluid hydration.  Admission was requested for further management.   Assessment and Plan:  AKI on CKD stage 3a -- due to poor oral intake and diarrhea -- agree with IV fluid hydration, continue today -- follow BMP as creatinine trends down -- avoiding nephrotoxins   Essential hypertension  -- stable, resume home meds as able -- BPs currently soft    Controlled type 2 DM  -- sensitive SSI coverage ordered  CBG (last 3)  Recent Labs    04/10/24 2049 04/11/24 0733 04/11/24 1131  GLUCAP 117* 78 115*   Generalized Weakness -- secondary to metabolic derangements  -- addressing as noted -- encourage ambulation   Adult failure to thrive -- he remains weak, failed recent discharge home -- not sure he can live alone -- hopefully he improves with supportive measures   Abdominal distension Question of  abdominal lymphoma -- ongoing outpatient work up  -- he is to have a PET scan outpatient on 12/4 -- he is to see Dr. Davonna with oncology on 12/18 -- he is to have IR lymph node biopsy after PET scan    DVT prophylaxis: heparin  Code Status: DNR  Family Communication: daughter bedside updated Disposition: anticipate home in 1-2 days if he continues to improve   Consultants:   Procedures:   Antimicrobials:    Subjective: Pt reporting bloating in the abdomen, had a BM this morning.  Only eating very small amount and does not have an appetite.    Objective: Vitals:   04/10/24 1357 04/10/24 1755 04/10/24 2047 04/11/24 0341  BP: (!) 114/57 (!) 141/57 (!) 122/51 (!) 116/56  Pulse: 80 93 86 90  Resp: 18 20 20 20   Temp: 98.4 F (36.9 C) 98 F (36.7 C) 98.2 F (36.8 C) 98.1 F (36.7 C)  TempSrc: Oral Oral Oral Oral  SpO2: 93% 93% 92% 93%  Weight: 93.4 kg     Height: 5' 9 (1.753 m)       Intake/Output Summary (Last 24 hours) at 04/11/2024 1245 Last data filed at 04/11/2024 0943 Gross per 24 hour  Intake 502.94 ml  Output 550 ml  Net -47.06 ml   Filed Weights   04/10/24 1006 04/10/24 1357  Weight: 92.4 kg 93.4 kg   Examination:  General exam: Appears calm and comfortable  Respiratory system: Clear to auscultation. Respiratory effort normal. Cardiovascular system: normal S1 & S2 heard. No JVD, murmurs,  rubs, gallops or clicks. No pedal edema. Gastrointestinal system: Abdomen is distended, soft and nontender. No organomegaly or masses felt. Normal bowel sounds heard. Central nervous system: Alert and oriented. No focal neurological deficits. Extremities: Symmetric 5 x 5 power. Skin: No rashes, lesions or ulcers. Psychiatry: Judgement and insight appear normal. Mood & affect appropriate.   Data Reviewed: I have personally reviewed following labs and imaging studies  CBC: Recent Labs  Lab 04/05/24 0455 04/06/24 0435 04/10/24 1046 04/11/24 0435  WBC 3.5* 3.2* 5.0  4.4  NEUTROABS  --   --  2.9  --   HGB 10.5* 10.3* 9.3* 8.9*  HCT 31.7* 31.8* 28.9* 28.0*  MCV 81.3 83.0 83.8 84.3  PLT 144* 141* 153 138*    Basic Metabolic Panel: Recent Labs  Lab 04/05/24 0455 04/06/24 0435 04/10/24 1046 04/11/24 0435  NA 137 141 134* 138  K 4.4 4.2 5.0 4.3  CL 101 105 100 104  CO2 23 24 20* 20*  GLUCOSE 97 77 102* 79  BUN 37* 27* 60* 56*  CREATININE 1.27* 1.11 2.45* 1.97*  CALCIUM  8.6* 8.3* 8.3* 7.9*  MG 2.7* 2.5*  --   --     CBG: Recent Labs  Lab 04/06/24 0719 04/10/24 1618 04/10/24 2049 04/11/24 0733 04/11/24 1131  GLUCAP 90 84 117* 78 115*    No results found for this or any previous visit (from the past 240 hours).   Radiology Studies: US  ASCITES (ABDOMEN LIMITED) Result Date: 04/11/2024 CLINICAL DATA:  Abdominal distension.  Assess for ascites. EXAM: LIMITED ABDOMEN ULTRASOUND FOR ASCITES TECHNIQUE: Limited ultrasound survey for ascites was performed in all four abdominal quadrants. COMPARISON:  CT, 04/04/2024. FINDINGS: Small amount of ascites, most evident in the lower quadrants. This overall appears increased compared to the prior CT. IMPRESSION: 1. Small amount of ascites. Electronically Signed   By: Alm Parkins M.D.   On: 04/11/2024 12:38    Scheduled Meds:  amLODipine   10 mg Oral Daily   aspirin  EC  81 mg Oral Daily   feeding supplement  237 mL Oral BID BM   heparin   5,000 Units Subcutaneous Q8H   insulin  aspart  0-9 Units Subcutaneous TID WC   pantoprazole   40 mg Oral QPM   Continuous Infusions:  sodium chloride  50 mL/hr at 04/11/24 0852    LOS: 1 day   Time spent: 55 mins  [name], MD How to contact the [facility] Attending or Consulting provider 7A - 7P or covering provider during after hours 7P -7A, for this patient?  Check the care team in [facility] and look for a) attending/consulting [facility] provider listed and b) the [facility] team listed Log into [email or web address] to find provider on call.  Locate the [facility] provider you are  looking for under [facility] and page to a number that you can be directly reached. If you still have difficulty reaching the provider, please page the [facility] (Director on Call) for the Hospitalists listed on amion for assistance.  04/11/2024, 12:45 PM

## 2024-04-11 NOTE — Progress Notes (Signed)
   04/11/24 1330  TOC Brief Assessment  Insurance and Status Reviewed  Patient has primary care physician Yes  Home environment has been reviewed From Home  Prior level of function: Independent  Prior/Current Home Services No current home services  Social Drivers of Health Review SDOH reviewed no interventions necessary  Readmission risk has been reviewed Yes  Transition of care needs no transition of care needs at this time   Inpatient Care Manager (ICM) has reviewed patient and no ICM needs have been identified at this time. We will continue to monitor patient advancement through interdisciplinary progression rounds. If new patient transition needs arise, please place a ICM consult.

## 2024-04-12 ENCOUNTER — Inpatient Hospital Stay (HOSPITAL_COMMUNITY)

## 2024-04-12 DIAGNOSIS — N179 Acute kidney failure, unspecified: Secondary | ICD-10-CM | POA: Diagnosis not present

## 2024-04-12 DIAGNOSIS — R531 Weakness: Secondary | ICD-10-CM | POA: Diagnosis not present

## 2024-04-12 DIAGNOSIS — R140 Abdominal distension (gaseous): Secondary | ICD-10-CM | POA: Diagnosis not present

## 2024-04-12 DIAGNOSIS — R627 Adult failure to thrive: Secondary | ICD-10-CM | POA: Diagnosis not present

## 2024-04-12 LAB — GLUCOSE, CAPILLARY
Glucose-Capillary: 93 mg/dL (ref 70–99)
Glucose-Capillary: 129 mg/dL — ABNORMAL HIGH (ref 70–99)
Glucose-Capillary: 96 mg/dL (ref 70–99)
Glucose-Capillary: 99 mg/dL (ref 70–99)

## 2024-04-12 LAB — BASIC METABOLIC PANEL WITH GFR
Anion gap: 12 (ref 5–15)
BUN: 43 mg/dL — ABNORMAL HIGH (ref 8–23)
CO2: 21 mmol/L — ABNORMAL LOW (ref 22–32)
Calcium: 8.2 mg/dL — ABNORMAL LOW (ref 8.9–10.3)
Chloride: 106 mmol/L (ref 98–111)
Creatinine, Ser: 1.45 mg/dL — ABNORMAL HIGH (ref 0.61–1.24)
GFR, Estimated: 46 mL/min — ABNORMAL LOW (ref >60)
Glucose, Bld: 94 mg/dL (ref 70–99)
Potassium: 4.3 mmol/L (ref 3.5–5.1)
Sodium: 139 mmol/L (ref 135–145)

## 2024-04-12 LAB — METHYLMALONIC ACID, SERUM: Methylmalonic Acid, Quantitative: 408 nmol/L — ABNORMAL HIGH (ref 0–378)

## 2024-04-12 MED ORDER — SIMETHICONE 80 MG PO CHEW
160.0000 mg | CHEWABLE_TABLET | Freq: Four times a day (QID) | ORAL | Status: DC | PRN
  Administered 2024-04-12: 160 mg via ORAL
  Filled 2024-04-12: qty 2

## 2024-04-12 MED ORDER — FLUTICASONE PROPIONATE 50 MCG/ACT NA SUSP
2.0000 | Freq: Every day | NASAL | Status: DC
  Administered 2024-04-12 – 2024-04-24 (×13): 2 via NASAL
  Filled 2024-04-12: qty 16

## 2024-04-12 MED ORDER — SODIUM CHLORIDE 0.9 % IV SOLN: INTRAVENOUS | Status: AC

## 2024-04-12 MED ORDER — POLYETHYLENE GLYCOL 3350 17 G PO PACK
17.0000 g | PACK | Freq: Every day | ORAL | Status: DC
  Administered 2024-04-12: 17 g via ORAL
  Filled 2024-04-12: qty 1

## 2024-04-12 MED ORDER — LORATADINE 10 MG PO TABS
10.0000 mg | ORAL_TABLET | Freq: Every day | ORAL | Status: DC
  Administered 2024-04-12 – 2024-04-24 (×13): 10 mg via ORAL
  Filled 2024-04-12 (×13): qty 1

## 2024-04-12 MED ORDER — MELATONIN 3 MG PO TABS
3.0000 mg | ORAL_TABLET | Freq: Every evening | ORAL | Status: DC | PRN
  Administered 2024-04-12 – 2024-04-14 (×2): 3 mg via ORAL
  Filled 2024-04-12 (×2): qty 1

## 2024-04-12 MED ORDER — POLYETHYLENE GLYCOL 3350 17 G PO PACK: 17.0000 g | PACK | Freq: Every day | ORAL | Status: DC | PRN

## 2024-04-12 NOTE — Progress Notes (Addendum)
 04/12/2024 10:24 AM [facility]  EXPEDITER PROGRESS NOTE  Patient Name: Cody Palmer  DOB:Dec 11, 1932 Date of Admission: 04/10/2024  Date of Assessment:04/12/24   -------------------------------------------------------------------------------------------------------------------   Brief clinical summary: 04/10/2024 recently discharged home, readmit due to poor po intake, weakness. AKI CKD stage 3A.   Labs, test, and orders reviewed: yes  30-day Readmission: Yes  Discharge order: No  Current discharge plan: home with family support  If anticipated for next day discharge, barriers to discharge before 11am identified:  no immediate barriers noted at this time. EDD currently listed for tomorrow 04/13/2024 pending patient clinical progression.   Intervention provided by [facility] team:  not indicated, no barriers noted.   Barrier resolved: not applicable    -------------------------------------------------------------------------------------------------------------------  [employer] RN Expediter, Havah Ammon, Corean Lobstein Please contact us  directly via secure chat (search for [facility]) or by calling us  at [phone] [facility]).

## 2024-04-12 NOTE — Plan of Care (Signed)
  Problem: Acute Rehab PT Goals(only PT should resolve) Goal: Pt Will Go Supine/Side To Sit Outcome: Progressing Flowsheets (Taken 04/12/2024 1713) Pt will go Supine/Side to Sit: Independently Goal: Patient Will Transfer Sit To/From Stand Outcome: Progressing Flowsheets (Taken 04/12/2024 1713) Patient will transfer sit to/from stand: with modified independence Goal: Pt Will Transfer Bed To Chair/Chair To Bed Outcome: Progressing Flowsheets (Taken 04/12/2024 1713) Pt will Transfer Bed to Chair/Chair to Bed: with modified independence Goal: Pt Will Ambulate Outcome: Progressing Flowsheets (Taken 04/12/2024 1713) Pt will Ambulate:  100 feet  with modified independence  with rolling walker   5:14 PM, 04/12/24 Lynwood Music, MPT Physical Therapist with [facility] [phone] office [phone] mobile phone

## 2024-04-12 NOTE — Evaluation (Signed)
 Occupational Therapy Evaluation Patient Details Name: Cody Palmer MRN: [id number] DOB: 1932-09-08 Today's Date: 04/12/2024   History of Present Illness   89-year-old male with stage IIIa CKD, hypertension, hyperlipidemia, type 2 diabetes mellitus, controlled, recently hospitalized for abdominal distention and concern for colitis versus lymphoma.  He was discharged 4 days ago to home.  He returns today due to poor oral intake, almost no appetite, poor fluid consumption and failure to thrive.  He has become increasingly more weak.  He was seen by GI and had colonoscopy on 04/05/2024 with findings of polyps but otherwise pretty normal.  He also had been seen by oncology during last hospital stay and arrangements made for outpatient PET scan on 04/18/2024 and then will proceed with a lymph node biopsy with IR.  Patient has been having nausea and taking Zofran , abdominal distention persist, poor oral intake persists.  He has been having loose diarrheal stools.  Today he is noted to have another AKI and started on IV fluid hydration.     Clinical Impressions Pt admitted for concerns listed above. PTA pt was independent with all ADL's, functional mobility, even playing golf in the last month. At this time, pt is requiring supervision to contact guard for functional mobility, however remains near baseline for ADL's. He is struggling, mainly due to discomfort and fatigue. Pt has no further OT needs and acute OT Will defer care to PT and mobility specialist for ambulation.      If plan is discharge home, recommend the following:   A little help with walking and/or transfers;Help with stairs or ramp for entrance     Functional Status Assessment   Patient has had a recent decline in their functional status and demonstrates the ability to make significant improvements in function in a reasonable and predictable amount of time.     Equipment Recommendations   None recommended by OT      Recommendations for Other Services         Precautions/Restrictions   Precautions Precautions: Fall Recall of Precautions/Restrictions: Intact Restrictions Weight Bearing Restrictions Per Provider Order: No     Mobility Bed Mobility Overal bed mobility: Modified Independent                  Transfers Overall transfer level: Modified independent Equipment used: Rolling walker (2 wheels)                      Balance Overall balance assessment: Mild deficits observed, not formally tested                                         ADL either performed or assessed with clinical judgement   ADL Overall ADL's : Modified independent;At baseline                                             Vision Baseline Vision/History: 0 No visual deficits Ability to See in Adequate Light: 0 Adequate Patient Visual Report: No change from baseline Vision Assessment?: No apparent visual deficits     Perception         Praxis         Pertinent Vitals/Pain Pain Assessment Pain Assessment: No/denies pain     Extremity/Trunk Assessment Upper Extremity Assessment  Upper Extremity Assessment: Overall WFL for tasks assessed   Lower Extremity Assessment Lower Extremity Assessment: Defer to PT evaluation   Cervical / Trunk Assessment Cervical / Trunk Assessment: Normal   Communication Communication Communication: No apparent difficulties   Cognition Arousal: Alert Behavior During Therapy: WFL for tasks assessed/performed Cognition: No apparent impairments                               Following commands: Intact       Cueing  General Comments   Cueing Techniques: Verbal cues  VSS on RA   Exercises     Shoulder Instructions      Home Living Family/patient expects to be discharged to:: Private residence Living Arrangements: Alone Available Help at Discharge: Family;Available PRN/intermittently Type of  Home: House       Home Layout: One level     Bathroom Shower/Tub: Chief Strategy Officer: Standard Bathroom Accessibility: Yes How Accessible: Accessible via walker            Prior Functioning/Environment Prior Level of Function : Independent/Modified Independent;Driving             Mobility Comments: Not using AD ADLs Comments: Independent, playing golf, driving    OT Problem List: Decreased strength;Decreased activity tolerance;Impaired balance (sitting and/or standing);Pain   OT Treatment/Interventions:        OT Goals(Current goals can be found in the care plan section)   Acute Rehab OT Goals Patient Stated Goal: To feel better OT Goal Formulation: With patient Time For Goal Achievement: 04/26/24 Potential to Achieve Goals: Good   OT Frequency:       Co-evaluation              AM-PAC OT 6 Clicks Daily Activity     Outcome Measure Help from another person eating meals?: None Help from another person taking care of personal grooming?: None Help from another person toileting, which includes using toliet, bedpan, or urinal?: A Little Help from another person bathing (including washing, rinsing, drying)?: A Little Help from another person to put on and taking off regular upper body clothing?: None Help from another person to put on and taking off regular lower body clothing?: A Little 6 Click Score: 21   End of Session Equipment Utilized During Treatment: Gait belt;Rolling walker (2 wheels) Nurse Communication: Mobility status  Activity Tolerance: Patient tolerated treatment well Patient left: in chair;with call bell/phone within reach;with family/visitor present  OT Visit Diagnosis: Unsteadiness on feet (R26.81);Other abnormalities of gait and mobility (R26.89);Muscle weakness (generalized) (M62.81)                Time: 8945-8890 OT Time Calculation (min): 15 min Charges:  OT General Charges $OT Visit: 1 Visit OT Evaluation $OT  Eval Low Complexity: 1 Low  Jacquelene Kopecky Thelbert, OTR/L [place] [facility]  Raylen Ken Jillyn Thelbert 04/12/2024, 12:39 PM

## 2024-04-12 NOTE — Evaluation (Signed)
 Physical Therapy Evaluation Patient Details Name: Cody Palmer MRN: [id number] DOB: 1932-08-19 Today's Date: 04/12/2024  History of Present Illness  ALDRED MASE is a 89 year old male with stage IIIa CKD, hypertension, hyperlipidemia, type 2 diabetes mellitus, controlled, recently hospitalized for abdominal distention and concern for colitis versus lymphoma.  He was discharged 4 days ago to home.  He returns today due to poor oral intake, almost no appetite, poor fluid consumption and failure to thrive.  He has become increasingly more weak.  He was seen by GI and had colonoscopy on 04/05/2024 with findings of polyps but otherwise pretty normal.  He also had been seen by oncology during last hospital stay and arrangements made for outpatient PET scan on 04/18/2024 and then will proceed with a lymph node biopsy with IR.  Patient has been having nausea and taking Zofran , abdominal distention persist, poor oral intake persists.  He has been having loose diarrheal stools.  Today he is noted to have another AKI and started on IV fluid hydration.  Admission was requested for further management.   Clinical Impression  Patient demonstrates good return for sitting up at bedside, had to use RW for gait training due to c/o generalized weakness and stomach discomfort. Patient demonstrates fair/good return for ambulating in hallway without loss of balance, but limited due to fatigue and tolerated sitting up in chair after therapy with his daughter present. Patient will benefit from continued skilled physical therapy in hospital and recommended venue below to increase strength, balance, endurance for safe ADLs and gait.         If plan is discharge home, recommend the following: A little help with walking and/or transfers;A lot of help with walking and/or transfers;A little help with bathing/dressing/bathroom;Help with stairs or ramp for entrance;Assistance with cooking/housework   Can travel by private  vehicle        Equipment Recommendations None recommended by PT  Recommendations for Other Services       Functional Status Assessment Patient has had a recent decline in their functional status and demonstrates the ability to make significant improvements in function in a reasonable and predictable amount of time.     Precautions / Restrictions Precautions Precautions: Fall Recall of Precautions/Restrictions: Intact Restrictions Weight Bearing Restrictions Per Provider Order: No      Mobility  Bed Mobility Overal bed mobility: Modified Independent                  Transfers Overall transfer level: Modified independent Equipment used: Rolling walker (2 wheels)               General transfer comment: had to use RW due generalized weakness    Ambulation/Gait Ambulation/Gait assistance: Contact guard assist Gait Distance (Feet): 75 Feet Assistive device: Rolling walker (2 wheels) Gait Pattern/deviations: Decreased step length - right, Decreased step length - left, Decreased stride length Gait velocity: decreased     General Gait Details: slightly labored movement without loss of balance, limited mostly due to fatigue  Stairs            Wheelchair Mobility     Tilt Bed    Modified Rankin (Stroke Patients Only)       Balance Overall balance assessment: Needs assistance Sitting-balance support: Feet supported, No upper extremity supported Sitting balance-Leahy Scale: Good Sitting balance - Comments: seated at EOB   Standing balance support: During functional activity, Bilateral upper extremity supported Standing balance-Leahy Scale: Fair Standing balance comment: fair/good using RW  Pertinent Vitals/Pain Pain Assessment Pain Assessment: Faces Faces Pain Scale: Hurts a little bit Pain Location: stomac due to distened Pain Descriptors / Indicators: Discomfort Pain Intervention(s): Monitored during  session    Home Living Family/patient expects to be discharged to:: Private residence Living Arrangements: Alone Available Help at Discharge: Family;Available PRN/intermittently Type of Home: House Home Access: Stairs to enter Entrance Stairs-Rails: Right;Left;Can reach both Entrance Stairs-Number of Steps: 3   Home Layout: One level Home Equipment: Agricultural Consultant (2 wheels);Cane - single point;Grab bars - tub/shower      Prior Function Prior Level of Function : Independent/Modified Independent;Driving             Mobility Comments: Community ambulation using RW PRN, drives ADLs Comments: Independent, playing golf, driving     Extremity/Trunk Assessment   Upper Extremity Assessment Upper Extremity Assessment: Defer to OT evaluation    Lower Extremity Assessment Lower Extremity Assessment: Generalized weakness    Cervical / Trunk Assessment Cervical / Trunk Assessment: Normal  Communication   Communication Communication: No apparent difficulties    Cognition Arousal: Alert Behavior During Therapy: WFL for tasks assessed/performed                             Following commands: Intact       Cueing Cueing Techniques: Verbal cues     General Comments      Exercises     Assessment/Plan    PT Assessment Patient needs continued PT services  PT Problem List Decreased strength;Decreased activity tolerance;Decreased balance;Decreased mobility       PT Treatment Interventions DME instruction;Gait training;Stair training;Functional mobility training;Therapeutic activities;Therapeutic exercise;Balance training;Patient/family education    PT Goals (Current goals can be found in the Care Plan section)  Acute Rehab PT Goals Patient Stated Goal: return home with family to assist PT Goal Formulation: With patient Time For Goal Achievement: 04/16/24 Potential to Achieve Goals: Good    Frequency Min 3X/week     Co-evaluation PT/OT/SLP  Co-Evaluation/Treatment: Yes Reason for Co-Treatment: To address functional/ADL transfers PT goals addressed during session: Mobility/safety with mobility;Balance         AM-PAC PT 6 Clicks Mobility  Outcome Measure Help needed turning from your back to your side while in a flat bed without using bedrails?: None Help needed moving from lying on your back to sitting on the side of a flat bed without using bedrails?: None Help needed moving to and from a bed to a chair (including a wheelchair)?: A Little Help needed standing up from a chair using your arms (e.g., wheelchair or bedside chair)?: A Little Help needed to walk in hospital room?: A Little Help needed climbing 3-5 steps with a railing? : A Lot 6 Click Score: 19    End of Session   Activity Tolerance: Patient tolerated treatment well;Patient limited by fatigue Patient left: in chair;with call bell/phone within reach;with family/visitor present Nurse Communication: Mobility status PT Visit Diagnosis: Unsteadiness on feet (R26.81);Other abnormalities of gait and mobility (R26.89);Muscle weakness (generalized) (M62.81)    Time: 1050-1108 PT Time Calculation (min) (ACUTE ONLY): 18 min   Charges:   PT Evaluation $PT Eval Low Complexity: 1 Low PT Treatments $Therapeutic Activity: 8-22 mins PT General Charges $$ ACUTE PT VISIT: 1 Visit         5:12 PM, 04/12/24 Lynwood Music, MPT Physical Therapist with [facility] [phone] office [phone] mobile phone

## 2024-04-12 NOTE — Plan of Care (Signed)

## 2024-04-12 NOTE — TOC Initial Note (Addendum)
 Transition of Care [facility]) - Initial/Assessment Note    Patient Details  Name: Cody Palmer MRN: [id number] Date of Birth: June 04, 1932  Transition of Care [facility]) CM/SW Contact:    Sharlyne Stabs, RN Phone Number: 04/12/2024, 2:14 PM  Clinical Narrative:     Patient admitted with acute kidney injury. PT is recommending HHPT. CM at the bedside, patient is agreeable to home health PT , no preferences. He has DME that is needed in the home. Referral sent in the hub, IPCM following.               [facility] accepted, MD aware to place orders.   Expected Discharge Plan: Home w Home Health Services Barriers to Discharge: Continued Medical Work up   Patient Goals and CMS Choice Patient states their goals for this hospitalization and ongoing recovery are:: return home CMS Medicare.gov Compare Post Acute Care list provided to:: Patient Choice offered to / list presented to : Patient [place] ownership interest in [facility].provided to:: Patient    Expected Discharge Plan and Services      Living arrangements for the past 2 months: Single Family Home                      Prior Living Arrangements/Services Living arrangements for the past 2 months: Single Family Home Lives with:: Self Patient language and need for interpreter reviewed:: Yes Do you feel safe going back to the place where you live?: Yes      Need for Family Participation in Patient Care: Yes (Comment) Care giver support system in place?: Yes (comment)   Criminal Activity/Legal Involvement Pertinent to Current Situation/Hospitalization: No - Comment as needed  Activities of Daily Living   ADL Screening (condition at time of admission) Independently performs ADLs?: Yes (appropriate for developmental age) Is the patient deaf or have difficulty hearing?: No Does the patient have difficulty seeing, even when wearing glasses/contacts?: No Does the patient have difficulty concentrating, remembering, or making  decisions?: No     Emotional Assessment     Affect (typically observed): Accepting Orientation: : Oriented to Self, Oriented to Place, Oriented to  Time, Oriented to Situation Alcohol / Substance Use: Not Applicable Psych Involvement: No (comment)  Admission diagnosis:  AKI (acute kidney injury) [N17.9] Patient Active Problem List   Diagnosis Date Noted   AKI (acute kidney injury) 04/10/2024   Generalized weakness 04/10/2024   Failure to thrive in adult 04/10/2024   DNR (do not resuscitate) 04/10/2024   Abdominal distention 04/04/2024   Arthritis of left knee 07/12/2022   Osteoarthritis of left knee 07/12/2022   Chronic kidney disease, stage 3a (HCC) 12/07/2021   Essential hypertension 03/08/2021   Hyperlipidemia 03/08/2021   Type 2 diabetes mellitus without complications (HCC) 03/08/2021   Osteoarthrosis, unspecified whether generalized or localized, lower leg 12/31/2013   S/P total knee replacement 02/21/2013   Stiffness of right knee 01/28/2013   Weakness of right leg 01/28/2013   Difficulty walking 01/28/2013   Arthritis of knee, degenerative 12/13/2012   PCP:  [name], NP Pharmacy:   [facility] [place] [place] - [address] [place] [address] Phone: [phone] Fax: [phone]     Social Drivers of Health (SDOH) Social History: SDOH Screenings   Food Insecurity: No Food Insecurity (04/10/2024)  Housing: Low Risk  (04/10/2024)  Transportation Needs: No Transportation Needs (04/10/2024)  Utilities: Not At Risk (04/10/2024)  Social Connections: Moderately Isolated (04/10/2024)  Tobacco Use: Medium Risk (04/10/2024)   SDOH Interventions:

## 2024-04-12 NOTE — Progress Notes (Signed)
 PROGRESS NOTE   Cody Palmer  FMW:[id number] DOB: 12/03/1932 DOA: 04/10/2024 PCP: [name], NP   Chief Complaint  Patient presents with   Weakness   Level of care: Med-Surg  Brief Admission History:  89 year old male with stage IIIa CKD, hypertension, hyperlipidemia, type 2 diabetes mellitus, controlled, recently hospitalized for abdominal distention and concern for colitis versus lymphoma.  He was discharged 4 days ago to home.  He returns today due to poor oral intake, almost no appetite, poor fluid consumption and failure to thrive.  He has become increasingly more weak.  He was seen by GI and had colonoscopy on 04/05/2024 with findings of polyps but otherwise pretty normal.  He also had been seen by oncology during last hospital stay and arrangements made for outpatient PET scan on 04/18/2024 and then will proceed with a lymph node biopsy with IR.  Patient has been having nausea and taking Zofran , abdominal distention persist, poor oral intake persists.  He has been having loose diarrheal stools.  Today he is noted to have another AKI and started on IV fluid hydration.  Admission was requested for further management.   Assessment and Plan:  AKI on CKD stage 3a -- due to poor oral intake and diarrhea -- agree with IV fluid hydration, continue today as he is not back to baseline renal function -- follow BMP as creatinine trends down -- avoiding nephrotoxins   Essential hypertension  -- stable, resume home meds as able -- BPs currently soft    Controlled type 2 DM  -- sensitive SSI coverage ordered  CBG (last 3)  Recent Labs    04/11/24 2146 04/12/24 0738 04/12/24 1110  GLUCAP 109* 93 129*   Generalized Weakness -- secondary to metabolic derangements  -- addressing as noted -- encourage ambulation   Adult failure to thrive -- he remains weak, failed recent discharge home -- not sure he can live alone -- hopefully he improves with supportive  measures   Abdominal distension Question of abdominal lymphoma -- ongoing outpatient work up  -- he is to have a PET scan outpatient on 12/4 -- he is to see Dr. Davonna with oncology on 12/18 -- he is to have IR lymph node biopsy after PET scan -- US  ascites did not show significant findings of ascites  -- acute abdomen series ordered     DVT prophylaxis: heparin  Code Status: DNR  Family Communication: daughter bedside updated Disposition: anticipate home in 1-2 days if he continues to improve   Consultants:   Procedures:   Antimicrobials:    Subjective: Pt reports ongoing minimal appetite, abdominal bloating, having BMs, no abd pain, urinating better. Tolerates liquids but not really wanting to eat or drink.    Objective: Vitals:   04/11/24 0341 04/11/24 1400 04/11/24 2031 04/12/24 0501  BP: (!) 116/56 136/62 137/67 (!) 130/59  Pulse: 90 100 (!) 108 (!) 104  Resp: 20 18 20 20   Temp: 98.1 F (36.7 C) 98 F (36.7 C) 98.2 F (36.8 C) (!) 97.4 F (36.3 C)  TempSrc: Oral Oral Oral Oral  SpO2: 93% 94% 92% 92%  Weight:      Height:        Intake/Output Summary (Last 24 hours) at 04/12/2024 1203 Last data filed at 04/12/2024 9062 Gross per 24 hour  Intake 1243.93 ml  Output --  Net 1243.93 ml   Filed Weights   04/10/24 1006 04/10/24 1357  Weight: 92.4 kg 93.4 kg   Examination:  General exam:  Appears calm and comfortable  Respiratory system: Clear to auscultation. Respiratory effort normal. Cardiovascular system: normal S1 & S2 heard. No JVD, murmurs, rubs, gallops or clicks. No pedal edema. Gastrointestinal system: Abdomen is distended, soft and nontender. No organomegaly or masses felt. Normal bowel sounds heard. Central nervous system: Alert and oriented. No focal neurological deficits. Extremities: Symmetric 5 x 5 power. Skin: No rashes, lesions or ulcers. Psychiatry: Judgement and insight appear normal. Mood & affect appropriate.   Data Reviewed: I have  personally reviewed following labs and imaging studies  CBC: Recent Labs  Lab 04/06/24 0435 04/10/24 1046 04/11/24 0435  WBC 3.2* 5.0 4.4  NEUTROABS  --  2.9  --   HGB 10.3* 9.3* 8.9*  HCT 31.8* 28.9* 28.0*  MCV 83.0 83.8 84.3  PLT 141* 153 138*    Basic Metabolic Panel: Recent Labs  Lab 04/06/24 0435 04/10/24 1046 04/11/24 0435 04/12/24 0454  NA 141 134* 138 139  K 4.2 5.0 4.3 4.3  CL 105 100 104 106  CO2 24 20* 20* 21*  GLUCOSE 77 102* 79 94  BUN 27* 60* 56* 43*  CREATININE 1.11 2.45* 1.97* 1.45*  CALCIUM  8.3* 8.3* 7.9* 8.2*  MG 2.5*  --   --   --     CBG: Recent Labs  Lab 04/11/24 1131 04/11/24 1619 04/11/24 2146 04/12/24 0738 04/12/24 1110  GLUCAP 115* 105* 109* 93 129*    No results found for this or any previous visit (from the past 240 hours).   Radiology Studies: US  ASCITES (ABDOMEN LIMITED) Result Date: 04/11/2024 CLINICAL DATA:  Abdominal distension.  Assess for ascites. EXAM: LIMITED ABDOMEN ULTRASOUND FOR ASCITES TECHNIQUE: Limited ultrasound survey for ascites was performed in all four abdominal quadrants. COMPARISON:  CT, 04/04/2024. FINDINGS: Small amount of ascites, most evident in the lower quadrants. This overall appears increased compared to the prior CT. IMPRESSION: 1. Small amount of ascites. Electronically Signed   By: Alm Parkins M.D.   On: 04/11/2024 12:38    Scheduled Meds:  amLODipine   10 mg Oral Daily   aspirin  EC  81 mg Oral Daily   feeding supplement  237 mL Oral BID BM   fluticasone  2 spray Each Nare Daily   heparin   5,000 Units Subcutaneous Q8H   insulin  aspart  0-9 Units Subcutaneous TID WC   loratadine  10 mg Oral Daily   pantoprazole   40 mg Oral QPM   Continuous Infusions:  sodium chloride  40 mL/hr at 04/12/24 0858    LOS: 2 days   Time spent: 55 mins  Tulani Kidney Vicci, MD How to contact the [facility] Attending or Consulting provider 7A - 7P or covering provider during after hours 7P -7A, for this patient?  Check  the care team in [facility] and look for a) attending/consulting TRH provider listed and b) the TRH team listed Log into [email or web address] to find provider on call.  Locate the TRH provider you are looking for under [facility] and page to a number that you can be directly reached. If you still have difficulty reaching the provider, please page the [facility] (Director on Call) for the Hospitalists listed on amion for assistance.  04/12/2024, 12:03 PM

## 2024-04-13 DIAGNOSIS — R627 Adult failure to thrive: Secondary | ICD-10-CM | POA: Diagnosis not present

## 2024-04-13 DIAGNOSIS — R531 Weakness: Secondary | ICD-10-CM | POA: Diagnosis not present

## 2024-04-13 DIAGNOSIS — R140 Abdominal distension (gaseous): Secondary | ICD-10-CM | POA: Diagnosis not present

## 2024-04-13 DIAGNOSIS — N179 Acute kidney failure, unspecified: Secondary | ICD-10-CM | POA: Diagnosis not present

## 2024-04-13 LAB — BASIC METABOLIC PANEL WITH GFR
Anion gap: 13 (ref 5–15)
BUN: 43 mg/dL — ABNORMAL HIGH (ref 8–23)
CO2: 20 mmol/L — ABNORMAL LOW (ref 22–32)
Calcium: 8.3 mg/dL — ABNORMAL LOW (ref 8.9–10.3)
Chloride: 106 mmol/L (ref 98–111)
Creatinine, Ser: 1.53 mg/dL — ABNORMAL HIGH (ref 0.61–1.24)
GFR, Estimated: 43 mL/min — ABNORMAL LOW (ref >60)
Glucose, Bld: 81 mg/dL (ref 70–99)
Potassium: 4.5 mmol/L (ref 3.5–5.1)
Sodium: 139 mmol/L (ref 135–145)

## 2024-04-13 LAB — GLUCOSE, CAPILLARY
Glucose-Capillary: 81 mg/dL (ref 70–99)
Glucose-Capillary: 121 mg/dL — ABNORMAL HIGH (ref 70–99)
Glucose-Capillary: 99 mg/dL (ref 70–99)
Glucose-Capillary: 89 mg/dL (ref 70–99)

## 2024-04-13 MED ORDER — OXYCODONE HCL 5 MG PO TABS
5.0000 mg | ORAL_TABLET | Freq: Four times a day (QID) | ORAL | Status: DC | PRN
  Administered 2024-04-15 – 2024-04-21 (×2): 5 mg via ORAL
  Filled 2024-04-13 (×2): qty 1

## 2024-04-13 MED ORDER — LABETALOL HCL 200 MG PO TABS
200.0000 mg | ORAL_TABLET | Freq: Two times a day (BID) | ORAL | Status: DC
  Administered 2024-04-13 – 2024-04-19 (×13): 200 mg via ORAL
  Filled 2024-04-13 (×14): qty 1

## 2024-04-13 MED ORDER — SODIUM CHLORIDE 0.9 % IV SOLN: INTRAVENOUS | Status: AC

## 2024-04-13 MED ORDER — SIMETHICONE 80 MG PO CHEW
80.0000 mg | CHEWABLE_TABLET | Freq: Four times a day (QID) | ORAL | Status: DC
  Administered 2024-04-13 – 2024-04-24 (×44): 80 mg via ORAL
  Filled 2024-04-13 (×45): qty 1

## 2024-04-13 NOTE — Progress Notes (Addendum)
 9140: RN made Dr Vicci aware that patient HR in 120s, not on tele,and no c/o pain currently  MD ordered EKG, NS at 40 ml/hr IV, and labetolol po  0910: Made RT aware of EKG, will come complete.   9053: Labetolol po given as scheduled.  1034: RN completed EKG.   1055: RN made Dr Vicci aware that EKG completed, ST 120s, and report states ischemia . MD replied stable EKG. No further orders.   1407: HR 87  1439: RN made Dr Vicci aware that patient hgb 10.9 on admission and now is 8.9. No bleeding noted or reported. Scheduled heparin  SQ due, RN asked if MD would like this given as scheduled. MD replied, yes give scheduled heparin  as ordered, CBC ordered to be checked in am.

## 2024-04-13 NOTE — Progress Notes (Signed)
 PROGRESS NOTE   Cody Palmer  FMW:[id number] DOB: 1932-12-23 DOA: 04/10/2024 PCP: [name], NP   Chief Complaint  Patient presents with   Weakness   Level of care: Med-Surg  Brief Admission History:  89 year old male with stage IIIa CKD, hypertension, hyperlipidemia, type 2 diabetes mellitus, controlled, recently hospitalized for abdominal distention and concern for colitis versus lymphoma.  He was discharged 4 days ago to home.  He returns today due to poor oral intake, almost no appetite, poor fluid consumption and failure to thrive.  He has become increasingly more weak.  He was seen by GI and had colonoscopy on 04/05/2024 with findings of polyps but otherwise pretty normal.  He also had been seen by oncology during last hospital stay and arrangements made for outpatient PET scan on 04/18/2024 and then will proceed with a lymph node biopsy with IR.  Patient has been having nausea and taking Zofran , abdominal distention persist, poor oral intake persists.  He has been having loose diarrheal stools.  Today he is noted to have another AKI and started on IV fluid hydration.  Admission was requested for further management.   Assessment and Plan:  AKI on CKD stage 3a -- due to poor oral intake and diarrhea -- agree with IV fluid hydration, continue today as he is not back to baseline renal function -- follow BMP as creatinine trends down -- avoiding nephrotoxins   Essential hypertension  -- stable, resume home meds as able -- resume home labetalol  at reduced dose of 200 mg BID with hold parameters -- if tolerated we can hopefully titrate back to home dose of 300 mg BID     Controlled type 2 DM  -- sensitive SSI coverage ordered  CBG (last 3)  Recent Labs    04/12/24 2004 04/13/24 0714 04/13/24 1119  GLUCAP 99 81 121*   Generalized Weakness -- secondary to metabolic derangements  -- addressing as noted -- encourage ambulation   Adult failure to thrive --  he remains weak, failed recent discharge home -- not sure he can live alone -- hopefully he improves with supportive measures   Abdominal distension Question of abdominal lymphoma -- ongoing outpatient work up  -- he is to have a PET scan outpatient on 12/4 -- he is to see Dr. Davonna with oncology on 12/18 -- he is to have IR lymph node biopsy after PET scan -- US  ascites did not show significant findings of ascites  -- acute abdomen series ordered    DVT prophylaxis: heparin  Code Status: DNR  Family Communication: daughter bedside updated Disposition: anticipate home when stabilized    Consultants:   Procedures:   Antimicrobials:    Subjective: Pt says he wants to try to eat some breakfast today.      Objective: Vitals:   04/12/24 2112 04/13/24 0117 04/13/24 0511 04/13/24 0849  BP: 123/64 (!) 137/59 139/69 120/78  Pulse: (!) 115 (!) 107 (!) 104 (!) 123  Resp: 18 18 20 18   Temp: 98.6 F (37 C) 98.6 F (37 C) 98.3 F (36.8 C) 97.7 F (36.5 C)  TempSrc: Oral   Oral  SpO2: 94% 94% 93% 94%  Weight:      Height:        Intake/Output Summary (Last 24 hours) at 04/13/2024 1154 Last data filed at 04/12/2024 1337 Gross per 24 hour  Intake 240 ml  Output --  Net 240 ml   Filed Weights   04/10/24 1006 04/10/24 1357  Weight: 92.4  kg 93.4 kg   Examination:  General exam: Appears calm and comfortable  Respiratory system: Clear to auscultation. Respiratory effort normal. Cardiovascular system: normal S1 & S2 heard. No JVD, murmurs, rubs, gallops or clicks. No pedal edema. Gastrointestinal system: Abdomen remains distended, soft and nontender. No organomegaly or masses felt. Normal bowel sounds heard. Central nervous system: Alert and oriented. No focal neurological deficits. Extremities: Symmetric 5 x 5 power. Skin: No rashes, lesions or ulcers. Psychiatry: Judgement and insight appear normal. Mood & affect appropriate.   Data Reviewed: I have personally reviewed  following labs and imaging studies  CBC: Recent Labs  Lab 04/10/24 1046 04/11/24 0435  WBC 5.0 4.4  NEUTROABS 2.9  --   HGB 9.3* 8.9*  HCT 28.9* 28.0*  MCV 83.8 84.3  PLT 153 138*    Basic Metabolic Panel: Recent Labs  Lab 04/10/24 1046 04/11/24 0435 04/12/24 0454 04/13/24 0315  NA 134* 138 139 139  K 5.0 4.3 4.3 4.5  CL 100 104 106 106  CO2 20* 20* 21* 20*  GLUCOSE 102* 79 94 81  BUN 60* 56* 43* 43*  CREATININE 2.45* 1.97* 1.45* 1.53*  CALCIUM  8.3* 7.9* 8.2* 8.3*    CBG: Recent Labs  Lab 04/12/24 1110 04/12/24 1613 04/12/24 2004 04/13/24 0714 04/13/24 1119  GLUCAP 129* 96 99 81 121*    No results found for this or any previous visit (from the past 240 hours).   Radiology Studies: DG Abd 2 Views Result Date: 04/12/2024 CLINICAL DATA:  Abdominal distention EXAM: ABDOMEN - 2 VIEW COMPARISON:  Abdominal radiograph dated 04/04/2024 FINDINGS: Nonobstructive bowel gas pattern. Relative paucity of bowel gas within the right lower quadrant and central abdomen. No free air or pneumatosis. No abnormal radio-opaque calculi or mass effect. No acute or substantial osseous abnormality. The sacrum and coccyx are partially obscured by overlying bowel contents. IMPRESSION: Nonobstructive bowel gas pattern. Relative paucity of bowel gas within the right lower quadrant and central abdomen may reflect underdistended/compressed or fluid-filled bowel loops. Electronically Signed   By: Limin  Xu M.D.   On: 04/12/2024 16:21    Scheduled Meds:  amLODipine   10 mg Oral Daily   aspirin  EC  81 mg Oral Daily   feeding supplement  237 mL Oral BID BM   fluticasone   2 spray Each Nare Daily   heparin   5,000 Units Subcutaneous Q8H   insulin  aspart  0-9 Units Subcutaneous TID WC   labetalol   200 mg Oral BID   loratadine   10 mg Oral Daily   pantoprazole   40 mg Oral QPM   simethicone   80 mg Oral QID   Continuous Infusions:  sodium chloride  40 mL/hr at 04/13/24 0946    LOS: 3 days   Time  spent: 55 mins  Ra Pfiester Vicci, MD How to contact the [facility] Attending or Consulting provider 7A - 7P or covering provider during after hours 7P -7A, for this patient?  Check the care team in [facility] and look for a) attending/consulting TRH provider listed and b) the TRH team listed Log into [email or web address] to find provider on call.  Locate the TRH provider you are looking for under [facility] and page to a number that you can be directly reached. If you still have difficulty reaching the provider, please page the [facility] (Director on Call) for the Hospitalists listed on amion for assistance.  04/13/2024, 11:54 AM

## 2024-04-13 NOTE — Plan of Care (Signed)

## 2024-04-14 ENCOUNTER — Inpatient Hospital Stay (HOSPITAL_COMMUNITY)

## 2024-04-14 DIAGNOSIS — R627 Adult failure to thrive: Secondary | ICD-10-CM | POA: Diagnosis not present

## 2024-04-14 DIAGNOSIS — R531 Weakness: Secondary | ICD-10-CM | POA: Diagnosis not present

## 2024-04-14 DIAGNOSIS — N179 Acute kidney failure, unspecified: Secondary | ICD-10-CM | POA: Diagnosis not present

## 2024-04-14 DIAGNOSIS — R140 Abdominal distension (gaseous): Secondary | ICD-10-CM | POA: Diagnosis not present

## 2024-04-14 LAB — GLUCOSE, CAPILLARY
Glucose-Capillary: 90 mg/dL (ref 70–99)
Glucose-Capillary: 110 mg/dL — ABNORMAL HIGH (ref 70–99)
Glucose-Capillary: 106 mg/dL — ABNORMAL HIGH (ref 70–99)
Glucose-Capillary: 114 mg/dL — ABNORMAL HIGH (ref 70–99)

## 2024-04-14 LAB — CBC
HCT: 29.6 % — ABNORMAL LOW (ref 39.0–52.0)
Hemoglobin: 9.3 g/dL — ABNORMAL LOW (ref 13.0–17.0)
MCH: 26.8 pg (ref 26.0–34.0)
MCHC: 31.4 g/dL (ref 30.0–36.0)
MCV: 85.3 fL (ref 80.0–100.0)
Platelets: 172 K/uL (ref 150–400)
RBC: 3.47 MIL/uL — ABNORMAL LOW (ref 4.22–5.81)
RDW: 17.5 % — ABNORMAL HIGH (ref 11.5–15.5)
WBC: 4.9 K/uL (ref 4.0–10.5)
nRBC: 0 % (ref 0.0–0.2)

## 2024-04-14 LAB — BASIC METABOLIC PANEL WITH GFR
Anion gap: 13 (ref 5–15)
BUN: 48 mg/dL — ABNORMAL HIGH (ref 8–23)
CO2: 21 mmol/L — ABNORMAL LOW (ref 22–32)
Calcium: 8.5 mg/dL — ABNORMAL LOW (ref 8.9–10.3)
Chloride: 105 mmol/L (ref 98–111)
Creatinine, Ser: 1.91 mg/dL — ABNORMAL HIGH (ref 0.61–1.24)
GFR, Estimated: 33 mL/min — ABNORMAL LOW (ref >60)
Glucose, Bld: 96 mg/dL (ref 70–99)
Potassium: 5.1 mmol/L (ref 3.5–5.1)
Sodium: 139 mmol/L (ref 135–145)

## 2024-04-14 MED ORDER — SODIUM CHLORIDE 0.9 % IV SOLN: INTRAVENOUS | Status: AC

## 2024-04-14 NOTE — Plan of Care (Signed)

## 2024-04-14 NOTE — Progress Notes (Signed)
 PROGRESS NOTE   Cody Palmer  FMW:[id number] DOB: [birth year] DOA: 04/10/2024 PCP: [name], NP   Chief Complaint  Patient presents with   Weakness   Level of care: Med-Surg  Brief Admission History:  89 year old male with stage IIIa CKD, hypertension, hyperlipidemia, type 2 diabetes mellitus, controlled, recently hospitalized for abdominal distention and concern for colitis versus lymphoma.  He was discharged 4 days ago to home.  He returns today due to poor oral intake, almost no appetite, poor fluid consumption and failure to thrive.  He has become increasingly more weak.  He was seen by GI and had colonoscopy on 04/05/2024 with findings of polyps but otherwise pretty normal.  He also had been seen by oncology during last hospital stay and arrangements made for outpatient PET scan on 04/18/2024 and then will proceed with a lymph node biopsy with IR.  Patient has been having nausea and taking Zofran , abdominal distention persist, poor oral intake persists.  He has been having loose diarrheal stools.  Today he is noted to have another AKI and started on IV fluid hydration.  Admission was requested for further management.   Assessment and Plan:  AKI on CKD stage 3a -- due to poor oral intake and diarrhea -- initially started to improve but now trending back up -- check bladder scans to rule out obstruction, check renal US  -- will ask nephrologist to see 12/1  -- agree with IV fluid hydration, continue today as he is not back to baseline renal function and poor oral intake -- follow BMP as creatinine trends down -- avoiding nephrotoxins   Essential hypertension  -- stable, resume home meds as able -- resume home labetalol  at reduced dose of 200 mg BID with hold parameters -- if tolerated we can hopefully titrate back to home dose of 300 mg BID     Controlled type 2 DM  -- sensitive SSI coverage ordered  CBG (last 3)  Recent Labs    04/13/24 2144 04/14/24 0713  04/14/24 1133  GLUCAP 89 90 110*   Generalized Weakness -- secondary to metabolic derangements  -- addressing as noted -- encourage ambulation   Adult failure to thrive -- he remains weak, failed recent discharge home -- not sure he can live alone -- hopefully he improves with supportive measures   Abdominal distension Question of abdominal lymphoma -- ongoing outpatient work up  -- he is to have a PET scan outpatient on 12/4 -- he is to see Dr. Davonna with oncology on 12/18 -- he is to have IR lymph node biopsy after PET scan -- US  ascites did not show significant findings of ascites  -- acute abdomen series ordered    DVT prophylaxis: heparin  Code Status: DNR  Family Communication: daughter bedside updated Disposition: anticipate home when stabilized    Consultants:   Procedures:   Antimicrobials:    Subjective: Pt has very poor appetite and not wanting to eat or drink, still has a lot of gas in the abdomen and having flatus, no BM yesterday but had a large BM the day before but it was very loose from taking miralax  and we had to place miralax  on hold.       Objective: Vitals:   04/13/24 1407 04/13/24 2013 04/14/24 0411 04/14/24 0904  BP: (!) 122/55 (!) 131/55 (!) 117/47 135/65  Pulse: 87 93 77 90  Resp: 18 (!) 24 16 18   Temp: 98 F (36.7 C) 98.4 F (36.9 C) 98.3 F (36.8 C)  97.6 F (36.4 C)  TempSrc: Oral Oral Oral Oral  SpO2: 94% 95% 95% 93%  Weight:      Height:        Intake/Output Summary (Last 24 hours) at 04/14/2024 1302 Last data filed at 04/14/2024 0815 Gross per 24 hour  Intake 923.63 ml  Output 200 ml  Net 723.63 ml   Filed Weights   04/10/24 1006 04/10/24 1357  Weight: 92.4 kg 93.4 kg   Examination:  General exam: Appears calm and comfortable  Respiratory system: Clear to auscultation. Respiratory effort normal. Cardiovascular system: normal S1 & S2 heard. No JVD, murmurs, rubs, gallops or clicks. No pedal edema. Gastrointestinal  system: Abdomen remains distended, soft and nontender. No organomegaly or masses felt. Normal bowel sounds heard. Central nervous system: Alert and oriented. No focal neurological deficits. Extremities: Symmetric 5 x 5 power. Skin: No rashes, lesions or ulcers. Psychiatry: Judgement and insight appear normal. Mood & affect appropriate.   Data Reviewed: I have personally reviewed following labs and imaging studies  CBC: Recent Labs  Lab 04/10/24 1046 04/11/24 0435 04/14/24 0525  WBC 5.0 4.4 4.9  NEUTROABS 2.9  --   --   HGB 9.3* 8.9* 9.3*  HCT 28.9* 28.0* 29.6*  MCV 83.8 84.3 85.3  PLT 153 138* 172    Basic Metabolic Panel: Recent Labs  Lab 04/10/24 1046 04/11/24 0435 04/12/24 0454 04/13/24 0315 04/14/24 0525  NA 134* 138 139 139 139  K 5.0 4.3 4.3 4.5 5.1  CL 100 104 106 106 105  CO2 20* 20* 21* 20* 21*  GLUCOSE 102* 79 94 81 96  BUN 60* 56* 43* 43* 48*  CREATININE 2.45* 1.97* 1.45* 1.53* 1.91*  CALCIUM  8.3* 7.9* 8.2* 8.3* 8.5*    CBG: Recent Labs  Lab 04/13/24 1119 04/13/24 1606 04/13/24 2144 04/14/24 0713 04/14/24 1133  GLUCAP 121* 99 89 90 110*    No results found for this or any previous visit (from the past 240 hours).   Radiology Studies: No results found.   Scheduled Meds:  amLODipine   10 mg Oral Daily   aspirin  EC  81 mg Oral Daily   feeding supplement  237 mL Oral BID BM   fluticasone  2 spray Each Nare Daily   heparin   5,000 Units Subcutaneous Q8H   insulin  aspart  0-9 Units Subcutaneous TID WC   labetalol   200 mg Oral BID   loratadine  10 mg Oral Daily   pantoprazole   40 mg Oral QPM   simethicone   80 mg Oral QID   Continuous Infusions:    LOS: 4 days   Time spent: 55 mins  Mieko Kneebone Vicci, MD How to contact the [facility] Attending or Consulting provider 7A - 7P or covering provider during after hours 7P -7A, for this patient?  Check the care team in [facility] and look for a) attending/consulting TRH provider listed and b) the TRH team  listed Log into [email or web address] to find provider on call.  Locate the TRH provider you are looking for under [facility] and page to a number that you can be directly reached. If you still have difficulty reaching the provider, please page the [facility] (Director on Call) for the Hospitalists listed on amion for assistance.  04/14/2024, 1:02 PM

## 2024-04-15 ENCOUNTER — Inpatient Hospital Stay (HOSPITAL_COMMUNITY)

## 2024-04-15 ENCOUNTER — Other Ambulatory Visit: Payer: Self-pay | Admitting: Oncology

## 2024-04-15 DIAGNOSIS — N179 Acute kidney failure, unspecified: Secondary | ICD-10-CM | POA: Diagnosis not present

## 2024-04-15 DIAGNOSIS — Z66 Do not resuscitate: Secondary | ICD-10-CM | POA: Diagnosis not present

## 2024-04-15 DIAGNOSIS — R531 Weakness: Secondary | ICD-10-CM | POA: Diagnosis not present

## 2024-04-15 DIAGNOSIS — R627 Adult failure to thrive: Secondary | ICD-10-CM | POA: Diagnosis not present

## 2024-04-15 LAB — URINALYSIS, ROUTINE W REFLEX MICROSCOPIC
Bilirubin Urine: NEGATIVE
Glucose, UA: NEGATIVE mg/dL
Hgb urine dipstick: NEGATIVE
Ketones, ur: NEGATIVE mg/dL
Leukocytes,Ua: NEGATIVE
Nitrite: NEGATIVE
Protein, ur: NEGATIVE mg/dL
Specific Gravity, Urine: 1.016 (ref 1.005–1.030)
pH: 5 (ref 5.0–8.0)

## 2024-04-15 LAB — BASIC METABOLIC PANEL WITH GFR
Anion gap: 13 (ref 5–15)
BUN: 51 mg/dL — ABNORMAL HIGH (ref 8–23)
CO2: 19 mmol/L — ABNORMAL LOW (ref 22–32)
Calcium: 8.2 mg/dL — ABNORMAL LOW (ref 8.9–10.3)
Chloride: 106 mmol/L (ref 98–111)
Creatinine, Ser: 1.9 mg/dL — ABNORMAL HIGH (ref 0.61–1.24)
GFR, Estimated: 33 mL/min — ABNORMAL LOW (ref >60)
Glucose, Bld: 96 mg/dL (ref 70–99)
Potassium: 4.7 mmol/L (ref 3.5–5.1)
Sodium: 138 mmol/L (ref 135–145)

## 2024-04-15 LAB — CREATININE, URINE, RANDOM: Creatinine, Urine: 207 mg/dL

## 2024-04-15 LAB — GLUCOSE, CAPILLARY
Glucose-Capillary: 98 mg/dL (ref 70–99)
Glucose-Capillary: 224 mg/dL — ABNORMAL HIGH (ref 70–99)
Glucose-Capillary: 86 mg/dL (ref 70–99)
Glucose-Capillary: 101 mg/dL — ABNORMAL HIGH (ref 70–99)

## 2024-04-15 LAB — CBC
HCT: 27 % — ABNORMAL LOW (ref 39.0–52.0)
Hemoglobin: 8.6 g/dL — ABNORMAL LOW (ref 13.0–17.0)
MCH: 27 pg (ref 26.0–34.0)
MCHC: 31.9 g/dL (ref 30.0–36.0)
MCV: 84.9 fL (ref 80.0–100.0)
Platelets: 158 K/uL (ref 150–400)
RBC: 3.18 MIL/uL — ABNORMAL LOW (ref 4.22–5.81)
RDW: 17.7 % — ABNORMAL HIGH (ref 11.5–15.5)
WBC: 4 K/uL (ref 4.0–10.5)
nRBC: 0 % (ref 0.0–0.2)

## 2024-04-15 LAB — SODIUM, URINE, RANDOM: Sodium, Ur: <30 mmol/L

## 2024-04-15 MED ORDER — IOHEXOL 9 MG/ML PO SOLN
500.0000 mL | ORAL | Status: AC
  Administered 2024-04-15 (×2): 500 mL via ORAL

## 2024-04-15 MED ORDER — IOHEXOL 9 MG/ML PO SOLN
ORAL | Status: AC
  Filled 2024-04-15: qty 1000

## 2024-04-15 MED ORDER — ALLOPURINOL 300 MG PO TABS: 300.0000 mg | ORAL_TABLET | Freq: Every day | ORAL | 0 refills | Status: DC

## 2024-04-15 MED ORDER — CHLORHEXIDINE GLUCONATE CLOTH 2 % EX PADS
6.0000 | MEDICATED_PAD | Freq: Every day | CUTANEOUS | Status: DC
  Administered 2024-04-15 – 2024-04-24 (×10): 6 via TOPICAL

## 2024-04-15 NOTE — Progress Notes (Signed)
   04/15/24 1031  Advance Directives (For Healthcare)  Does Patient Have a Medical Advance Directive? Yes  Does patient want to make changes to medical advance directive? No - Patient declined  Type of Estate Agent of [place];Living will  Copy of Healthcare Power of Attorney in Chart? Yes - validated most recent copy scanned in chart (See row information)  Copy of Living Will in Chart? Yes - validated most recent copy scanned in chart (See row information)  Would patient like information on creating a medical advance directive? No - Patient declined  Mental Health Advance Directives  Would patient like information on creating a mental health advance directive? No - Patient declined   Met with Mr. Bollier and made certain that his HCPOA/Living Will document was brought in by his daughter. Found copy of ACP in his physical chart and updated EPIC regarding his wishes. Copy to be scanned into his chart upon discharge.   Rev. Antino Mayabb Chaplain

## 2024-04-15 NOTE — Progress Notes (Signed)
 Mobility Specialist Progress Note:    04/15/24 0915  Mobility  Activity Ambulated with assistance  Level of Assistance Contact guard assist, steadying assist  Assistive Device None  Distance Ambulated (ft) 16 ft  Range of Motion/Exercises Active;All extremities  Activity Response Tolerated well  Mobility Referral Yes  Mobility visit 1 Mobility  Mobility Specialist Start Time (ACUTE ONLY) 0915  Mobility Specialist Stop Time (ACUTE ONLY) 0935  Mobility Specialist Time Calculation (min) (ACUTE ONLY) 20 min   Pt received in bed, agreeable to mobility. Required CGA to stand and ambulate with no AD. Tolerated well, left in chair. Call bell in reach, all needs met.  Marisol Giambra Mobility Specialist Please contact via Special Educational Needs Teacher or  Rehab office at [phone]

## 2024-04-15 NOTE — Progress Notes (Signed)
Pt going down to CT via wheelchair.

## 2024-04-15 NOTE — Progress Notes (Signed)
 PROGRESS NOTE   Cody Palmer  FMW:[id number] DOB: [birth year] DOA: 04/10/2024 PCP: [name], NP   Chief Complaint  Patient presents with   Weakness   Level of care: Med-Surg  Brief Admission History:  Cody Palmer is a 89 year old male with stage IIIa CKD, hypertension, hyperlipidemia, type 2 diabetes mellitus, controlled, recently hospitalized for abdominal distention and concern for colitis versus lymphoma.  He was discharged 4 days ago to home.  He returns today due to poor oral intake, almost no appetite, poor fluid consumption and failure to thrive.  He has become increasingly more weak.  He was seen by GI and had colonoscopy on 04/05/2024 with findings of polyps but otherwise pretty normal.  He also had been seen by oncology during last hospital stay and arrangements made for outpatient PET scan on 04/18/2024 and then will proceed with a lymph node biopsy with IR.  Patient has been having nausea and taking Zofran , abdominal distention persist, poor oral intake persists.  He has been having loose diarrheal stools.  Today he is noted to have another AKI and started on IV fluid hydration.  Admission was requested for further management.    Assessment and Plan:  AKI on CKD stage 3a -- due to poor oral intake and diarrhea -- initially started to improve but now trending back up -- check bladder scans to rule out obstruction, check renal US  -- Patient nephrologist assistance and recommendation; Foley catheter has been placed. --CT scan of the abdomen has been ordered as recommended by nephrology. -- agree with IV fluid hydration, continue today as he is not back to baseline renal function and poor oral intake -- Continue to follow BMP as creatinine trends down -- Continue avoiding nephrotoxins   Essential hypertension  -- stable, resume home meds as able -- resume home labetalol  at reduced dose of 200 mg BID with hold parameters --follow VS and further resume  antihypertensive agents as needed.   Controlled type 2 DM  -- sensitive SSI coverage ordered  CBG (last 3)  Recent Labs    04/15/24 0736 04/15/24 1111 04/15/24 1630  GLUCAP 98 224* 86   Generalized Weakness -- secondary to metabolic derangements  -- encourage ambulation and physical interaction  --might need significant assistance at discharge   Adult failure to thrive -- he remains weak, failed recent discharge home -- not sure he can live alone -- hopefully he improves with supportive measures --follow clinical response   Abdominal distension Question of abdominal lymphoma -- ongoing outpatient work up  -- he is to have a PET scan outpatient on 12/4 -- he is to see Dr. Davonna with oncology on 12/18 -- he is to have IR lymph node biopsy after PET scan - acute abdomen series demonstrating not SBO -given further ongoing distension CT scan with oral contrast has been ordered.   DVT prophylaxis: heparin  Code Status: DNR  Family Communication: daughter bedside updated Disposition: anticipate home when stabilized    Consultants:   Procedures:   Antimicrobials:    Subjective: Patient reports decreased appetite and expressed having some diarrhea about 48 hours ago when trying to eat anything.  Abdomen has demonstrated further distention and following nephrology rec's foley catheter insertion has been placed.  Objective: Vitals:   04/14/24 2016 04/15/24 0507 04/15/24 0951 04/15/24 1359  BP: (!) 139/59 (!) 91/45 (!) 126/50 (!) 105/50  Pulse: 89 79  77  Resp: 19 19  20   Temp: 97.8 F (36.6 C) 98.1 F (36.7  C)  98.4 F (36.9 C)  TempSrc: Oral Oral  Oral  SpO2: 93% 93%  97%  Weight:      Height:        Intake/Output Summary (Last 24 hours) at 04/15/2024 1846 Last data filed at 04/14/2024 2016 Gross per 24 hour  Intake 240 ml  Output --  Net 240 ml   Filed Weights   04/10/24 1006 04/10/24 1357  Weight: 92.4 kg 93.4 kg   Examination: General exam: Alert,  awake, oriented x 3 and following commands. Respiratory system: Reporting mild difficulty taking full breaths; good saturation on room air. Cardiovascular system: Rate controlled, no rubs, no gallops, no JVD. Gastrointestinal system: Abdomen is significantly distended and tense; patient reports no tenderness on palpation.  Distant bowel sounds appreciated on exam. Central nervous system: Moving 4 limbs spontaneously.  No focal neurological deficits. Extremities: No cyanosis or clubbing. Skin: No petechiae. Psychiatry: Judgement and insight appear normal. Mood & affect appropriate.   Data Reviewed: I have personally reviewed following labs and imaging studies  CBC: Recent Labs  Lab 04/10/24 1046 04/11/24 0435 04/14/24 0525 04/15/24 0436  WBC 5.0 4.4 4.9 4.0  NEUTROABS 2.9  --   --   --   HGB 9.3* 8.9* 9.3* 8.6*  HCT 28.9* 28.0* 29.6* 27.0*  MCV 83.8 84.3 85.3 84.9  PLT 153 138* 172 158    Basic Metabolic Panel: Recent Labs  Lab 04/11/24 0435 04/12/24 0454 04/13/24 0315 04/14/24 0525 04/15/24 0436  NA 138 139 139 139 138  K 4.3 4.3 4.5 5.1 4.7  CL 104 106 106 105 106  CO2 20* 21* 20* 21* 19*  GLUCOSE 79 94 81 96 96  BUN 56* 43* 43* 48* 51*  CREATININE 1.97* 1.45* 1.53* 1.91* 1.90*  CALCIUM  7.9* 8.2* 8.3* 8.5* 8.2*    CBG: Recent Labs  Lab 04/14/24 1724 04/14/24 2044 04/15/24 0736 04/15/24 1111 04/15/24 1630  GLUCAP 106* 114* 98 224* 86    No results found for this or any previous visit (from the past 240 hours).   Radiology Studies: US  RENAL Result Date: 04/14/2024 CLINICAL DATA:  Acute kidney injury EXAM: RENAL / URINARY TRACT ULTRASOUND COMPLETE COMPARISON:  CT abdomen pelvis dated 04/04/2024 FINDINGS: Right Kidney: Length = 11.5 cm Normal parenchymal echogenicity with preserved corticomedullary differentiation. No urinary tract dilation or shadowing calculi. The ureter is not seen. Left Kidney: Length = 11.1 cm Normal parenchymal echogenicity with  preserved corticomedullary differentiation. No urinary tract dilation or shadowing calculi. The ureter is not seen. Bladder: Appears normal for degree of bladder distention. Other: Partially imaged small volume ascites. IMPRESSION: 1. No urinary tract dilation or shadowing calculi. Normal cortical echogenicity. 2. Partially imaged small volume ascites. Electronically Signed   By: Limin  Xu M.D.   On: 04/14/2024 15:42     Scheduled Meds:  amLODipine   10 mg Oral Daily   aspirin  EC  81 mg Oral Daily   Chlorhexidine  Gluconate Cloth  6 each Topical Daily   feeding supplement  237 mL Oral BID BM   fluticasone   2 spray Each Nare Daily   heparin   5,000 Units Subcutaneous Q8H   insulin  aspart  0-9 Units Subcutaneous TID WC   labetalol   200 mg Oral BID   loratadine   10 mg Oral Daily   pantoprazole   40 mg Oral QPM   simethicone   80 mg Oral QID   Continuous Infusions:    LOS: 5 days   Time spent: 50 mins  Eric Nunnery,  MD How to contact the [facility] Attending or Consulting provider 7A - 7P or covering provider during after hours 7P -7A, for this patient?  Check the care team in [facility] and look for a) attending/consulting [facility] provider listed and b) the [facility] team listed Log into [email or web address] to find provider on call.  Locate the [facility] provider you are looking for under [facility] and page to a number that you can be directly reached. If you still have difficulty reaching the provider, please page the [facility] (Director on Call) for the Hospitalists listed on amion for assistance.  04/15/2024, 6:46 PM

## 2024-04-15 NOTE — Consult Note (Signed)
 Reason for Consult:AKI Referring Physician:  Vicci, MD  Cody Palmer is an 89 y.o. male with a PMH significant for DM, HTN, DJD, HLD, GERD, and recent hospitalization for abdominal distention and concern for colitis vs lymphoma.  He was discharged on 04/06/24 after colonoscopy and IVF's for AKI.  He again presented to [facility] ED on 04/10/24 with ongoing anorexia, poor po intake, and FTT.  He was again noted to have AKI and was given IVF's with improvement of Scr but now starting to see a rise in Scr again.  The trend in Scr is seen below. Renal US  yesterday without obstruction and preserved corticomedullary differentiation.  He had been on ARB-HCT and Jardiance , which were continued after discharge on 04/04/24 but stopped during this admission.  We were consulted to further evaluate and manage his recurrent AKI.   He has had poor po intake and weight loss for the past year per his daughter who is at the bedside.  He was taking Losartan  HCT and Jardiance  prior to admission.  He did get 2 IV contrasted studies at his last admission.  He admits to urinary hesitancy and retention.  He denies any dysuria, pyuria, hematuria, or incontinence.   Trend in Creatinine: Creatinine, Ser  Date/Time Value Ref Range Status  04/15/2024 04:36 AM 1.90 (H) 0.61 - 1.24 mg/dL Final  88/69/7974 94:74 AM 1.91 (H) 0.61 - 1.24 mg/dL Final  88/70/7974 96:84 AM 1.53 (H) 0.61 - 1.24 mg/dL Final  88/71/7974 95:45 AM 1.45 (H) 0.61 - 1.24 mg/dL Final  88/72/7974 95:64 AM 1.97 (H) 0.61 - 1.24 mg/dL Final  88/73/7974 89:53 AM 2.45 (H) 0.61 - 1.24 mg/dL Final  88/77/7974 95:64 AM 1.11 0.61 - 1.24 mg/dL Final  88/78/7974 95:44 AM 1.27 (H) 0.61 - 1.24 mg/dL Final  88/79/7974 90:69 AM 1.45 (H) 0.61 - 1.24 mg/dL Final  88/87/7974 96:86 PM 1.22 0.76 - 1.27 mg/dL Final  88/90/7974 88:84 AM 1.09 0.61 - 1.24 mg/dL Final  97/71/7975 94:95 AM 0.88 0.61 - 1.24 mg/dL Final  97/77/7975 87:44 PM 1.04 0.61 - 1.24 mg/dL Final  90/95/7977  92:64 PM 0.74 0.61 - 1.24 mg/dL Final  87/94/7982 97:40 PM 0.90 0.61 - 1.24 mg/dL Final  97/80/7984 92:47 PM 1.02 0.50 - 1.35 mg/dL Final  91/69/7985 94:79 AM 1.13 0.50 - 1.35 mg/dL Final  91/74/7985 88:99 AM 1.13 0.50 - 1.35 mg/dL Final    PMH:   Past Medical History:  Diagnosis Date   Arthritis    Cancer (HCC)    Skin cancer   Diabetes mellitus without complication (HCC)    HTN (hypertension)    Hypercholesteremia     PSH:   Past Surgical History:  Procedure Laterality Date   CATARACT EXTRACTION W/PHACO Left 04/25/2016   Procedure: CATARACT EXTRACTION PHACO AND INTRAOCULAR LENS PLACEMENT LEFT EYE;  Surgeon: Cherene Mania, MD;  Location: [facility];  Service: Ophthalmology;  Laterality: Left;  CDE: 10.37   COLONOSCOPY N/A 04/05/2024   Procedure: COLONOSCOPY;  Surgeon: Cindie Carlin POUR, DO;  Location: [facility];  Service: Endoscopy;  Laterality: N/A;   CYSTOSCOPY WITH BIOPSY N/A 08/13/2013   Procedure: CYSTOSCOPY WITH BLADDER BIOPSY;  Surgeon: Garnette Shack, MD;  Location: [facility];  Service: Urology;  Laterality: N/A;   ENUCLEATION Right    Removed in 1958   EYE SURGERY  1958   removal of eye   TOTAL KNEE ARTHROPLASTY Right 01/11/2013   Procedure: RIGHT TOTAL KNEE ARTHROPLASTY;  Surgeon: Taft FORBES Minerva, MD;  Location: [facility];  Service: Orthopedics;  Laterality: Right;   TOTAL KNEE ARTHROPLASTY Left 07/12/2022   Procedure: TOTAL KNEE ARTHROPLASTY;  Surgeon: Margrette Taft BRAVO, MD;  Location: [facility];  Service: Orthopedics;  Laterality: Left;    Allergies: No Known Allergies  Medications:   Prior to Admission medications   Medication Sig Start Date End Date Taking? Authorizing Provider  amLODipine  (NORVASC ) 10 MG tablet Take 10 mg by mouth daily.   Yes [provider]  aspirin  EC 81 MG tablet Take 81 mg by mouth daily. Swallow whole.   Yes [provider]  Cholecalciferol  (VITAMIN D3) 25 MCG (1000 UT) CAPS Take 1,000 Units by mouth daily.   Yes [provider]  JARDIANCE  25 MG TABS tablet Take 12.5 mg by mouth daily. 12/09/20  Yes [provider]  labetalol  (NORMODYNE ) 300 MG tablet Take 300 mg by mouth 2 (two) times daily. 11/20/20  Yes [provider]  losartan -hydrochlorothiazide  (HYZAAR) 100-25 MG per tablet Take 1 tablet by mouth daily.   Yes [provider]  metFORMIN  (GLUCOPHAGE ) 500 MG tablet Take 500 mg by mouth 2 (two) times daily with a meal.   Yes [provider]  Multiple Vitamins-Minerals (EYE HEALTH AREDS 2 PO) Take 1 capsule by mouth 2 (two) times daily.   Yes [provider]  ondansetron  (ZOFRAN ) 4 MG tablet Take 1 tablet (4 mg total) by mouth daily as needed for nausea or vomiting. 04/06/24 04/06/25 Yes Shah, Pratik D, DO  pantoprazole  (PROTONIX ) 40 MG tablet Take 1 tablet (40 mg total) by mouth daily. 30 minutes before breakfast 03/27/24  Yes Shirlean Therisa ORN, NP  polyethylene glycol (MIRALAX ) 17 g packet Take 17 g by mouth daily as needed for mild constipation or moderate constipation. 04/06/24  Yes Shah, Pratik D, DO  rosuvastatin  (CRESTOR ) 10 MG tablet Take 10 mg by mouth every other day.   Yes [provider]  simethicone  (MYLICON) 80 MG chewable tablet Chew 1 tablet (80 mg total) by mouth every 6 (six) hours as needed for flatulence. 04/06/24  Yes Maree, Pratik D, DO    Inpatient medications:  amLODipine   10 mg Oral Daily   aspirin  EC  81 mg Oral Daily   feeding supplement  237 mL Oral BID BM   fluticasone  2 spray Each Nare Daily   heparin   5,000 Units Subcutaneous Q8H   insulin  aspart  0-9 Units Subcutaneous TID WC   labetalol   200 mg Oral BID   loratadine  10 mg Oral Daily   pantoprazole   40 mg Oral QPM   simethicone   80 mg Oral QID    Discontinued Meds:   Medications Discontinued During This Encounter  Medication Reason   0.9 %  sodium chloride  infusion    0.9 %  sodium chloride  infusion    simethicone  (MYLICON) chewable tablet 80 mg    polyethylene  glycol (MIRALAX  / GLYCOLAX ) packet 17 g    simethicone  (MYLICON) chewable tablet 160 mg    oxyCODONE  (Oxy IR/ROXICODONE ) immediate release tablet 5 mg     Social History:  reports that he quit smoking about 59 years ago. His smoking use included cigarettes. He started smoking about 67 years ago. He has a 8 pack-year smoking history. He has never used smokeless tobacco. He reports that he does not drink alcohol and does not use drugs.  Family History:   Family History  Problem Relation Age of Onset   Arthritis Unknown    Cancer Unknown    Diabetes  Unknown     Pertinent items are noted in HPI. Weight change:   Intake/Output Summary (Last 24 hours) at 04/15/2024 0802 Last data filed at 04/14/2024 2016 Gross per 24 hour  Intake 304.33 ml  Output 200 ml  Net 104.33 ml   BP (!) 91/45 (BP Location: Right Arm)   Pulse 79   Temp 98.1 F (36.7 C) (Oral)   Resp 19   Ht 5' 9 (1.753 m)   Wt 93.4 kg   SpO2 93%   BMI 30.42 kg/m  Vitals:   04/14/24 1357 04/14/24 1825 04/14/24 2016 04/15/24 0507  BP: (!) 114/56 (!) 123/54 (!) 139/59 (!) 91/45  Pulse: 76 81 89 79  Resp:  18 19 19   Temp:  98.3 F (36.8 C) 97.8 F (36.6 C) 98.1 F (36.7 C)  TempSrc:  Oral Oral Oral  SpO2:  92% 93% 93%  Weight:      Height:         General appearance: fatigued, mild distress, and moderately obese Head: Normocephalic, without obvious abnormality, atraumatic Resp: clear to auscultation bilaterally Cardio: regular rate and rhythm, S1, S2 normal, no murmur, click, rub or gallop GI: distended, tense, hypoactive bowel sounds, + tenderness to palpation Extremities: edema trace pretibial edema  Labs: Basic Metabolic Panel: Recent Labs  Lab 04/10/24 1046 04/11/24 0435 04/12/24 0454 04/13/24 0315 04/14/24 0525 04/15/24 0436  NA 134* 138 139 139 139 138  K 5.0 4.3 4.3 4.5 5.1 4.7  CL 100 104 106 106 105 106  CO2 20* 20* 21* 20* 21* 19*  GLUCOSE 102* 79 94 81 96 96  BUN 60* 56* 43* 43* 48* 51*   CREATININE 2.45* 1.97* 1.45* 1.53* 1.91* 1.90*  ALBUMIN 3.6  --   --   --   --   --   CALCIUM  8.3* 7.9* 8.2* 8.3* 8.5* 8.2*   Liver Function Tests: Recent Labs  Lab 04/10/24 1046  AST 70*  ALT 50*  ALKPHOS 79  BILITOT 0.6  PROT 5.6*  ALBUMIN 3.6   No results for input(s): LIPASE, AMYLASE in the last 168 hours. No results for input(s): AMMONIA in the last 168 hours. CBC: Recent Labs  Lab 04/10/24 1046 04/11/24 0435 04/14/24 0525 04/15/24 0436  WBC 5.0 4.4 4.9 4.0  NEUTROABS 2.9  --   --   --   HGB 9.3* 8.9* 9.3* 8.6*  HCT 28.9* 28.0* 29.6* 27.0*  MCV 83.8 84.3 85.3 84.9  PLT 153 138* 172 158   PT/INR: @LABRCNTIP (inr:5) Cardiac Enzymes: )No results for input(s): CKTOTAL, CKMB, CKMBINDEX, TROPONINI in the last 168 hours. CBG: Recent Labs  Lab 04/14/24 0713 04/14/24 1133 04/14/24 1724 04/14/24 2044 04/15/24 0736  GLUCAP 90 110* 106* 114* 98    Iron Studies: No results for input(s): IRON, TIBC, TRANSFERRIN, FERRITIN in the last 168 hours.  Xrays/Other Studies: US  RENAL Result Date: 04/14/2024 CLINICAL DATA:  Acute kidney injury EXAM: RENAL / URINARY TRACT ULTRASOUND COMPLETE COMPARISON:  CT abdomen pelvis dated 04/04/2024 FINDINGS: Right Kidney: Length = 11.5 cm Normal parenchymal echogenicity with preserved corticomedullary differentiation. No urinary tract dilation or shadowing calculi. The ureter is not seen. Left Kidney: Length = 11.1 cm Normal parenchymal echogenicity with preserved corticomedullary differentiation. No urinary tract dilation or shadowing calculi. The ureter is not seen. Bladder: Appears normal for degree of bladder distention. Other: Partially imaged small volume ascites. IMPRESSION: 1. No urinary tract dilation or shadowing calculi. Normal cortical echogenicity. 2. Partially imaged small volume ascites. Electronically Signed  By: Limin  Xu M.D.   On: 04/14/2024 15:42     Assessment/Plan:  AKI - likely ischemic ATN due  to combination of volume depletion due to poor po intake along with diarrhea and concomitant ARB/HCT and SGLT2-inhibitor.  Continue to hold losartan  hct and jardiance .  Scr improved with IVF's but worsened after stopping.  Would resume IVF's and follow UOP and Scr.  Recommend foley catheter placement given symptoms of retention and poor UOP documented.  No indication for dialysis at this time.  Also on the differential diagnosis is abdominal compartment syndrome given tense abdominal distention.  Renal US  without obstruction.  Continue to follow UOP and Scr closely.    Avoid nephrotoxic medications including NSAIDs and iodinated intravenous contrast exposure unless the latter is absolutely indicated.   Preferred narcotic agents for pain control are hydromorphone , fentanyl , and methadone. Morphine  should not be used.  Avoid Baclofen and avoid oral sodium phosphate  and magnesium  citrate based laxatives / bowel preps.  Continue strict Input and Output monitoring.  Will monitor the patient closely with you and intervene or adjust therapy as indicated by changes in clinical status/labs  Abdominal distention - worsening since admission per daughter.  Enlarging mesenteric mass concerning for lymphoma.  Would repeat imaging but with oral contrast, no IV contrast.  Gi following.  Was to have PET scan on 04/18/24 and will need lymph node biopsy.  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    Abnormal LFT's - unclear etiology as CT of abd and pelvis without cirrhosis or suspicious focal abnormality.  Avoid hepatotoxic agents.   Fairy A Karcyn Menn 04/15/2024, 8:02 AM

## 2024-04-15 NOTE — Plan of Care (Signed)

## 2024-04-16 DIAGNOSIS — E440 Moderate protein-calorie malnutrition: Secondary | ICD-10-CM | POA: Insufficient documentation

## 2024-04-16 DIAGNOSIS — R627 Adult failure to thrive: Secondary | ICD-10-CM | POA: Diagnosis not present

## 2024-04-16 DIAGNOSIS — Z66 Do not resuscitate: Secondary | ICD-10-CM | POA: Diagnosis not present

## 2024-04-16 DIAGNOSIS — R531 Weakness: Secondary | ICD-10-CM | POA: Diagnosis not present

## 2024-04-16 DIAGNOSIS — N179 Acute kidney failure, unspecified: Secondary | ICD-10-CM | POA: Diagnosis not present

## 2024-04-16 LAB — GLUCOSE, CAPILLARY
Glucose-Capillary: 95 mg/dL (ref 70–99)
Glucose-Capillary: 125 mg/dL — ABNORMAL HIGH (ref 70–99)
Glucose-Capillary: 136 mg/dL — ABNORMAL HIGH (ref 70–99)
Glucose-Capillary: 203 mg/dL — ABNORMAL HIGH (ref 70–99)

## 2024-04-16 LAB — LACTATE DEHYDROGENASE: LDH: 1249 U/L — ABNORMAL HIGH (ref 105–235)

## 2024-04-16 LAB — RENAL FUNCTION PANEL
Albumin: 3.2 g/dL — ABNORMAL LOW (ref 3.5–5.0)
Anion gap: 15 (ref 5–15)
BUN: 59 mg/dL — ABNORMAL HIGH (ref 8–23)
CO2: 18 mmol/L — ABNORMAL LOW (ref 22–32)
Calcium: 8.2 mg/dL — ABNORMAL LOW (ref 8.9–10.3)
Chloride: 104 mmol/L (ref 98–111)
Creatinine, Ser: 2.35 mg/dL — ABNORMAL HIGH (ref 0.61–1.24)
GFR, Estimated: 26 mL/min — ABNORMAL LOW (ref >60)
Glucose, Bld: 91 mg/dL (ref 70–99)
Phosphorus: 5.3 mg/dL — ABNORMAL HIGH (ref 2.5–4.6)
Potassium: 5.2 mmol/L — ABNORMAL HIGH (ref 3.5–5.1)
Sodium: 137 mmol/L (ref 135–145)

## 2024-04-16 LAB — HEPATIC FUNCTION PANEL
ALT: 35 U/L (ref 0–44)
AST: 61 U/L — ABNORMAL HIGH (ref 15–41)
Albumin: 3.2 g/dL — ABNORMAL LOW (ref 3.5–5.0)
Alkaline Phosphatase: 74 U/L (ref 38–126)
Bilirubin, Direct: 0.3 mg/dL — ABNORMAL HIGH (ref 0.0–0.2)
Indirect Bilirubin: 0.2 mg/dL — ABNORMAL LOW (ref 0.3–0.9)
Total Bilirubin: 0.5 mg/dL (ref 0.0–1.2)
Total Protein: 5.1 g/dL — ABNORMAL LOW (ref 6.5–8.1)

## 2024-04-16 MED ORDER — STERILE WATER FOR INJECTION IV SOLN
INTRAVENOUS | Status: DC
  Filled 2024-04-16 (×13): qty 1000

## 2024-04-16 MED ORDER — ADULT MULTIVITAMIN W/MINERALS CH
1.0000 | ORAL_TABLET | Freq: Every day | ORAL | Status: DC
  Administered 2024-04-16 – 2024-04-24 (×9): 1 via ORAL
  Filled 2024-04-16 (×9): qty 1

## 2024-04-16 MED ORDER — SODIUM ZIRCONIUM CYCLOSILICATE 10 G PO PACK
10.0000 g | PACK | Freq: Every day | ORAL | Status: DC
  Administered 2024-04-16: 10 g via ORAL
  Filled 2024-04-16 (×2): qty 1

## 2024-04-16 MED ORDER — BOOST / RESOURCE BREEZE PO LIQD CUSTOM
1.0000 | Freq: Two times a day (BID) | ORAL | Status: DC
  Administered 2024-04-16 – 2024-04-20 (×2): 1 via ORAL

## 2024-04-16 NOTE — Consult Note (Signed)
 [facility]  CONSULTING PHYSICIAN: Dr. Maree  REASON FOR CONSULT: Abdominal mass    HISTORY OF PRESENT ILLNESS:   Cody Palmer is a 89 y.o. male with past medical history of hypertension, type 2 diabetes, hyperlipidemia, CKD and severe enteritis who presented to the emergency room with progressive weakness, abdominal distention, urinary retention, poor oral intake and nausea.  GI was consulted and patient had colonoscopy today which shows concerns for abdominal lymphoma.  He had a CT abdomen/pelvis which showed ill-defined soft tissue density throughout the base of the mesentery with diffuse stranding of mesenteric fat and new ascites.  Findings are nonspecific and potentially still inflammatory but suspicious for lymphoma given the associated enlarged pericardiac, retroperitoneal and inguinal lymph nodes.  CT chest showed no evidence of thoracic metastasis, infiltrative process in the upper abdominal mesentery suggestive of peritoneal carcinomatosis.  Patient was discharged on 04/06/2024 with follow-up with us .    Labs from 04/06/24 showed elevated uric acid levels 13.3. He was started on Allopurinol  daily.   He presented back to the emergency room on 04/10/2024 for poor oral intake, no appetite, poor fluid consumption and failure to thrive.  He had also become progressively weak with nausea.   He was scheduled for PET scan and outpatient biopsy but those were not until next week.  Reports abdominal distention and he is scheduled for a ultrasound-guided paracentesis.  Most recent CT scan is from yesterday which showed grossly similar ill-defined soft tissue throughout the base of the mesentery, increased small to moderate abdominal pelvic ascites since 03/27/2024, wall thickening around the terminal ileum and ascending and transverse colon, gross similar to prior given differences in contrast administration.  MEDICATIONS: I have reviewed the patient's  current medications.     PERFORMANCE STATUS: The patient's performance status is 1 - Symptomatic but completely ambulatory  PHYSICAL EXAM: Most Recent Vital Signs: Blood pressure (!) 134/103, pulse 92, temperature 97.8 F (36.6 C), temperature source Oral, resp. rate 12, height 6' (1.829 m), weight 270 lb (122.5 kg), SpO2 91%.  GENERAL:alert, no distress and comfortable SKIN: skin color, texture, turgor are normal, no rashes or significant lesions EYES: normal, conjunctiva are pink and non-injected, sclera clear OROPHARYNX:no exudate, no erythema and lips, buccal mucosa, and tongue normal  NECK: supple, thyroid normal size, non-tender, without nodularity LYMPH:  no palpable lymphadenopathy in the cervical, axillary or inguinal LUNGS: clear to auscultation and percussion with normal breathing effort HEART: regular rate & rhythm and no murmurs and no lower extremity edema ABDOMEN:abdomen soft, non-tender and normal bowel sounds Musculoskeletal:no cyanosis of digits and no clubbing  PSYCH: alert & oriented x 3 with fluent speech NEURO: no focal motor/sensory deficits    LABORATORY DATA:   Last CBC Lab Results  Component Value Date   WBC 4.0 04/15/2024   HGB 8.6 (L) 04/15/2024   HCT 27.0 (L) 04/15/2024   MCV 84.9 04/15/2024   MCH 27.0 04/15/2024   RDW 17.7 (H) 04/15/2024   PLT 158 04/15/2024     Last metabolic panel Lab Results  Component Value Date   GLUCOSE 91 04/16/2024   NA 137 04/16/2024   K 5.2 (H) 04/16/2024   CL 104 04/16/2024   CO2 18 (L) 04/16/2024   BUN 59 (H) 04/16/2024   CREATININE 2.35 (H) 04/16/2024   GFRNONAA 26 (L) 04/16/2024   CALCIUM  8.2 (L) 04/16/2024   PHOS 5.3 (H) 04/16/2024   PROT 5.1 (L) 04/16/2024   ALBUMIN  3.2 (L) 04/16/2024   ALBUMIN   3.2 (L) 04/16/2024   LABGLOB 2.4 03/27/2024   BILITOT 0.5 04/16/2024   ALKPHOS 74 04/16/2024   AST 61 (H) 04/16/2024   ALT 35 04/16/2024   ANIONGAP 15 04/16/2024      RADIOGRAPHY: CT ABDOMEN PELVIS WO  CONTRAST EXAM: CT ABDOMEN AND PELVIS WITHOUT CONTRAST 04/15/2024 10:17:55 PM  TECHNIQUE: CT of the abdomen and pelvis was performed without the administration of intravenous contrast. Multiplanar reformatted images are provided for review. Automated exposure control, iterative reconstruction, and/or weight-based adjustment of the mA/kV was utilized to reduce the radiation dose to as low as reasonably achievable.  COMPARISON: Comparison with the 04/04/2024  CLINICAL HISTORY: Abdominal pain, acute, nonlocalized.  FINDINGS:  LOWER CHEST: Trace bilateral pleural effusions.  LIVER: The liver is unremarkable.  GALLBLADDER AND BILE DUCTS: Similar reactive gallbladder wall thickening. No biliary ductal dilatation.  SPLEEN: No acute abnormality.  PANCREAS: Unchanged ill-defined soft tissue about the pancreatic head. This was better assessed on prior CT with contrast.  ADRENAL GLANDS: No acute abnormality.  KIDNEYS, URETERS AND BLADDER: No stones in the kidneys or ureters. No hydronephrosis. No perinephric or periureteral stranding. Foley catheter in the decompressed bladder.  GI AND BOWEL: Oral contrast is present within the stomach and small bowel. There is dilute contrast in the colon. Wall thickening about the terminal ileum and ascending and transverse colon is grossly similar to prior given differences in contrast administration. There is no bowel obstruction.  PERITONEUM AND RETROPERITONEUM: Increased small to moderate abdominal pelvic ascites. Diffuse mesenteric stranding is similar. Redemonstrated ill-defined soft tissue density throughout the base of the mesentery. This was better evaluated with CT with IV contrast on 04/04/2024 but is grossly similar. No free air.  VASCULATURE: Aorta is normal in caliber. Aortic atherosclerotic calcification.  LYMPH NODES: Unchanged left periaortic lymphadenopathy measuring up to 1.7 cm on series 2 image 45. Similar  enlargement of a left inguinal lymph node measuring 1.3 cm. Similar enlargement of a pericardial lymph node measuring 1.8 cm on series 2 image 14.  REPRODUCTIVE ORGANS: No acute abnormality.  BONES AND SOFT TISSUES: No acute osseous abnormality. No focal soft tissue abnormality.  IMPRESSION: 1. Grossly similar ill-defined soft tissue throughout the base of the mesentery. 2. Increased small to moderate abdominopelvic ascites since 11 / 12 / 25 . 3. Wall thickening about the terminal ileum and ascending and transverse colon, grossly similar to prior given differences in contrast administration. 4. Unchanged left periaortic, pericardiac, and left inguinal lymphadenopathy.  Electronically signed by: Norman Gatlin MD 04/16/2024 02:38 AM EST RP Workstation: [id number]      ASSESSMENT: Cody Palmer is an 89 year old male who presented for abdominal bloating and found to have abdominal mass concerning for lymphoma.  PLAN:  Spoke with Dr. Davonna who recommends biopsy ASAP-please arrange for this to be done while he is admitted. Based on patient's labs, there is concern for TLS given elevated uric acid and elevated potassium levels. Patient needs serial labs for trending every 12 hours including LDH, uric acid, CBC and CMP. Treat hyperkalemia. Patient needs continuous IV hydration.  Thank you for involving us  in this patient's care.  Please to reach out with any questions or concerns.  Delon Hope, AGNP-C Department of Hematology/Oncology [facility]  Phone: [phone]  04/16/2024 3:14 PM

## 2024-04-16 NOTE — Progress Notes (Signed)
 Patient ID: Cody Palmer, male   DOB: 04-11-1933, 89 y.o.   MRN: [id number] S: Reports UOP after foley placed but not documented.  Still with abdominal distention and discomfort. O:BP (!) 110/48 (BP Location: Right Arm)   Pulse 86   Temp 98.2 F (36.8 C) (Oral)   Resp 18   Ht 5' 9 (1.753 m)   Wt 93.4 kg   SpO2 95%   BMI 30.42 kg/m   Intake/Output Summary (Last 24 hours) at 04/16/2024 1002 Last data filed at 04/15/2024 2240 Gross per 24 hour  Intake 200 ml  Output --  Net 200 ml   Intake/Output: I/O last 3 completed shifts: In: 440 [P.O.:440] Out: -   Intake/Output this shift:  No intake/output data recorded. Weight change:  Gen: mild distress with tachypnea  CVS: RRR Resp: Decreased BS at bases Abd: distended, tense, +BS Ext: minimal pretibial edema  Recent Labs  Lab 04/10/24 1046 04/11/24 0435 04/12/24 0454 04/13/24 0315 04/14/24 0525 04/15/24 0436 04/16/24 0445  NA 134* 138 139 139 139 138 137  K 5.0 4.3 4.3 4.5 5.1 4.7 5.2*  CL 100 104 106 106 105 106 104  CO2 20* 20* 21* 20* 21* 19* 18*  GLUCOSE 102* 79 94 81 96 96 91  BUN 60* 56* 43* 43* 48* 51* 59*  CREATININE 2.45* 1.97* 1.45* 1.53* 1.91* 1.90* 2.35*  ALBUMIN  3.6  --   --   --   --   --  3.2*  3.2*  CALCIUM  8.3* 7.9* 8.2* 8.3* 8.5* 8.2* 8.2*  PHOS  --   --   --   --   --   --  5.3*  AST 70*  --   --   --   --   --  61*  ALT 50*  --   --   --   --   --  35   Liver Function Tests: Recent Labs  Lab 04/10/24 1046 04/16/24 0445  AST 70* 61*  ALT 50* 35  ALKPHOS 79 74  BILITOT 0.6 0.5  PROT 5.6* 5.1*  ALBUMIN  3.6 3.2*  3.2*   No results for input(s): LIPASE, AMYLASE in the last 168 hours. No results for input(s): AMMONIA in the last 168 hours. CBC: Recent Labs  Lab 04/10/24 1046 04/11/24 0435 04/14/24 0525 04/15/24 0436  WBC 5.0 4.4 4.9 4.0  NEUTROABS 2.9  --   --   --   HGB 9.3* 8.9* 9.3* 8.6*  HCT 28.9* 28.0* 29.6* 27.0*  MCV 83.8 84.3 85.3 84.9  PLT 153 138* 172 158    Cardiac Enzymes: No results for input(s): CKTOTAL, CKMB, CKMBINDEX, TROPONINI in the last 168 hours. CBG: Recent Labs  Lab 04/15/24 0736 04/15/24 1111 04/15/24 1630 04/15/24 2129 04/16/24 0745  GLUCAP 98 224* 86 101* 95    Iron Studies: No results for input(s): IRON, TIBC, TRANSFERRIN, FERRITIN in the last 72 hours. Studies/Results: CT ABDOMEN PELVIS WO CONTRAST Result Date: 04/16/2024 EXAM: CT ABDOMEN AND PELVIS WITHOUT CONTRAST 04/15/2024 10:17:55 PM TECHNIQUE: CT of the abdomen and pelvis was performed without the administration of intravenous contrast. Multiplanar reformatted images are provided for review. Automated exposure control, iterative reconstruction, and/or weight-based adjustment of the mA/kV was utilized to reduce the radiation dose to as low as reasonably achievable. COMPARISON: Comparison with the 04/04/2024 CLINICAL HISTORY: Abdominal pain, acute, nonlocalized. FINDINGS: LOWER CHEST: Trace bilateral pleural effusions. LIVER: The liver is unremarkable. GALLBLADDER AND BILE DUCTS: Similar reactive gallbladder wall thickening. No biliary ductal  dilatation. SPLEEN: No acute abnormality. PANCREAS: Unchanged ill-defined soft tissue about the pancreatic head. This was better assessed on prior CT with contrast. ADRENAL GLANDS: No acute abnormality. KIDNEYS, URETERS AND BLADDER: No stones in the kidneys or ureters. No hydronephrosis. No perinephric or periureteral stranding. Foley catheter in the decompressed bladder. GI AND BOWEL: Oral contrast is present within the stomach and small bowel. There is dilute contrast in the colon. Wall thickening about the terminal ileum and ascending and transverse colon is grossly similar to prior given differences in contrast administration. There is no bowel obstruction. PERITONEUM AND RETROPERITONEUM: Increased small to moderate abdominal pelvic ascites. Diffuse mesenteric stranding is similar. Redemonstrated ill-defined soft tissue  density throughout the base of the mesentery. This was better evaluated with CT with IV contrast on 04/04/2024 but is grossly similar. No free air. VASCULATURE: Aorta is normal in caliber. Aortic atherosclerotic calcification. LYMPH NODES: Unchanged left periaortic lymphadenopathy measuring up to 1.7 cm on series 2 image 45. Similar enlargement of a left inguinal lymph node measuring 1.3 cm. Similar enlargement of a pericardial lymph node measuring 1.8 cm on series 2 image 14. REPRODUCTIVE ORGANS: No acute abnormality. BONES AND SOFT TISSUES: No acute osseous abnormality. No focal soft tissue abnormality. IMPRESSION: 1. Grossly similar ill-defined soft tissue throughout the base of the mesentery. 2. Increased small to moderate abdominopelvic ascites since 11 / 12 / 25 . 3. Wall thickening about the terminal ileum and ascending and transverse colon, grossly similar to prior given differences in contrast administration. 4. Unchanged left periaortic, pericardiac, and left inguinal lymphadenopathy. Electronically signed by: Norman Gatlin MD 04/16/2024 02:38 AM EST RP Workstation: [id number]   US  RENAL Result Date: 04/14/2024 CLINICAL DATA:  Acute kidney injury EXAM: RENAL / URINARY TRACT ULTRASOUND COMPLETE COMPARISON:  CT abdomen pelvis dated 04/04/2024 FINDINGS: Right Kidney: Length = 11.5 cm Normal parenchymal echogenicity with preserved corticomedullary differentiation. No urinary tract dilation or shadowing calculi. The ureter is not seen. Left Kidney: Length = 11.1 cm Normal parenchymal echogenicity with preserved corticomedullary differentiation. No urinary tract dilation or shadowing calculi. The ureter is not seen. Bladder: Appears normal for degree of bladder distention. Other: Partially imaged small volume ascites. IMPRESSION: 1. No urinary tract dilation or shadowing calculi. Normal cortical echogenicity. 2. Partially imaged small volume ascites. Electronically Signed   By: Limin  Xu M.D.   On:  04/14/2024 15:42    amLODipine   10 mg Oral Daily   aspirin  EC  81 mg Oral Daily   Chlorhexidine  Gluconate Cloth  6 each Topical Daily   feeding supplement  237 mL Oral BID BM   fluticasone  2 spray Each Nare Daily   heparin   5,000 Units Subcutaneous Q8H   insulin  aspart  0-9 Units Subcutaneous TID WC   labetalol   200 mg Oral BID   loratadine  10 mg Oral Daily   pantoprazole   40 mg Oral QPM   simethicone   80 mg Oral QID   sodium zirconium cyclosilicate  10 g Oral Daily    BMET    Component Value Date/Time   NA 137 04/16/2024 0445   NA 139 03/27/2024 1513   K 5.2 (H) 04/16/2024 0445   CL 104 04/16/2024 0445   CO2 18 (L) 04/16/2024 0445   GLUCOSE 91 04/16/2024 0445   BUN 59 (H) 04/16/2024 0445   BUN 32 (H) 03/27/2024 1513   CREATININE 2.35 (H) 04/16/2024 0445   CALCIUM  8.2 (L) 04/16/2024 0445   GFRNONAA 26 (L) 04/16/2024 0445   GFRAA >  60 04/19/2016 1459   CBC    Component Value Date/Time   WBC 4.0 04/15/2024 0436   RBC 3.18 (L) 04/15/2024 0436   HGB 8.6 (L) 04/15/2024 0436   HGB 12.7 (L) 03/27/2024 1513   HCT 27.0 (L) 04/15/2024 0436   HCT 38.9 03/27/2024 1513   PLT 158 04/15/2024 0436   PLT 126 (L) 03/27/2024 1513   MCV 84.9 04/15/2024 0436   MCV 85 03/27/2024 1513   MCH 27.0 04/15/2024 0436   MCHC 31.9 04/15/2024 0436   RDW 17.7 (H) 04/15/2024 0436   RDW 14.9 03/27/2024 1513   LYMPHSABS 1.4 04/10/2024 1046   LYMPHSABS 3.2 (H) 03/27/2024 1513   MONOABS 0.5 04/10/2024 1046   EOSABS 0.1 04/10/2024 1046   EOSABS 0.1 03/27/2024 1513   BASOSABS 0.0 04/10/2024 1046   BASOSABS 0.1 03/27/2024 1513     Assessment/Plan:  AKI - likely ischemic ATN due to combination of volume depletion due to poor po intake along with diarrhea and concomitant ARB/HCT and SGLT2-inhibitor.  Continue to hold losartan  hct and jardiance .  Scr improved with IVF's but worsened after stopping.  Scr continues to climb, will resume IVF's and follow UOP and Scr.  Continue with foley catheter  placement given symptoms of retention and poor UOP documented.  No indication for dialysis at this time.  Also on the differential diagnosis is abdominal compartment syndrome given tense abdominal distention.  Renal US  without obstruction.  Continue to follow UOP and Scr closely.    Avoid nephrotoxic medications including NSAIDs and iodinated intravenous contrast exposure unless the latter is absolutely indicated.   Preferred narcotic agents for pain control are hydromorphone , fentanyl , and methadone. Morphine  should not be used.  Avoid Baclofen and avoid oral sodium phosphate  and magnesium  citrate based laxatives / bowel preps.  Continue strict Input and Output monitoring.  Will monitor the patient closely with you and intervene or adjust therapy as indicated by changes in clinical status/labs  Abdominal distention - worsening since admission per daughter.  Enlarging mesenteric mass concerning for lymphoma.  Repeat CT scan with po contrast without evidence of SBO.  GI following.  Was to have PET scan on 04/18/24 and lymph node biopsy.  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   Not sure he would need lymph node biopsy to make diagnosis given path report.  Recommend inpatient Heme/Onc consult to possibly start treatment while an inpatient. Hyperkalemia - will start isotonic bicarb due to AGMA and Lokelma 10 gm daily. Increasing ascites - now moderate ascites.  Will consult IR for US  guided paracentesis if possible for patient comfort. Would also send cytology. Abnormal LFT's - unclear etiology as CT of abd and pelvis without cirrhosis or suspicious focal abnormality.  Avoid hepatotoxic agents.  Improving.  Fairy RONAL Sellar, MD [facility]

## 2024-04-16 NOTE — Progress Notes (Signed)
 PROGRESS NOTE   Cody Palmer  FMW:[id number] DOB: 06/29/1932 DOA: 04/10/2024 PCP: [name], NP   Chief Complaint  Patient presents with   Weakness   Level of care: Med-Surg  Brief Admission History:  Cody Palmer is a 89 year old male with stage IIIa CKD, hypertension, hyperlipidemia, type 2 diabetes mellitus, controlled, recently hospitalized for abdominal distention and concern for colitis versus lymphoma.  He was discharged 4 days ago to home.  He returns today due to poor oral intake, almost no appetite, poor fluid consumption and failure to thrive.  He has become increasingly more weak.  He was seen by GI and had colonoscopy on 04/05/2024 with findings of polyps but otherwise pretty normal.  He also had been seen by oncology during last hospital stay and arrangements made for outpatient PET scan on 04/18/2024 and then will proceed with a lymph node biopsy with IR.  Patient has been having nausea and taking Zofran , abdominal distention persist, poor oral intake persists.  He has been having loose diarrheal stools.  Today he is noted to have another AKI and started on IV fluid hydration.  Admission was requested for further management.    Assessment and Plan:  AKI on CKD stage 3a/hyperkalemia -- due to poor oral intake and diarrhea -- initially started to improve but now trending back up --CT scan of the abdomen has been ordered as recommended by nephrology. -- agree with IV fluid hydration, continue today as he is not back to baseline renal function and poor oral intake -- Continue to follow BMP as creatinine trends down -- Continue avoiding nephrotoxins -Lokelma will be given. (Potassium 5.2). - Creatinine has worsened up to 2.35. -Continue to follow nephrology service recommendation.   Essential hypertension  -- stable, resume home meds as able -- resume home labetalol  at reduced dose of 200 mg BID with hold parameters --follow VS and further resume  antihypertensive agents as needed. -Continue to hold the rest of his antihypertensive agents.   Controlled type 2 DM  -- sensitive SSI coverage ordered  CBG (last 3)  Recent Labs    04/16/24 0745 04/16/24 1114 04/16/24 1626  GLUCAP 95 125* 136*   Generalized Weakness -- secondary to metabolic derangements  -- encourage ambulation and physical interaction  --might need significant assistance at discharge   Adult failure to thrive -- he remains weak, failed recent discharge home -- Appreciate assistance and recommendation by dietitian regarding feeding supplements. -- hopefully he improves with supportive measures --follow clinical response   Abdominal distension Question of abdominal lymphoma -- ongoing outpatient work up  -- he is to have a PET scan outpatient on 12/4 -- Oncology service has been consulted. -- he is to have IR lymph node biopsy after PET scan - acute abdomen series demonstrating not SBO - CT scan demonstrating incisional tube dilations without SBO; moderate ascites. Ultrasound paracentesis requested.-   DVT prophylaxis: heparin  Code Status: DNR  Family Communication: daughter bedside updated Disposition: anticipate home when stabilized    Consultants:   Procedures:   Antimicrobials:    Subjective: No fever, no chest pain, no nausea, no vomiting.  Patient reports poor appetite.  Abdomen continues to be distended.  Sudden increase in urine output has been reported by patient after Foley catheter placed.  Renal function and electrolytes (potassium) worsening.  Objective: Vitals:   04/15/24 1359 04/15/24 2053 04/16/24 1014 04/16/24 1418  BP: (!) 105/50 (!) 110/48 (!) 100/54 (!) 88/47  Pulse: 77 86 85 75  Resp: 20 18 17 18   Temp: 98.4 F (36.9 C) 98.2 F (36.8 C) (!) 97.5 F (36.4 C) (!) 97.5 F (36.4 C)  TempSrc: Oral Oral Oral Oral  SpO2: 97% 95% 92% 96%  Weight:      Height:        Intake/Output Summary (Last 24 hours) at 04/16/2024  1737 Last data filed at 04/16/2024 1054 Gross per 24 hour  Intake 440 ml  Output --  Net 440 ml   Filed Weights   04/10/24 1006 04/10/24 1357  Weight: 92.4 kg 93.4 kg   Examination: General exam: Alert, awake, oriented x 3; complaining of decreased appetite.  No overnight events. Respiratory system: Good saturation on room air; no using accessory muscles.  Mild tachypnea with exertion appreciated on exam. Cardiovascular system: Rate controlled, no rubs, no gallops, no JVD. Gastrointestinal system: Abdomen is distended, slightly tense and mildly tender to palpation.  Patient passing gas and reports having bowel movements. Central nervous system: Generally weak; moving 4 limbs spontaneously.  No focal neurological deficits. Extremities: No C/C/E, +pedal pulses Skin: No petechiae. Psychiatry: Judgement and insight appear normal.  Flat affect appreciated on exam.  Data Reviewed: I have personally reviewed following labs and imaging studies  CBC: Recent Labs  Lab 04/10/24 1046 04/11/24 0435 04/14/24 0525 04/15/24 0436  WBC 5.0 4.4 4.9 4.0  NEUTROABS 2.9  --   --   --   HGB 9.3* 8.9* 9.3* 8.6*  HCT 28.9* 28.0* 29.6* 27.0*  MCV 83.8 84.3 85.3 84.9  PLT 153 138* 172 158    Basic Metabolic Panel: Recent Labs  Lab 04/12/24 0454 04/13/24 0315 04/14/24 0525 04/15/24 0436 04/16/24 0445  NA 139 139 139 138 137  K 4.3 4.5 5.1 4.7 5.2*  CL 106 106 105 106 104  CO2 21* 20* 21* 19* 18*  GLUCOSE 94 81 96 96 91  BUN 43* 43* 48* 51* 59*  CREATININE 1.45* 1.53* 1.91* 1.90* 2.35*  CALCIUM  8.2* 8.3* 8.5* 8.2* 8.2*  PHOS  --   --   --   --  5.3*    CBG: Recent Labs  Lab 04/15/24 1630 04/15/24 2129 04/16/24 0745 04/16/24 1114 04/16/24 1626  GLUCAP 86 101* 95 125* 136*    No results found for this or any previous visit (from the past 240 hours).   Radiology Studies: CT ABDOMEN PELVIS WO CONTRAST Result Date: 04/16/2024 EXAM: CT ABDOMEN AND PELVIS WITHOUT CONTRAST  04/15/2024 10:17:55 PM TECHNIQUE: CT of the abdomen and pelvis was performed without the administration of intravenous contrast. Multiplanar reformatted images are provided for review. Automated exposure control, iterative reconstruction, and/or weight-based adjustment of the mA/kV was utilized to reduce the radiation dose to as low as reasonably achievable. COMPARISON: Comparison with the 04/04/2024 CLINICAL HISTORY: Abdominal pain, acute, nonlocalized. FINDINGS: LOWER CHEST: Trace bilateral pleural effusions. LIVER: The liver is unremarkable. GALLBLADDER AND BILE DUCTS: Similar reactive gallbladder wall thickening. No biliary ductal dilatation. SPLEEN: No acute abnormality. PANCREAS: Unchanged ill-defined soft tissue about the pancreatic head. This was better assessed on prior CT with contrast. ADRENAL GLANDS: No acute abnormality. KIDNEYS, URETERS AND BLADDER: No stones in the kidneys or ureters. No hydronephrosis. No perinephric or periureteral stranding. Foley catheter in the decompressed bladder. GI AND BOWEL: Oral contrast is present within the stomach and small bowel. There is dilute contrast in the colon. Wall thickening about the terminal ileum and ascending and transverse colon is grossly similar to prior given differences in contrast administration. There is  no bowel obstruction. PERITONEUM AND RETROPERITONEUM: Increased small to moderate abdominal pelvic ascites. Diffuse mesenteric stranding is similar. Redemonstrated ill-defined soft tissue density throughout the base of the mesentery. This was better evaluated with CT with IV contrast on 04/04/2024 but is grossly similar. No free air. VASCULATURE: Aorta is normal in caliber. Aortic atherosclerotic calcification. LYMPH NODES: Unchanged left periaortic lymphadenopathy measuring up to 1.7 cm on series 2 image 45. Similar enlargement of a left inguinal lymph node measuring 1.3 cm. Similar enlargement of a pericardial lymph node measuring 1.8 cm on series  2 image 14. REPRODUCTIVE ORGANS: No acute abnormality. BONES AND SOFT TISSUES: No acute osseous abnormality. No focal soft tissue abnormality. IMPRESSION: 1. Grossly similar ill-defined soft tissue throughout the base of the mesentery. 2. Increased small to moderate abdominopelvic ascites since 11 / 12 / 25 . 3. Wall thickening about the terminal ileum and ascending and transverse colon, grossly similar to prior given differences in contrast administration. 4. Unchanged left periaortic, pericardiac, and left inguinal lymphadenopathy. Electronically signed by: Norman Gatlin MD 04/16/2024 02:38 AM EST RP Workstation: [id number]     Scheduled Meds:  aspirin  EC  81 mg Oral Daily   Chlorhexidine  Gluconate Cloth  6 each Topical Daily   feeding supplement  1 Container Oral BID BM   fluticasone  2 spray Each Nare Daily   heparin   5,000 Units Subcutaneous Q8H   insulin  aspart  0-9 Units Subcutaneous TID WC   labetalol   200 mg Oral BID   loratadine  10 mg Oral Daily   multivitamin with minerals  1 tablet Oral Daily   pantoprazole   40 mg Oral QPM   simethicone   80 mg Oral QID   sodium zirconium cyclosilicate  10 g Oral Daily   Continuous Infusions:  sodium bicarbonate 150 mEq in sterile water  1,150 mL infusion 75 mL/hr at 04/16/24 1044     LOS: 6 days   Time spent: 50 mins  Eric Nunnery, MD How to contact the [facility] Attending or Consulting provider 7A - 7P or covering provider during after hours 7P -7A, for this patient?  Check the care team in [facility] and look for a) attending/consulting [facility] provider listed and b) the [facility] team listed Log into [email or web address] to find provider on call.  Locate the [facility] provider you are looking for under [facility] and page to a number that you can be directly reached. If you still have difficulty reaching the provider, please page the [facility] (Director on Call) for the Hospitalists listed on amion for assistance.  04/16/2024, 5:37 PM

## 2024-04-16 NOTE — Plan of Care (Signed)
  Problem: Education: Goal: Knowledge of General Education information will improve Description: Including pain rating scale, medication(s)/side effects and non-pharmacologic comfort measures Outcome: Progressing   Problem: Coping: Goal: Level of anxiety will decrease Outcome: Progressing   Problem: Elimination: Goal: Will not experience complications related to bowel motility Outcome: Progressing Goal: Will not experience complications related to urinary retention Outcome: Progressing   Problem: Pain Managment: Goal: General experience of comfort will improve and/or be controlled Outcome: Progressing   Problem: Safety: Goal: Ability to remain free from injury will improve Outcome: Progressing

## 2024-04-16 NOTE — Progress Notes (Signed)
 Initial Nutrition Assessment  DOCUMENTATION CODES:   Non-severe (moderate) malnutrition in context of acute illness/injury  INTERVENTION:   Boost Breeze po BID, each supplement provides 250 kcal and 9 grams of protein Magic cup TID with meals, each supplement provides 290 kcal and 9 grams of protein MVI with minerals daily  NUTRITION DIAGNOSIS:   Moderate Malnutrition related to acute illness (abdominal pain, suspected lymphoma) as evidenced by energy intake < 75% for > 7 days, mild fat depletion, mild muscle depletion.  GOAL:   Patient will meet greater than or equal to 90% of their needs  MONITOR:   PO intake, Supplement acceptance  REASON FOR ASSESSMENT:   Rounds, Malnutrition Screening Tool    ASSESSMENT:   89 yo male admitted with AKI. PMH includes CKD, HTN, HLD, DM, skin cancer, recent hospitalization for abdominal distention and concern for colitis vs lymphoma.  CT scan showed no evidence of SBO. 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  Patient with increasing ascites and ongoing abdominal distention; IR to consult for paracentesis if possible.  Patient and granddaughter report that patient has been eating very poorly for several weeks now. He has abdominal pain and distention that seems to be not getting better. He has no appetite. For breakfast today, he forced himself to eat 1/2 of yogurt, 1/2 of milk, most of juice, and a few sips of water . This is more than he has been able to eat for several days. He does not like Ensure supplements. Will try Boost Breeze and magic cups.   Labs reviewed.  K 5.2 Phos 5.3 CBG: 86-101-95-125  Medications reviewed and include novolog , mylicon. Plans to start Lokelma today per Nephrology note.  IVF: sodium bicarb in sterile water  at 75 ml/h  Weight history reviewed.  12/29/22: 93.9 kg 03/24/24: 95.3  kg 04/05/24: 92.4 kg 04/10/24: 93.4 kg Suspect edema is masking actual weight loss and further depletion of muscle mass.   Patient meets criteria for moderate malnutrition, given mild depletion of muscle and subcutaneous fat mass with reported intake meeting < 75% of estimated energy requirements for > 7 days.  NUTRITION - FOCUSED PHYSICAL EXAM:  Flowsheet Row Most Recent Value  Orbital Region Mild depletion  Upper Arm Region Mild depletion  Thoracic and Lumbar Region Mild depletion  Buccal Region Mild depletion  Temple Region Mild depletion  Clavicle Bone Region Mild depletion  Clavicle and Acromion Bone Region Mild depletion  Scapular Bone Region Mild depletion  Dorsal Hand No depletion  Patellar Region Mild depletion  Anterior Thigh Region Mild depletion  Posterior Calf Region Mild depletion  Edema (RD Assessment) Moderate  Hair Reviewed  Eyes Reviewed  Mouth Reviewed  Skin Reviewed  Nails Reviewed    Diet Order:   Diet Order             DIET SOFT Room service appropriate? Yes; Fluid consistency: Thin  Diet effective now                   EDUCATION NEEDS:   No education needs have been identified at this time  Skin:  Skin Assessment: Reviewed RN Assessment  Last BM:  12/1  Height:   Ht Readings from Last 1 Encounters:  04/10/24 5' 9 (1.753 m)    Weight:   Wt Readings from Last 1 Encounters:  04/10/24 93.4 kg    Ideal Body Weight:  72.7 kg  BMI:  Body mass index is 30.42 kg/m.  Estimated Nutritional Needs:   Kcal:  2000-2200  Protein:  100-110 gm  Fluid:  2-2.2 L   Suzen HUNT RD, LDN, CNSC Contact via secure chat. If unavailable, use group chat RD Inpatient.

## 2024-04-16 NOTE — Plan of Care (Signed)
 Alert and oriented, OOB to chair with assistance.  Abdominal distention noted. Improved work of breathing with sitting in chair.  Family at bedside.   Problem: Education: Goal: Knowledge of General Education information will improve Description: Including pain rating scale, medication(s)/side effects and non-pharmacologic comfort measures Outcome: Progressing   Problem: Health Behavior/Discharge Planning: Goal: Ability to manage health-related needs will improve Outcome: Progressing   Problem: Clinical Measurements: Goal: Ability to maintain clinical measurements within normal limits will improve Outcome: Progressing Goal: Will remain free from infection Outcome: Progressing Goal: Diagnostic test results will improve Outcome: Progressing

## 2024-04-17 ENCOUNTER — Inpatient Hospital Stay (HOSPITAL_COMMUNITY): Admit: 2024-04-17 | Discharge: 2024-04-17 | Disposition: A | Attending: Internal Medicine | Admitting: Internal Medicine

## 2024-04-17 ENCOUNTER — Encounter (HOSPITAL_COMMUNITY): Payer: Self-pay | Admitting: Family Medicine

## 2024-04-17 ENCOUNTER — Inpatient Hospital Stay (HOSPITAL_COMMUNITY): Attending: Nephrology

## 2024-04-17 DIAGNOSIS — Z66 Do not resuscitate: Secondary | ICD-10-CM | POA: Diagnosis not present

## 2024-04-17 DIAGNOSIS — R627 Adult failure to thrive: Secondary | ICD-10-CM | POA: Diagnosis not present

## 2024-04-17 DIAGNOSIS — R531 Weakness: Secondary | ICD-10-CM | POA: Diagnosis not present

## 2024-04-17 DIAGNOSIS — N179 Acute kidney failure, unspecified: Secondary | ICD-10-CM | POA: Diagnosis not present

## 2024-04-17 LAB — COMPREHENSIVE METABOLIC PANEL WITH GFR
ALT: 28 U/L (ref 0–44)
AST: 46 U/L — ABNORMAL HIGH (ref 15–41)
Albumin: 3.2 g/dL — ABNORMAL LOW (ref 3.5–5.0)
Alkaline Phosphatase: 78 U/L (ref 38–126)
Anion gap: 18 — ABNORMAL HIGH (ref 5–15)
BUN: 73 mg/dL — ABNORMAL HIGH (ref 8–23)
CO2: 18 mmol/L — ABNORMAL LOW (ref 22–32)
Calcium: 8.2 mg/dL — ABNORMAL LOW (ref 8.9–10.3)
Chloride: 100 mmol/L (ref 98–111)
Creatinine, Ser: 2.89 mg/dL — ABNORMAL HIGH (ref 0.61–1.24)
GFR, Estimated: 20 mL/min — ABNORMAL LOW (ref >60)
Glucose, Bld: 105 mg/dL — ABNORMAL HIGH (ref 70–99)
Potassium: 4.8 mmol/L (ref 3.5–5.1)
Sodium: 136 mmol/L (ref 135–145)
Total Bilirubin: 0.5 mg/dL (ref 0.0–1.2)
Total Protein: 5.3 g/dL — ABNORMAL LOW (ref 6.5–8.1)

## 2024-04-17 LAB — CBC
HCT: 25.6 % — ABNORMAL LOW (ref 39.0–52.0)
Hemoglobin: 8.2 g/dL — ABNORMAL LOW (ref 13.0–17.0)
MCH: 27.4 pg (ref 26.0–34.0)
MCHC: 32 g/dL (ref 30.0–36.0)
MCV: 85.6 fL (ref 80.0–100.0)
Platelets: 144 K/uL — ABNORMAL LOW (ref 150–400)
RBC: 2.99 MIL/uL — ABNORMAL LOW (ref 4.22–5.81)
RDW: 18.2 % — ABNORMAL HIGH (ref 11.5–15.5)
WBC: 7 K/uL (ref 4.0–10.5)
nRBC: 0 % (ref 0.0–0.2)

## 2024-04-17 LAB — BODY FLUID CELL COUNT WITH DIFFERENTIAL
Lymphs, Fluid: 21 %
Monocyte-Macrophage-Serous Fluid: 74 % (ref 50–90)
Neutrophil Count, Fluid: 5 % (ref 0–25)
Total Nucleated Cell Count, Fluid: 57323 uL — ABNORMAL HIGH (ref 0–1000)

## 2024-04-17 LAB — GLUCOSE, CAPILLARY
Glucose-Capillary: 120 mg/dL — ABNORMAL HIGH (ref 70–99)
Glucose-Capillary: 115 mg/dL — ABNORMAL HIGH (ref 70–99)
Glucose-Capillary: 95 mg/dL (ref 70–99)
Glucose-Capillary: 113 mg/dL — ABNORMAL HIGH (ref 70–99)

## 2024-04-17 LAB — GRAM STAIN

## 2024-04-17 LAB — RENAL FUNCTION PANEL
Albumin: 3.3 g/dL — ABNORMAL LOW (ref 3.5–5.0)
Anion gap: 15 (ref 5–15)
BUN: 70 mg/dL — ABNORMAL HIGH (ref 8–23)
CO2: 21 mmol/L — ABNORMAL LOW (ref 22–32)
Calcium: 8.3 mg/dL — ABNORMAL LOW (ref 8.9–10.3)
Chloride: 100 mmol/L (ref 98–111)
Creatinine, Ser: 3.01 mg/dL — ABNORMAL HIGH (ref 0.61–1.24)
GFR, Estimated: 19 mL/min — ABNORMAL LOW (ref >60)
Glucose, Bld: 121 mg/dL — ABNORMAL HIGH (ref 70–99)
Phosphorus: 6.4 mg/dL — ABNORMAL HIGH (ref 2.5–4.6)
Potassium: 5.3 mmol/L — ABNORMAL HIGH (ref 3.5–5.1)
Sodium: 136 mmol/L (ref 135–145)

## 2024-04-17 LAB — HEPATIC FUNCTION PANEL
ALT: 33 U/L (ref 0–44)
AST: 53 U/L — ABNORMAL HIGH (ref 15–41)
Albumin: 3.5 g/dL (ref 3.5–5.0)
Alkaline Phosphatase: 77 U/L (ref 38–126)
Bilirubin, Direct: 0.3 mg/dL — ABNORMAL HIGH (ref 0.0–0.2)
Indirect Bilirubin: 0.2 mg/dL — ABNORMAL LOW (ref 0.3–0.9)
Total Bilirubin: 0.5 mg/dL (ref 0.0–1.2)
Total Protein: 5.5 g/dL — ABNORMAL LOW (ref 6.5–8.1)

## 2024-04-17 LAB — LACTATE DEHYDROGENASE
LDH: 1263 U/L — ABNORMAL HIGH (ref 105–235)
LDH: 1140 U/L — ABNORMAL HIGH (ref 105–235)

## 2024-04-17 LAB — URIC ACID: Uric Acid, Serum: 20.4 mg/dL — ABNORMAL HIGH (ref 3.7–8.6)

## 2024-04-17 MED ORDER — LIDOCAINE HCL (PF) 1 % IJ SOLN
10.0000 mL | Freq: Once | INTRAMUSCULAR | Status: AC
  Administered 2024-04-17: 10 mL

## 2024-04-17 MED ORDER — FENTANYL CITRATE (PF) 100 MCG/2ML IJ SOLN
INTRAMUSCULAR | Status: AC | PRN
  Administered 2024-04-17 (×3): 25 ug via INTRAVENOUS

## 2024-04-17 MED ORDER — ALBUMIN HUMAN 25 % IV SOLN
25.0000 g | Freq: Once | INTRAVENOUS | Status: AC
  Administered 2024-04-17: 25 g via INTRAVENOUS
  Filled 2024-04-17: qty 100

## 2024-04-17 MED ORDER — SODIUM CHLORIDE 0.9 % IV SOLN
6.0000 mg | Freq: Once | INTRAVENOUS | Status: DC
  Filled 2024-04-17: qty 4

## 2024-04-17 MED ORDER — MIDAZOLAM HCL 2 MG/2ML IJ SOLN
INTRAMUSCULAR | Status: AC
  Filled 2024-04-17: qty 2

## 2024-04-17 MED ORDER — PREDNISONE 20 MG PO TABS
60.0000 mg | ORAL_TABLET | Freq: Every day | ORAL | Status: AC
  Administered 2024-04-18 – 2024-04-22 (×5): 60 mg via ORAL
  Filled 2024-04-17 (×5): qty 3

## 2024-04-17 MED ORDER — SODIUM ZIRCONIUM CYCLOSILICATE 10 G PO PACK
10.0000 g | PACK | Freq: Two times a day (BID) | ORAL | Status: DC
  Administered 2024-04-17 – 2024-04-21 (×8): 10 g via ORAL
  Filled 2024-04-17 (×8): qty 1

## 2024-04-17 MED ORDER — SODIUM CHLORIDE 0.9 % IV SOLN: 6.0000 mg | Freq: Once | INTRAVENOUS | Status: DC

## 2024-04-17 MED ORDER — MIDAZOLAM HCL (PF) 2 MG/2ML IJ SOLN
INTRAMUSCULAR | Status: AC | PRN
  Administered 2024-04-17 (×2): .5 mg via INTRAVENOUS
  Administered 2024-04-17: 1 mg via INTRAVENOUS

## 2024-04-17 MED ORDER — FENTANYL CITRATE (PF) 100 MCG/2ML IJ SOLN
INTRAMUSCULAR | Status: AC
  Filled 2024-04-17: qty 2

## 2024-04-17 NOTE — Consult Note (Signed)
 Chief Complaint: Patient was seen in consultation today for lymphadenopathy  Referring Physician(s): Davonna Siad, MD (oncology)  Supervising Physician: Jenna Hacker  Patient Status: [facility] inpatient; transfer to [facility] for procedure  History of Present Illness: Cody Palmer is a 89 y.o. male with a past medical history significant for HTN, HLD, DM, CKD IIIa who presented to the ED on 04/10/24 with complaints of weakness, abdominal distention, urinary retention, nausea and poor oral intake. Mr. Cody Palmer had recently been hospitalized from 11/20-11/22  with similar complaints and he underwent CT abd/pelvis w/contrast on 11/20 which showed:  1. Interval increased ill-defined soft tissue density throughout the base of the mesentery with diffuse stranding of the mesenteric fat and new ascites. Findings are nonspecific, and potentially still inflammatory, but suspicious for lymphoma given the associated enlarged pericardiac, retroperitoneal and inguinal lymph nodes. 2. Persistent wall thickening of the ascending and transverse colon with increased wall thickening of the terminal ileum. These findings could be secondary to infection, inflammatory bowel disease or lymphedema. No evidence of bowel obstruction or perforation. 3. No evidence of urinary tract calculus or hydronephrosis. 4.  Aortic Atherosclerosis (ICD10-I70.0).   He underwent colonoscopy on 11/21 which showed diverticulosis and several small polyps. IR was consulted for possible lymph node biopsy however it was recommended that patient undergo PET scan prior to biopsy which was arranged for 12/4. Due to progressive worsening of his condition he underwent a second CT abd/pelvis w/o contrast on 12/1 which showed:  1. Grossly similar ill-defined soft tissue throughout the base of the mesentery. 2. Increased small to moderate abdominopelvic ascites since 11 / 12 / 25 . 3. Wall thickening about the terminal ileum and ascending  and transverse colon, grossly similar to prior given differences in contrast administration. 4. Unchanged left periaortic, pericardiac, and left inguinal lymphadenopathy.  IR has been consulted for paracentesis as well as possible lymph node biopsy to further direct care. Patient history and imaging reviewed by Dr. Jenna who approves left inguinal lymph node biopsy.  Patient seen US  prior to paracentesis, he reports feeling a little short of breath and has no appetite which he attributes to his abdominal distention. Discussed paracentesis as well left inguinal lymph node biopsy today with patient, he is agreeable to proceed and would prefer to have procedure with sedation. He has not had anything to eat or drink since last night.  Past Medical History:  Diagnosis Date   Arthritis    Cancer (HCC)    Skin cancer   Diabetes mellitus without complication (HCC)    HTN (hypertension)    Hypercholesteremia     Past Surgical History:  Procedure Laterality Date   CATARACT EXTRACTION W/PHACO Left 04/25/2016   Procedure: CATARACT EXTRACTION PHACO AND INTRAOCULAR LENS PLACEMENT LEFT EYE;  Surgeon: Cherene Mania, MD;  Location: [facility];  Service: Ophthalmology;  Laterality: Left;  CDE: 10.37   COLONOSCOPY N/A 04/05/2024   Procedure: COLONOSCOPY;  Surgeon: Cindie Carlin POUR, DO;  Location: [facility];  Service: Endoscopy;  Laterality: N/A;   CYSTOSCOPY WITH BIOPSY N/A 08/13/2013   Procedure: CYSTOSCOPY WITH BLADDER BIOPSY;  Surgeon: Garnette Shack, MD;  Location: [facility];  Service: Urology;  Laterality: N/A;   ENUCLEATION Right    Removed in 1958   EYE SURGERY  1958   removal of eye   TOTAL KNEE ARTHROPLASTY Right 01/11/2013   Procedure: RIGHT TOTAL KNEE ARTHROPLASTY;  Surgeon: Taft FORBES Minerva, MD;  Location: [facility];  Service: Orthopedics;  Laterality: Right;  TOTAL KNEE ARTHROPLASTY Left 07/12/2022   Procedure: TOTAL KNEE ARTHROPLASTY;  Surgeon: Margrette Taft BRAVO, MD;  Location: [facility];   Service: Orthopedics;  Laterality: Left;    Allergies: Patient has no known allergies.  Medications: Prior to Admission medications   Medication Sig Start Date End Date Taking? Authorizing Provider  allopurinol  (ZYLOPRIM ) 300 MG tablet Take 1 tablet (300 mg total) by mouth daily. 04/15/24   Geofm Delon BRAVO, NP  amLODipine  (NORVASC ) 10 MG tablet Take 10 mg by mouth daily.   Yes [provider]  aspirin  EC 81 MG tablet Take 81 mg by mouth daily. Swallow whole.   Yes [provider]  Cholecalciferol  (VITAMIN D3) 25 MCG (1000 UT) CAPS Take 1,000 Units by mouth daily.   Yes [provider]  JARDIANCE  25 MG TABS tablet Take 12.5 mg by mouth daily. 12/09/20  Yes [provider]  labetalol  (NORMODYNE ) 300 MG tablet Take 300 mg by mouth 2 (two) times daily. 11/20/20  Yes [provider]  losartan -hydrochlorothiazide  (HYZAAR) 100-25 MG per tablet Take 1 tablet by mouth daily.   Yes [provider]  metFORMIN  (GLUCOPHAGE ) 500 MG tablet Take 500 mg by mouth 2 (two) times daily with a meal.   Yes [provider]  Multiple Vitamins-Minerals (EYE HEALTH AREDS 2 PO) Take 1 capsule by mouth 2 (two) times daily.   Yes [provider]  ondansetron  (ZOFRAN ) 4 MG tablet Take 1 tablet (4 mg total) by mouth daily as needed for nausea or vomiting. 04/06/24 04/06/25 Yes Shah, Pratik D, DO  pantoprazole  (PROTONIX ) 40 MG tablet Take 1 tablet (40 mg total) by mouth daily. 30 minutes before breakfast 03/27/24  Yes Shirlean Therisa ORN, NP  polyethylene glycol (MIRALAX ) 17 g packet Take 17 g by mouth daily as needed for mild constipation or moderate constipation. 04/06/24  Yes Shah, Pratik D, DO  rosuvastatin  (CRESTOR ) 10 MG tablet Take 10 mg by mouth every other day.   Yes [provider]  simethicone  (MYLICON) 80 MG chewable tablet Chew 1 tablet (80 mg total) by mouth every 6 (six) hours as needed for flatulence. 04/06/24  Yes Maree, Pratik D, DO      Family History  Problem Relation Age of Onset   Arthritis Unknown    Cancer Unknown    Diabetes Unknown     Social History   Socioeconomic History   Marital status: Widowed    Spouse name: Not on file   Number of children: Not on file   Years of education: Not on file   Highest education level: Not on file  Occupational History   Not on file  Tobacco Use   Smoking status: Former    Current packs/day: 0.00    Average packs/day: 1 pack/day for 8.0 years (8.0 ttl pk-yrs)    Types: Cigarettes    Start date: 01/07/1957    Quit date: 01/07/1965    Years since quitting: 59.3   Smokeless tobacco: Never  Vaping Use   Vaping status: Never Used  Substance and Sexual Activity   Alcohol use: No   Drug use: No   Sexual activity: Yes    Birth control/protection: None  Other Topics Concern   Not on file  Social History Narrative   Not on file   Social Drivers of Health   Financial Resource Strain: Not on file  Food Insecurity: No Food Insecurity (04/10/2024)   Hunger Vital Sign    Worried About Running Out of Food in the  Last Year: Never true    Ran Out of Food in the Last Year: Never true  Transportation Needs: No Transportation Needs (04/10/2024)   PRAPARE - Administrator, Civil Service (Medical): No    Lack of Transportation (Non-Medical): No  Physical Activity: Not on file  Stress: Not on file  Social Connections: Moderately Isolated (04/10/2024)   Social Connection and Isolation Panel    Frequency of Communication with Friends and Family: More than three times a week    Frequency of Social Gatherings with Friends and Family: More than three times a week    Attends Religious Services: 1 to 4 times per year    Active Member of [employer] or Organizations: No    Attends Banker Meetings: Never    Marital Status: Widowed     Review of Systems: A 12 point ROS discussed and pertinent positives are indicated in the HPI above.  All other systems are  negative.  Review of Systems  Constitutional:  Positive for activity change and fatigue. Negative for chills and fever.  Respiratory:  Positive for shortness of breath. Negative for cough.   Cardiovascular:  Negative for chest pain.  Gastrointestinal:  Positive for abdominal distention and nausea. Negative for abdominal pain, blood in stool and vomiting.  Neurological:  Negative for dizziness and headaches.    Vital Signs: BP (!) 104/57   Pulse 91   Temp (!) 97.5 F (36.4 C) (Oral)   Resp 20   Ht 5' 9 (1.753 m)   Wt 206 lb (93.4 kg)   SpO2 96%   BMI 30.42 kg/m   Physical Exam Vitals and nursing note reviewed.  Constitutional:      General: He is not in acute distress.    Appearance: He is ill-appearing.  HENT:     Head: Normocephalic.     Mouth/Throat:     Mouth: Mucous membranes are dry.     Pharynx: Oropharynx is clear. No oropharyngeal exudate or posterior oropharyngeal erythema.  Cardiovascular:     Rate and Rhythm: Normal rate and regular rhythm.  Pulmonary:     Effort: Pulmonary effort is normal.     Breath sounds: Normal breath sounds.  Abdominal:     General: There is distension.     Tenderness: There is no abdominal tenderness.  Skin:    General: Skin is warm and dry.  Neurological:     Mental Status: He is alert and oriented to person, place, and time.  Psychiatric:        Mood and Affect: Mood normal.        Behavior: Behavior normal.        Thought Content: Thought content normal.        Judgment: Judgment normal.      MD Evaluation Airway: WNL Heart: WNL Abdomen: WNL Chest/ Lungs: WNL ASA  Classification: 3 Mallampati/Airway Score: Two   Imaging: CT ABDOMEN PELVIS WO CONTRAST Result Date: 04/16/2024 EXAM: CT ABDOMEN AND PELVIS WITHOUT CONTRAST 04/15/2024 10:17:55 PM TECHNIQUE: CT of the abdomen and pelvis was performed without the administration of intravenous contrast. Multiplanar reformatted images are provided for review. Automated  exposure control, iterative reconstruction, and/or weight-based adjustment of the mA/kV was utilized to reduce the radiation dose to as low as reasonably achievable. COMPARISON: Comparison with the 04/04/2024 CLINICAL HISTORY: Abdominal pain, acute, nonlocalized. FINDINGS: LOWER CHEST: Trace bilateral pleural effusions. LIVER: The liver is unremarkable. GALLBLADDER AND BILE DUCTS: Similar reactive gallbladder wall thickening. No biliary ductal  dilatation. SPLEEN: No acute abnormality. PANCREAS: Unchanged ill-defined soft tissue about the pancreatic head. This was better assessed on prior CT with contrast. ADRENAL GLANDS: No acute abnormality. KIDNEYS, URETERS AND BLADDER: No stones in the kidneys or ureters. No hydronephrosis. No perinephric or periureteral stranding. Foley catheter in the decompressed bladder. GI AND BOWEL: Oral contrast is present within the stomach and small bowel. There is dilute contrast in the colon. Wall thickening about the terminal ileum and ascending and transverse colon is grossly similar to prior given differences in contrast administration. There is no bowel obstruction. PERITONEUM AND RETROPERITONEUM: Increased small to moderate abdominal pelvic ascites. Diffuse mesenteric stranding is similar. Redemonstrated ill-defined soft tissue density throughout the base of the mesentery. This was better evaluated with CT with IV contrast on 04/04/2024 but is grossly similar. No free air. VASCULATURE: Aorta is normal in caliber. Aortic atherosclerotic calcification. LYMPH NODES: Unchanged left periaortic lymphadenopathy measuring up to 1.7 cm on series 2 image 45. Similar enlargement of a left inguinal lymph node measuring 1.3 cm. Similar enlargement of a pericardial lymph node measuring 1.8 cm on series 2 image 14. REPRODUCTIVE ORGANS: No acute abnormality. BONES AND SOFT TISSUES: No acute osseous abnormality. No focal soft tissue abnormality. IMPRESSION: 1. Grossly similar ill-defined soft  tissue throughout the base of the mesentery. 2. Increased small to moderate abdominopelvic ascites since 11 / 12 / 25 . 3. Wall thickening about the terminal ileum and ascending and transverse colon, grossly similar to prior given differences in contrast administration. 4. Unchanged left periaortic, pericardiac, and left inguinal lymphadenopathy. Electronically signed by: Norman Gatlin MD 04/16/2024 02:38 AM EST RP Workstation: [id number]   US  RENAL Result Date: 04/14/2024 CLINICAL DATA:  Acute kidney injury EXAM: RENAL / URINARY TRACT ULTRASOUND COMPLETE COMPARISON:  CT abdomen pelvis dated 04/04/2024 FINDINGS: Right Kidney: Length = 11.5 cm Normal parenchymal echogenicity with preserved corticomedullary differentiation. No urinary tract dilation or shadowing calculi. The ureter is not seen. Left Kidney: Length = 11.1 cm Normal parenchymal echogenicity with preserved corticomedullary differentiation. No urinary tract dilation or shadowing calculi. The ureter is not seen. Bladder: Appears normal for degree of bladder distention. Other: Partially imaged small volume ascites. IMPRESSION: 1. No urinary tract dilation or shadowing calculi. Normal cortical echogenicity. 2. Partially imaged small volume ascites. Electronically Signed   By: Limin  Xu M.D.   On: 04/14/2024 15:42   DG Abd 2 Views Result Date: 04/12/2024 CLINICAL DATA:  Abdominal distention EXAM: ABDOMEN - 2 VIEW COMPARISON:  Abdominal radiograph dated 04/04/2024 FINDINGS: Nonobstructive bowel gas pattern. Relative paucity of bowel gas within the right lower quadrant and central abdomen. No free air or pneumatosis. No abnormal radio-opaque calculi or mass effect. No acute or substantial osseous abnormality. The sacrum and coccyx are partially obscured by overlying bowel contents. IMPRESSION: Nonobstructive bowel gas pattern. Relative paucity of bowel gas within the right lower quadrant and central abdomen may reflect underdistended/compressed or  fluid-filled bowel loops. Electronically Signed   By: Limin  Xu M.D.   On: 04/12/2024 16:21   US  ASCITES (ABDOMEN LIMITED) Result Date: 04/11/2024 CLINICAL DATA:  Abdominal distension.  Assess for ascites. EXAM: LIMITED ABDOMEN ULTRASOUND FOR ASCITES TECHNIQUE: Limited ultrasound survey for ascites was performed in all four abdominal quadrants. COMPARISON:  CT, 04/04/2024. FINDINGS: Small amount of ascites, most evident in the lower quadrants. This overall appears increased compared to the prior CT. IMPRESSION: 1. Small amount of ascites. Electronically Signed   By: Alm Parkins M.D.   On: 04/11/2024 12:38  CT CHEST W CONTRAST Result Date: 04/05/2024 EXAM: CT CHEST WITH CONTRAST 04/05/2024 03:37:22 PM TECHNIQUE: CT of the chest was performed with the administration of 75 mL of iohexol  (OMNIPAQUE ) 300 MG/ML solution. Multiplanar reformatted images are provided for review. Automated exposure control, iterative reconstruction, and/or weight based adjustment of the mA/kV was utilized to reduce the radiation dose to as low as reasonably achievable. COMPARISON: CT 04/04/2024 and CT abdomen and pelvis 1 day prior. CLINICAL HISTORY: Lymphadenopathy on recent CT abdomen - needs CT chest to assess for further lymphadenopathy/possible biopsy in IR. * Tracking Code: BO * FINDINGS: MEDIASTINUM: Heart is unremarkable. No pericardial fluid. The central airways are clear. LYMPH NODES: Bilateral small axillary lymph nodes. No Supraclavicular lymph nodes. No enlargement of the mediastinal lymph nodes. No hilar lymphadenopathy. LUNGS AND PLEURA: No focal consolidation or pulmonary edema. No pleural effusion or pneumothorax. No suspicious pulmonary nodules. Mild thickening along the pleural surface of the fissures. SOFT TISSUES/BONES: No aggressive osseous lesion. No acute abnormality of the soft tissues. UPPER ABDOMEN: Limited view of the upper abdomen demonstrates infiltrative process in the upper abdominal mesentery as  seen on recent CT of the abdomen and pelvis. Findings suggestive of carcinomatosis. A small enhancing lesion in the right hepatic lobe on image 119 is also described on comparison CT. IMPRESSION: 1. No evidence of thoracic metastasis. 2. Infiltrative process in the upper abdominal mesentery suggestive of peritoneal carcinomatosis. 3. See CT abdomen and pelvis 1 day prior. Electronically signed by: Norleen Boxer MD 04/05/2024 03:54 PM EST RP Workstation: [id number]   CT ABDOMEN PELVIS W CONTRAST Result Date: 04/04/2024 CLINICAL DATA:  Bowel obstruction suspected recent enteritis, persistent distention. Abdominal distension with urinary retention and weakness. Recent antibiotic therapy for abdominal infection (suspected colitis). EXAM: CT ABDOMEN AND PELVIS WITH CONTRAST TECHNIQUE: Multidetector CT imaging of the abdomen and pelvis was performed using the standard protocol following bolus administration of intravenous contrast. RADIATION DOSE REDUCTION: This exam was performed according to the departmental dose-optimization program which includes automated exposure control, adjustment of the mA and/or kV according to patient size and/or use of iterative reconstruction technique. CONTRAST:  80mL OMNIPAQUE  IOHEXOL  300 MG/ML  SOLN COMPARISON:  Prior CTs 03/24/2024 and 07/19/2013. FINDINGS: Lower chest: Clear lung bases. No significant pleural or pericardial effusion. There is atherosclerosis of the aorta and coronary arteries. Prominent right pericardiac node measures 1.7 cm on image 13/2. Hepatobiliary: The liver has a non cirrhotic morphology without suspicious focal abnormality. There is a probable small cyst posteriorly in the right lobe measuring 1.8 cm on image 23/2. This is unchanged from the recent prior study and decreased in size from the 2015 study. Oval focus of enhancement in the dome of the right hepatic lobe measuring 2.1 cm on image 19/2 is unchanged from the recent study, probably an incidental venous  anomaly. Gallbladder wall thickening, as before, likely reactive. No evidence of calcified gallstones. No significant biliary dilatation. Pancreas: Fatty atrophy of the pancreatic body and tail. There is ill-defined soft tissue around the pancreatic head. Spleen: Normal in size without focal abnormality. Adrenals/Urinary Tract: Both adrenal glands appear normal. No evidence of urinary tract calculus, suspicious renal lesion or hydronephrosis. Early delayed post-contrast images demonstrate no significant contrast excretion from either kidney. There is mild symmetric perinephric soft tissue stranding bilaterally. Probable chronic bladder diverticula. No acute abnormality of the urinary bladder demonstrated. Stomach/Bowel: No enteric contrast administered. The stomach appears unremarkable for its degree of distention. There is wall thickening of the transverse duodenum without significant  small bowel distension. Interval increased wall thickening of the terminal ileum. Persistent wall thickening of the ascending and transverse colon. Diffuse diverticulosis of the descending and sigmoid colon without acute inflammation. Vascular/Lymphatic: Prominent left periaortic node measuring 2.1 cm on image 41/2. There is ill-defined soft tissue density throughout the base of the mesentery which has increased compared with the previous prior study, including a component measuring approximately 10.9 x 6.0 cm on image 53/2. This encases the portal and superior mesenteric veins which remain patent. Mildly enlarged inguinal lymph nodes, measuring up to 1.5 cm short axis on image 91/2. Aortic and branch vessel atherosclerosis without evidence of aneurysm or large vessel occlusion. Reproductive: The prostate gland and seminal vesicles appear normal. Other: New perihepatic and perisplenic ascites with fluid in both pericolic gutters. As above, increasing ill-defined soft tissue density throughout the base of the mesentery with diffuse  stranding of the mesenteric fat. No focal fluid collection or pneumoperitoneum demonstrated. Small umbilical hernia. Musculoskeletal: No acute or significant osseous findings. Multilevel spondylosis. IMPRESSION: 1. Interval increased ill-defined soft tissue density throughout the base of the mesentery with diffuse stranding of the mesenteric fat and new ascites. Findings are nonspecific, and potentially still inflammatory, but suspicious for lymphoma given the associated enlarged pericardiac, retroperitoneal and inguinal lymph nodes. 2. Persistent wall thickening of the ascending and transverse colon with increased wall thickening of the terminal ileum. These findings could be secondary to infection, inflammatory bowel disease or lymphedema. No evidence of bowel obstruction or perforation. 3. No evidence of urinary tract calculus or hydronephrosis. 4.  Aortic Atherosclerosis (ICD10-I70.0). Electronically Signed   By: Elsie Perone M.D.   On: 04/04/2024 12:26   DG ABD ACUTE 2+V W 1V CHEST Result Date: 04/04/2024 CLINICAL DATA:  Abdominal distension. EXAM: DG ABDOMEN ACUTE WITH 1 VIEW CHEST COMPARISON:  Radiographs 03/27/2024.  CT 03/24/2024. FINDINGS: Supine and erect views of the abdomen demonstrate mild motion artifact and chronic elevation of the right hemidiaphragm. There is a normal nonobstructive bowel gas pattern and no evidence of pneumoperitoneum. No suspicious abdominal calcifications are identified. Multilevel thoracolumbar spondylosis. IMPRESSION: No radiographic evidence of acute abdominal process. Electronically Signed   By: Elsie Perone M.D.   On: 04/04/2024 10:56   DG Abd 2 Views Result Date: 03/27/2024 CLINICAL DATA:  Diffuse abdominal pain.  Enterocolitis. EXAM: ABDOMEN - 2 VIEW COMPARISON:  CT abdomen pelvis 03/24/2024. FINDINGS: Gas and stool are scattered in nondilated colon. No small bowel dilatation. Degenerative changes in the spine. IMPRESSION: No acute findings. Electronically  Signed   By: Newell Eke M.D.   On: 03/27/2024 13:25   CT ABDOMEN PELVIS W CONTRAST Result Date: 03/24/2024 EXAM: CT ABDOMEN AND PELVIS WITH CONTRAST 03/24/2024 12:34:57 PM TECHNIQUE: CT of the abdomen and pelvis was performed with the administration of 100 mL of iohexol  (OMNIPAQUE ) 300 MG/ML solution. Multiplanar reformatted images are provided for review. Automated exposure control, iterative reconstruction, and/or weight-based adjustment of the mA/kV was utilized to reduce the radiation dose to as low as reasonably achievable. COMPARISON: Comparison with 07/19/2013. CLINICAL HISTORY: Constipation. Laxative not providing relief. FINDINGS: LOWER CHEST: No acute abnormality. LIVER: The liver is unremarkable. GALLBLADDER AND BILE DUCTS: Mild gallbladder wall thickening is favored reactive. No biliary ductal dilatation. SPLEEN: No acute abnormality. PANCREAS: Fatty atrophy of the pancreas. There is relative sparing of the fat about the pancreas from the adjacent inflammatory process. ADRENAL GLANDS: No acute abnormality. KIDNEYS, URETERS AND BLADDER: No stones in the kidneys or ureters. No hydronephrosis. No perinephric or  periureteral stranding. Large bladder diverticula about the right dome of the bladder. GI AND BOWEL: Wall thickening about the ascending and transverse colon with marked adjacent inflammatory stranding and fluid. Extensive diverticulosis about the descending and sigmoid colon. Mild stranding about the descending colon. Wall thickening about the gastric antrum, duodenum, and proximal jejunum with adjacent stranding and free fluid. Additional wall thickening and stranding with engorgement of the vasa recta about the ileum in the right lower quadrant. There is no bowel obstruction. PERITONEUM AND RETROPERITONEUM: No free air. No organized fluid collection. VASCULATURE: Aorta is normal in caliber. Aortic atherosclerotic calcification. LYMPH NODES: No lymphadenopathy. REPRODUCTIVE ORGANS: No acute  abnormality. BONES AND SOFT TISSUES: No acute osseous abnormality. No focal soft tissue abnormality. IMPRESSION: 1. Severe enterocolitis greatest about the duodenum, ileum, and right colon. No drainable abscess for free intraperitoneal air. Electronically signed by: Norman Gatlin MD 03/24/2024 01:18 PM EST RP Workstation: [id number]    Labs:  CBC: Recent Labs    04/11/24 0435 04/14/24 0525 04/15/24 0436 04/17/24 0500  WBC 4.4 4.9 4.0 7.0  HGB 8.9* 9.3* 8.6* 8.2*  HCT 28.0* 29.6* 27.0* 25.6*  PLT 138* 172 158 144*    COAGS: No results for input(s): INR, APTT in the last 8760 hours.  BMP: Recent Labs    04/14/24 0525 04/15/24 0436 04/16/24 0445 04/17/24 0504  NA 139 138 137 136  K 5.1 4.7 5.2* 5.3*  CL 105 106 104 100  CO2 21* 19* 18* 21*  GLUCOSE 96 96 91 121*  BUN 48* 51* 59* 70*  CALCIUM  8.5* 8.2* 8.2* 8.3*  CREATININE 1.91* 1.90* 2.35* 3.01*  GFRNONAA 33* 33* 26* 19*    LIVER FUNCTION TESTS: Recent Labs    04/06/24 0435 04/10/24 1046 04/16/24 0445 04/17/24 0500 04/17/24 0504  BILITOT 0.5 0.6 0.5 0.5  --   AST 47* 70* 61* 53*  --   ALT 43 50* 35 33  --   ALKPHOS 74 79 74 77  --   PROT 5.2* 5.6* 5.1* 5.5*  --   ALBUMIN 3.5 3.6 3.2*  3.2* 3.5 3.3*    TUMOR MARKERS: No results for input(s): AFPTM, CEA, CA199, CHROMGRNA in the last 8760 hours.  Assessment and Plan:  89 y/o M admitted for the second time in less than a month with complaints of weakness, poor oral intake, nausea and abdominal distention found to have several areas of lymphadenopathy concerning for malignancy. IR has been consulted for possible lymph node biopsy to further direct care. Patient history and imaging reviewed by IR attending Dr. Jenna who approves procedure for today at [facility].  Plan:  - Remain NPO until post procedure, patient would prefer sedation. - [facility] to bring patient to [facility] IR ASAP today and will return to [facility] post procedure for ongoing care. - No pre  procedure labs indicated. - Will proceed with paracentesis prior to departure to [facility].  Risks and benefits of lef t was discussed with the patient and/or patient's family including, but not limited to bleeding, infection, damage to adjacent structures or low yield requiring additional tests.  All of the questions were answered and there is agreement to proceed.  Consent signed and in chart.  Thank you for this interesting consult.  I greatly enjoyed meeting HERNANDEZ LOSASSO and look forward to participating in their care.  A copy of this report was sent to the requesting provider on this date.  Electronically Signed: Clotilda DELENA Hesselbach, PA-C 04/17/2024, 9:49 AM   I spent  a total of 40 Minutes in face to face in clinical consultation, greater than 50% of which was counseling/coordinating care for lymphadenopathy.

## 2024-04-17 NOTE — Progress Notes (Signed)
 [facility] Consultation Hematology/Oncology  CONSULTING PHYSICIAN: Dr. Ricky  REASON FOR CONSULT: Abdominal mass    HISTORY OF PRESENT ILLNESS:   Cody Palmer is a 89 y.o. male with past medical history of  hypertension, type 2 diabetes, hyperlipidemia, CKD and severe enteritis who presented to the emergency room with progressive weakness, abdominal distention, urinary retention, poor oral intake and nausea.  GI was consulted and patient had colonoscopy today which shows concerns for abdominal lymphoma.  He had a CT abdomen/pelvis which showed ill-defined soft tissue density throughout the base of the mesentery with diffuse stranding of mesenteric fat and new ascites.  Findings are nonspecific and potentially still inflammatory but suspicious for lymphoma given the associated enlarged pericardiac, retroperitoneal and inguinal lymph nodes.  CT chest showed no evidence of thoracic metastasis, infiltrative process in the upper abdominal mesentery suggestive of peritoneal carcinomatosis.  Patient was discharged on 04/06/2024 with follow-up with us .     Labs from 04/06/24 showed elevated uric acid levels 13.3. He was started on Allopurinol daily.    He presented back to the emergency room on 04/10/2024 for poor oral intake, no appetite, poor fluid consumption and failure to thrive.  He had also become progressively weak with nausea.  He also was having abdominal distention.   He was scheduled for PET scan and outpatient biopsy but those were not until next week.   Reports abdominal distention and he is scheduled for a ultrasound-guided paracentesis.  Most recent CT scan is from yesterday which showed grossly similar ill-defined soft tissue throughout the base of the mesentery, increased small to moderate abdominal pelvic ascites since 03/27/2024, wall thickening around the terminal ileum and ascending and transverse colon, gross similar to prior given differences in contrast administration.   He had 2.2 fluids removed today.   Surgical pathology from colonoscopy showed several tubular adenomas negative for high-grade dysplasia or malignancy.  Flow cytometry showed abnormal clonal B-cell population comprising of 37% of lymphocytes.  The cells are positive for CD19, CD20, CD22, CD11c and express lambda light chains.  Complete hematologic evaluation including sampling involved lymph nodes may be performed as clinically indicated to rule out lymphoproliferative disorder.  Patient was able to have biopsy of left groin lymph node done today.  Results should be available on Friday.  Patient continues to be very uncomfortable.  His abdomen is still distended although he had 2.2 L of fluid removed today.  Overall feels weak, some nausea and no appetite.     MEDICATIONS: I have reviewed the patient's current medications.       PERFORMANCE STATUS: The patient's performance status is 3 - Symptomatic, >50% confined to bed  PHYSICAL EXAM: Most Recent Vital Signs: Blood pressure (!) 134/103, pulse 92, temperature 97.8 F (36.6 C), temperature source Oral, resp. rate 12, height 6' (1.829 m), weight 270 lb (122.5 kg), SpO2 91%.  GENERAL:alert, no distress and comfortable SKIN: skin color, texture, turgor are normal, no rashes or significant lesions EYES: normal, conjunctiva are pink and non-injected, sclera clear OROPHARYNX:no exudate, no erythema and lips, buccal mucosa, and tongue normal  NECK: supple, thyroid normal size, non-tender, without nodularity LYMPH:  no palpable lymphadenopathy in the cervical, axillary or inguinal LUNGS: clear to auscultation and percussion with normal breathing effort HEART: regular rate & rhythm and no murmurs and no lower extremity edema ABDOMEN:abdomen soft, non-tender and normal bowel sounds Musculoskeletal:no cyanosis of digits and no clubbing  PSYCH: alert & oriented x 3 with fluent speech NEURO: no focal  motor/sensory deficits    LABORATORY DATA:    Last CBC Lab Results  Component Value Date   WBC 7.0 04/17/2024   HGB 8.2 (L) 04/17/2024   HCT 25.6 (L) 04/17/2024   MCV 85.6 04/17/2024   MCH 27.4 04/17/2024   RDW 18.2 (H) 04/17/2024   PLT 144 (L) 04/17/2024     Last metabolic panel Lab Results  Component Value Date   GLUCOSE 121 (H) 04/17/2024   NA 136 04/17/2024   K 5.3 (H) 04/17/2024   CL 100 04/17/2024   CO2 21 (L) 04/17/2024   BUN 70 (H) 04/17/2024   CREATININE 3.01 (H) 04/17/2024   GFRNONAA 19 (L) 04/17/2024   CALCIUM  8.3 (L) 04/17/2024   PHOS 6.4 (H) 04/17/2024   PROT 5.5 (L) 04/17/2024   ALBUMIN  3.3 (L) 04/17/2024   LABGLOB 2.4 03/27/2024   BILITOT 0.5 04/17/2024   ALKPHOS 77 04/17/2024   AST 53 (H) 04/17/2024   ALT 33 04/17/2024   ANIONGAP 15 04/17/2024      RADIOGRAPHY: US  CORE BIOPSY (LYMPH NODES) INDICATION: Left groin lymphadenopathy.  EXAM: ULTRASOUND GUIDED core needle  BIOPSY OF left inguinal region  MEDICATIONS: None.  ANESTHESIA/SEDATION: Fentanyl  1.5 mcg IV; Versed  50 mg IV  Moderate Sedation Time:  10  The patient was continuously monitored during the procedure by the interventional radiology nurse under my direct supervision.  PROCEDURE: The procedure, risks, benefits, and alternatives were explained to the patient. Questions regarding the procedure were encouraged and answered. The patient understands and consents to the procedure.  The left groin was prepped with chlorhexidine  in a sterile fashion, and a sterile drape was applied covering the operative field. A sterile gown and sterile gloves were used for the procedure. Local anesthesia was provided with 1% Lidocaine .  An introducer needle was advanced through a small incision made in the mid aspect of the left groin. The needle was advanced under ultrasound guidance until the needle tip was engaged within the abnormally enlarged lymph node. The stylet was removed and the BioPince needle was then advanced through the  introducer needle until the BioPince needle was engaged within the lymph node tissue. A 2.3 cm core sample was obtained. Sample was placed in saline. A second sample was obtained in similar fashion. All needles removed from the patient. Sterile dressing applied. Final image demonstrates no active hemorrhage.  COMPLICATIONS: Satisfactory core needle biopsy of left inguinal region lymph node.  Electronically Signed   By: Cordella Banner   On: 04/17/2024 15:24 US  Paracentesis INDICATION: Patient with enlarging mesenteric mass concerning for lymphoma, new onset ascites. Request for diagnostic and therapeutic paracentesis.  EXAM: ULTRASOUND GUIDED DIAGNOSTIC AND THERAPEUTIC PARACENTESIS  MEDICATIONS: 6 mL 1% lidocaine   COMPLICATIONS: None immediate.  PROCEDURE: Informed written consent was obtained from the patient after a discussion of the risks, benefits and alternatives to treatment. A timeout was performed prior to the initiation of the procedure.  Initial ultrasound scanning demonstrates a moderate amount of ascites within the right lower abdominal quadrant. The right lower abdomen was prepped and draped in the usual sterile fashion. 1% lidocaine  was used for local anesthesia.  Following this, a 19 gauge, 7-cm, Yueh catheter was introduced. An ultrasound image was saved for documentation purposes. The paracentesis was performed. The catheter was removed and a dressing was applied. The patient tolerated the procedure well without immediate post procedural complication.  FINDINGS: A total of approximately 2.2 L of bloody fluid was removed. Samples were sent to the laboratory as requested  by the clinical team.  IMPRESSION: Successful ultrasound-guided paracentesis yielding 2.2 liters of peritoneal fluid.  Performed by Clotilda Hesselbach, PA-C  Electronically Signed   By: CHRISTELLA.  Shick M.D.   On: 04/17/2024 11:48        ASSESSMENT: Mr. Cropper is an 89 year old  male who presents for persistent abdominal bloating and found to have abdominal mass concerning for lymphoma.  PLAN:  Abdominal mass: Had biopsy with IR today.  Results should be available on Friday. Had paracentesis with 2.2 L of fluid removed from his abdomen.  This was sent for cytology and flow cytometry. 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  Start prednisone 60 mg daily x 5 days now that he has had his biopsy. Reviewed labs from this morning which showed persistently elevated potassium levels 5.3, AKI creatinine 3.01, uric acid 20.4. Dr. Lanny is also aware. We will continue to follow.    Thank you for involving us  in this patient's care.  Please to reach out with any questions or concerns.  Delon Hope, AGNP-C Department of Hematology/Oncology [facility]  Phone: [phone]  04/17/2024 5:18 PM

## 2024-04-17 NOTE — Procedures (Signed)
 Pre procedural Dx: LAD  Post procedural Dx: Same  Technically successful US  guided biopsy of left groin lymph node   EBL: None.   Complications: None immediate.   KANDICE Banner, MD Pager #: [phone]

## 2024-04-17 NOTE — Progress Notes (Signed)
 PROGRESS NOTE   Cody Palmer  FMW:[id number] DOB: 04/11/1933 DOA: 04/10/2024 PCP: [name], NP   Chief Complaint  Patient presents with   Weakness   Level of care: Med-Surg  Brief Admission History:  Cody Palmer is a 89 year old male with stage IIIa CKD, hypertension, hyperlipidemia, type 2 diabetes mellitus, controlled, recently hospitalized for abdominal distention and concern for colitis versus lymphoma.  He was discharged 4 days ago to home.  He returns today due to poor oral intake, almost no appetite, poor fluid consumption and failure to thrive.  He has become increasingly more weak.  He was seen by GI and had colonoscopy on 04/05/2024 with findings of polyps but otherwise pretty normal.  He also had been seen by oncology during last hospital stay and arrangements made for outpatient PET scan on 04/18/2024 and then will proceed with a lymph node biopsy with IR.  Patient has been having nausea and taking Zofran , abdominal distention persist, poor oral intake persists.  He has been having loose diarrheal stools.  Today he is noted to have another AKI and started on IV fluid hydration.  Admission was requested for further management.    Assessment and Plan:  AKI on CKD stage 3a/hyperkalemia/lymphoma. -- due to poor oral intake and diarrhea -- initially started to improve but now trending back up --CT scan of the abdomen has been ordered as recommended by nephrology. -- agree with IV fluid hydration, continue today as he is not back to baseline renal function and poor oral intake -- Continue to follow BMP as creatinine trends down -- Continue avoiding nephrotoxins -Lokelma  given. (Potassium 5.3). - Creatinine has worsened up to 3.01; patient's GFR 19. -Continue to follow nephrology service recommendation. -After discussing with oncology service there is concerns for TLS; uric acid, LDH and CMP q12h will be check. Continue aggressive IVF's resuscitation. -core  biopsy requested to confirm suspicious diagnosis of lymphoma. -will follow rec's.   Essential hypertension  -- stable, resume home meds as able -- resume home labetalol  at reduced dose of 200 mg BID with hold parameters --follow VS and further resume antihypertensive agents as needed. -Continue to hold the rest of his antihypertensive agents.   Controlled type 2 DM  -- sensitive SSI coverage ordered  CBG (last 3)  Recent Labs    04/17/24 0730 04/17/24 1045 04/17/24 1617  GLUCAP 120* 115* 95   Generalized Weakness -- secondary to metabolic derangements  -- encourage ambulation and physical interaction  --might need significant assistance at discharge   Adult failure to thrive -- he remains weak, failed recent discharge home -- Appreciate assistance and recommendation by dietitian regarding feeding supplements. -- hopefully he improves with supportive measures --follow clinical response   Abdominal distension Question of abdominal lymphoma -- ongoing outpatient work up  -- he is to have a PET scan outpatient on 12/4 -- Oncology service has been consulted. -- he is to have IR lymph node biopsy after PET scan - acute abdomen series demonstrating not SBO - CT scan demonstrated incisional loop dilations without SBO; moderate ascites. -S/P paracentesis (2.2L removed) -IV albumin  has been ordered. - Core biopsy has been requested - Will follow further recommendations by oncology service.   DVT prophylaxis: heparin  Code Status: DNR  Family Communication: daughter bedside updated Disposition: anticipate home when stabilized    Consultants:  Nephrology service, oncology service, interventional radiology and gastroenterology.  Procedures:  See below for x-ray reports.  Antimicrobials:    Subjective: Afebrile, no chest  pain, no nausea, no vomiting.  Overall feeling better.   Objective: Vitals:   04/17/24 0916 04/17/24 0920 04/17/24 0930 04/17/24 0940  BP:  126/65 (!)  110/56 (!) 104/57  Pulse: 98 95 91 91  Resp: (!) 22 20 20 20   Temp:      TempSrc:      SpO2: 95% 96% 94% 96%  Weight:      Height:        Intake/Output Summary (Last 24 hours) at 04/17/2024 1710 Last data filed at 04/16/2024 1848 Gross per 24 hour  Intake 560.49 ml  Output --  Net 560.49 ml   Filed Weights   04/10/24 1006 04/10/24 1357  Weight: 92.4 kg 93.4 kg   Examination: General exam: Alert, awake, oriented x 3; status post paracentesis at time of examination; reporting feeling better breathing easier.  No requiring oxygen supplementation. Respiratory system: No wheezing, no crackles, no using accessory muscles. Cardiovascular system: Rate controlled, no rubs, no gallops, no JVD appreciated on exam. Gastrointestinal system: Abdomen is less distended, nontender on palpation and with positive bowel sounds. Central nervous system: No focal neurological deficits. Extremities: No cyanosis or clubbing.  Trace edema appreciated bilaterally. Skin: No petechiae. Psychiatry: Judgement and insight appear normal.  Flat affect appreciated on exam.  Data Reviewed: I have personally reviewed following labs and imaging studies  CBC: Recent Labs  Lab 04/11/24 0435 04/14/24 0525 04/15/24 0436 04/17/24 0500  WBC 4.4 4.9 4.0 7.0  HGB 8.9* 9.3* 8.6* 8.2*  HCT 28.0* 29.6* 27.0* 25.6*  MCV 84.3 85.3 84.9 85.6  PLT 138* 172 158 144*    Basic Metabolic Panel: Recent Labs  Lab 04/13/24 0315 04/14/24 0525 04/15/24 0436 04/16/24 0445 04/17/24 0504  NA 139 139 138 137 136  K 4.5 5.1 4.7 5.2* 5.3*  CL 106 105 106 104 100  CO2 20* 21* 19* 18* 21*  GLUCOSE 81 96 96 91 121*  BUN 43* 48* 51* 59* 70*  CREATININE 1.53* 1.91* 1.90* 2.35* 3.01*  CALCIUM  8.3* 8.5* 8.2* 8.2* 8.3*  PHOS  --   --   --  5.3* 6.4*    CBG: Recent Labs  Lab 04/16/24 1626 04/16/24 2031 04/17/24 0730 04/17/24 1045 04/17/24 1617  GLUCAP 136* 203* 120* 115* 95    Recent Results (from the past 240 hours)   Gram stain     Status: None   Collection Time: 04/17/24  9:28 AM   Specimen: Ascitic  Result Value Ref Range Status   Specimen Description ASCITIC  Final   Special Requests ASCITIC  Final   Gram Stain   Final    WBC PRESENT,BOTH PMN AND MONONUCLEAR NO ORGANISMS SEEN CYTOSPIN SMEAR Performed at [facility], [address] [place], [place] [address]    Report Status 04/17/2024 FINAL  Final     Radiology Studies: US  CORE BIOPSY (LYMPH NODES) Result Date: 04/17/2024 INDICATION: Left groin lymphadenopathy. EXAM: ULTRASOUND GUIDED core needle  BIOPSY OF left inguinal region MEDICATIONS: None. ANESTHESIA/SEDATION: Fentanyl  1.5 mcg IV; Versed  50 mg IV Moderate Sedation Time:  10 The patient was continuously monitored during the procedure by the interventional radiology nurse under my direct supervision. PROCEDURE: The procedure, risks, benefits, and alternatives were explained to the patient. Questions regarding the procedure were encouraged and answered. The patient understands and consents to the procedure. The left groin was prepped with chlorhexidine  in a sterile fashion, and a sterile drape was applied covering the operative field. A sterile gown and sterile gloves were used  for the procedure. Local anesthesia was provided with 1% Lidocaine . An introducer needle was advanced through a small incision made in the mid aspect of the left groin. The needle was advanced under ultrasound guidance until the needle tip was engaged within the abnormally enlarged lymph node. The stylet was removed and the BioPince needle was then advanced through the introducer needle until the BioPince needle was engaged within the lymph node tissue. A 2.3 cm core sample was obtained. Sample was placed in saline. A second sample was obtained in similar fashion. All needles removed from the patient. Sterile dressing applied. Final image demonstrates no active hemorrhage. COMPLICATIONS: Satisfactory core needle biopsy of left  inguinal region lymph node. Electronically Signed   By: Cordella Banner   On: 04/17/2024 15:24   US  Paracentesis Result Date: 04/17/2024 INDICATION: Patient with enlarging mesenteric mass concerning for lymphoma, new onset ascites. Request for diagnostic and therapeutic paracentesis. EXAM: ULTRASOUND GUIDED DIAGNOSTIC AND THERAPEUTIC PARACENTESIS MEDICATIONS: 6 mL 1% lidocaine  COMPLICATIONS: None immediate. PROCEDURE: Informed written consent was obtained from the patient after a discussion of the risks, benefits and alternatives to treatment. A timeout was performed prior to the initiation of the procedure. Initial ultrasound scanning demonstrates a moderate amount of ascites within the right lower abdominal quadrant. The right lower abdomen was prepped and draped in the usual sterile fashion. 1% lidocaine  was used for local anesthesia. Following this, a 19 gauge, 7-cm, Yueh catheter was introduced. An ultrasound image was saved for documentation purposes. The paracentesis was performed. The catheter was removed and a dressing was applied. The patient tolerated the procedure well without immediate post procedural complication. FINDINGS: A total of approximately 2.2 L of bloody fluid was removed. Samples were sent to the laboratory as requested by the clinical team. IMPRESSION: Successful ultrasound-guided paracentesis yielding 2.2 liters of peritoneal fluid. Performed by Clotilda Hesselbach, PA-C Electronically Signed   By: CHRISTELLA.  Shick M.D.   On: 04/17/2024 11:48   CT ABDOMEN PELVIS WO CONTRAST Result Date: 04/16/2024 EXAM: CT ABDOMEN AND PELVIS WITHOUT CONTRAST 04/15/2024 10:17:55 PM TECHNIQUE: CT of the abdomen and pelvis was performed without the administration of intravenous contrast. Multiplanar reformatted images are provided for review. Automated exposure control, iterative reconstruction, and/or weight-based adjustment of the mA/kV was utilized to reduce the radiation dose to as low as reasonably  achievable. COMPARISON: Comparison with the 04/04/2024 CLINICAL HISTORY: Abdominal pain, acute, nonlocalized. FINDINGS: LOWER CHEST: Trace bilateral pleural effusions. LIVER: The liver is unremarkable. GALLBLADDER AND BILE DUCTS: Similar reactive gallbladder wall thickening. No biliary ductal dilatation. SPLEEN: No acute abnormality. PANCREAS: Unchanged ill-defined soft tissue about the pancreatic head. This was better assessed on prior CT with contrast. ADRENAL GLANDS: No acute abnormality. KIDNEYS, URETERS AND BLADDER: No stones in the kidneys or ureters. No hydronephrosis. No perinephric or periureteral stranding. Foley catheter in the decompressed bladder. GI AND BOWEL: Oral contrast is present within the stomach and small bowel. There is dilute contrast in the colon. Wall thickening about the terminal ileum and ascending and transverse colon is grossly similar to prior given differences in contrast administration. There is no bowel obstruction. PERITONEUM AND RETROPERITONEUM: Increased small to moderate abdominal pelvic ascites. Diffuse mesenteric stranding is similar. Redemonstrated ill-defined soft tissue density throughout the base of the mesentery. This was better evaluated with CT with IV contrast on 04/04/2024 but is grossly similar. No free air. VASCULATURE: Aorta is normal in caliber. Aortic atherosclerotic calcification. LYMPH NODES: Unchanged left periaortic lymphadenopathy measuring up to 1.7 cm on series  2 image 45. Similar enlargement of a left inguinal lymph node measuring 1.3 cm. Similar enlargement of a pericardial lymph node measuring 1.8 cm on series 2 image 14. REPRODUCTIVE ORGANS: No acute abnormality. BONES AND SOFT TISSUES: No acute osseous abnormality. No focal soft tissue abnormality. IMPRESSION: 1. Grossly similar ill-defined soft tissue throughout the base of the mesentery. 2. Increased small to moderate abdominopelvic ascites since 11 / 12 / 25 . 3. Wall thickening about the terminal  ileum and ascending and transverse colon, grossly similar to prior given differences in contrast administration. 4. Unchanged left periaortic, pericardiac, and left inguinal lymphadenopathy. Electronically signed by: Norman Gatlin MD 04/16/2024 02:38 AM EST RP Workstation: [id number]     Scheduled Meds:  aspirin  EC  81 mg Oral Daily   Chlorhexidine  Gluconate Cloth  6 each Topical Daily   feeding supplement  1 Container Oral BID BM   fluticasone  2 spray Each Nare Daily   heparin   5,000 Units Subcutaneous Q8H   insulin  aspart  0-9 Units Subcutaneous TID WC   labetalol   200 mg Oral BID   loratadine  10 mg Oral Daily   multivitamin with minerals  1 tablet Oral Daily   pantoprazole   40 mg Oral QPM   [START ON 04/18/2024] predniSONE  60 mg Oral Q breakfast   simethicone   80 mg Oral QID   sodium zirconium cyclosilicate  10 g Oral BID   Continuous Infusions:  rasburicase (ELITEK) 6 mg in sodium chloride  0.9 % 46 mL IVPB     sodium bicarbonate 150 mEq in sterile water  1,150 mL infusion 75 mL/hr at 04/17/24 0110     LOS: 7 days   Time spent: 50 mins  Eric Nunnery, MD How to contact the [facility] Attending or Consulting provider 7A - 7P or covering provider during after hours 7P -7A, for this patient?  Check the care team in [facility] and look for a) attending/consulting TR[facility] provider listed and b) the TR[facility] team listed Log into [email or web address] to find provider on call.  Locate the [facility] provider you are looking for under [facility] and page to a number that you can be directly reached. If you still have difficulty reaching the provider, please page the [facility] (Director on Call) for the Hospitalists listed on amion for assistance.  04/17/2024, 5:10 PM

## 2024-04-17 NOTE — Progress Notes (Signed)
 Patient ID: Cody Palmer, male   DOB: 24-Jan-1933, 89 y.o.   MRN: [id number] S: Mr. Mccarney had US -guided paracentesis this morning of 2.2 liters.  No UOP documented but has over 600 mL in foley bag. O:BP (!) 104/57   Pulse 91   Temp (!) 97.5 F (36.4 C) (Oral)   Resp 20   Ht 5' 9 (1.753 m)   Wt 93.4 kg   SpO2 96%   BMI 30.42 kg/m   Intake/Output Summary (Last 24 hours) at 04/17/2024 1004 Last data filed at 04/16/2024 1848 Gross per 24 hour  Intake 800.49 ml  Output --  Net 800.49 ml   Intake/Output: I/O last 3 completed shifts: In: 1000.5 [P.O.:440; I.V.:560.5] Out: -   Intake/Output this shift:  No intake/output data recorded. Weight change:  Gen: mild distress with tachypnea and abdominal distention. CVS: RRR Resp: decreased BS at bases Abd: distended, tense, +BS, mildly tender to palpation Ext: trace-1+ pretibial edema  Recent Labs  Lab 04/10/24 1046 04/11/24 0435 04/12/24 0454 04/13/24 0315 04/14/24 0525 04/15/24 0436 04/16/24 0445 04/17/24 0500 04/17/24 0504  NA 134* 138 139 139 139 138 137  --  136  K 5.0 4.3 4.3 4.5 5.1 4.7 5.2*  --  5.3*  CL 100 104 106 106 105 106 104  --  100  CO2 20* 20* 21* 20* 21* 19* 18*  --  21*  GLUCOSE 102* 79 94 81 96 96 91  --  121*  BUN 60* 56* 43* 43* 48* 51* 59*  --  70*  CREATININE 2.45* 1.97* 1.45* 1.53* 1.91* 1.90* 2.35*  --  3.01*  ALBUMIN 3.6  --   --   --   --   --  3.2*  3.2* 3.5 3.3*  CALCIUM  8.3* 7.9* 8.2* 8.3* 8.5* 8.2* 8.2*  --  8.3*  PHOS  --   --   --   --   --   --  5.3*  --  6.4*  AST 70*  --   --   --   --   --  61* 53*  --   ALT 50*  --   --   --   --   --  35 33  --    Liver Function Tests: Recent Labs  Lab 04/10/24 1046 04/16/24 0445 04/17/24 0500 04/17/24 0504  AST 70* 61* 53*  --   ALT 50* 35 33  --   ALKPHOS 79 74 77  --   BILITOT 0.6 0.5 0.5  --   PROT 5.6* 5.1* 5.5*  --   ALBUMIN 3.6 3.2*  3.2* 3.5 3.3*   No results for input(s): LIPASE, AMYLASE in the last 168 hours. No  results for input(s): AMMONIA in the last 168 hours. CBC: Recent Labs  Lab 04/10/24 1046 04/11/24 0435 04/14/24 0525 04/15/24 0436 04/17/24 0500  WBC 5.0 4.4 4.9 4.0 7.0  NEUTROABS 2.9  --   --   --   --   HGB 9.3* 8.9* 9.3* 8.6* 8.2*  HCT 28.9* 28.0* 29.6* 27.0* 25.6*  MCV 83.8 84.3 85.3 84.9 85.6  PLT 153 138* 172 158 144*   Cardiac Enzymes: No results for input(s): CKTOTAL, CKMB, CKMBINDEX, TROPONINI in the last 168 hours. CBG: Recent Labs  Lab 04/16/24 0745 04/16/24 1114 04/16/24 1626 04/16/24 2031 04/17/24 0730  GLUCAP 95 125* 136* 203* 120*    Iron Studies: No results for input(s): IRON, TIBC, TRANSFERRIN, FERRITIN in the last 72 hours. Studies/Results: CT ABDOMEN  PELVIS WO CONTRAST Result Date: 04/16/2024 EXAM: CT ABDOMEN AND PELVIS WITHOUT CONTRAST 04/15/2024 10:17:55 PM TECHNIQUE: CT of the abdomen and pelvis was performed without the administration of intravenous contrast. Multiplanar reformatted images are provided for review. Automated exposure control, iterative reconstruction, and/or weight-based adjustment of the mA/kV was utilized to reduce the radiation dose to as low as reasonably achievable. COMPARISON: Comparison with the 04/04/2024 CLINICAL HISTORY: Abdominal pain, acute, nonlocalized. FINDINGS: LOWER CHEST: Trace bilateral pleural effusions. LIVER: The liver is unremarkable. GALLBLADDER AND BILE DUCTS: Similar reactive gallbladder wall thickening. No biliary ductal dilatation. SPLEEN: No acute abnormality. PANCREAS: Unchanged ill-defined soft tissue about the pancreatic head. This was better assessed on prior CT with contrast. ADRENAL GLANDS: No acute abnormality. KIDNEYS, URETERS AND BLADDER: No stones in the kidneys or ureters. No hydronephrosis. No perinephric or periureteral stranding. Foley catheter in the decompressed bladder. GI AND BOWEL: Oral contrast is present within the stomach and small bowel. There is dilute contrast in the colon.  Wall thickening about the terminal ileum and ascending and transverse colon is grossly similar to prior given differences in contrast administration. There is no bowel obstruction. PERITONEUM AND RETROPERITONEUM: Increased small to moderate abdominal pelvic ascites. Diffuse mesenteric stranding is similar. Redemonstrated ill-defined soft tissue density throughout the base of the mesentery. This was better evaluated with CT with IV contrast on 04/04/2024 but is grossly similar. No free air. VASCULATURE: Aorta is normal in caliber. Aortic atherosclerotic calcification. LYMPH NODES: Unchanged left periaortic lymphadenopathy measuring up to 1.7 cm on series 2 image 45. Similar enlargement of a left inguinal lymph node measuring 1.3 cm. Similar enlargement of a pericardial lymph node measuring 1.8 cm on series 2 image 14. REPRODUCTIVE ORGANS: No acute abnormality. BONES AND SOFT TISSUES: No acute osseous abnormality. No focal soft tissue abnormality. IMPRESSION: 1. Grossly similar ill-defined soft tissue throughout the base of the mesentery. 2. Increased small to moderate abdominopelvic ascites since 11 / 12 / 25 . 3. Wall thickening about the terminal ileum and ascending and transverse colon, grossly similar to prior given differences in contrast administration. 4. Unchanged left periaortic, pericardiac, and left inguinal lymphadenopathy. Electronically signed by: Norman Gatlin MD 04/16/2024 02:38 AM EST RP Workstation: [id number]    aspirin  EC  81 mg Oral Daily   Chlorhexidine  Gluconate Cloth  6 each Topical Daily   feeding supplement  1 Container Oral BID BM   fluticasone  2 spray Each Nare Daily   heparin   5,000 Units Subcutaneous Q8H   insulin  aspart  0-9 Units Subcutaneous TID WC   labetalol   200 mg Oral BID   loratadine  10 mg Oral Daily   multivitamin with minerals  1 tablet Oral Daily   pantoprazole   40 mg Oral QPM   simethicone   80 mg Oral QID   sodium zirconium cyclosilicate  10 g Oral Daily     BMET    Component Value Date/Time   NA 136 04/17/2024 0504   NA 139 03/27/2024 1513   K 5.3 (H) 04/17/2024 0504   CL 100 04/17/2024 0504   CO2 21 (L) 04/17/2024 0504   GLUCOSE 121 (H) 04/17/2024 0504   BUN 70 (H) 04/17/2024 0504   BUN 32 (H) 03/27/2024 1513   CREATININE 3.01 (H) 04/17/2024 0504   CALCIUM  8.3 (L) 04/17/2024 0504   GFRNONAA 19 (L) 04/17/2024 0504   GFRAA >60 04/19/2016 1459   CBC    Component Value Date/Time   WBC 7.0 04/17/2024 0500   RBC 2.99 (L) 04/17/2024  0500   HGB 8.2 (L) 04/17/2024 0500   HGB 12.7 (L) 03/27/2024 1513   HCT 25.6 (L) 04/17/2024 0500   HCT 38.9 03/27/2024 1513   PLT 144 (L) 04/17/2024 0500   PLT 126 (L) 03/27/2024 1513   MCV 85.6 04/17/2024 0500   MCV 85 03/27/2024 1513   MCH 27.4 04/17/2024 0500   MCHC 32.0 04/17/2024 0500   RDW 18.2 (H) 04/17/2024 0500   RDW 14.9 03/27/2024 1513   LYMPHSABS 1.4 04/10/2024 1046   LYMPHSABS 3.2 (H) 03/27/2024 1513   MONOABS 0.5 04/10/2024 1046   EOSABS 0.1 04/10/2024 1046   EOSABS 0.1 03/27/2024 1513   BASOSABS 0.0 04/10/2024 1046   BASOSABS 0.1 03/27/2024 1513     Assessment/Plan:  AKI - likely ischemic ATN due to combination of volume depletion due to poor po intake along with diarrhea and concomitant ARB/HCT and SGLT2-inhibitor.  Continue to hold losartan  hct and jardiance .  Scr improved with IVF's but worsened after stopping.  Scr continues to climb, will resume IVF's and follow UOP and Scr.  Continue with foley catheter placement given symptoms of retention and poor UOP documented.  No indication for dialysis at this time.  Also on the differential diagnosis is abdominal compartment syndrome given tense abdominal distention.  Renal US  without obstruction.  Continue to follow UOP and Scr closely.  May need kidney biopsy if no improvement (could have involvement with lymphoma).  FeNa low consistent with pre-renal causes.  Will see if they can use pressure sensor with foley to r/o abdominal  compartment syndrome. He is unfortunately nearing the need for RRT.  Could also be tumor lysis syndrome with elevated uric acid and LDH, K, and phos.  Oncology following.   Avoid nephrotoxic medications including NSAIDs and iodinated intravenous contrast exposure unless the latter is absolutely indicated.   Preferred narcotic agents for pain control are hydromorphone , fentanyl , and methadone. Morphine  should not be used.  Avoid Baclofen and avoid oral sodium phosphate  and magnesium  citrate based laxatives / bowel preps.  Continue strict Input and Output monitoring.  Will monitor the patient closely with you and intervene or adjust therapy as indicated by changes in clinical status/labs  Abdominal distention - worsening since admission per daughter.  Enlarging mesenteric mass concerning for lymphoma.  Repeat CT scan with po contrast without evidence of SBO.  GI following.  Was to have PET scan on 04/18/24 and lymph node biopsy.  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   Not sure he would need lymph node biopsy to make diagnosis given path report.  Appreciate Heme/Onc consult and to have CT guided biopsy at [facility] of lymph nodes later today.  Hopefully can start treatment while an inpatient. Hyperkalemia - will start isotonic bicarb due to AGMA and Lokelma  10 gm daily. Increasing ascites - now moderate ascites.  Appreciate IR assistance for US  guided paracentesis of 2.2 liters of bloody fluid for patient comfort. Would also send cytology.  Will dose with IV albumin  and follow.  Abnormal LFT's - unclear etiology as CT of abd and pelvis without cirrhosis or suspicious focal abnormality.  Avoid hepatotoxic agents.  Improving. Anemia - ESA on hold given likely malignancy.  Oncology following.   Elevated LDH also concerning.   Fairy RONAL Sellar, MD [facility]

## 2024-04-17 NOTE — Procedures (Signed)
 PROCEDURE SUMMARY:  Successful image-guided paracentesis from the left lower abdomen.  Yielded 2.2 liters of bloody fluid.  No immediate complications.  EBL < 5 mL Patient tolerated well.   Specimen was sent for labs.  Please see imaging section of Epic for full dictation.  Clotilda DELENA Hesselbach PA-C 04/17/2024 9:48 AM

## 2024-04-17 NOTE — Progress Notes (Signed)
 Patient tolerated left sided paracentesis procedure well today and 2.2 Liters of serosanguinous fluid removed with labs collected and sent to lab for processing. Patient verbalized understanding of post procedure instructions and transported back to inpatient bed assignment this no acute distress noted. Report given to Daphne, RN at completion of procedure.

## 2024-04-18 ENCOUNTER — Inpatient Hospital Stay (HOSPITAL_COMMUNITY)
Admission: RE | Admit: 2024-04-18 | Discharge: 2024-04-18 | Disposition: A | Source: Ambulatory Visit | Attending: Oncology | Admitting: Oncology

## 2024-04-18 DIAGNOSIS — N179 Acute kidney failure, unspecified: Secondary | ICD-10-CM | POA: Diagnosis not present

## 2024-04-18 DIAGNOSIS — R627 Adult failure to thrive: Secondary | ICD-10-CM | POA: Diagnosis not present

## 2024-04-18 DIAGNOSIS — R531 Weakness: Secondary | ICD-10-CM | POA: Diagnosis not present

## 2024-04-18 DIAGNOSIS — Z66 Do not resuscitate: Secondary | ICD-10-CM | POA: Diagnosis not present

## 2024-04-18 LAB — GLUCOSE, CAPILLARY
Glucose-Capillary: 125 mg/dL — ABNORMAL HIGH (ref 70–99)
Glucose-Capillary: 113 mg/dL — ABNORMAL HIGH (ref 70–99)
Glucose-Capillary: 133 mg/dL — ABNORMAL HIGH (ref 70–99)
Glucose-Capillary: 162 mg/dL — ABNORMAL HIGH (ref 70–99)

## 2024-04-18 LAB — COMPREHENSIVE METABOLIC PANEL WITH GFR
ALT: 25 U/L (ref 0–44)
ALT: 22 U/L (ref 0–44)
AST: 42 U/L — ABNORMAL HIGH (ref 15–41)
AST: 37 U/L (ref 15–41)
Albumin: 3.1 g/dL — ABNORMAL LOW (ref 3.5–5.0)
Albumin: 3 g/dL — ABNORMAL LOW (ref 3.5–5.0)
Alkaline Phosphatase: 74 U/L (ref 38–126)
Alkaline Phosphatase: 74 U/L (ref 38–126)
Anion gap: 18 — ABNORMAL HIGH (ref 5–15)
Anion gap: 16 — ABNORMAL HIGH (ref 5–15)
BUN: 76 mg/dL — ABNORMAL HIGH (ref 8–23)
BUN: 84 mg/dL — ABNORMAL HIGH (ref 8–23)
CO2: 20 mmol/L — ABNORMAL LOW (ref 22–32)
CO2: 24 mmol/L (ref 22–32)
Calcium: 8 mg/dL — ABNORMAL LOW (ref 8.9–10.3)
Calcium: 7.7 mg/dL — ABNORMAL LOW (ref 8.9–10.3)
Chloride: 98 mmol/L (ref 98–111)
Chloride: 96 mmol/L — ABNORMAL LOW (ref 98–111)
Creatinine, Ser: 3.06 mg/dL — ABNORMAL HIGH (ref 0.61–1.24)
Creatinine, Ser: 2.95 mg/dL — ABNORMAL HIGH (ref 0.61–1.24)
GFR, Estimated: 19 mL/min — ABNORMAL LOW (ref >60)
GFR, Estimated: 20 mL/min — ABNORMAL LOW (ref >60)
Glucose, Bld: 111 mg/dL — ABNORMAL HIGH (ref 70–99)
Glucose, Bld: 148 mg/dL — ABNORMAL HIGH (ref 70–99)
Potassium: 4.8 mmol/L (ref 3.5–5.1)
Potassium: 5 mmol/L (ref 3.5–5.1)
Sodium: 137 mmol/L (ref 135–145)
Sodium: 135 mmol/L (ref 135–145)
Total Bilirubin: 0.5 mg/dL (ref 0.0–1.2)
Total Bilirubin: 0.4 mg/dL (ref 0.0–1.2)
Total Protein: 5.3 g/dL — ABNORMAL LOW (ref 6.5–8.1)
Total Protein: 5.1 g/dL — ABNORMAL LOW (ref 6.5–8.1)

## 2024-04-18 LAB — MISC LABCORP TEST (SEND OUT): Labcorp test code: 1057

## 2024-04-18 LAB — HEPATITIS B SURFACE ANTIGEN: Hepatitis B Surface Ag: NONREACTIVE

## 2024-04-18 LAB — LACTATE DEHYDROGENASE
LDH: 1145 U/L — ABNORMAL HIGH (ref 105–235)
LDH: 1090 U/L — ABNORMAL HIGH (ref 105–235)

## 2024-04-18 LAB — PHOSPHORUS: Phosphorus: 6.9 mg/dL — ABNORMAL HIGH (ref 2.5–4.6)

## 2024-04-18 MED ORDER — CHLORHEXIDINE GLUCONATE CLOTH 2 % EX PADS
6.0000 | MEDICATED_PAD | Freq: Every day | CUTANEOUS | Status: DC
  Administered 2024-04-18 – 2024-04-24 (×7): 6 via TOPICAL

## 2024-04-18 MED ORDER — SODIUM CHLORIDE 0.9 % IV SOLN
6.0000 mg | Freq: Once | INTRAVENOUS | Status: AC
  Administered 2024-04-18: 6 mg via INTRAVENOUS
  Filled 2024-04-18: qty 4

## 2024-04-18 MED ORDER — CIPROFLOXACIN IN D5W 400 MG/200ML IV SOLN
400.0000 mg | Freq: Once | INTRAVENOUS | Status: AC
  Administered 2024-04-18: 400 mg via INTRAVENOUS
  Filled 2024-04-18: qty 200

## 2024-04-18 MED ORDER — TRAZODONE HCL 50 MG PO TABS
50.0000 mg | ORAL_TABLET | Freq: Every evening | ORAL | Status: DC | PRN
  Administered 2024-04-18 – 2024-04-23 (×5): 50 mg via ORAL
  Filled 2024-04-18 (×5): qty 1

## 2024-04-18 MED ORDER — MELATONIN 3 MG PO TABS
3.0000 mg | ORAL_TABLET | Freq: Every day | ORAL | Status: DC
  Administered 2024-04-18 – 2024-04-23 (×6): 3 mg via ORAL
  Filled 2024-04-18 (×6): qty 1

## 2024-04-18 MED ORDER — CIPROFLOXACIN IN D5W 400 MG/200ML IV SOLN
400.0000 mg | INTRAVENOUS | Status: DC
  Administered 2024-04-19 – 2024-04-24 (×6): 400 mg via INTRAVENOUS
  Filled 2024-04-18 (×6): qty 200

## 2024-04-18 MED ORDER — FUROSEMIDE 10 MG/ML IJ SOLN
80.0000 mg | Freq: Once | INTRAMUSCULAR | Status: AC
  Administered 2024-04-18: 80 mg via INTRAVENOUS
  Filled 2024-04-18: qty 8

## 2024-04-18 NOTE — Significant Event (Signed)
       CROSS COVER NOTE  NAME: Cody Palmer MRN: [id number] DOB : [birth year] ATTENDING PHYSICIAN: Ricky Fines, MD    Date of Service   04/18/2024   HPI/Events of Note   [facility] Cross Cover at [facility] received gram negative rods in fluid from paracentesis done yesterday  Interventions   Assessment/Plan: Verification gram negative rods in both aerobic and anaerobic bottles  Cipro ordered per pharmacy dosing        Cody Palmer [facility] NP [facility] Cross Cover 7pm-7am - check amion for availability Pager [phone]

## 2024-04-18 NOTE — Progress Notes (Addendum)
 PROGRESS NOTE   Cody Palmer  FMW:[id number] DOB: 07/04/1932 DOA: 04/10/2024 PCP: [name], NP   Chief Complaint  Patient presents with   Weakness   Level of care: Med-Surg  Brief Admission History:  Cody Palmer is a 89 year old male with stage IIIa CKD, hypertension, hyperlipidemia, type 2 diabetes mellitus, controlled, recently hospitalized for abdominal distention and concern for colitis versus lymphoma.  He was discharged 4 days ago to home.  He returns today due to poor oral intake, almost no appetite, poor fluid consumption and failure to thrive.  He has become increasingly more weak.  He was seen by GI and had colonoscopy on 04/05/2024 with findings of polyps but otherwise pretty normal.  He also had been seen by oncology during last hospital stay and arrangements made for outpatient PET scan on 04/18/2024 and then will proceed with a lymph node biopsy with IR.  Patient has been having nausea and taking Zofran , abdominal distention persist, poor oral intake persists.  He has been having loose diarrheal stools.  Today he is noted to have another AKI and started on IV fluid hydration.  Admission was requested for further management.    Assessment and Plan:  AKI on CKD stage 3a/hyperkalemia/lymphoma. -- due to poor oral intake and diarrhea -- initially started to improve but now trending back up --CT scan of the abdomen has been ordered as recommended by nephrology. -- Continue to follow BMP as creatinine trends down -- Continue avoiding nephrotoxins -Lokelma given And patient's electrolytes improved.  (Potassium 4.6). - Creatinine has worsened up to 3.01; patient's GFR 19. -Continue to follow nephrology service recommendation. - Continue to maintain adequate hydration - Per nephrology service planning to place dialysis catheter.   Essential hypertension  -- stable, resume home meds as able -- resume home labetalol  at reduced dose of 200 mg BID with hold  parameters -Continue to hold the rest of his antihypertensive agents. -Follow vital signs.   Controlled type 2 DM  -- sensitive SSI coverage ordered  CBG (last 3)  Recent Labs    04/17/24 2050 04/18/24 0729 04/18/24 1130  GLUCAP 113* 125* 113*   Generalized Weakness -- secondary to metabolic derangements  -- encourage ambulation and physical interaction  --might need significant assistance at discharge   Adult failure to thrive -- he remains weak, failed recent discharge home -- Appreciate assistance and recommendation by dietitian regarding feeding supplements. -- hopefully he improves with supportive measures -- Continue to follow clinical response   Abdominal distension Question of abdominal lymphoma -- ongoing outpatient work up  -- he is to have a PET scan outpatient on 12/4 -- Oncology service has been consulted. -- he is to have IR lymph node biopsy after PET scan - acute abdomen series demonstrating not SBO - CT scan demonstrated incisional loop dilations without SBO; moderate ascites. -S/P paracentesis (2.2L removed) on 04/17/2024 -IV albumin has been ordered. - Core biopsy has been been performed without complications - Continue to follow uric acid, LDH and complete metabolic panel. - Per oncology service rasburicase x 1 given on 04/17/2024 and patient has been started on prednisone 60 mg twice a day. 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  Gram negative rods peritonitis  - Patient's ascitic fluid examination demonstrating the presence of gram-negative rods in his aerobic and anaerobic cultures. - Ciprofloxacin daily (dose adjusted for renal function) has been started - Patient is afebrile; but since initiation of his  steroids and increased risk for systemic infection we will protect with antibiotics empirically. - Continue to follow sensitivity.   DVT prophylaxis: heparin  Code Status: DNR   Family Communication: daughter bedside updated Disposition: anticipate home when stabilized    Consultants:  Nephrology service, oncology service, interventional radiology and gastroenterology.  Procedures:  See below for x-ray reports.  Antimicrobials:    Subjective: No fever, no chest pain, no nausea, no vomiting.  Per son-in-law at bedside patient ended eating a little bit much during breakfast today.  Despite increasing his urine output renal function on change and GFR has continued to worsen.  Objective: Vitals:   04/17/24 0940 04/17/24 2045 04/18/24 0411 04/18/24 1359  BP: (!) 104/57 (!) 115/54 (!) 119/52 (!) 105/56  Pulse: 91 90 86 78  Resp: 20 19 19 18   Temp:  98 F (36.7 C) 97.7 F (36.5 C)   TempSrc:  Oral Oral   SpO2: 96% 98% 91% 95%  Weight:      Height:        Intake/Output Summary (Last 24 hours) at 04/18/2024 1542 Last data filed at 04/17/2024 2000 Gross per 24 hour  Intake 1015.22 ml  Output 300 ml  Net 715.22 ml   Filed Weights   04/10/24 1006 04/10/24 1357  Weight: 92.4 kg 93.4 kg   Examination: General exam: Alert, awake, oriented x 3; no fever, no chest pain, no nausea, no vomiting.  Despite increasing urine output patient renal function has failed to improved and his GFR has worsened. Respiratory system: Good saturation on room air; no wheezing or crackles on exam. Cardiovascular system:RRR.  No rubs or gallops; no JVD. Gastrointestinal system: Abdomen is less distended on exam; no guarding.  Positive bowel sounds. Central nervous system: Alert and oriented; moving 4 limbs spontaneously.. No focal neurological deficits. Extremities: No cyanosis or clubbing; trace edema appreciated bilaterally. Skin: No petechiae. Psychiatry: Judgement and insight appear normal.  Flat affect appreciated on exam.  Data Reviewed: I have personally reviewed following labs and imaging studies  CBC: Recent Labs  Lab 04/14/24 0525 04/15/24 0436 04/17/24 0500   WBC 4.9 4.0 7.0  HGB 9.3* 8.6* 8.2*  HCT 29.6* 27.0* 25.6*  MCV 85.3 84.9 85.6  PLT 172 158 144*    Basic Metabolic Panel: Recent Labs  Lab 04/15/24 0436 04/16/24 0445 04/17/24 0504 04/17/24 1841 04/18/24 0510  NA 138 137 136 136 137  K 4.7 5.2* 5.3* 4.8 4.8  CL 106 104 100 100 98  CO2 19* 18* 21* 18* 20*  GLUCOSE 96 91 121* 105* 111*  BUN 51* 59* 70* 73* 76*  CREATININE 1.90* 2.35* 3.01* 2.89* 3.06*  CALCIUM  8.2* 8.2* 8.3* 8.2* 8.0*  PHOS  --  5.3* 6.4*  --  6.9*    CBG: Recent Labs  Lab 04/17/24 1045 04/17/24 1617 04/17/24 2050 04/18/24 0729 04/18/24 1130  GLUCAP 115* 95 113* 125* 113*    Recent Results (from the past 240 hours)  Gram stain     Status: None   Collection Time: 04/17/24  9:28 AM   Specimen: Ascitic  Result Value Ref Range Status   Specimen Description ASCITIC  Final   Special Requests ASCITIC  Final   Gram Stain   Final    WBC PRESENT,BOTH PMN AND MONONUCLEAR NO ORGANISMS SEEN CYTOSPIN SMEAR Performed at [facility], [address] [place], [place] [address]    Report Status 04/17/2024 FINAL  Final  Culture, body fluid w Gram Stain-bottle     Status:  Abnormal (Preliminary result)   Collection Time: 04/17/24  9:28 AM   Specimen: Ascitic  Result Value Ref Range Status   Specimen Description   Final    ASCITIC Performed at [facility], [address], [place], [place] [address]    Special Requests   Final    BOTTLES DRAWN AEROBIC AND ANAEROBIC Blood Culture adequate volume Performed at [facility], [address] [place], [place] [address]    Gram Stain   Final    IN BOTH AEROBIC AND ANAEROBIC BOTTLES GRAM NEGATIVE RODS Gram Stain Report Called to,Read Back By and Verified WithBETHA LEOTIS SLICKER @ 1913 ON 04/17/24 JAYSON BUCK Performed at [facility], [address], [place], [place] [address]    Culture (A)  Final    ESCHERICHIA COLI SUSCEPTIBILITIES TO FOLLOW Performed at [facility], [address] [place], [place]  [address]    Report Status PENDING  Incomplete     Radiology Studies: US  CORE BIOPSY (LYMPH NODES) Result Date: 04/17/2024 INDICATION: Left groin lymphadenopathy. EXAM: ULTRASOUND GUIDED core needle  BIOPSY OF left inguinal region MEDICATIONS: None. ANESTHESIA/SEDATION: Fentanyl  1.5 mcg IV; Versed  50 mg IV Moderate Sedation Time:  10 The patient was continuously monitored during the procedure by the interventional radiology nurse under my direct supervision. PROCEDURE: The procedure, risks, benefits, and alternatives were explained to the patient. Questions regarding the procedure were encouraged and answered. The patient understands and consents to the procedure. The left groin was prepped with chlorhexidine  in a sterile fashion, and a sterile drape was applied covering the operative field. A sterile gown and sterile gloves were used for the procedure. Local anesthesia was provided with 1% Lidocaine . An introducer needle was advanced through a small incision made in the mid aspect of the left groin. The needle was advanced under ultrasound guidance until the needle tip was engaged within the abnormally enlarged lymph node. The stylet was removed and the BioPince needle was then advanced through the introducer needle until the BioPince needle was engaged within the lymph node tissue. A 2.3 cm core sample was obtained. Sample was placed in saline. A second sample was obtained in similar fashion. All needles removed from the patient. Sterile dressing applied. Final image demonstrates no active hemorrhage. COMPLICATIONS: Satisfactory core needle biopsy of left inguinal region lymph node. Electronically Signed   By: Cordella Banner   On: 04/17/2024 15:24   US  Paracentesis Result Date: 04/17/2024 INDICATION: Patient with enlarging mesenteric mass concerning for lymphoma, new onset ascites. Request for diagnostic and therapeutic paracentesis. EXAM: ULTRASOUND GUIDED DIAGNOSTIC AND THERAPEUTIC PARACENTESIS  MEDICATIONS: 6 mL 1% lidocaine  COMPLICATIONS: None immediate. PROCEDURE: Informed written consent was obtained from the patient after a discussion of the risks, benefits and alternatives to treatment. A timeout was performed prior to the initiation of the procedure. Initial ultrasound scanning demonstrates a moderate amount of ascites within the right lower abdominal quadrant. The right lower abdomen was prepped and draped in the usual sterile fashion. 1% lidocaine  was used for local anesthesia. Following this, a 19 gauge, 7-cm, Yueh catheter was introduced. An ultrasound image was saved for documentation purposes. The paracentesis was performed. The catheter was removed and a dressing was applied. The patient tolerated the procedure well without immediate post procedural complication. FINDINGS: A total of approximately 2.2 L of bloody fluid was removed. Samples were sent to the laboratory as requested by the clinical team. IMPRESSION: Successful ultrasound-guided paracentesis yielding 2.2 liters of peritoneal fluid. Performed by Clotilda Hesselbach, PA-C Electronically  Signed   By: CHRISTELLA.  Shick M.D.   On: 04/17/2024 11:48     Scheduled Meds:  aspirin  EC  81 mg Oral Daily   Chlorhexidine  Gluconate Cloth  6 each Topical Daily   Chlorhexidine  Gluconate Cloth  6 each Topical Q0600   feeding supplement  1 Container Oral BID BM   fluticasone   2 spray Each Nare Daily   heparin   5,000 Units Subcutaneous Q8H   insulin  aspart  0-9 Units Subcutaneous TID WC   labetalol   200 mg Oral BID   loratadine   10 mg Oral Daily   multivitamin with minerals  1 tablet Oral Daily   pantoprazole   40 mg Oral QPM   predniSONE   60 mg Oral Q breakfast   simethicone   80 mg Oral QID   sodium zirconium cyclosilicate   10 g Oral BID   Continuous Infusions:  [START ON 04/19/2024] ciprofloxacin      sodium bicarbonate 150 mEq in sterile water  1,150 mL infusion Stopped (04/17/24 0825)     LOS: 8 days   Time spent: 50 mins  Eric Nunnery, MD How to contact the [facility] Attending or Consulting provider 7A - 7P or covering provider during after hours 7P -7A, for this patient?  Check the care team in [facility] and look for a) attending/consulting TRH provider listed and b) the TRH team listed Log into [email or web address] to find provider on call.  Locate the TR[facility] provider you are looking for under [facility] and page to a number that you can be directly reached. If you still have difficulty reaching the provider, please page the [facility] (Director on Call) for the Hospitalists listed on amion for assistance.  04/18/2024, 3:42 PM

## 2024-04-18 NOTE — Progress Notes (Signed)
 Lab reported critical lab from paracentesis aerobic and anaerobic fluid showing gram negative rods.  Provider notified.  Will continue to monitor.

## 2024-04-18 NOTE — Plan of Care (Signed)

## 2024-04-18 NOTE — Progress Notes (Addendum)
 Patient ID: Cody Palmer, male   DOB: 05/21/1932, 89 y.o.   MRN: [id number]  Assessment/Plan:  AKI - likely ischemic ATN due to combination of volume depletion due to poor po intake along with diarrhea and concomitant ARB/HCT and SGLT2-inhibitor.  Continue to hold losartan  hct and jardiance .  Scr continues to climb despite being 4L positive during this hospitalization.  Continue with foley catheter placement given symptoms of retention and poor UOP documented.  Also on the differential diagnosis is abdominal compartment syndrome given tense abdominal distention.  Renal US  without obstruction.  Continue to follow UOP and Scr closely.  May need kidney biopsy if no improvement (could have involvement with lymphoma).  FeNa low consistent with pre-renal causes but .  Will see if they can use pressure sensor with foley to r/o abdominal compartment syndrome; messaged RN. He is unfortunately nearing the need for RRT and he may be uremic.  Could also be tumor lysis syndrome with elevated uric acid and LDH, K, and phos.  Oncology following. Will also give challenge with lasix  but don't anticipate good response.  Plan on requesting catheter to initiate dialysis; I spoke with Dr. Mavis who is only able to put in a femoral temporary.  Will try to see if we can get VIR to place a tunneled catheter; prefer to have something in the IJ if possible to allow the patient more mobility..  Avoid nephrotoxic medications including NSAIDs and iodinated intravenous contrast exposure unless the latter is absolutely indicated.   Preferred narcotic agents for pain control are hydromorphone , fentanyl , and methadone. Morphine  should not be used.  Avoid Baclofen and avoid oral sodium phosphate  and magnesium  citrate based laxatives / bowel preps.  Continue strict Input and Output monitoring.  Will monitor the patient closely with you and intervene or adjust therapy as indicated by changes in clinical status/labs  Abdominal distention  - worsening since admission per daughter.  Enlarging mesenteric mass concerning for lymphoma.  Repeat CT scan with po contrast without evidence of SBO.  GI following.  Was to have PET scan on 04/18/24 and lymph node biopsy.  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   Not sure he would need lymph node biopsy to make diagnosis given path report.  Appreciate Heme/Onc consult and CT guided biopsy at [facility].  Hopefully can start treatment while an inpatient. Hyperkalemia - isotonic bicarb due to AGMA and Lokelma  10 gm daily but will initiate dialysis once dialysis catheter is in. Increasing ascites - now moderate ascites.  Appreciate IR assistance for US  guided paracentesis of 2.2 liters of bloody fluid for patient comfort but rapidly reaccumulating. Abnormal LFT's - unclear etiology as CT of abd and pelvis without cirrhosis or suspicious focal abnormality.  Avoid hepatotoxic agents.  Improving. Anemia - ESA on hold given likely malignancy.  Oncology following.   Elevated LDH also concerning.    S: Cody Palmer had US -guided paracentesis on Wed with 2.2 liters but rapidly accumulating; pt also has very poor appetitie with funny taste in his mouth. He has to force himself to eat. ~351mL /24hrs.  O:BP (!) 119/52 (BP Location: Left Arm)   Pulse 86   Temp 97.7 F (36.5 C) (Oral)   Resp 19   Ht 5' 9 (1.753 m)   Wt 93.4 kg   SpO2 91%   BMI 30.42 kg/m   Intake/Output Summary (Last 24 hours) at 04/18/2024 [phone]  Last data filed at 04/17/2024 2000 Gross per 24 hour  Intake 1015.22 ml  Output 300 ml  Net 715.22 ml   Intake/Output: I/O last 3 completed shifts: In: 1015.2 [I.V.:1015.2] Out: 300 [Urine:300]  Intake/Output this shift:  No intake/output data recorded. Weight change:  Gen: mild distress with tachypnea and abdominal distention. CVS: RRR Resp: decreased BS at bases + coarse  rales Abd: distended, tense, +BS, mildly tender to palpation Ext: 1+ pretibial edema  Recent Labs  Lab 04/13/24 0315 04/14/24 0525 04/15/24 0436 04/16/24 0445 04/17/24 0500 04/17/24 0504 04/17/24 1841 04/18/24 0510  NA 139 139 138 137  --  136 136 137  K 4.5 5.1 4.7 5.2*  --  5.3* 4.8 4.8  CL 106 105 106 104  --  100 100 98  CO2 20* 21* 19* 18*  --  21* 18* 20*  GLUCOSE 81 96 96 91  --  121* 105* 111*  BUN 43* 48* 51* 59*  --  70* 73* 76*  CREATININE 1.53* 1.91* 1.90* 2.35*  --  3.01* 2.89* 3.06*  ALBUMIN  --   --   --  3.2*  3.2* 3.5 3.3* 3.2* 3.1*  CALCIUM  8.3* 8.5* 8.2* 8.2*  --  8.3* 8.2* 8.0*  PHOS  --   --   --  5.3*  --  6.4*  --  6.9*  AST  --   --   --  61* 53*  --  46* 42*  ALT  --   --   --  35 33  --  28 25   Liver Function Tests: Recent Labs  Lab 04/17/24 0500 04/17/24 0504 04/17/24 1841 04/18/24 0510  AST 53*  --  46* 42*  ALT 33  --  28 25  ALKPHOS 77  --  78 74  BILITOT 0.5  --  0.5 0.5  PROT 5.5*  --  5.3* 5.3*  ALBUMIN 3.5 3.3* 3.2* 3.1*   No results for input(s): LIPASE, AMYLASE in the last 168 hours. No results for input(s): AMMONIA in the last 168 hours. CBC: Recent Labs  Lab 04/14/24 0525 04/15/24 0436 04/17/24 0500  WBC 4.9 4.0 7.0  HGB 9.3* 8.6* 8.2*  HCT 29.6* 27.0* 25.6*  MCV 85.3 84.9 85.6  PLT 172 158 144*   Cardiac Enzymes: No results for input(s): CKTOTAL, CKMB, CKMBINDEX, TROPONINI in the last 168 hours. CBG: Recent Labs  Lab 04/17/24 0730 04/17/24 1045 04/17/24 1617 04/17/24 2050 04/18/24 0729  GLUCAP 120* 115* 95 113* 125*    Iron Studies: No results for input(s): IRON, TIBC, TRANSFERRIN, FERRITIN in the last 72 hours. Studies/Results: US  CORE BIOPSY (LYMPH NODES) Result Date: 04/17/2024 INDICATION: Left groin lymphadenopathy. EXAM: ULTRASOUND GUIDED core needle  BIOPSY OF left inguinal region MEDICATIONS: None. ANESTHESIA/SEDATION: Fentanyl  1.5 mcg IV; Versed  50 mg IV Moderate Sedation  Time:  10 The patient was continuously monitored during the procedure by the interventional radiology nurse under my direct supervision. PROCEDURE: The procedure, risks, benefits, and alternatives were explained to the patient. Questions regarding the procedure were encouraged and answered. The patient understands and consents to the procedure. The left groin was prepped with chlorhexidine  in a sterile fashion, and a sterile drape was applied covering the operative field. A sterile gown and sterile gloves were used for the procedure. Local anesthesia was provided with 1% Lidocaine . An introducer needle was advanced through a small incision made in the mid aspect of the left groin. The needle was advanced under ultrasound guidance until  the needle tip was engaged within the abnormally enlarged lymph node. The stylet was removed and the BioPince needle was then advanced through the introducer needle until the BioPince needle was engaged within the lymph node tissue. A 2.3 cm core sample was obtained. Sample was placed in saline. A second sample was obtained in similar fashion. All needles removed from the patient. Sterile dressing applied. Final image demonstrates no active hemorrhage. COMPLICATIONS: Satisfactory core needle biopsy of left inguinal region lymph node. Electronically Signed   By: Cordella Banner   On: 04/17/2024 15:24   US  Paracentesis Result Date: 04/17/2024 INDICATION: Patient with enlarging mesenteric mass concerning for lymphoma, new onset ascites. Request for diagnostic and therapeutic paracentesis. EXAM: ULTRASOUND GUIDED DIAGNOSTIC AND THERAPEUTIC PARACENTESIS MEDICATIONS: 6 mL 1% lidocaine  COMPLICATIONS: None immediate. PROCEDURE: Informed written consent was obtained from the patient after a discussion of the risks, benefits and alternatives to treatment. A timeout was performed prior to the initiation of the procedure. Initial ultrasound scanning demonstrates a moderate amount of ascites  within the right lower abdominal quadrant. The right lower abdomen was prepped and draped in the usual sterile fashion. 1% lidocaine  was used for local anesthesia. Following this, a 19 gauge, 7-cm, Yueh catheter was introduced. An ultrasound image was saved for documentation purposes. The paracentesis was performed. The catheter was removed and a dressing was applied. The patient tolerated the procedure well without immediate post procedural complication. FINDINGS: A total of approximately 2.2 L of bloody fluid was removed. Samples were sent to the laboratory as requested by the clinical team. IMPRESSION: Successful ultrasound-guided paracentesis yielding 2.2 liters of peritoneal fluid. Performed by Clotilda Hesselbach, PA-C Electronically Signed   By: CHRISTELLA.  Shick M.D.   On: 04/17/2024 11:48    aspirin  EC  81 mg Oral Daily   Chlorhexidine  Gluconate Cloth  6 each Topical Daily   feeding supplement  1 Container Oral BID BM   fluticasone  2 spray Each Nare Daily   heparin   5,000 Units Subcutaneous Q8H   insulin  aspart  0-9 Units Subcutaneous TID WC   labetalol   200 mg Oral BID   loratadine  10 mg Oral Daily   multivitamin with minerals  1 tablet Oral Daily   pantoprazole   40 mg Oral QPM   predniSONE  60 mg Oral Q breakfast   simethicone   80 mg Oral QID   sodium zirconium cyclosilicate  10 g Oral BID    BMET    Component Value Date/Time   NA 137 04/18/2024 0510   NA 139 03/27/2024 1513   K 4.8 04/18/2024 0510   CL 98 04/18/2024 0510   CO2 20 (L) 04/18/2024 0510   GLUCOSE 111 (H) 04/18/2024 0510   BUN 76 (H) 04/18/2024 0510   BUN 32 (H) 03/27/2024 1513   CREATININE 3.06 (H) 04/18/2024 0510   CALCIUM  8.0 (L) 04/18/2024 0510   GFRNONAA 19 (L) 04/18/2024 0510   GFRAA >60 04/19/2016 1459   CBC    Component Value Date/Time   WBC 7.0 04/17/2024 0500   RBC 2.99 (L) 04/17/2024 0500   HGB 8.2 (L) 04/17/2024 0500   HGB 12.7 (L) 03/27/2024 1513   HCT 25.6 (L) 04/17/2024 0500   HCT 38.9  03/27/2024 1513   PLT 144 (L) 04/17/2024 0500   PLT 126 (L) 03/27/2024 1513   MCV 85.6 04/17/2024 0500   MCV 85 03/27/2024 1513   MCH 27.4 04/17/2024 0500   MCHC 32.0 04/17/2024 0500   RDW 18.2 (H) 04/17/2024 0500  RDW 14.9 03/27/2024 1513   LYMPHSABS 1.4 04/10/2024 1046   LYMPHSABS 3.2 (H) 03/27/2024 1513   MONOABS 0.5 04/10/2024 1046   EOSABS 0.1 04/10/2024 1046   EOSABS 0.1 03/27/2024 1513   BASOSABS 0.0 04/10/2024 1046   BASOSABS 0.1 03/27/2024 1513

## 2024-04-18 NOTE — Progress Notes (Signed)
 Interventional Radiology Brief Note:  Patient in need of tunneled HD catheter placement for initiation of expected long-term dialysis.  He will need [facility] transport to [facility] for procedure with Dr. Jennefer.  Discussed with RN who will arrange.   Orders in place for NPO p MN.   Deseri Loss, MS RD PA-C

## 2024-04-18 NOTE — Progress Notes (Signed)
 Physical Therapy Treatment Patient Details Name: Cody Palmer MRN: [id number] DOB: March 07, 1933 Today's Date: 04/18/2024   History of Present Illness Cody Palmer is a 89 year old male with stage IIIa CKD, hypertension, hyperlipidemia, type 2 diabetes mellitus, controlled, recently hospitalized for abdominal distention and concern for colitis versus lymphoma.  He was discharged 4 days ago to home.  He returns today due to poor oral intake, almost no appetite, poor fluid consumption and failure to thrive.  He has become increasingly more weak.  He was seen by GI and had colonoscopy on 04/05/2024 with findings of polyps but otherwise pretty normal.  He also had been seen by oncology during last hospital stay and arrangements made for outpatient PET scan on 04/18/2024 and then will proceed with a lymph node biopsy with IR.  Patient has been having nausea and taking Zofran , abdominal distention persist, poor oral intake persists.  He has been having loose diarrheal stools.  Today he is noted to have another AKI and started on IV fluid hydration.  Admission was requested for further management.    PT Comments  Pt agreeable with therapy. Able to demonstrate good bed mobility.  Pt fatigues with higher level exercises.  Pt refused to sit in the chair due to being up in the chair all day.  Therapist encouraged pt to complete bed exercises once/hr.      If plan is discharge home, recommend the following: A little help with walking and/or transfers;A lot of help with walking and/or transfers;A little help with bathing/dressing/bathroom;Help with stairs or ramp for entrance;Assistance with cooking/housework   Can travel by private vehicle      Yes   Equipment Recommendations    none   Recommendations for Other Services  none     Precautions / Restrictions Precautions Precautions: Fall Recall of Precautions/Restrictions: Intact Restrictions Weight Bearing Restrictions Per Provider Order: No      Mobility  Bed Mobility Overal bed mobility: Modified Independent                  Transfers Overall transfer level: Modified independent Equipment used: Rolling walker (2 wheels)                             Communication    Cognition Arousal: Alert Behavior During Therapy: WFL for tasks assessed/performed   PT - Cognitive impairments: No apparent impairments                                Cueing  Verbal cuing  Exercises General Exercises - Lower Extremity Ankle Circles/Pumps: Seated, 10 reps, Both Long Arc Quad: 10 reps, Seated, Both Hip Flexion/Marching: Seated, Standing, 10 reps (seated x 10, standing x 5) Mini-Sqauts: 5 reps Side stepping x 3 reps         Pertinent Vitals/Pain Pain Assessment Pain Assessment: No/denies pain       Prior Function   Mod I with assistive device.          PT Goals (current goals can now be found in the care plan section) Acute Rehab PT Goals Patient Stated Goal: return home with family to assist PT Goal Formulation: With patient    Frequency    Min 3X/week      PT Plan  Continue to work on exercises and gait.     Co-evaluation PT/OT/SLP Co-Evaluation/Treatment: Yes Reason for Co-Treatment: To address functional/ADL  transfers PT goals addressed during session: Mobility/safety with mobility;Balance           End of Session Equipment Utilized During Treatment: Gait belt Activity Tolerance: Patient limited by fatigue Patient left: in bed;with call bell/phone within reach;with family/visitor present (Pt states that he has been in the chair all day) Nurse Communication: Mobility status PT Visit Diagnosis: Unsteadiness on feet (R26.81);Other abnormalities of gait and mobility (R26.89);Muscle weakness (generalized) (M62.81)     Time: 8449-8385 PT Time Calculation (min) (ACUTE ONLY): 24 min  Charges:    $Therapeutic Exercise: 23-37 mins PT General Charges $$ ACUTE PT VISIT: 1  Visit                     Montie Metro, PT CLT [phone]  04/18/2024, 4:20 PM

## 2024-04-18 NOTE — Progress Notes (Signed)
 Mobility Specialist Progress Note:    04/18/24 1355  Mobility  Activity Pivoted/transferred from chair to bed  Level of Assistance Minimal assist, patient does 75% or more  Assistive Device None  Distance Ambulated (ft) 3 ft  Range of Motion/Exercises Active;All extremities  Activity Response Tolerated well  Mobility Referral Yes  Mobility visit 1 Mobility  Mobility Specialist Start Time (ACUTE ONLY) 1355  Mobility Specialist Stop Time (ACUTE ONLY) 1412  Mobility Specialist Time Calculation (min) (ACUTE ONLY) 17 min   Pt received in chair, requesting assistance to bed. Required MinA to stand and transfer with no AD. Tolerated well, RN and family in room. All needs met.  Irene Collings Mobility Specialist Please contact via Special Educational Needs Teacher or  Rehab office at [phone]

## 2024-04-19 DIAGNOSIS — Z66 Do not resuscitate: Secondary | ICD-10-CM | POA: Diagnosis not present

## 2024-04-19 DIAGNOSIS — R531 Weakness: Secondary | ICD-10-CM | POA: Diagnosis not present

## 2024-04-19 DIAGNOSIS — R627 Adult failure to thrive: Secondary | ICD-10-CM | POA: Diagnosis not present

## 2024-04-19 DIAGNOSIS — N179 Acute kidney failure, unspecified: Secondary | ICD-10-CM | POA: Diagnosis not present

## 2024-04-19 LAB — COMPREHENSIVE METABOLIC PANEL WITH GFR
ALT: 21 U/L (ref 0–44)
ALT: 20 U/L (ref 0–44)
AST: 36 U/L (ref 15–41)
AST: 35 U/L (ref 15–41)
Albumin: 3 g/dL — ABNORMAL LOW (ref 3.5–5.0)
Albumin: 3.1 g/dL — ABNORMAL LOW (ref 3.5–5.0)
Alkaline Phosphatase: 69 U/L (ref 38–126)
Alkaline Phosphatase: 71 U/L (ref 38–126)
Anion gap: 19 — ABNORMAL HIGH (ref 5–15)
Anion gap: 17 — ABNORMAL HIGH (ref 5–15)
BUN: 84 mg/dL — ABNORMAL HIGH (ref 8–23)
BUN: 84 mg/dL — ABNORMAL HIGH (ref 8–23)
CO2: 22 mmol/L (ref 22–32)
CO2: 25 mmol/L (ref 22–32)
Calcium: 7.5 mg/dL — ABNORMAL LOW (ref 8.9–10.3)
Calcium: 7.5 mg/dL — ABNORMAL LOW (ref 8.9–10.3)
Chloride: 96 mmol/L — ABNORMAL LOW (ref 98–111)
Chloride: 96 mmol/L — ABNORMAL LOW (ref 98–111)
Creatinine, Ser: 2.83 mg/dL — ABNORMAL HIGH (ref 0.61–1.24)
Creatinine, Ser: 2.62 mg/dL — ABNORMAL HIGH (ref 0.61–1.24)
GFR, Estimated: 21 mL/min — ABNORMAL LOW (ref >60)
GFR, Estimated: 23 mL/min — ABNORMAL LOW (ref >60)
Glucose, Bld: 133 mg/dL — ABNORMAL HIGH (ref 70–99)
Glucose, Bld: 173 mg/dL — ABNORMAL HIGH (ref 70–99)
Potassium: 4.7 mmol/L (ref 3.5–5.1)
Potassium: 4.4 mmol/L (ref 3.5–5.1)
Sodium: 137 mmol/L (ref 135–145)
Sodium: 138 mmol/L (ref 135–145)
Total Bilirubin: 0.4 mg/dL (ref 0.0–1.2)
Total Bilirubin: 0.3 mg/dL (ref 0.0–1.2)
Total Protein: 5.1 g/dL — ABNORMAL LOW (ref 6.5–8.1)
Total Protein: 5.2 g/dL — ABNORMAL LOW (ref 6.5–8.1)

## 2024-04-19 LAB — CULTURE, BODY FLUID W GRAM STAIN -BOTTLE: Special Requests: ADEQUATE

## 2024-04-19 LAB — URIC ACID: Uric Acid, Serum: 9.7 mg/dL — ABNORMAL HIGH (ref 3.7–8.6)

## 2024-04-19 LAB — GLUCOSE, CAPILLARY
Glucose-Capillary: 120 mg/dL — ABNORMAL HIGH (ref 70–99)
Glucose-Capillary: 138 mg/dL — ABNORMAL HIGH (ref 70–99)
Glucose-Capillary: 177 mg/dL — ABNORMAL HIGH (ref 70–99)
Glucose-Capillary: 151 mg/dL — ABNORMAL HIGH (ref 70–99)

## 2024-04-19 LAB — LACTATE DEHYDROGENASE
LDH: 1064 U/L — ABNORMAL HIGH (ref 105–235)
LDH: 1041 U/L — ABNORMAL HIGH (ref 105–235)

## 2024-04-19 LAB — PHOSPHORUS: Phosphorus: 7.2 mg/dL — ABNORMAL HIGH (ref 2.5–4.6)

## 2024-04-19 LAB — HEPATITIS B SURFACE ANTIBODY, QUANTITATIVE: Hep B S AB Quant (Post): <3.5 m[IU]/mL — ABNORMAL LOW

## 2024-04-19 MED ORDER — LIDOCAINE-PRILOCAINE 2.5-2.5 % EX CREA: 1.0000 | TOPICAL_CREAM | CUTANEOUS | Status: DC | PRN

## 2024-04-19 MED ORDER — HEPARIN SODIUM (PORCINE) 1000 UNIT/ML DIALYSIS: 1000.0000 [IU] | INTRAMUSCULAR | Status: DC | PRN

## 2024-04-19 MED ORDER — ANTICOAGULANT SODIUM CITRATE 4% (200MG/5ML) IV SOLN: 5.0000 mL | Status: DC | PRN

## 2024-04-19 MED ORDER — LIDOCAINE HCL (PF) 1 % IJ SOLN: 5.0000 mL | INTRAMUSCULAR | Status: DC | PRN

## 2024-04-19 MED ORDER — MEGESTROL ACETATE 40 MG PO TABS
40.0000 mg | ORAL_TABLET | Freq: Every day | ORAL | Status: DC
  Administered 2024-04-19 – 2024-04-24 (×6): 40 mg via ORAL
  Filled 2024-04-19 (×7): qty 1

## 2024-04-19 MED ORDER — ALTEPLASE 2 MG IJ SOLR: 2.0000 mg | Freq: Once | INTRAMUSCULAR | Status: DC | PRN

## 2024-04-19 MED ORDER — PENTAFLUOROPROP-TETRAFLUOROETH EX AERO: 1.0000 | INHALATION_SPRAY | CUTANEOUS | Status: DC | PRN

## 2024-04-19 NOTE — Plan of Care (Signed)
  Problem: Education: Goal: Knowledge of General Education information will improve Description: Including pain rating scale, medication(s)/side effects and non-pharmacologic comfort measures Outcome: Progressing   Problem: Health Behavior/Discharge Planning: Goal: Ability to manage health-related needs will improve Outcome: Progressing   Problem: Clinical Measurements: Goal: Ability to maintain clinical measurements within normal limits will improve Outcome: Progressing   Problem: Activity: Goal: Risk for activity intolerance will decrease Outcome: Progressing   Problem: Nutrition: Goal: Adequate nutrition will be maintained Outcome: Progressing   Problem: Coping: Goal: Level of anxiety will decrease Outcome: Progressing   Problem: Elimination: Goal: Will not experience complications related to bowel motility Outcome: Progressing   Problem: Pain Managment: Goal: General experience of comfort will improve and/or be controlled Outcome: Progressing   Problem: Safety: Goal: Ability to remain free from injury will improve Outcome: Progressing

## 2024-04-19 NOTE — Plan of Care (Signed)

## 2024-04-19 NOTE — Progress Notes (Signed)
 Physical Therapy Treatment Patient Details Name: Cody Palmer MRN: [id number] DOB: 12-06-1932 Today's Date: 04/19/2024   History of Present Illness Cody Palmer is a 89 year old male with stage IIIa CKD, hypertension, hyperlipidemia, type 2 diabetes mellitus, controlled, recently hospitalized for abdominal distention and concern for colitis versus lymphoma.  He was discharged 4 days ago to home.  He returns today due to poor oral intake, almost no appetite, poor fluid consumption and failure to thrive.  He has become increasingly more weak.  He was seen by GI and had colonoscopy on 04/05/2024 with findings of polyps but otherwise pretty normal.  He also had been seen by oncology during last hospital stay and arrangements made for outpatient PET scan on 04/18/2024 and then will proceed with a lymph node biopsy with IR.  Patient has been having nausea and taking Zofran , abdominal distention persist, poor oral intake persists.  He has been having loose diarrheal stools.  Today he is noted to have another AKI and started on IV fluid hydration.  Admission was requested for further management.    PT Comments  Pt. Presented w/ general weakness in LE. Pt tolerated session well. During treatment pt. Was bale to perform bed mobility w/ min A/CGA to EOB. Pt was able to transfer from sit to stand at bedside w/ CGA/ min A and RW and ambulate towards bathroom to commode. Pt was able to use commode and transfer w/ CGA off commode and wash hands at sink. Pt was able to ambulate into hallway w/ RW and CGA. Pt tend to drift during ambulate, w/ cuing pt was bale to self-correct themselves. Towards the end of treatment pt was fatigued, pt was left in chair w/ family at bedside. Nursing staff and MD were notified on pt status. Patient will benefit from continued skilled physical therapy in hospital and recommended venue below to increase strength, balance, endurance for safe ADLs and gait.    If plan is discharge home,  recommend the following: A little help with walking and/or transfers;A lot of help with walking and/or transfers;A little help with bathing/dressing/bathroom;Help with stairs or ramp for entrance;Assistance with cooking/housework   Can travel by private vehicle     Yes  Equipment Recommendations  None recommended by PT    Recommendations for Other Services       Precautions / Restrictions Precautions Precautions: Fall Recall of Precautions/Restrictions: Intact Restrictions Weight Bearing Restrictions Per Provider Order: No     Mobility  Bed Mobility Overal bed mobility: Needs Assistance Bed Mobility: Supine to Sit     Supine to sit: Contact guard, Min assist     General bed mobility comments: LE to EOB    Transfers Overall transfer level: Needs assistance Equipment used: Rolling walker (2 wheels) Transfers: Sit to/from Stand, Bed to chair/wheelchair/BSC Sit to Stand: Contact guard assist, Min assist   Step pivot transfers: Contact guard assist, Min assist       General transfer comment: using RW to commode, used walls/ railing to assist inot bathroom. used gait to assist transfer from sit to stand off commode    Ambulation/Gait Ambulation/Gait assistance: Contact guard assist, Min assist Gait Distance (Feet): 50 Feet Assistive device: Rolling walker (2 wheels) Gait Pattern/deviations: Decreased step length - right, Decreased step length - left, Decreased stride length, Narrow base of support, Drifts right/left Gait velocity: decreased     General Gait Details: used RW into hallway, drifted towards wall slightly, used verbal cuing to assist pt. with self-correction  Stairs             Wheelchair Mobility     Tilt Bed    Modified Rankin (Stroke Patients Only)       Balance Overall balance assessment: Needs assistance Sitting-balance support: Feet supported, No upper extremity supported Sitting balance-Leahy Scale: Good Sitting balance -  Comments: seated at EOB   Standing balance support: During functional activity, Bilateral upper extremity supported Standing balance-Leahy Scale: Fair Standing balance comment: fair/good using RW                            Communication Communication Communication: No apparent difficulties  Cognition Arousal: Alert Behavior During Therapy: WFL for tasks assessed/performed   PT - Cognitive impairments: No apparent impairments                         Following commands: Intact      Cueing Cueing Techniques: Verbal cues  Exercises      General Comments        Pertinent Vitals/Pain Pain Assessment Pain Assessment: No/denies pain    Home Living                          Prior Function            PT Goals (current goals can now be found in the care plan section) Acute Rehab PT Goals Patient Stated Goal: return home with family to assist PT Goal Formulation: With patient Time For Goal Achievement: 04/23/24 Potential to Achieve Goals: Good Progress towards PT goals: Progressing toward goals    Frequency    Min 3X/week      PT Plan      Co-evaluation              AM-PAC PT 6 Clicks Mobility   Outcome Measure  Help needed turning from your back to your side while in a flat bed without using bedrails?: A Little Help needed moving from lying on your back to sitting on the side of a flat bed without using bedrails?: A Little Help needed moving to and from a bed to a chair (including a wheelchair)?: A Little Help needed standing up from a chair using your arms (e.g., wheelchair or bedside chair)?: A Little Help needed to walk in hospital room?: A Little Help needed climbing 3-5 steps with a railing? : A Lot 6 Click Score: 17    End of Session Equipment Utilized During Treatment: Gait belt Activity Tolerance: Patient limited by fatigue;Patient tolerated treatment well Patient left: with call bell/phone within reach;with  family/visitor present;in chair Nurse Communication: Mobility status PT Visit Diagnosis: Unsteadiness on feet (R26.81);Other abnormalities of gait and mobility (R26.89);Muscle weakness (generalized) (M62.81)     Time: 9044-8979 PT Time Calculation (min) (ACUTE ONLY): 25 min  Charges:    $Gait Training: 8-22 mins $Therapeutic Activity: 8-22 mins PT General Charges $$ ACUTE PT VISIT: 1 Visit                     Lyriq Jarchow, SPT

## 2024-04-19 NOTE — Progress Notes (Addendum)
 [facility] Consultation Hematology/Oncology  CONSULTING PHYSICIAN: Dr. Ricky  REASON FOR CONSULT: Abdominal mass    HISTORY OF PRESENT ILLNESS:   Cody Palmer is a 89 y.o. male with past medical history of  hypertension, type 2 diabetes, hyperlipidemia, CKD and severe enteritis who presented to the emergency room with progressive weakness, abdominal distention, urinary retention, poor oral intake and nausea.  GI was consulted and patient had colonoscopy today which shows concerns for abdominal lymphoma.  He had a CT abdomen/pelvis which showed ill-defined soft tissue density throughout the base of the mesentery with diffuse stranding of mesenteric fat and new ascites.  Findings are nonspecific and potentially still inflammatory but suspicious for lymphoma given the associated enlarged pericardiac, retroperitoneal and inguinal lymph nodes.  CT chest showed no evidence of thoracic metastasis, infiltrative process in the upper abdominal mesentery suggestive of peritoneal carcinomatosis.  Patient was discharged on 04/06/2024 with follow-up with us .     Labs from 04/06/24 showed elevated uric acid levels 13.3. He was started on Allopurinol  daily.    He presented back to the emergency room on 04/10/2024 for poor oral intake, no appetite, poor fluid consumption and failure to thrive.  He had also become progressively weak with nausea.  He also was having abdominal distention.   He was scheduled for PET scan and outpatient biopsy but those were not until next week.   Patient had an ultrasound-guided paracentesis on 04/17/2024 with 2.2 L of fluid removed.  Cytology revealed  Reports abdominal distention and he is scheduled for a ultrasound-guided paracentesis.  Most recent CT scan is from yesterday which showed grossly similar ill-defined soft tissue throughout the base of the mesentery, increased small to moderate abdominal pelvic ascites since 03/27/2024, wall thickening around the terminal  ileum and ascending and transverse colon, gross similar to prior given differences in contrast administration.  He had 2.2 fluids removed today.   Surgical pathology from colonoscopy showed several tubular adenomas negative for high-grade dysplasia or malignancy.  Flow cytometry showed abnormal clonal B-cell population comprising of 37% of lymphocytes.  The cells are positive for CD19, CD20, CD22, CD11c and express lambda light chains.  Complete hematologic evaluation including sampling involved lymph nodes may be performed as clinically indicated to rule out lymphoproliferative disorder.  Patient was able to have biopsy of left groin lymph node this past Wednesday.  Results are still pending.  Patient continues to be very uncomfortable.  His abdomen is still distended although he had 2.2 L of fluid on Wednesday.  Overall feels weak but improved from the last 2 days.  He was able to get up with physical therapy today and walk all the way to the nurses station.  He was using a walker.  Reports appetite is still low.  He is only eating a quarter of his food.    MEDICATIONS: I have reviewed the patient's current medications.     PERFORMANCE STATUS: The patient's performance status is 3 - Symptomatic, >50% confined to bed  PHYSICAL EXAM: Most Recent Vital Signs: Blood pressure (!) 134/103, pulse 92, temperature 97.8 F (36.6 C), temperature source Oral, resp. rate 12, height 6' (1.829 m), weight 270 lb (122.5 kg), SpO2 91%.  Physical Exam Constitutional:      Appearance: Normal appearance.  Abdominal:     General: There is distension.     Tenderness: There is no abdominal tenderness.  Skin:    General: Skin is warm.  Neurological:     Mental Status: He is  alert and oriented to person, place, and time.        LABORATORY DATA:   Last CBC Lab Results  Component Value Date   WBC 7.0 04/17/2024   HGB 8.2 (L) 04/17/2024   HCT 25.6 (L) 04/17/2024   MCV 85.6 04/17/2024   MCH 27.4  04/17/2024   RDW 18.2 (H) 04/17/2024   PLT 144 (L) 04/17/2024     Last metabolic panel Lab Results  Component Value Date   GLUCOSE 133 (H) 04/19/2024   NA 137 04/19/2024   K 4.7 04/19/2024   CL 96 (L) 04/19/2024   CO2 22 04/19/2024   BUN 84 (H) 04/19/2024   CREATININE 2.83 (H) 04/19/2024   GFRNONAA 21 (L) 04/19/2024   CALCIUM  7.5 (L) 04/19/2024   PHOS 7.2 (H) 04/19/2024   PROT 5.1 (L) 04/19/2024   ALBUMIN  3.0 (L) 04/19/2024   LABGLOB 2.4 03/27/2024   BILITOT 0.4 04/19/2024   ALKPHOS 69 04/19/2024   AST 36 04/19/2024   ALT 21 04/19/2024   ANIONGAP 19 (H) 04/19/2024      RADIOGRAPHY: US  CORE BIOPSY (LYMPH NODES) INDICATION: Left groin lymphadenopathy.  EXAM: ULTRASOUND GUIDED core needle  BIOPSY OF left inguinal region  MEDICATIONS: None.  ANESTHESIA/SEDATION: Fentanyl  1.5 mcg IV; Versed  50 mg IV  Moderate Sedation Time:  10  The patient was continuously monitored during the procedure by the interventional radiology nurse under my direct supervision.  PROCEDURE: The procedure, risks, benefits, and alternatives were explained to the patient. Questions regarding the procedure were encouraged and answered. The patient understands and consents to the procedure.  The left groin was prepped with chlorhexidine  in a sterile fashion, and a sterile drape was applied covering the operative field. A sterile gown and sterile gloves were used for the procedure. Local anesthesia was provided with 1% Lidocaine .  An introducer needle was advanced through a small incision made in the mid aspect of the left groin. The needle was advanced under ultrasound guidance until the needle tip was engaged within the abnormally enlarged lymph node. The stylet was removed and the BioPince needle was then advanced through the introducer needle until the BioPince needle was engaged within the lymph node tissue. A 2.3 cm core sample was obtained. Sample was placed in saline. A second  sample was obtained in similar fashion. All needles removed from the patient. Sterile dressing applied. Final image demonstrates no active hemorrhage.  COMPLICATIONS: Satisfactory core needle biopsy of left inguinal region lymph node.  Electronically Signed   By: Cordella Banner   On: 04/17/2024 15:24 US  Paracentesis INDICATION: Patient with enlarging mesenteric mass concerning for lymphoma, new onset ascites. Request for diagnostic and therapeutic paracentesis.  EXAM: ULTRASOUND GUIDED DIAGNOSTIC AND THERAPEUTIC PARACENTESIS  MEDICATIONS: 6 mL 1% lidocaine   COMPLICATIONS: None immediate.  PROCEDURE: Informed written consent was obtained from the patient after a discussion of the risks, benefits and alternatives to treatment. A timeout was performed prior to the initiation of the procedure.  Initial ultrasound scanning demonstrates a moderate amount of ascites within the right lower abdominal quadrant. The right lower abdomen was prepped and draped in the usual sterile fashion. 1% lidocaine  was used for local anesthesia.  Following this, a 19 gauge, 7-cm, Yueh catheter was introduced. An ultrasound image was saved for documentation purposes. The paracentesis was performed. The catheter was removed and a dressing was applied. The patient tolerated the procedure well without immediate post procedural complication.  FINDINGS: A total of approximately 2.2 L of bloody fluid was  removed. Samples were sent to the laboratory as requested by the clinical team.  IMPRESSION: Successful ultrasound-guided paracentesis yielding 2.2 liters of peritoneal fluid.  Performed by Clotilda Hesselbach, PA-C  Electronically Signed   By: CHRISTELLA.  Shick M.D.   On: 04/17/2024 11:48        ASSESSMENT: Mr. Plemons is an 89 year old male who presents for persistent abdominal bloating and found to have abdominal mass concerning for lymphoma.  PLAN:  Abdominal mass: Had biopsy with IR of  mesentery lymph node.  Results should be available today. Continue prednisone  60 mg daily for 5 days. Potassium has improved to 4.7, kidney function is better at 2.83 and calcium  is 7.5.  Liver enzymes are within normal limits LDH is still elevated. Preliminary report from recent lymph node biopsy demonstrates likely lymphoma although final results will not be back until later tonight or first thing Monday morning.  Dr. Lanny called was along pathology and spoke to them directly. Discussed plan moving forward with daughter and patient today. Recommend hospitalization with close monitoring for now with labs every 12 hour lab draw of CBC, CMP, uric acid, LDH. For TLS, continue to treat hyperkalemia and uric acid with allopurinol . Will get final pathology from biopsy hopefully tonight or first thing Monday morning. Patient will likely need first chemotherapy while in the hospital but we cannot discuss this until we get final pathology back. Dr. Lanny will be back on Tuesday to discuss with patient.  Weight loss: Will start Megace  once daily to help stimulate appetite.  Abdominal bloating: Had 2.2  L of fluid removed from his abdomen on 04/17/2024.  Cytology demonstrating the presence of gram-negative rods in his aerobic and anaerobic cultures. Ciprofloxacin  was started. Recommend additional paracentesis as needed to keep him comfortable. Continue ciprofloxacin .  AKI: Continue to follow with nephrology. Creatinine slightly better today.  Patient did get Rasburicase  2 days ago which helped. Continue allopurinol  to decrease uric acid.   Thank you for involving us  in this patient's care.  Please to reach out with any questions or concerns.  Cody Palmer, AGNP-C Department of Hematology/Oncology [facility]  Phone: [phone]  04/19/2024 1:52 PM    Addendum I have seen the patient, examined him. I agree with the assessment and and plan and have edited the  notes.   89 year old male with medical history of stage IIIa CKD, hypertension, diabetes, was independent and functional at home at admission, presented with progressive abdominal distention, pain and anorexia and fatigue.  I have reviewed his images, and lab results.  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  He has slightly improved.  I spoke with pathologist today, preliminary is lymphoma, subtype to be determined based on pending thoughts.  I will likely have those results on Monday, if this is a progressive type of lymphoma, may consider starting him chemotherapy Tuesday or Wednesday.  He is a 89 year old, chemotherapy certainly is challenging for him.  He is open to it. Will obtain echocardiogram.  I spoke with his daughter at the bedside.  I spent a total of 60 minutes for his visit today.  Onita Lanny MD 04/19/2024

## 2024-04-19 NOTE — TOC Progression Note (Addendum)
 Transition of Care [facility]) - Progression Note    Patient Details  Name: Cody Palmer MRN: [id number] Date of Birth: Oct 23, 1932  Transition of Care [facility]) CM/SW Contact  Sharlyne Stabs, RN Phone Number: 04/19/2024, 1:46 PM  Clinical Narrative:   PT is now recommending SNF. CM spoke with his daughter, [name]. She is agreeable. Requesting [facility]. FL2 completed and sent out for bed offer. Discharge date is unknown. IPCM following to confirm a bed and start INS Auth when stable.    Addendum: Family requested medicaid screening. Financial advisor consulted and will complete the screening with [name].  Expected Discharge Plan: Skilled Nursing Facility Barriers to Discharge: Continued Medical Work up  Expected Discharge Plan and Services       Living arrangements for the past 2 months: Single Family Home                      Social Drivers of Health (SDOH) Interventions SDOH Screenings   Food Insecurity: No Food Insecurity (04/10/2024)  Housing: Low Risk  (04/10/2024)  Transportation Needs: No Transportation Needs (04/10/2024)  Utilities: Not At Risk (04/10/2024)  Social Connections: Moderately Isolated (04/10/2024)  Tobacco Use: Medium Risk (04/17/2024)    Readmission Risk Interventions    04/19/2024    1:46 PM  Readmission Risk Prevention Plan  Transportation Screening Complete  PCP or Specialist Appt within 3-5 Days Not Complete  HRI or Home Care Consult Complete  Social Work Consult for Recovery Care Planning/Counseling Complete  Palliative Care Screening Not Applicable  Medication Review Oceanographer) Complete

## 2024-04-19 NOTE — Care Management Important Message (Signed)
 Important Message  Patient Details  Name: Cody Palmer MRN: [id number] Date of Birth: 01-19-1933   Important Message Given:  Yes - Medicare IM     [name] 04/19/2024, 1:20 PM

## 2024-04-19 NOTE — [place] FL2 (Signed)
 [place]  MEDICAID FL2 LEVEL OF CARE FORM     IDENTIFICATION  Patient Name: Cody Palmer Birthdate: March 21, 1933 Sex: male Admission Date (Current Location): 04/10/2024  [facility] [place] and [place] Number:  [employer] and Address:  [facility],  [address] [place] [address]      Provider Number: [id number]  Attending Physician Name and Address:  Ricky Fines, MD  Relative Name and Phone Number:  Gwendloyn Cramp (Daughter)  [phone]    Current Level of Care: Hospital Recommended Level of Care: Skilled Nursing Facility Prior Approval Number:    Date Approved/Denied:   PASRR Number: [id number]  Discharge Plan: SNF    Current Diagnoses: Patient Active Problem List   Diagnosis Date Noted   Malnutrition of moderate degree 04/16/2024   AKI (acute kidney injury) 04/10/2024   Generalized weakness 04/10/2024   Failure to thrive in adult 04/10/2024   DNR (do not resuscitate) 04/10/2024   Abdominal distention 04/04/2024   Arthritis of left knee 07/12/2022   Osteoarthritis of left knee 07/12/2022   Chronic kidney disease, stage 3a (HCC) 12/07/2021   Essential hypertension 03/08/2021   Hyperlipidemia 03/08/2021   Type 2 diabetes mellitus without complications (HCC) 03/08/2021   Osteoarthrosis, unspecified whether generalized or localized, lower leg 12/31/2013   S/P total knee replacement 02/21/2013   Stiffness of right knee 01/28/2013   Weakness of right leg 01/28/2013   Difficulty walking 01/28/2013   Arthritis of knee, degenerative 12/13/2012    Orientation RESPIRATION BLADDER Height & Weight     Self, Time, Situation, Place  Normal Continent Weight: 93.4 kg Height:  5' 9 (175.3 cm)  BEHAVIORAL SYMPTOMS/MOOD NEUROLOGICAL BOWEL NUTRITION STATUS      Continent Diet (See DC Summary)  AMBULATORY STATUS COMMUNICATION OF NEEDS Skin   Extensive Assist Verbally Normal                       Personal Care Assistance Level of Assistance   Bathing, Feeding, Dressing Bathing Assistance: Maximum assistance Feeding assistance: Limited assistance Dressing Assistance: Maximum assistance     Functional Limitations Info  Sight, Hearing, Speech Sight Info: Impaired Hearing Info: Impaired Speech Info: Adequate    SPECIAL CARE FACTORS FREQUENCY  PT (By licensed PT)     PT Frequency: 5 times a week              Contractures Contractures Info: Not present    Additional Factors Info  Code Status, Allergies Code Status Info: DNR - limited Allergies Info: NKDA           Current Medications (04/19/2024):  This is the current hospital active medication list Current Facility-Administered Medications  Medication Dose Route Frequency Provider Last Rate Last Admin   acetaminophen  (TYLENOL ) tablet 650 mg  650 mg Oral Q6H PRN Johnson, Clanford L, MD       Or   acetaminophen  (TYLENOL ) suppository 650 mg  650 mg Rectal Q6H PRN Johnson, Clanford L, MD       alteplase  (CATHFLO ACTIVASE ) injection 2 mg  2 mg Intracatheter Once PRN Melia Lynwood ORN, MD       anticoagulant sodium citrate  solution 5 mL  5 mL Intracatheter PRN Melia Lynwood ORN, MD       aspirin  EC tablet 81 mg  81 mg Oral Daily Johnson, Clanford L, MD   81 mg at 04/19/24 9077   bisacodyl  (DULCOLAX) EC tablet 5 mg  5 mg Oral Daily PRN Vicci Afton CROME, MD  Chlorhexidine  Gluconate Cloth 2 % PADS 6 each  6 each Topical Daily Rayburn Pac, MD   6 each at 04/19/24 0944   Chlorhexidine  Gluconate Cloth 2 % PADS 6 each  6 each Topical Q0600 Melia Lynwood ORN, MD   6 each at 04/19/24 0526   ciprofloxacin  (CIPRO ) IVPB 400 mg  400 mg Intravenous Q24H Mattie Marvetta SQUIBB, RPH 200 mL/hr at 04/19/24 0519 400 mg at 04/19/24 0519   feeding supplement (BOOST / RESOURCE BREEZE) liquid 1 Container  1 Container Oral BID BM Ricky Fines, MD   1 Container at 04/16/24 1542   fentaNYL  (SUBLIMAZE ) injection 12.5 mcg  12.5 mcg Intravenous Q2H PRN Johnson, Clanford L, MD       fluticasone   (FLONASE ) 50 MCG/ACT nasal spray 2 spray  2 spray Each Nare Daily Vicci, Clanford L, MD   2 spray at 04/19/24 0944   heparin  injection 1,000 Units  1,000 Units Intracatheter PRN Melia Lynwood ORN, MD       heparin  injection 5,000 Units  5,000 Units Subcutaneous Q8H Vicci, Clanford L, MD   5,000 Units at 04/19/24 9476   insulin  aspart (novoLOG ) injection 0-9 Units  0-9 Units Subcutaneous TID WC Johnson, Clanford L, MD   1 Units at 04/19/24 1304   labetalol  (NORMODYNE ) tablet 200 mg  200 mg Oral BID Vicci, Clanford L, MD   200 mg at 04/19/24 9077   lidocaine  (PF) (XYLOCAINE ) 1 % injection 5 mL  5 mL Intradermal PRN Melia Lynwood ORN, MD       lidocaine -prilocaine  (EMLA ) cream 1 Application  1 Application Topical PRN Melia Lynwood ORN, MD       loratadine  (CLARITIN ) tablet 10 mg  10 mg Oral Daily Vicci, Clanford L, MD   10 mg at 04/19/24 9077   melatonin tablet 3 mg  3 mg Oral QHS Ricky Fines, MD   3 mg at 04/18/24 2244   multivitamin with minerals tablet 1 tablet  1 tablet Oral Daily Ricky Fines, MD   1 tablet at 04/19/24 9078   ondansetron  (ZOFRAN ) tablet 4 mg  4 mg Oral Q6H PRN Johnson, Clanford L, MD       Or   ondansetron  (ZOFRAN ) injection 4 mg  4 mg Intravenous Q6H PRN Johnson, Clanford L, MD       oxyCODONE  (Oxy IR/ROXICODONE ) immediate release tablet 5 mg  5 mg Oral Q6H PRN Johnson, Clanford L, MD   5 mg at 04/15/24 9562   pantoprazole  (PROTONIX ) EC tablet 40 mg  40 mg Oral QPM Johnson, Clanford L, MD   40 mg at 04/18/24 1824   pentafluoroprop-tetrafluoroeth (GEBAUERS) aerosol 1 Application  1 Application Topical PRN Melia Lynwood ORN, MD       polyethylene glycol (MIRALAX  / GLYCOLAX ) packet 17 g  17 g Oral Daily PRN Johnson, Clanford L, MD       predniSONE  (DELTASONE ) tablet 60 mg  60 mg Oral Q breakfast Geofm Delon BRAVO, NP   60 mg at 04/19/24 9078   simethicone  (MYLICON) chewable tablet 80 mg  80 mg Oral QID Johnson, Clanford L, MD   80 mg at 04/19/24 9078   sodium bicarbonate 150 mEq in  sterile water  1,150 mL infusion   Intravenous Continuous Rayburn Pac, MD 75 mL/hr at 04/19/24 0209 New Bag at 04/19/24 0209   sodium zirconium cyclosilicate  (LOKELMA ) packet 10 g  10 g Oral BID Rayburn Pac, MD   10 g at 04/19/24 0920   traZODone  (DESYREL ) tablet 50 mg  50 mg Oral QHS PRN Ricky Fines, MD   50 mg at 04/18/24 2244     Discharge Medications: Please see discharge summary for a list of discharge medications.  Relevant Imaging Results:  Relevant Lab Results:   Additional Information SS# [id number]  Sharlyne Stabs, RN

## 2024-04-19 NOTE — Progress Notes (Signed)
 PROGRESS NOTE   Cody Palmer  FMW:[id number] DOB: [birth year] DOA: 04/10/2024 PCP: [name], NP   Chief Complaint  Patient presents with   Weakness   Level of care: Med-Surg  Brief Admission History:  Cody Palmer is a 89 year old male with stage IIIa CKD, hypertension, hyperlipidemia, type 2 diabetes mellitus, controlled, recently hospitalized for abdominal distention and concern for colitis versus lymphoma.  He was discharged 4 days ago to home.  He returns today due to poor oral intake, almost no appetite, poor fluid consumption and failure to thrive.  He has become increasingly more weak.  He was seen by GI and had colonoscopy on 04/05/2024 with findings of polyps but otherwise pretty normal.  He also had been seen by oncology during last hospital stay and arrangements made for outpatient PET scan on 04/18/2024 and then will proceed with a lymph node biopsy with IR.  Patient has been having nausea and taking Zofran , abdominal distention persist, poor oral intake persists.  He has been having loose diarrheal stools.  Today he is noted to have another AKI and started on IV fluid hydration.  Admission was requested for further management.    Assessment and Plan:  AKI on CKD stage 3a/hyperkalemia/lymphoma. -- due to poor oral intake and diarrhea -- initially started to improve but now trending back up --CT scan of the abdomen has been ordered as recommended by nephrology. -- Continue to follow BMP as creatinine trends down -- Continue avoiding nephrotoxins -Lokelma  given And patient's electrolytes improved.  (Potassium 4.6). - Creatinine has worsened up to 2.83; patient's GFR 19. -Continue to follow nephrology service recommendation. - Continue to maintain adequate hydration; and the setting today plan is for diuretic therapy.  Nephrology service will hold off on placing dialysis catheter at the moment as patient renal function has improved and so far electrolytes are  stable. -No acute indication for dialysis appreciated.   Essential hypertension  -- stable, resume home meds as able -- resume home labetalol  at reduced dose of 200 mg BID with hold parameters -Continue to hold the rest of his antihypertensive agents. -Follow vital signs.   Controlled type 2 DM  -- sensitive SSI coverage ordered  CBG (last 3)  Recent Labs    04/18/24 2031 04/19/24 0731 04/19/24 1128  GLUCAP 162* 120* 138*   Generalized Weakness -- secondary to metabolic derangements  -- encourage ambulation and physical interaction  --might need significant assistance at discharge   Adult failure to thrive -- he remains weak, failed recent discharge home -- Appreciate assistance and recommendation by dietitian regarding feeding supplements. -- hopefully he improves with supportive measures --Follow recommendations by oncology service Megace  has been started. -- Continue to follow clinical response   Abdominal distension Question of abdominal lymphoma -- ongoing outpatient work up  -- he is to have a PET scan outpatient on 12/4 -- Oncology service has been consulted. -- he is to have IR lymph node biopsy after PET scan - acute abdomen series demonstrating not SBO - CT scan demonstrated incisional loop dilations without SBO; moderate ascites. -S/P paracentesis (2.2L removed) on 04/17/2024 -IV albumin  has been ordered. - Core biopsy has been been performed without complications - Continue to follow uric acid, LDH and complete metabolic panel. - Per oncology service rasburicase  x 1 given on 04/17/2024 and patient has been started on prednisone  60 mg twice a day. 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  Gram negative  rods peritonitis  - Patient's ascitic fluid examination demonstrating the presence of gram-negative rods in his aerobic and anaerobic cultures. - Ciprofloxacin  daily (dose adjusted for renal  function) has been started - Patient is afebrile; but since initiation of his steroids and increased risk for systemic infection we will protect with antibiotics empirically. - Adequate antibiotic per sensitivity results.   DVT prophylaxis: heparin  Code Status: DNR  Family Communication: daughter bedside updated Disposition: anticipate home when stabilized    Consultants:  Nephrology service, oncology service, interventional radiology and gastroenterology.  Procedures:  See below for x-ray reports.  Antimicrobials:    Subjective: No fever, no chest pain, no nausea, no vomiting.  Patient reports increase in his urine output.  Objective: Vitals:   04/18/24 1359 04/18/24 2013 04/19/24 0500 04/19/24 1410  BP: (!) 105/56 (!) 107/47 (!) 114/55 108/60  Pulse: 78 81 78 68  Resp: 18 18 18 17   Temp:  97.8 F (36.6 C) 98.4 F (36.9 C) 98.2 F (36.8 C)  TempSrc:  Oral    SpO2: 95% 95% 93% 92%  Weight:      Height:        Intake/Output Summary (Last 24 hours) at 04/19/2024 1429 Last data filed at 04/19/2024 0900 Gross per 24 hour  Intake 0 ml  Output 1025 ml  Net -1025 ml   Filed Weights   04/10/24 1006 04/10/24 1357  Weight: 92.4 kg 93.4 kg   Examination: General exam: Alert, awake, oriented x 3; in no major distress and not requiring oxygen supplementation. Respiratory system: Clear to auscultation. Respiratory effort normal.  Good saturation on room air. Cardiovascular system: Rate controlled, no rubs, no gallops, no JVD on exam. Gastrointestinal system: Abdomen is stable from distention standpoint; with positive bowel sounds and no guarding.  Slightly tense on palpation. Central nervous system: Moving 4 limbs spontaneously.  Generally weak.  No focal neurological deficits. Extremities: No cyanosis or clubbing; trace edema appreciated bilaterally. Skin: No petechiae. Psychiatry: Judgement and insight appear normal.  Flat affect on exam.  Data Reviewed: I have personally  reviewed following labs and imaging studies  CBC: Recent Labs  Lab 04/14/24 0525 04/15/24 0436 04/17/24 0500  WBC 4.9 4.0 7.0  HGB 9.3* 8.6* 8.2*  HCT 29.6* 27.0* 25.6*  MCV 85.3 84.9 85.6  PLT 172 158 144*    Basic Metabolic Panel: Recent Labs  Lab 04/16/24 0445 04/17/24 0504 04/17/24 1841 04/18/24 0510 04/18/24 1836 04/19/24 0456  NA 137 136 136 137 135 137  K 5.2* 5.3* 4.8 4.8 5.0 4.7  CL 104 100 100 98 96* 96*  CO2 18* 21* 18* 20* 24 22  GLUCOSE 91 121* 105* 111* 148* 133*  BUN 59* 70* 73* 76* 84* 84*  CREATININE 2.35* 3.01* 2.89* 3.06* 2.95* 2.83*  CALCIUM  8.2* 8.3* 8.2* 8.0* 7.7* 7.5*  PHOS 5.3* 6.4*  --  6.9*  --  7.2*    CBG: Recent Labs  Lab 04/18/24 1130 04/18/24 1644 04/18/24 2031 04/19/24 0731 04/19/24 1128  GLUCAP 113* 133* 162* 120* 138*    Recent Results (from the past 240 hours)  Gram stain     Status: None   Collection Time: 04/17/24  9:28 AM   Specimen: Ascitic  Result Value Ref Range Status   Specimen Description ASCITIC  Final   Special Requests ASCITIC  Final   Gram Stain   Final    WBC PRESENT,BOTH PMN AND MONONUCLEAR NO ORGANISMS SEEN CYTOSPIN SMEAR Performed at [facility], [address],  [place], [place] [address]    Report Status 04/17/2024 FINAL  Final  Culture, body fluid w Gram Stain-bottle     Status: Abnormal   Collection Time: 04/17/24  9:28 AM   Specimen: Ascitic  Result Value Ref Range Status   Specimen Description   Final    ASCITIC Performed at [facility], [address] [place], [place] [address]    Special Requests   Final    BOTTLES DRAWN AEROBIC AND ANAEROBIC Blood Culture adequate volume Performed at [facility], [address], [place], [place] [address]    Gram Stain   Final    IN BOTH AEROBIC AND ANAEROBIC BOTTLES GRAM NEGATIVE RODS Gram Stain Report Called to,Read Back By and Verified WithBETHA LEOTIS SLICKER @ 1913 ON 04/17/24 C VARNER Performed at [facility], [address], [place], [place]  [address]    Culture ESCHERICHIA COLI (A)  Final   Report Status 04/19/2024 FINAL  Final   Organism ID, Bacteria ESCHERICHIA COLI  Final      Susceptibility   Escherichia coli - MIC*    AMPICILLIN <=2 SENSITIVE Sensitive     CEFAZOLIN  (NON-URINE) <=1 SENSITIVE Sensitive     CEFEPIME <=0.12 SENSITIVE Sensitive     ERTAPENEM <=0.12 SENSITIVE Sensitive     CEFTRIAXONE <=0.25 SENSITIVE Sensitive     CIPROFLOXACIN  <=0.06 SENSITIVE Sensitive     GENTAMICIN <=1 SENSITIVE Sensitive     MEROPENEM <=0.25 SENSITIVE Sensitive     TRIMETH/SULFA <=20 SENSITIVE Sensitive     AMPICILLIN/SULBACTAM <=2 SENSITIVE Sensitive     PIP/TAZO Value in next row Sensitive      <=4 SENSITIVEThis is a modified FDA-approved test that has been validated and its performance characteristics determined by the reporting laboratory.  This laboratory is certified under the Clinical Laboratory Improvement Amendments CLIA as qualified to perform high complexity clinical laboratory testing.    * ESCHERICHIA COLI     Radiology Studies: US  CORE BIOPSY (LYMPH NODES) Result Date: 04/17/2024 INDICATION: Left groin lymphadenopathy. EXAM: ULTRASOUND GUIDED core needle  BIOPSY OF left inguinal region MEDICATIONS: None. ANESTHESIA/SEDATION: Fentanyl  1.5 mcg IV; Versed  50 mg IV Moderate Sedation Time:  10 The patient was continuously monitored during the procedure by the interventional radiology nurse under my direct supervision. PROCEDURE: The procedure, risks, benefits, and alternatives were explained to the patient. Questions regarding the procedure were encouraged and answered. The patient understands and consents to the procedure. The left groin was prepped with chlorhexidine  in a sterile fashion, and a sterile drape was applied covering the operative field. A sterile gown and sterile gloves were used for the procedure. Local anesthesia was provided with 1% Lidocaine . An introducer needle was advanced through a small incision made in the mid  aspect of the left groin. The needle was advanced under ultrasound guidance until the needle tip was engaged within the abnormally enlarged lymph node. The stylet was removed and the BioPince needle was then advanced through the introducer needle until the BioPince needle was engaged within the lymph node tissue. A 2.3 cm core sample was obtained. Sample was placed in saline. A second sample was obtained in similar fashion. All needles removed from the patient. Sterile dressing applied. Final image demonstrates no active hemorrhage. COMPLICATIONS: Satisfactory core needle biopsy of left inguinal region lymph node. Electronically Signed   By: Cordella Banner   On: 04/17/2024 15:24    Scheduled Meds:  aspirin  EC  81 mg Oral Daily   Chlorhexidine  Gluconate Cloth  6 each Topical  Daily   Chlorhexidine  Gluconate Cloth  6 each Topical Q0600   feeding supplement  1 Container Oral BID BM   fluticasone   2 spray Each Nare Daily   heparin   5,000 Units Subcutaneous Q8H   insulin  aspart  0-9 Units Subcutaneous TID WC   labetalol   200 mg Oral BID   loratadine   10 mg Oral Daily   megestrol   40 mg Oral Daily   melatonin  3 mg Oral QHS   multivitamin with minerals  1 tablet Oral Daily   pantoprazole   40 mg Oral QPM   predniSONE   60 mg Oral Q breakfast   simethicone   80 mg Oral QID   sodium zirconium cyclosilicate   10 g Oral BID   Continuous Infusions:  anticoagulant sodium citrate      ciprofloxacin  400 mg (04/19/24 0519)   sodium bicarbonate 150 mEq in sterile water  1,150 mL infusion 75 mL/hr at 04/19/24 0209     LOS: 9 days   Time spent: 50 mins  Eric Nunnery, MD How to contact the [facility] Attending or Consulting provider 7A - 7P or covering provider during after hours 7P -7A, for this patient?  Check the care team in [facility] and look for a) attending/consulting TRH provider listed and b) the TRH team listed Log into [email or web address] to find provider on call.  Locate the TRH provider you are looking for  under [facility] and page to a number that you can be directly reached. If you still have difficulty reaching the provider, please page the [facility] (Director on Call) for the Hospitalists listed on amion for assistance.  04/19/2024, 2:29 PM

## 2024-04-19 NOTE — Progress Notes (Signed)
 Patient ID: Cody Palmer, male   DOB: December 18, 1932, 89 y.o.   MRN: [id number]  Assessment/Plan:  AKI - likely ischemic ATN due to combination of volume depletion due to poor po intake along with diarrhea and concomitant ARB/HCT and SGLT2-inhibitor.  Continue to hold losartan  hct and jardiance .  Scr continues to climb despite being 4L positive during this hospitalization.  Continue with foley catheter placement given symptoms of retention and poor UOP documented.  Also on the differential diagnosis is abdominal compartment syndrome given tense abdominal distention.  Renal US  without obstruction.  Continue to follow UOP and Scr closely.  May need kidney biopsy if no improvement (could have involvement with lymphoma).  FeNa low consistent with pre-renal causes but .  Will see if they can use pressure sensor with foley to r/o abdominal compartment syndrome; messaged RN. Was concerned he was nearing need for RRT and uremic but decent response with Lasix .  Could also be tumor lysis syndrome with elevated uric acid and LDH, K, and phos.    Will hold off on initiating HD for now and canceled request for a catheter by VIR; he is not out of the woods and still very high risk for requiring dialysis early next week.  But fortunately decent response to IV Lasix , will continue Lasix  for now, monitor response and if needed request a tunneled catheter for dialysis early next week by general surgery.  Avoid nephrotoxic medications including NSAIDs and iodinated intravenous contrast exposure unless the latter is absolutely indicated.   Preferred narcotic agents for pain control are hydromorphone , fentanyl , and methadone. Morphine  should not be used.  Avoid Baclofen and avoid oral sodium phosphate  and magnesium  citrate based laxatives / bowel preps.  Continue strict Input and Output monitoring.  Will monitor the patient closely with you and intervene or adjust therapy as indicated by changes in clinical status/labs   Abdominal distention - worsening since admission per daughter.  Enlarging mesenteric mass concerning for lymphoma.  Repeat CT scan with po contrast without evidence of SBO.  GI following.  Was to have PET scan on 04/18/24 and lymph node biopsy.  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   Not sure he would need lymph node biopsy to make diagnosis given path report.  Appreciate Heme/Onc consult and CT guided biopsy at [facility].  Hopefully can start treatment while an inpatient. Hyperkalemia - isotonic bicarb due to AGMA and Lokelma  10 gm daily but will initiate dialysis once dialysis catheter is in. Increasing ascites - now moderate ascites.  Appreciate IR assistance for US  guided paracentesis of 2.2 liters of bloody fluid for patient comfort but rapidly reaccumulating. Abnormal LFT's - unclear etiology as CT of abd and pelvis without cirrhosis or suspicious focal abnormality.  Avoid hepatotoxic agents.  Improving. Anemia - ESA on hold given likely malignancy.  Oncology following.   Elevated LDH also concerning.    S: Cody Palmer had US -guided paracentesis on Wed with 2.2 liters but rapidly accumulating; pt also has very poor appetitie with funny taste in his mouth. He has to force himself to eat. 1L UOP /24hrs and he thinks he is feeling a little better.  Family including daughter at bedside updated  O:BP (!) 114/55 (BP Location: Left Arm)   Pulse 78   Temp 98.4 F (36.9 C)   Resp 18   Ht 5' 9 (1.753 m)   Wt 93.4 kg  SpO2 93%   BMI 30.42 kg/m   Intake/Output Summary (Last 24 hours) at 04/19/2024 0849 Last data filed at 04/19/2024 0500 Gross per 24 hour  Intake --  Output 1025 ml  Net -1025 ml   Intake/Output: I/O last 3 completed shifts: In: 1015.2 [I.V.:1015.2] Out: 1325 [Urine:1325]  Intake/Output this shift:  No intake/output data recorded. Weight change:  Gen:  mild distress with tachypnea and abdominal distention. CVS: RRR Resp: decreased BS at bases + coarse rales Abd: distended, tense, +BS, mildly tender to palpation Ext: 1+ pretibial edema  Recent Labs  Lab 04/15/24 0436 04/16/24 0445 04/17/24 0500 04/17/24 0504 04/17/24 1841 04/18/24 0510 04/18/24 1836 04/19/24 0456  NA 138 137  --  136 136 137 135 137  K 4.7 5.2*  --  5.3* 4.8 4.8 5.0 4.7  CL 106 104  --  100 100 98 96* 96*  CO2 19* 18*  --  21* 18* 20* 24 22  GLUCOSE 96 91  --  121* 105* 111* 148* 133*  BUN 51* 59*  --  70* 73* 76* 84* 84*  CREATININE 1.90* 2.35*  --  3.01* 2.89* 3.06* 2.95* 2.83*  ALBUMIN   --  3.2*  3.2* 3.5 3.3* 3.2* 3.1* 3.0* 3.0*  CALCIUM  8.2* 8.2*  --  8.3* 8.2* 8.0* 7.7* 7.5*  PHOS  --  5.3*  --  6.4*  --  6.9*  --  7.2*  AST  --  61* 53*  --  46* 42* 37 36  ALT  --  35 33  --  28 25 22 21    Liver Function Tests: Recent Labs  Lab 04/18/24 0510 04/18/24 1836 04/19/24 0456  AST 42* 37 36  ALT 25 22 21   ALKPHOS 74 74 69  BILITOT 0.5 0.4 0.4  PROT 5.3* 5.1* 5.1*  ALBUMIN  3.1* 3.0* 3.0*   No results for input(s): LIPASE, AMYLASE in the last 168 hours. No results for input(s): AMMONIA in the last 168 hours. CBC: Recent Labs  Lab 04/14/24 0525 04/15/24 0436 04/17/24 0500  WBC 4.9 4.0 7.0  HGB 9.3* 8.6* 8.2*  HCT 29.6* 27.0* 25.6*  MCV 85.3 84.9 85.6  PLT 172 158 144*   Cardiac Enzymes: No results for input(s): CKTOTAL, CKMB, CKMBINDEX, TROPONINI in the last 168 hours. CBG: Recent Labs  Lab 04/18/24 0729 04/18/24 1130 04/18/24 1644 04/18/24 2031 04/19/24 0731  GLUCAP 125* 113* 133* 162* 120*    Iron Studies: No results for input(s): IRON, TIBC, TRANSFERRIN, FERRITIN in the last 72 hours. Studies/Results: US  CORE BIOPSY (LYMPH NODES) Result Date: 04/17/2024 INDICATION: Left groin lymphadenopathy. EXAM: ULTRASOUND GUIDED core needle  BIOPSY OF left inguinal region MEDICATIONS: None. ANESTHESIA/SEDATION:  Fentanyl  1.5 mcg IV; Versed  50 mg IV Moderate Sedation Time:  10 The patient was continuously monitored during the procedure by the interventional radiology nurse under my direct supervision. PROCEDURE: The procedure, risks, benefits, and alternatives were explained to the patient. Questions regarding the procedure were encouraged and answered. The patient understands and consents to the procedure. The left groin was prepped with chlorhexidine  in a sterile fashion, and a sterile drape was applied covering the operative field. A sterile gown and sterile gloves were used for the procedure. Local anesthesia was provided with 1% Lidocaine . An introducer needle was advanced through a small incision made in the mid aspect of the left groin. The needle was advanced under ultrasound guidance until the needle tip was engaged within the abnormally enlarged lymph node. The stylet was removed and  the BioPince needle was then advanced through the introducer needle until the BioPince needle was engaged within the lymph node tissue. A 2.3 cm core sample was obtained. Sample was placed in saline. A second sample was obtained in similar fashion. All needles removed from the patient. Sterile dressing applied. Final image demonstrates no active hemorrhage. COMPLICATIONS: Satisfactory core needle biopsy of left inguinal region lymph node. Electronically Signed   By: Cordella Banner   On: 04/17/2024 15:24   US  Paracentesis Result Date: 04/17/2024 INDICATION: Patient with enlarging mesenteric mass concerning for lymphoma, new onset ascites. Request for diagnostic and therapeutic paracentesis. EXAM: ULTRASOUND GUIDED DIAGNOSTIC AND THERAPEUTIC PARACENTESIS MEDICATIONS: 6 mL 1% lidocaine  COMPLICATIONS: None immediate. PROCEDURE: Informed written consent was obtained from the patient after a discussion of the risks, benefits and alternatives to treatment. A timeout was performed prior to the initiation of the procedure. Initial  ultrasound scanning demonstrates a moderate amount of ascites within the right lower abdominal quadrant. The right lower abdomen was prepped and draped in the usual sterile fashion. 1% lidocaine  was used for local anesthesia. Following this, a 19 gauge, 7-cm, Yueh catheter was introduced. An ultrasound image was saved for documentation purposes. The paracentesis was performed. The catheter was removed and a dressing was applied. The patient tolerated the procedure well without immediate post procedural complication. FINDINGS: A total of approximately 2.2 L of bloody fluid was removed. Samples were sent to the laboratory as requested by the clinical team. IMPRESSION: Successful ultrasound-guided paracentesis yielding 2.2 liters of peritoneal fluid. Performed by Clotilda Hesselbach, PA-C Electronically Signed   By: CHRISTELLA.  Shick M.D.   On: 04/17/2024 11:48    aspirin  EC  81 mg Oral Daily   Chlorhexidine  Gluconate Cloth  6 each Topical Daily   Chlorhexidine  Gluconate Cloth  6 each Topical Q0600   feeding supplement  1 Container Oral BID BM   fluticasone   2 spray Each Nare Daily   heparin   5,000 Units Subcutaneous Q8H   insulin  aspart  0-9 Units Subcutaneous TID WC   labetalol   200 mg Oral BID   loratadine   10 mg Oral Daily   melatonin  3 mg Oral QHS   multivitamin with minerals  1 tablet Oral Daily   pantoprazole   40 mg Oral QPM   predniSONE   60 mg Oral Q breakfast   simethicone   80 mg Oral QID   sodium zirconium cyclosilicate   10 g Oral BID    BMET    Component Value Date/Time   NA 137 04/19/2024 0456   NA 139 03/27/2024 1513   K 4.7 04/19/2024 0456   CL 96 (L) 04/19/2024 0456   CO2 22 04/19/2024 0456   GLUCOSE 133 (H) 04/19/2024 0456   BUN 84 (H) 04/19/2024 0456   BUN 32 (H) 03/27/2024 1513   CREATININE 2.83 (H) 04/19/2024 0456   CALCIUM  7.5 (L) 04/19/2024 0456   GFRNONAA 21 (L) 04/19/2024 0456   GFRAA >60 04/19/2016 1459   CBC    Component Value Date/Time   WBC 7.0 04/17/2024 0500    RBC 2.99 (L) 04/17/2024 0500   HGB 8.2 (L) 04/17/2024 0500   HGB 12.7 (L) 03/27/2024 1513   HCT 25.6 (L) 04/17/2024 0500   HCT 38.9 03/27/2024 1513   PLT 144 (L) 04/17/2024 0500   PLT 126 (L) 03/27/2024 1513   MCV 85.6 04/17/2024 0500   MCV 85 03/27/2024 1513   MCH 27.4 04/17/2024 0500   MCHC 32.0 04/17/2024 0500   RDW 18.2 (H)  04/17/2024 0500   RDW 14.9 03/27/2024 1513   LYMPHSABS 1.4 04/10/2024 1046   LYMPHSABS 3.2 (H) 03/27/2024 1513   MONOABS 0.5 04/10/2024 1046   EOSABS 0.1 04/10/2024 1046   EOSABS 0.1 03/27/2024 1513   BASOSABS 0.0 04/10/2024 1046   BASOSABS 0.1 03/27/2024 1513

## 2024-04-19 NOTE — Progress Notes (Signed)
 Mobility Specialist Progress Note:    04/19/24 1215  Mobility  Activity Ambulated with assistance  Level of Assistance Contact guard assist, steadying assist  Assistive Device Other (Comment) (uses surroundings)  Distance Ambulated (ft) 10 ft  Range of Motion/Exercises Active;All extremities  Activity Response Tolerated well  Mobility Referral Yes  Mobility visit 1 Mobility  Mobility Specialist Start Time (ACUTE ONLY) 1215  Mobility Specialist Stop Time (ACUTE ONLY) 1231  Mobility Specialist Time Calculation (min) (ACUTE ONLY) 16 min   Pt received in bathroom, requesting assistance with peri care and getting back to bed.  Required CGA to stand and ambulate using his surroundings for support. Family in room, all needs met.  Lisbet Busker Mobility Specialist Please contact via Special Educational Needs Teacher or  Rehab office at [phone]

## 2024-04-20 DIAGNOSIS — N179 Acute kidney failure, unspecified: Secondary | ICD-10-CM | POA: Diagnosis not present

## 2024-04-20 LAB — COMPREHENSIVE METABOLIC PANEL WITH GFR
ALT: 17 U/L (ref 0–44)
ALT: 18 U/L (ref 0–44)
AST: 33 U/L (ref 15–41)
AST: 35 U/L (ref 15–41)
Albumin: 3.1 g/dL — ABNORMAL LOW (ref 3.5–5.0)
Albumin: 3.1 g/dL — ABNORMAL LOW (ref 3.5–5.0)
Alkaline Phosphatase: 70 U/L (ref 38–126)
Alkaline Phosphatase: 73 U/L (ref 38–126)
Anion gap: 8 (ref 5–15)
Anion gap: 16 — ABNORMAL HIGH (ref 5–15)
BUN: 84 mg/dL — ABNORMAL HIGH (ref 8–23)
BUN: 82 mg/dL — ABNORMAL HIGH (ref 8–23)
CO2: 36 mmol/L — ABNORMAL HIGH (ref 22–32)
CO2: 29 mmol/L (ref 22–32)
Calcium: 7.7 mg/dL — ABNORMAL LOW (ref 8.9–10.3)
Calcium: 7.4 mg/dL — ABNORMAL LOW (ref 8.9–10.3)
Chloride: 96 mmol/L — ABNORMAL LOW (ref 98–111)
Chloride: 94 mmol/L — ABNORMAL LOW (ref 98–111)
Creatinine, Ser: 2.39 mg/dL — ABNORMAL HIGH (ref 0.61–1.24)
Creatinine, Ser: 2.31 mg/dL — ABNORMAL HIGH (ref 0.61–1.24)
GFR, Estimated: 25 mL/min — ABNORMAL LOW (ref >60)
GFR, Estimated: 26 mL/min — ABNORMAL LOW (ref >60)
Glucose, Bld: 108 mg/dL — ABNORMAL HIGH (ref 70–99)
Glucose, Bld: 173 mg/dL — ABNORMAL HIGH (ref 70–99)
Potassium: 4.1 mmol/L (ref 3.5–5.1)
Potassium: 3.8 mmol/L (ref 3.5–5.1)
Sodium: 140 mmol/L (ref 135–145)
Sodium: 139 mmol/L (ref 135–145)
Total Bilirubin: 0.3 mg/dL (ref 0.0–1.2)
Total Bilirubin: 0.3 mg/dL (ref 0.0–1.2)
Total Protein: 5.2 g/dL — ABNORMAL LOW (ref 6.5–8.1)
Total Protein: 5.2 g/dL — ABNORMAL LOW (ref 6.5–8.1)

## 2024-04-20 LAB — PHOSPHORUS: Phosphorus: 5.7 mg/dL — ABNORMAL HIGH (ref 2.5–4.6)

## 2024-04-20 LAB — LACTATE DEHYDROGENASE
LDH: 1038 U/L — ABNORMAL HIGH (ref 105–235)
LDH: 982 U/L — ABNORMAL HIGH (ref 105–235)

## 2024-04-20 LAB — GLUCOSE, CAPILLARY
Glucose-Capillary: 112 mg/dL — ABNORMAL HIGH (ref 70–99)
Glucose-Capillary: 129 mg/dL — ABNORMAL HIGH (ref 70–99)
Glucose-Capillary: 164 mg/dL — ABNORMAL HIGH (ref 70–99)
Glucose-Capillary: 163 mg/dL — ABNORMAL HIGH (ref 70–99)

## 2024-04-20 MED ORDER — ORAL CARE MOUTH RINSE: 15.0000 mL | OROMUCOSAL | Status: DC | PRN

## 2024-04-20 MED ORDER — LABETALOL HCL 100 MG PO TABS
100.0000 mg | ORAL_TABLET | Freq: Two times a day (BID) | ORAL | Status: DC
  Administered 2024-04-20 – 2024-04-24 (×8): 100 mg via ORAL
  Filled 2024-04-20 (×8): qty 1

## 2024-04-20 MED ORDER — ALLOPURINOL 300 MG PO TABS
150.0000 mg | ORAL_TABLET | Freq: Every day | ORAL | Status: DC
  Administered 2024-04-20 – 2024-04-24 (×5): 150 mg via ORAL
  Filled 2024-04-20 (×5): qty 1

## 2024-04-20 MED ORDER — FUROSEMIDE 10 MG/ML IJ SOLN
40.0000 mg | Freq: Every day | INTRAMUSCULAR | Status: DC
  Administered 2024-04-20 – 2024-04-24 (×5): 40 mg via INTRAVENOUS
  Filled 2024-04-20 (×5): qty 4

## 2024-04-20 NOTE — Plan of Care (Signed)

## 2024-04-20 NOTE — Progress Notes (Signed)
 PROGRESS NOTE   Cody Palmer  FMW:[id number] DOB: July 22, 1932 DOA: 04/10/2024 PCP: [name], NP   Chief Complaint  Patient presents with   Weakness   Level of care: Med-Surg  Brief Admission History:  Cody Palmer is a 89 year old male with stage IIIa CKD, hypertension, hyperlipidemia, type 2 diabetes mellitus, controlled, recently hospitalized for abdominal distention and concern for colitis versus lymphoma.  He was discharged 4 days ago to home.  He returns today due to poor oral intake, almost no appetite, poor fluid consumption and failure to thrive.  He has become increasingly more weak.  He was seen by GI and had colonoscopy on 04/05/2024 with findings of polyps but otherwise pretty normal.  He also had been seen by oncology during last hospital stay and arrangements made for outpatient PET scan on 04/18/2024 and then will proceed with a lymph node biopsy with IR.  Patient has been having nausea and taking Zofran , abdominal distention persist, poor oral intake persists.  He has been having loose diarrheal stools.  Today he is noted to have another AKI and started on IV fluid hydration.  Admission was requested for further management.    Assessment and Plan:  AKI on CKD stage 3a/hyperkalemia/lymphoma. -- due to poor oral intake and diarrhea -- initially started to improve but now trending back up --CT scan of the abdomen has been ordered as recommended by nephrology. -- Continue to follow BMP as creatinine trends down -- Continue avoiding nephrotoxins -Lokelma  given And patient's electrolytes improved.  (Potassium 4.1). - Creatinine has worsened up to 2.39; patient's GFR 25. -Patient BUN 84. -Continue to follow nephrology service recommendation. - Continue to maintain adequate hydration; continue the use of Lasix  as per nephrology service recommendation.  Nephrology service will hold off on placing dialysis catheter at the moment as patient renal function has  improved and so far electrolytes are stable. -No acute indication for dialysis appreciated. -Emergent to continue assisting possible TLS will continue the use of allopurinol .   Essential hypertension  -- stable, resume home meds as able -- resume home labetalol  at reduced dose of 100 mg BID with hold parameters. -Continue to hold the rest of his antihypertensive agents. -Follow vital signs.   Controlled type 2 DM  -- sensitive SSI coverage ordered  CBG (last 3)  Recent Labs    04/20/24 0722 04/20/24 1122 04/20/24 1602  GLUCAP 112* 129* 164*   Generalized Weakness -- secondary to metabolic derangements  -- encourage ambulation and physical interaction  --might need significant assistance at discharge   Adult failure to thrive -- he remains weak, failed recent discharge home -- Appreciate assistance and recommendation by dietitian regarding feeding supplements. -- hopefully he improves with supportive measures --Follow recommendations by oncology service Megace  has been started. -- Continue to follow clinical response   Abdominal distension Question of abdominal lymphoma -- ongoing outpatient work up  -- he is to have a PET scan outpatient on 12/4 -- Oncology service has been consulted. -- he is to have IR lymph node biopsy after PET scan - acute abdomen series demonstrating not SBO - CT scan demonstrated incisional loop dilations without SBO; moderate ascites. -S/P paracentesis (2.2L removed) on 04/17/2024 -IV albumin  has been ordered. - Core biopsy has been been performed without complications - Continue to follow uric acid, LDH and complete metabolic panel. - Per oncology service rasburicase  x 1 given on 04/17/2024 and patient has been started on prednisone  60 mg twice a day. - Follow  biopsy results and initiate further management for presumed lymphoma as per oncology recommendations.  Gram negative rods peritonitis  - Patient's ascitic fluid examination demonstrating  the presence of gram-negative rods in his aerobic and anaerobic cultures. - Ciprofloxacin  daily (dose adjusted for renal function) has been started - Patient is afebrile; but since initiation of his steroids and increased risk for systemic infection we will protect with antibiotics empirically. - Adequate antibiotic per sensitivity results.   DVT prophylaxis: heparin  Code Status: DNR  Family Communication: daughter bedside updated Disposition: anticipate home when stabilized    Consultants:  Nephrology service, oncology service, interventional radiology and gastroenterology.  Procedures:  See below for x-ray reports.  Antimicrobials:  Ciprofloxacin .   Subjective: Afebrile, no chest pain, no nausea, no vomiting.  Night events reported.  Objective: Vitals:   04/19/24 1410 04/19/24 1952 04/20/24 0223 04/20/24 1529  BP: 108/60 (!) 110/51 (!) 110/57 (!) 98/43  Pulse: 68 73 77 98  Resp: 17 18 18 18   Temp: 98.2 F (36.8 C) 98.3 F (36.8 C) 97.8 F (36.6 C) 98 F (36.7 C)  TempSrc:  Oral Oral Oral  SpO2: 92% 90% 90% 91%  Weight:      Height:        Intake/Output Summary (Last 24 hours) at 04/20/2024 1757 Last data filed at 04/20/2024 1704 Gross per 24 hour  Intake 1617.35 ml  Output 2250 ml  Net -632.65 ml   Filed Weights   04/10/24 1006 04/10/24 1357  Weight: 92.4 kg 93.4 kg   Examination: General exam: Afebrile; reports feeling slightly better.  Patient had 1 reported bowel movement. Respiratory system: Good saturation on room air; no using accessory muscle. Cardiovascular system: Rate controlled, no rubs, no gallops, no JVD on exam.  Trace to 1+ edema appreciated bilaterally. Gastrointestinal system: Abdomen is stable, tense, without guarding and with positive bowel sounds.  Distention unchanged from examination on 04/19/2024. Central nervous system: Moving 4 limbs spontaneously.  No focal neurological deficits. Extremities: No cyanosis or clubbing. Skin: No  petechiae. Psychiatry: Judgement and insight appear normal.  Flat affect appreciated on exam.  Sight appear normal.  Flat affect on exam.  Data Reviewed: I have personally reviewed following labs and imaging studies  CBC: Recent Labs  Lab 04/14/24 0525 04/15/24 0436 04/17/24 0500  WBC 4.9 4.0 7.0  HGB 9.3* 8.6* 8.2*  HCT 29.6* 27.0* 25.6*  MCV 85.3 84.9 85.6  PLT 172 158 144*    Basic Metabolic Panel: Recent Labs  Lab 04/16/24 0445 04/17/24 0504 04/17/24 1841 04/18/24 0510 04/18/24 1836 04/19/24 0456 04/19/24 1759 04/20/24 0800  NA 137 136   < > 137 135 137 138 140  K 5.2* 5.3*   < > 4.8 5.0 4.7 4.4 4.1  CL 104 100   < > 98 96* 96* 96* 96*  CO2 18* 21*   < > 20* 24 22 25  36*  GLUCOSE 91 121*   < > 111* 148* 133* 173* 108*  BUN 59* 70*   < > 76* 84* 84* 84* 84*  CREATININE 2.35* 3.01*   < > 3.06* 2.95* 2.83* 2.62* 2.39*  CALCIUM  8.2* 8.3*   < > 8.0* 7.7* 7.5* 7.5* 7.7*  PHOS 5.3* 6.4*  --  6.9*  --  7.2*  --  5.7*   < > = values in this interval not displayed.    CBG: Recent Labs  Lab 04/19/24 1605 04/19/24 1956 04/20/24 0722 04/20/24 1122 04/20/24 1602  GLUCAP 177* 151* 112* 129* 164*  Recent Results (from the past 240 hours)  Gram stain     Status: None   Collection Time: 04/17/24  9:28 AM   Specimen: Ascitic  Result Value Ref Range Status   Specimen Description ASCITIC  Final   Special Requests ASCITIC  Final   Gram Stain   Final    WBC PRESENT,BOTH PMN AND MONONUCLEAR NO ORGANISMS SEEN CYTOSPIN SMEAR Performed at [facility], [address], [place], [place] [address]    Report Status 04/17/2024 FINAL  Final  Culture, body fluid w Gram Stain-bottle     Status: Abnormal   Collection Time: 04/17/24  9:28 AM   Specimen: Ascitic  Result Value Ref Range Status   Specimen Description   Final    ASCITIC Performed at [facility], [address] [place], [place] [address]    Special Requests   Final    BOTTLES DRAWN AEROBIC AND ANAEROBIC Blood  Culture adequate volume Performed at [facility], [address] [place], [place] [address]    Gram Stain   Final    IN BOTH AEROBIC AND ANAEROBIC BOTTLES GRAM NEGATIVE RODS Gram Stain Report Called to,Read Back By and Verified WithBETHA LEOTIS SLICKER @ 1913 ON 04/17/24 C VARNER Performed at [facility], [address], [place], [place] [address]    Culture ESCHERICHIA COLI (A)  Final   Report Status 04/19/2024 FINAL  Final   Organism ID, Bacteria ESCHERICHIA COLI  Final      Susceptibility   Escherichia coli - MIC*    AMPICILLIN <=2 SENSITIVE Sensitive     CEFAZOLIN  (NON-URINE) <=1 SENSITIVE Sensitive     CEFEPIME <=0.12 SENSITIVE Sensitive     ERTAPENEM <=0.12 SENSITIVE Sensitive     CEFTRIAXONE <=0.25 SENSITIVE Sensitive     CIPROFLOXACIN  <=0.06 SENSITIVE Sensitive     GENTAMICIN <=1 SENSITIVE Sensitive     MEROPENEM <=0.25 SENSITIVE Sensitive     TRIMETH/SULFA <=20 SENSITIVE Sensitive     AMPICILLIN/SULBACTAM <=2 SENSITIVE Sensitive     PIP/TAZO Value in next row Sensitive      <=4 SENSITIVEThis is a modified FDA-approved test that has been validated and its performance characteristics determined by the reporting laboratory.  This laboratory is certified under the Clinical Laboratory Improvement Amendments CLIA as qualified to perform high complexity clinical laboratory testing.    * ESCHERICHIA COLI     Radiology Studies: No results found.   Scheduled Meds:  allopurinol   150 mg Oral Daily   aspirin  EC  81 mg Oral Daily   Chlorhexidine  Gluconate Cloth  6 each Topical Daily   Chlorhexidine  Gluconate Cloth  6 each Topical Q0600   feeding supplement  1 Container Oral BID BM   fluticasone   2 spray Each Nare Daily   furosemide   40 mg Intravenous Daily   heparin   5,000 Units Subcutaneous Q8H   insulin  aspart  0-9 Units Subcutaneous TID WC   labetalol   100 mg Oral BID   loratadine   10 mg Oral Daily   megestrol   40 mg Oral Daily   melatonin  3 mg Oral QHS   multivitamin with  minerals  1 tablet Oral Daily   pantoprazole   40 mg Oral QPM   predniSONE   60 mg Oral Q breakfast   simethicone   80 mg Oral QID   sodium zirconium cyclosilicate   10 g Oral BID   Continuous Infusions:  anticoagulant sodium citrate      ciprofloxacin  Stopped (04/20/24 0616)   sodium bicarbonate 150 mEq in sterile water  1,150  mL infusion 75 mL/hr at 04/20/24 0841     LOS: 10 days   Time spent: 50 mins  Eric Nunnery, MD How to contact the [place] Attending or Consulting provider 7A - 7P or covering provider during after hours 7P -7A, for this patient?  Check the care team in [facility] and look for a) attending/consulting [facility] provider listed and b) the TRH team listed Log into [email or web address] to find provider on call.  Locate the [facility] provider you are looking for under [facility] and page to a number that you can be directly reached. If you still have difficulty reaching the provider, please page the [facility] (Director on Call) for the Hospitalists listed on amion for assistance.  04/20/2024, 5:57 PM

## 2024-04-21 DIAGNOSIS — R531 Weakness: Secondary | ICD-10-CM | POA: Diagnosis not present

## 2024-04-21 DIAGNOSIS — Z66 Do not resuscitate: Secondary | ICD-10-CM | POA: Diagnosis not present

## 2024-04-21 DIAGNOSIS — R627 Adult failure to thrive: Secondary | ICD-10-CM | POA: Diagnosis not present

## 2024-04-21 DIAGNOSIS — N179 Acute kidney failure, unspecified: Secondary | ICD-10-CM | POA: Diagnosis not present

## 2024-04-21 LAB — COMPREHENSIVE METABOLIC PANEL WITH GFR
ALT: 17 U/L (ref 0–44)
AST: 34 U/L (ref 15–41)
Albumin: 3 g/dL — ABNORMAL LOW (ref 3.5–5.0)
Alkaline Phosphatase: 66 U/L (ref 38–126)
Anion gap: 17 — ABNORMAL HIGH (ref 5–15)
BUN: 81 mg/dL — ABNORMAL HIGH (ref 8–23)
CO2: 30 mmol/L (ref 22–32)
Calcium: 7.5 mg/dL — ABNORMAL LOW (ref 8.9–10.3)
Chloride: 94 mmol/L — ABNORMAL LOW (ref 98–111)
Creatinine, Ser: 2.08 mg/dL — ABNORMAL HIGH (ref 0.61–1.24)
GFR, Estimated: 30 mL/min — ABNORMAL LOW (ref >60)
Glucose, Bld: 112 mg/dL — ABNORMAL HIGH (ref 70–99)
Potassium: 3.5 mmol/L (ref 3.5–5.1)
Sodium: 140 mmol/L (ref 135–145)
Total Bilirubin: 0.3 mg/dL (ref 0.0–1.2)
Total Protein: 5 g/dL — ABNORMAL LOW (ref 6.5–8.1)

## 2024-04-21 LAB — PHOSPHORUS: Phosphorus: 5.9 mg/dL — ABNORMAL HIGH (ref 2.5–4.6)

## 2024-04-21 LAB — LACTATE DEHYDROGENASE: LDH: 957 U/L — ABNORMAL HIGH (ref 105–235)

## 2024-04-21 LAB — CBC
HCT: 24.6 % — ABNORMAL LOW (ref 39.0–52.0)
Hemoglobin: 7.6 g/dL — ABNORMAL LOW (ref 13.0–17.0)
MCH: 27.1 pg (ref 26.0–34.0)
MCHC: 30.9 g/dL (ref 30.0–36.0)
MCV: 87.9 fL (ref 80.0–100.0)
Platelets: 190 K/uL (ref 150–400)
RBC: 2.8 MIL/uL — ABNORMAL LOW (ref 4.22–5.81)
RDW: 18 % — ABNORMAL HIGH (ref 11.5–15.5)
WBC: 5.8 K/uL (ref 4.0–10.5)
nRBC: 0 % (ref 0.0–0.2)

## 2024-04-21 LAB — URIC ACID: Uric Acid, Serum: 7.4 mg/dL (ref 3.7–8.6)

## 2024-04-21 LAB — GLUCOSE, CAPILLARY
Glucose-Capillary: 109 mg/dL — ABNORMAL HIGH (ref 70–99)
Glucose-Capillary: 129 mg/dL — ABNORMAL HIGH (ref 70–99)
Glucose-Capillary: 174 mg/dL — ABNORMAL HIGH (ref 70–99)
Glucose-Capillary: 149 mg/dL — ABNORMAL HIGH (ref 70–99)

## 2024-04-21 NOTE — Plan of Care (Signed)

## 2024-04-21 NOTE — Progress Notes (Signed)
 PROGRESS NOTE   DAVID RODRIQUEZ  FMW:[id number] DOB: 07/29/1932 DOA: 04/10/2024 PCP: [name], NP   Chief Complaint  Patient presents with   Weakness   Level of care: Med-Surg  Brief Admission History:  NDREW CREASON is a 89 year old male with stage IIIa CKD, hypertension, hyperlipidemia, type 2 diabetes mellitus, controlled, recently hospitalized for abdominal distention and concern for colitis versus lymphoma.  He was discharged 4 days ago to home.  He returns today due to poor oral intake, almost no appetite, poor fluid consumption and failure to thrive.  He has become increasingly more weak.  He was seen by GI and had colonoscopy on 04/05/2024 with findings of polyps but otherwise pretty normal.  He also had been seen by oncology during last hospital stay and arrangements made for outpatient PET scan on 04/18/2024 and then will proceed with a lymph node biopsy with IR.  Patient has been having nausea and taking Zofran , abdominal distention persist, poor oral intake persists.  He has been having loose diarrheal stools.  Today he is noted to have another AKI and started on IV fluid hydration.  Admission was requested for further management.    Assessment and Plan:  AKI on CKD stage 3a/hyperkalemia/lymphoma. -- due to poor oral intake and diarrhea -- initially started to improve but now trending back up --CT scan of the abdomen has been ordered as recommended by nephrology. -- Continue to follow BMP as creatinine trends down -- Continue avoiding nephrotoxins - Patient's potassium level down to 3.5.  Lokelma  has been discontinued for now. - Creatinine has worsened up to 2.08; patient's GFR 30. -Patient BUN 84. -Continue to follow nephrology service recommendation. - Continue to maintain adequate hydration; continue the use of Lasix  as per nephrology service recommendation.  Nephrology service will hold off on placing dialysis catheter at the moment as patient renal  function has improved and so far electrolytes are stable. -No acute indication for dialysis appreciated. -Emergent to continue assisting possible TLS will continue the use of allopurinol .   Essential hypertension  -- stable, resume home meds as able -- resume home labetalol  at reduced dose of 100 mg BID with hold parameters. -Continue to hold the rest of his antihypertensive agents. -Follow vital signs.   Controlled type 2 DM  -- sensitive SSI coverage ordered  CBG (last 3)  Recent Labs    04/20/24 2027 04/21/24 0727 04/21/24 1137  GLUCAP 163* 109* 129*   Generalized Weakness -- secondary to metabolic derangements  -- encourage ambulation and physical interaction  --might need significant assistance at discharge   Adult failure to thrive -- he remains weak, failed recent discharge home -- Appreciate assistance and recommendation by dietitian regarding feeding supplements. -- hopefully he improves with supportive measures --Follow recommendations by oncology service Megace  has been started. -- Continue to follow clinical response   Abdominal distension Question of abdominal lymphoma -- ongoing outpatient work up  -- he is to have a PET scan outpatient on 12/4 -- Oncology service has been consulted. -- he is to have IR lymph node biopsy after PET scan - acute abdomen series demonstrating not SBO - CT scan demonstrated incisional loop dilations without SBO; moderate ascites. -S/P paracentesis (2.2L removed) on 04/17/2024 -IV albumin  has been ordered. - Core biopsy has been been performed without complications - Continue to follow uric acid, LDH and complete metabolic panel. - Per oncology service rasburicase  x 1 given on 04/17/2024 and patient has been started on prednisone  60 mg  twice a day. 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 - Continue the use of simethicone .  Gram negative rods peritonitis  -  Patient's ascitic fluid examination demonstrating the presence of gram-negative rods in his aerobic and anaerobic cultures. - Ciprofloxacin  daily (dose adjusted for renal function) has been started - Patient is afebrile; but since initiation of his steroids and increased risk for systemic infection we will protect with antibiotics empirically. - Adequate antibiotic per sensitivity results.   DVT prophylaxis: heparin  Code Status: DNR  Family Communication: daughter bedside updated Disposition: anticipate home when stabilized    Consultants:  Nephrology service, oncology service, interventional radiology and gastroenterology.  Procedures:  See below for x-ray reports.  Antimicrobials:  Ciprofloxacin .   Subjective: No fever, no chest pain, no nausea, no vomiting.  Reported intermittent abdominal discomfort and complains of obstipation.  Objective: Vitals:   04/20/24 1529 04/20/24 1951 04/21/24 0406 04/21/24 1332  BP: (!) 98/43 (!) 109/47 (!) 112/50 (!) 111/55  Pulse: 98 67 71 71  Resp: 18 17 20 16   Temp: 98 F (36.7 C) (!) 97.5 F (36.4 C) (!) 97.5 F (36.4 C) 97.9 F (36.6 C)  TempSrc: Oral Oral Oral Oral  SpO2: 91% 92% 92% 93%  Weight:      Height:        Intake/Output Summary (Last 24 hours) at 04/21/2024 1535 Last data filed at 04/21/2024 1332 Gross per 24 hour  Intake 2550.05 ml  Output 1300 ml  Net 1250.05 ml   Filed Weights   04/10/24 1006 04/10/24 1357  Weight: 92.4 kg 93.4 kg   Examination: General exam: Alert, awake, oriented x 3; reporting some intermittent abdominal discomfort and obstipation. Respiratory system: Good saturation on room air.  No using accessory muscle. Cardiovascular system:RRR. No murmurs, rubs, gallops. Gastrointestinal system: Abdomen is distended (but is stable); slightly tender to palpation (generalized) per patient reports feels slightly gas but need to come out.  Positive bowel sounds on exam. Central nervous system: No focal  neurological deficits. Extremities: No cyanosis or clubbing; 1+ edema appreciated bilaterally. Skin: No petechiae. Psychiatry: Judgement and insight appear normal.  Flat affect.  Data Reviewed: I have personally reviewed following labs and imaging studies  CBC: Recent Labs  Lab 04/15/24 0436 04/17/24 0500 04/21/24 0438  WBC 4.0 7.0 5.8  HGB 8.6* 8.2* 7.6*  HCT 27.0* 25.6* 24.6*  MCV 84.9 85.6 87.9  PLT 158 144* 190    Basic Metabolic Panel: Recent Labs  Lab 04/17/24 0504 04/17/24 1841 04/18/24 0510 04/18/24 1836 04/19/24 0456 04/19/24 1759 04/20/24 0800 04/20/24 1842 04/21/24 0438  NA 136   < > 137   < > 137 138 140 139 140  K 5.3*   < > 4.8   < > 4.7 4.4 4.1 3.8 3.5  CL 100   < > 98   < > 96* 96* 96* 94* 94*  CO2 21*   < > 20*   < > 22 25 36* 29 30  GLUCOSE 121*   < > 111*   < > 133* 173* 108* 173* 112*  BUN 70*   < > 76*   < > 84* 84* 84* 82* 81*  CREATININE 3.01*   < > 3.06*   < > 2.83* 2.62* 2.39* 2.31* 2.08*  CALCIUM  8.3*   < > 8.0*   < > 7.5* 7.5* 7.7* 7.4* 7.5*  PHOS 6.4*  --  6.9*  --  7.2*  --  5.7*  --  5.9*   < > = values in this interval not displayed.    CBG: Recent Labs  Lab 04/20/24 1122 04/20/24 1602 04/20/24 2027 04/21/24 0727 04/21/24 1137  GLUCAP 129* 164* 163* 109* 129*    Recent Results (from the past 240 hours)  Gram stain     Status: None   Collection Time: 04/17/24  9:28 AM   Specimen: Ascitic  Result Value Ref Range Status   Specimen Description ASCITIC  Final   Special Requests ASCITIC  Final   Gram Stain   Final    WBC PRESENT,BOTH PMN AND MONONUCLEAR NO ORGANISMS SEEN CYTOSPIN SMEAR Performed at [facility], [address] [place], [place] [address]    Report Status 04/17/2024 FINAL  Final  Culture, body fluid w Gram Stain-bottle     Status: Abnormal   Collection Time: 04/17/24  9:28 AM   Specimen: Ascitic  Result Value Ref Range Status   Specimen Description   Final    ASCITIC Performed at [facility],  [address], [place], [place] [address]    Special Requests   Final    BOTTLES DRAWN AEROBIC AND ANAEROBIC Blood Culture adequate volume Performed at [facility], [address] [place], [place] [address]    Gram Stain   Final    IN BOTH AEROBIC AND ANAEROBIC BOTTLES GRAM NEGATIVE RODS Gram Stain Report Called to,Read Back By and Verified WithBETHA LEOTIS SLICKER @ 1913 ON 04/17/24 C VARNER Performed at [facility], [address], [place], [place] [address]    Culture ESCHERICHIA COLI (A)  Final   Report Status 04/19/2024 FINAL  Final   Organism ID, Bacteria ESCHERICHIA COLI  Final      Susceptibility   Escherichia coli - MIC*    AMPICILLIN <=2 SENSITIVE Sensitive     CEFAZOLIN  (NON-URINE) <=1 SENSITIVE Sensitive     CEFEPIME <=0.12 SENSITIVE Sensitive     ERTAPENEM <=0.12 SENSITIVE Sensitive     CEFTRIAXONE <=0.25 SENSITIVE Sensitive     CIPROFLOXACIN  <=0.06 SENSITIVE Sensitive     GENTAMICIN <=1 SENSITIVE Sensitive     MEROPENEM <=0.25 SENSITIVE Sensitive     TRIMETH/SULFA <=20 SENSITIVE Sensitive     AMPICILLIN/SULBACTAM <=2 SENSITIVE Sensitive     PIP/TAZO Value in next row Sensitive      <=4 SENSITIVEThis is a modified FDA-approved test that has been validated and its performance characteristics determined by the reporting laboratory.  This laboratory is certified under the Clinical Laboratory Improvement Amendments CLIA as qualified to perform high complexity clinical laboratory testing.    * ESCHERICHIA COLI     Radiology Studies: No results found.   Scheduled Meds:  allopurinol   150 mg Oral Daily   aspirin  EC  81 mg Oral Daily   Chlorhexidine  Gluconate Cloth  6 each Topical Daily   Chlorhexidine  Gluconate Cloth  6 each Topical Q0600   feeding supplement  1 Container Oral BID BM   fluticasone   2 spray Each Nare Daily   furosemide   40 mg Intravenous Daily   heparin   5,000 Units Subcutaneous Q8H   insulin  aspart  0-9 Units Subcutaneous TID WC   labetalol   100 mg Oral BID    loratadine   10 mg Oral Daily   megestrol   40 mg Oral Daily   melatonin  3 mg Oral QHS   multivitamin with minerals  1 tablet Oral Daily   pantoprazole   40 mg Oral QPM   predniSONE   60 mg Oral Q breakfast  simethicone   80 mg Oral QID   Continuous Infusions:  anticoagulant sodium citrate      ciprofloxacin  400 mg (04/21/24 0509)   sodium bicarbonate 150 mEq in sterile water  1,150 mL infusion 75 mL/hr at 04/21/24 1009     LOS: 11 days   Time spent: 50 mins  Eric Nunnery, MD How to contact the [facility] Attending or Consulting provider 7A - 7P or covering provider during after hours 7P -7A, for this patient?  Check the care team in [facility] and look for a) attending/consulting TRH provider listed and b) the TRH team listed Log into [email or web address] to find provider on call.  Locate the TRH provider you are looking for under [facility] and page to a number that you can be directly reached. If you still have difficulty reaching the provider, please page the [facility] (Director on Call) for the Hospitalists listed on amion for assistance.  04/21/2024, 3:35 PM

## 2024-04-22 LAB — COMPREHENSIVE METABOLIC PANEL WITH GFR
ALT: 87 U/L — ABNORMAL HIGH (ref 0–44)
AST: 148 U/L — ABNORMAL HIGH (ref 15–41)
Albumin: 3 g/dL — ABNORMAL LOW (ref 3.5–5.0)
Alkaline Phosphatase: 244 U/L — ABNORMAL HIGH (ref 38–126)
Anion gap: 14 (ref 5–15)
BUN: 69 mg/dL — ABNORMAL HIGH (ref 8–23)
CO2: 34 mmol/L — ABNORMAL HIGH (ref 22–32)
Calcium: 7.5 mg/dL — ABNORMAL LOW (ref 8.9–10.3)
Chloride: 95 mmol/L — ABNORMAL LOW (ref 98–111)
Creatinine, Ser: 1.76 mg/dL — ABNORMAL HIGH (ref 0.61–1.24)
GFR, Estimated: 37 mL/min — ABNORMAL LOW (ref >60)
Glucose, Bld: 102 mg/dL — ABNORMAL HIGH (ref 70–99)
Potassium: 3.5 mmol/L (ref 3.5–5.1)
Sodium: 143 mmol/L (ref 135–145)
Total Bilirubin: 0.4 mg/dL (ref 0.0–1.2)
Total Protein: 4.9 g/dL — ABNORMAL LOW (ref 6.5–8.1)

## 2024-04-22 LAB — PHOSPHORUS: Phosphorus: 5.7 mg/dL — ABNORMAL HIGH (ref 2.5–4.6)

## 2024-04-22 LAB — GLUCOSE, CAPILLARY
Glucose-Capillary: 107 mg/dL — ABNORMAL HIGH (ref 70–99)
Glucose-Capillary: 122 mg/dL — ABNORMAL HIGH (ref 70–99)
Glucose-Capillary: 163 mg/dL — ABNORMAL HIGH (ref 70–99)
Glucose-Capillary: 191 mg/dL — ABNORMAL HIGH (ref 70–99)

## 2024-04-22 LAB — CYTOLOGY - NON PAP

## 2024-04-22 LAB — SURGICAL PATHOLOGY

## 2024-04-22 LAB — LACTATE DEHYDROGENASE: LDH: 996 U/L — ABNORMAL HIGH (ref 105–235)

## 2024-04-22 NOTE — Plan of Care (Signed)

## 2024-04-22 NOTE — TOC Progression Note (Signed)
 Transition of Care [facility]) - Progression Note    Patient Details  Name: Cody Palmer MRN: [id number] Date of Birth: June 15, 1932  Transition [facility]) CM/SW Contact  Lucie Lunger, [place] Phone Number: 04/22/2024, 11:49 AM  Clinical Narrative:    Family choice SNF [facility] offered bed for pt. CSW attempted to reach pts daughter x2 to update, there was no answer. Will follow up at a later time. TOC to follow.   Expected Discharge Plan: Skilled Nursing Facility Barriers to Discharge: Continued Medical Work up               Expected Discharge Plan and Services       Living arrangements for the past 2 months: Single Family Home                                       Social Drivers of Health (SDOH) Interventions SDOH Screenings   Food Insecurity: No Food Insecurity (04/10/2024)  Housing: Low Risk  (04/10/2024)  Transportation Needs: No Transportation Needs (04/10/2024)  Utilities: Not At Risk (04/10/2024)  Social Connections: Moderately Isolated (04/10/2024)  Tobacco Use: Medium Risk (04/17/2024)    Readmission Risk Interventions    04/19/2024    1:46 PM  Readmission Risk Prevention Plan  Transportation Screening Complete  PCP or Specialist Appt within 3-5 Days Not Complete  HRI or Home Care Consult Complete  Social Work Consult for Recovery Care Planning/Counseling Complete  Palliative Care Screening Not Applicable  Medication Review Oceanographer) Complete

## 2024-04-22 NOTE — Progress Notes (Signed)
 Nutrition Follow-up  DOCUMENTATION CODES:   Non-severe (moderate) malnutrition in context of acute illness/injury  INTERVENTION:   Continue to offer Boost Breeze po BID, each supplement provides 250 kcal and 9 grams of protein Continue Magic cup TID with meals, each supplement provides 290 kcal and 9 grams of protein Continue MVI with minerals daily  NUTRITION DIAGNOSIS:   Moderate Malnutrition related to acute illness (abdominal pain, suspected lymphoma) as evidenced by energy intake < 75% for > 7 days, mild fat depletion, mild muscle depletion; ongoing.  GOAL:   Patient will meet greater than or equal to 90% of their needs; progressing.  MONITOR:   PO intake, Supplement acceptance  REASON FOR ASSESSMENT:   Rounds, Malnutrition Screening Tool    ASSESSMENT:   89 yo male admitted with AKI. PMH includes CKD, HTN, HLD, DM, skin cancer, recent hospitalization for abdominal distention and concern for colitis vs lymphoma.  Patient remains on a soft diet. Intake seems to be improving some. Meal intakes documented at 25-100%. He is being offered Boost Breeze BID, but has refused it twice today.  Labs reviewed.  Phos 5.7 CBG: 107-122  Medications reviewed and include lasix , novolog , megace , MVI, protonix , mylicon.   IVF: sodium bicarb in sterile water  at 75 ml/h  Diet Order:   Diet Order             DIET SOFT Room service appropriate? Yes; Fluid consistency: Thin  Diet effective now                   EDUCATION NEEDS:   No education needs have been identified at this time  Skin:  Skin Assessment: Reviewed RN Assessment  Last BM:  12/6  Height:   Ht Readings from Last 1 Encounters:  04/10/24 5' 9 (1.753 m)    Weight:   Wt Readings from Last 1 Encounters:  04/10/24 93.4 kg    Ideal Body Weight:  72.7 kg  BMI:  Body mass index is 30.42 kg/m.  Estimated Nutritional Needs:   Kcal:  2000-2200  Protein:  100-110 gm  Fluid:  2-2.2 L   Suzen HUNT RD, LDN, CNSC Contact via secure chat. If unavailable, use group chat RD Inpatient.

## 2024-04-22 NOTE — Progress Notes (Signed)
 Nurse at bedside with pt and son in law. Cody Palmer reports that he is feeling better today. He ate most of his breakfast (60%). Has not had a bowel movement today. Agreed to take a walk with the mobility tech Darren After his wash up. Son in law has no complaints or questions either at this time.

## 2024-04-22 NOTE — Progress Notes (Signed)
 Patient ID: Cody Palmer, male   DOB: [birth year], 89 y.o.   MRN: [id number]  Assessment/Plan:  AKI - likely ischemic ATN due to combination of volume depletion due to poor po intake along with diarrhea and concomitant ARB/HCT and SGLT2-inhibitor.  Also considering renal involvement from lymphoma/tumor lysis syndrome. Less likely to be abd compartment syndrome since Cr improving. Continue to hold losartan  hct and jardiance .   Continue with foley catheter placement given symptoms of retention and poor UOP documented.   Continue with fluids (bicarb) with lasix . Good urine output, Cr improving.  Continue with allopurinol .  Lymph node biopsy report pending, can hold off on renal biopsy at this time Creatinine improving, remains nonoliguric.  No indications for renal replacement therapy at this junction Avoid nephrotoxic medications including NSAIDs and iodinated intravenous contrast exposure unless the latter is absolutely indicated.  Preferred narcotic agents for pain control are hydromorphone , fentanyl , and methadone. Morphine  should not be used. Avoid Baclofen and avoid oral sodium phosphate  and magnesium  citrate based laxatives / bowel preps. Continue strict Input and Output monitoring. Will monitor the patient closely with you and intervene or adjust therapy as indicated by changes in clinical status/labs   Abdominal distention - worsening since admission per daughter.  Enlarging mesenteric mass concerning for lymphoma.  Repeat CT scan with po contrast without evidence of SBO.  GI following.  Was to have PET scan on 04/18/24 and lymph node biopsy.  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   Not sure he would need lymph node biopsy to make diagnosis given path report.  Appreciate Heme/Onc consult and CT guided biopsy at [facility].  Hopefully can start treatment while an inpatient. Hyperkalemia -  improved Increasing ascites with gram negative peritonitis- s/p para 12/3 (2.2L removed), consider another paracentesis with albumin  support. Abx per primary service Abnormal LFT's - unclear etiology as CT of abd and pelvis without cirrhosis or suspicious focal abnormality.  Avoid hepatotoxic agents.  Improved but worsened again today, per primary Anemia - ESA on hold given likely malignancy.  Oncology following.   Elevated LDH also concerning. Transfuse prn for hgb <7   S: patient seen and examined bedside. He reports that he has been feeling better. Family at bedside (son) and patient does report worsening abdominal distension. No other complaints  O:BP (!) 128/57 (BP Location: Left Wrist)   Pulse 63   Temp 97.7 F (36.5 C) (Oral)   Resp 20   Ht 5' 9 (1.753 m)   Wt 93.4 kg   SpO2 93%   BMI 30.42 kg/m   Intake/Output Summary (Last 24 hours) at 04/22/2024 0909 Last data filed at 04/22/2024 0343 Gross per 24 hour  Intake 1732.7 ml  Output 1650 ml  Net 82.7 ml   Intake/Output: I/O last 3 completed shifts: In: 1732.7 [P.O.:1200; I.V.:532.7] Out: 2600 [Urine:2600]  Intake/Output this shift:  No intake/output data recorded. Weight change:  Gen: NAD, sitting up in bed CVS: RRR Resp: decreased breath sounds bibasilar, no w/r/r/c, unlabored Abd: distended, +BS Ext: 1+ pretibial edema Neuro: awake, alert  Recent Labs  Lab 04/16/24 0445 04/17/24 0500 04/17/24 0504 04/17/24 1841 04/18/24 0510 04/18/24 1836 04/19/24 0456 04/19/24 1759 04/20/24 0800 04/20/24 1842 04/21/24 0438 04/22/24 0422  NA 137  --  136   < > 137 135 137 138 140 139 140 143  K 5.2*  --  5.3*   < >  4.8 5.0 4.7 4.4 4.1 3.8 3.5 3.5  CL 104  --  100   < > 98 96* 96* 96* 96* 94* 94* 95*  CO2 18*  --  21*   < > 20* 24 22 25  36* 29 30 34*  GLUCOSE 91  --  121*   < > 111* 148* 133* 173* 108* 173* 112* 102*  BUN 59*  --  70*   < > 76* 84* 84* 84* 84* 82* 81* 69*  CREATININE 2.35*  --  3.01*   < > 3.06* 2.95*  2.83* 2.62* 2.39* 2.31* 2.08* 1.76*  ALBUMIN  3.2*  3.2*   < > 3.3*   < > 3.1* 3.0* 3.0* 3.1* 3.1* 3.1* 3.0* 3.0*  CALCIUM  8.2*  --  8.3*   < > 8.0* 7.7* 7.5* 7.5* 7.7* 7.4* 7.5* 7.5*  PHOS 5.3*  --  6.4*  --  6.9*  --  7.2*  --  5.7*  --  5.9* 5.7*  AST 61*   < >  --    < > 42* 37 36 35 33 35 34 148*  ALT 35   < >  --    < > 25 22 21 20 17 18 17  87*   < > = values in this interval not displayed.   Liver Function Tests: Recent Labs  Lab 04/20/24 1842 04/21/24 0438 04/22/24 0422  AST 35 34 148*  ALT 18 17 87*  ALKPHOS 73 66 244*  BILITOT 0.3 0.3 0.4  PROT 5.2* 5.0* 4.9*  ALBUMIN  3.1* 3.0* 3.0*   No results for input(s): LIPASE, AMYLASE in the last 168 hours. No results for input(s): AMMONIA in the last 168 hours. CBC: Recent Labs  Lab 04/17/24 0500 04/21/24 0438  WBC 7.0 5.8  HGB 8.2* 7.6*  HCT 25.6* 24.6*  MCV 85.6 87.9  PLT 144* 190   Cardiac Enzymes: No results for input(s): CKTOTAL, CKMB, CKMBINDEX, TROPONINI in the last 168 hours. CBG: Recent Labs  Lab 04/21/24 0727 04/21/24 1137 04/21/24 1614 04/21/24 2113 04/22/24 0742  GLUCAP 109* 129* 174* 149* 107*    Iron Studies: No results for input(s): IRON, TIBC, TRANSFERRIN, FERRITIN in the last 72 hours. Studies/Results: No results found.   allopurinol   150 mg Oral Daily   aspirin  EC  81 mg Oral Daily   Chlorhexidine  Gluconate Cloth  6 each Topical Daily   Chlorhexidine  Gluconate Cloth  6 each Topical Q0600   feeding supplement  1 Container Oral BID BM   fluticasone   2 spray Each Nare Daily   furosemide   40 mg Intravenous Daily   heparin   5,000 Units Subcutaneous Q8H   insulin  aspart  0-9 Units Subcutaneous TID WC   labetalol   100 mg Oral BID   loratadine   10 mg Oral Daily   megestrol   40 mg Oral Daily   melatonin  3 mg Oral QHS   multivitamin with minerals  1 tablet Oral Daily   pantoprazole   40 mg Oral QPM   simethicone   80 mg Oral QID    BMET    Component Value Date/Time    NA 143 04/22/2024 0422   NA 139 03/27/2024 1513   K 3.5 04/22/2024 0422   CL 95 (L) 04/22/2024 0422   CO2 34 (H) 04/22/2024 0422   GLUCOSE 102 (H) 04/22/2024 0422   BUN 69 (H) 04/22/2024 0422   BUN 32 (H) 03/27/2024 1513   CREATININE 1.76 (H) 04/22/2024 0422   CALCIUM  7.5 (L) 04/22/2024 0422  GFRNONAA 37 (L) 04/22/2024 0422   GFRAA >60 04/19/2016 1459   CBC    Component Value Date/Time   WBC 5.8 04/21/2024 0438   RBC 2.80 (L) 04/21/2024 0438   HGB 7.6 (L) 04/21/2024 0438   HGB 12.7 (L) 03/27/2024 1513   HCT 24.6 (L) 04/21/2024 0438   HCT 38.9 03/27/2024 1513   PLT 190 04/21/2024 0438   PLT 126 (L) 03/27/2024 1513   MCV 87.9 04/21/2024 0438   MCV 85 03/27/2024 1513   MCH 27.1 04/21/2024 0438   MCHC 30.9 04/21/2024 0438   RDW 18.0 (H) 04/21/2024 0438   RDW 14.9 03/27/2024 1513   LYMPHSABS 1.4 04/10/2024 1046   LYMPHSABS 3.2 (H) 03/27/2024 1513   MONOABS 0.5 04/10/2024 1046   EOSABS 0.1 04/10/2024 1046   EOSABS 0.1 03/27/2024 1513   BASOSABS 0.0 04/10/2024 1046   BASOSABS 0.1 03/27/2024 1513

## 2024-04-22 NOTE — Plan of Care (Signed)
   Problem: Clinical Measurements: Goal: Ability to maintain clinical measurements within normal limits will improve Outcome: Progressing Goal: Will remain free from infection Outcome: Progressing

## 2024-04-22 NOTE — Progress Notes (Signed)
 Mobility Specialist Progress Note:    04/22/24 1430  Mobility  Activity Ambulated with assistance  Level of Assistance Independent  Assistive Device Front wheel walker  Distance Ambulated (ft) 180 ft  Range of Motion/Exercises Active;All extremities  Activity Response Tolerated well  Mobility Referral Yes  Mobility visit 1 Mobility  Mobility Specialist Start Time (ACUTE ONLY) 1430  Mobility Specialist Stop Time (ACUTE ONLY) 1451  Mobility Specialist Time Calculation (min) (ACUTE ONLY) 21 min   Pt received in chair, agreeable to mobility. Required SBA to stand and ambulate with RW. Tolerated well, SpO2 94% on 1L at rest. SpO2 93-96% on 2L during ambulation, returned to chair. Family in room, all needs met.  Glenden Rossell Mobility Specialist Please contact via Special Educational Needs Teacher or  Rehab office at [phone]

## 2024-04-22 NOTE — Progress Notes (Signed)
 Mobility Specialist Progress Note:    04/22/24 1200  Mobility  Activity Ambulated with assistance  Level of Assistance Standby assist, set-up cues, supervision of patient - no hands on  Assistive Device Front wheel walker  Distance Ambulated (ft) 120 ft  Range of Motion/Exercises Active;All extremities  Activity Response Tolerated well  Mobility Referral Yes  Mobility visit 1 Mobility  Mobility Specialist Start Time (ACUTE ONLY) 1200  Mobility Specialist Stop Time (ACUTE ONLY) 1225  Mobility Specialist Time Calculation (min) (ACUTE ONLY) 25 min   Pt received in chair, agreeable to mobility. Required SBA to stand and ambulate with RW. Tolerated well, c/o lightheadedness during ambulation. Returned to chair, all needs met.  Cody Palmer Mobility Specialist Please contact via Special Educational Needs Teacher or  Rehab office at [phone]

## 2024-04-22 NOTE — Progress Notes (Signed)
 PROGRESS NOTE   Cody Palmer  FMW:[id number] DOB: 1932-09-15 DOA: 04/10/2024 PCP: [name], NP   Chief Complaint  Patient presents with   Weakness   Level of care: Med-Surg  Brief Admission History:  Cody Palmer is a 89 year old male with stage IIIa CKD, hypertension, hyperlipidemia, type 2 diabetes mellitus, controlled, recently hospitalized for abdominal distention and concern for colitis versus lymphoma.  He was discharged 4 days ago to home.  He returns today due to poor oral intake, almost no appetite, poor fluid consumption and failure to thrive.  He has become increasingly more weak.  He was seen by GI and had colonoscopy on 04/05/2024 with findings of polyps but otherwise pretty normal.  He also had been seen by oncology during last hospital stay and arrangements made for outpatient PET scan on 04/18/2024 and then will proceed with a lymph node biopsy with IR.  Patient has been having nausea and taking Zofran , abdominal distention persist, poor oral intake persists.  He has been having loose diarrheal stools.  Today he is noted to have another AKI and started on IV fluid hydration.  Admission was requested for further management.    Assessment and Plan:  AKI on CKD stage 3a/hyperkalemia/lymphoma. -- due to poor oral intake and diarrhea -- initially started to improve but now trending back up --CT scan of the abdomen has been ordered as recommended by nephrology. -- Continue to follow BMP as creatinine trends down -- Continue avoiding nephrotoxins - Patient's potassium level down to 3.5.  Lokelma  has been discontinued for now. - Creatinine has worsened up to 1.76; patient's GFR 30. -Patient BUN 84. -Continue to follow nephrology service recommendation. - Continue to maintain adequate hydration; continue the use of Lasix  as per nephrology service recommendation.  Nephrology service will hold off on placing dialysis catheter at the moment as patient renal  function has improved and so far electrolytes are stable. -No acute indication for dialysis appreciated. -Emergent to continue assisting possible TLS will continue the use of allopurinol .   Essential hypertension  -- stable, resume home meds as able -- resume home labetalol  at reduced dose of 100 mg BID with hold parameters. -Continue to hold the rest of his antihypertensive agents. -Follow vital signs.   Controlled type 2 DM  -- sensitive SSI coverage ordered  CBG (last 3)  Recent Labs    04/22/24 0742 04/22/24 1117 04/22/24 1614  GLUCAP 107* 122* 163*   Generalized Weakness -- secondary to metabolic derangements  -- encourage ambulation and physical interaction  --might need significant assistance at discharge   Adult failure to thrive -- he remains weak, failed recent discharge home -- Appreciate assistance and recommendation by dietitian regarding feeding supplements. -- hopefully he improves with supportive measures --Follow recommendations by oncology service Megace  has been started. -- Continue to follow clinical response   Abdominal distension Question of abdominal lymphoma -- ongoing outpatient work up  -- he is to have a PET scan outpatient on 12/4 -- Oncology service has been consulted. -- he is to have IR lymph node biopsy after PET scan - acute abdomen series demonstrating not SBO - CT scan demonstrated incisional loop dilations without SBO; moderate ascites. -S/P paracentesis (2.2L removed) on 04/17/2024 -IV albumin  has been ordered. - Core biopsy has been been performed without complications - Continue to follow uric acid, LDH and complete metabolic panel. - Per oncology service rasburicase  x 1 given on 04/17/2024 and patient has been started on prednisone  60 mg  twice a day. 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 - Continue the use of simethicone .  Gram negative rods peritonitis  -  Patient's ascitic fluid examination demonstrating the presence of gram-negative rods in his aerobic and anaerobic cultures. - Ciprofloxacin  daily (dose adjusted for renal function) has been started - Patient is afebrile; but since initiation of his steroids and increased risk for systemic infection. - Adequate antibiotic with the use of ciprofloxacin  as per sensitivity results.   DVT prophylaxis: heparin  Code Status: DNR  Family Communication: daughter bedside updated Disposition: anticipate home when stabilized    Consultants:  Nephrology service, oncology service, interventional radiology and gastroenterology.  Procedures:  See below for x-ray reports.  Antimicrobials:  Ciprofloxacin .   Subjective: Reports no abdominal pain, no nausea, no vomiting, no fever.  Appetite better today.  In no acute distress.  Objective: Vitals:   04/21/24 1332 04/21/24 1923 04/22/24 0351 04/22/24 1238  BP: (!) 111/55 134/63 (!) 128/57 (!) 121/49  Pulse: 71 75 63 70  Resp: 16 16 20    Temp: 97.9 F (36.6 C) (!) 97.5 F (36.4 C) 97.7 F (36.5 C) 98.5 F (36.9 C)  TempSrc: Oral Oral Oral Oral  SpO2: 93% 97% 93% 91%  Weight:      Height:        Intake/Output Summary (Last 24 hours) at 04/22/2024 1636 Last data filed at 04/22/2024 1300 Gross per 24 hour  Intake 600 ml  Output 2650 ml  Net -2050 ml   Filed Weights   04/10/24 1006 04/10/24 1357  Weight: 92.4 kg 93.4 kg   Examination: General exam: Alert, awake, oriented x 3; feeling better reporting some improvement in his appetite.  Continues to notice improving urine output.  No fever Respiratory system: Good saturation on room air. Cardiovascular system:RRR.  No rubs, no gallops, no JVD. Gastrointestinal system: Abdomen is less distended, nontender, positive bowel sounds.  No guarding. Central nervous system: No focal neurological deficits. Extremities: No cyanosis or clubbing; trace to 1+ edema appreciated bilaterally. Skin: No  petechiae. Psychiatry: Judgement and insight appear normal.  Flat affect appreciated on exam.  Data Reviewed: I have personally reviewed following labs and imaging studies  CBC: Recent Labs  Lab 04/17/24 0500 04/21/24 0438  WBC 7.0 5.8  HGB 8.2* 7.6*  HCT 25.6* 24.6*  MCV 85.6 87.9  PLT 144* 190    Basic Metabolic Panel: Recent Labs  Lab 04/18/24 0510 04/18/24 1836 04/19/24 0456 04/19/24 1759 04/20/24 0800 04/20/24 1842 04/21/24 0438 04/22/24 0422  NA 137   < > 137 138 140 139 140 143  K 4.8   < > 4.7 4.4 4.1 3.8 3.5 3.5  CL 98   < > 96* 96* 96* 94* 94* 95*  CO2 20*   < > 22 25 36* 29 30 34*  GLUCOSE 111*   < > 133* 173* 108* 173* 112* 102*  BUN 76*   < > 84* 84* 84* 82* 81* 69*  CREATININE 3.06*   < > 2.83* 2.62* 2.39* 2.31* 2.08* 1.76*  CALCIUM  8.0*   < > 7.5* 7.5* 7.7* 7.4* 7.5* 7.5*  PHOS 6.9*  --  7.2*  --  5.7*  --  5.9* 5.7*   < > = values in this interval not displayed.    CBG: Recent Labs  Lab 04/21/24 1614 04/21/24 2113 04/22/24 0742 04/22/24 1117 04/22/24 1614  GLUCAP 174* 149* 107* 122* 163*    Recent Results (from  the past 240 hours)  Gram stain     Status: None   Collection Time: 04/17/24  9:28 AM   Specimen: Ascitic  Result Value Ref Range Status   Specimen Description ASCITIC  Final   Special Requests ASCITIC  Final   Gram Stain   Final    WBC PRESENT,BOTH PMN AND MONONUCLEAR NO ORGANISMS SEEN CYTOSPIN SMEAR Performed at [facility], [address], [place], [place] [address]    Report Status 04/17/2024 FINAL  Final  Culture, body fluid w Gram Stain-bottle     Status: Abnormal   Collection Time: 04/17/24  9:28 AM   Specimen: Ascitic  Result Value Ref Range Status   Specimen Description   Final    ASCITIC Performed at [facility], [address], [place], [place] [address]    Special Requests   Final    BOTTLES DRAWN AEROBIC AND ANAEROBIC Blood Culture adequate volume Performed at [facility], [address], [place],  [place] [address]    Gram Stain   Final    IN BOTH AEROBIC AND ANAEROBIC BOTTLES GRAM NEGATIVE RODS Gram Stain Report Called to,Read Back By and Verified WithBETHA LEOTIS SLICKER @ 1913 ON 04/17/24 C VARNER Performed at [facility], [address] [place], [place] [address]    Culture ESCHERICHIA COLI (A)  Final   Report Status 04/19/2024 FINAL  Final   Organism ID, Bacteria ESCHERICHIA COLI  Final      Susceptibility   Escherichia coli - MIC*    AMPICILLIN <=2 SENSITIVE Sensitive     CEFAZOLIN  (NON-URINE) <=1 SENSITIVE Sensitive     CEFEPIME <=0.12 SENSITIVE Sensitive     ERTAPENEM <=0.12 SENSITIVE Sensitive     CEFTRIAXONE <=0.25 SENSITIVE Sensitive     CIPROFLOXACIN  <=0.06 SENSITIVE Sensitive     GENTAMICIN <=1 SENSITIVE Sensitive     MEROPENEM <=0.25 SENSITIVE Sensitive     TRIMETH/SULFA <=20 SENSITIVE Sensitive     AMPICILLIN/SULBACTAM <=2 SENSITIVE Sensitive     PIP/TAZO Value in next row Sensitive      <=4 SENSITIVEThis is a modified FDA-approved test that has been validated and its performance characteristics determined by the reporting laboratory.  This laboratory is certified under the Clinical Laboratory Improvement Amendments CLIA as qualified to perform high complexity clinical laboratory testing.    * ESCHERICHIA COLI     Radiology Studies: No results found.   Scheduled Meds:  allopurinol   150 mg Oral Daily   aspirin  EC  81 mg Oral Daily   Chlorhexidine  Gluconate Cloth  6 each Topical Daily   Chlorhexidine  Gluconate Cloth  6 each Topical Q0600   feeding supplement  1 Container Oral BID BM   fluticasone   2 spray Each Nare Daily   furosemide   40 mg Intravenous Daily   heparin   5,000 Units Subcutaneous Q8H   insulin  aspart  0-9 Units Subcutaneous TID WC   labetalol   100 mg Oral BID   loratadine   10 mg Oral Daily   megestrol   40 mg Oral Daily   melatonin  3 mg Oral QHS   multivitamin with minerals  1 tablet Oral Daily   pantoprazole   40 mg Oral QPM   simethicone   80 mg  Oral QID   Continuous Infusions:  anticoagulant sodium citrate      ciprofloxacin  400 mg (04/22/24 0552)   sodium bicarbonate 150 mEq in sterile water  1,150 mL infusion 75 mL/hr at 04/22/24 1604     LOS: 12 days   Time spent: 50 mins  Eric Nunnery, MD How to contact the [facility] Attending or Consulting provider 7A - 7P or covering provider during after hours 7P -7A, for this patient?  Check the care team in [facility] and look for a) attending/consulting [facility] provider listed and b) the [facility] team listed Log into [email or web address] to find provider on call.  Locate the [facility] provider you are looking for under [facility] and page to a number that you can be directly reached. If you still have difficulty reaching the provider, please page the [facility] (Director on Call) for the Hospitalists listed on amion for assistance.  04/22/2024, 4:36 PM

## 2024-04-23 ENCOUNTER — Other Ambulatory Visit: Payer: Self-pay | Admitting: Hematology

## 2024-04-23 DIAGNOSIS — C8299 Follicular lymphoma, unspecified, extranodal and solid organ sites: Secondary | ICD-10-CM

## 2024-04-23 DIAGNOSIS — C8290 Follicular lymphoma, unspecified, unspecified site: Secondary | ICD-10-CM | POA: Insufficient documentation

## 2024-04-23 LAB — LACTATE DEHYDROGENASE: LDH: 952 U/L — ABNORMAL HIGH (ref 105–235)

## 2024-04-23 LAB — COMPREHENSIVE METABOLIC PANEL WITH GFR
ALT: 57 U/L — ABNORMAL HIGH (ref 0–44)
AST: 60 U/L — ABNORMAL HIGH (ref 15–41)
Albumin: 3.1 g/dL — ABNORMAL LOW (ref 3.5–5.0)
Alkaline Phosphatase: 184 U/L — ABNORMAL HIGH (ref 38–126)
Anion gap: 7 (ref 5–15)
BUN: 64 mg/dL — ABNORMAL HIGH (ref 8–23)
CO2: 42 mmol/L — ABNORMAL HIGH (ref 22–32)
Calcium: 7.7 mg/dL — ABNORMAL LOW (ref 8.9–10.3)
Chloride: 95 mmol/L — ABNORMAL LOW (ref 98–111)
Creatinine, Ser: 1.51 mg/dL — ABNORMAL HIGH (ref 0.61–1.24)
GFR, Estimated: 44 mL/min — ABNORMAL LOW (ref >60)
Glucose, Bld: 104 mg/dL — ABNORMAL HIGH (ref 70–99)
Potassium: 3.2 mmol/L — ABNORMAL LOW (ref 3.5–5.1)
Sodium: 144 mmol/L (ref 135–145)
Total Bilirubin: 0.4 mg/dL (ref 0.0–1.2)
Total Protein: 4.7 g/dL — ABNORMAL LOW (ref 6.5–8.1)

## 2024-04-23 LAB — GLUCOSE, CAPILLARY
Glucose-Capillary: 92 mg/dL (ref 70–99)
Glucose-Capillary: 121 mg/dL — ABNORMAL HIGH (ref 70–99)
Glucose-Capillary: 156 mg/dL — ABNORMAL HIGH (ref 70–99)
Glucose-Capillary: 117 mg/dL — ABNORMAL HIGH (ref 70–99)

## 2024-04-23 LAB — PHOSPHORUS: Phosphorus: 3.9 mg/dL (ref 2.5–4.6)

## 2024-04-23 MED ORDER — POTASSIUM CHLORIDE CRYS ER 20 MEQ PO TBCR
40.0000 meq | EXTENDED_RELEASE_TABLET | Freq: Once | ORAL | Status: AC
  Administered 2024-04-23: 40 meq via ORAL
  Filled 2024-04-23: qty 2

## 2024-04-23 MED ORDER — PROCHLORPERAZINE MALEATE 10 MG PO TABS: 10.0000 mg | ORAL_TABLET | Freq: Four times a day (QID) | ORAL | 0 refills | Status: DC | PRN

## 2024-04-23 NOTE — Progress Notes (Signed)
 START ON PATHWAY REGIMEN - Lymphoma and CLL     A cycle is every 7 days:     Rituximab-xxxx   **Always confirm dose/schedule in your pharmacy ordering system**  Patient Characteristics: Follicular Lymphoma, Grades 1, 2, and 3A, First Line, Stage III / IV, Symptomatic or Bulky Disease Disease Type: Not Applicable Disease Type: Follicular Lymphoma, Grade 1, 2, or 3A Disease Type: Not Applicable Line of Therapy: First Line Disease Characteristics: Symptomatic or Bulky Disease Intent of Therapy: Curative Intent, Discussed with Patient

## 2024-04-23 NOTE — Plan of Care (Signed)
  Problem: Education: Goal: Knowledge of General Education information will improve Description: Including pain rating scale, medication(s)/side effects and non-pharmacologic comfort measures Outcome: Progressing   Problem: Clinical Measurements: Goal: Ability to maintain clinical measurements within normal limits will improve Outcome: Progressing   Problem: Safety: Goal: Ability to remain free from injury will improve Outcome: Progressing   Problem: Skin Integrity: Goal: Risk for impaired skin integrity will decrease Outcome: Progressing   Problem: Metabolic: Goal: Ability to maintain appropriate glucose levels will improve Outcome: Progressing

## 2024-04-23 NOTE — Progress Notes (Signed)
 Patient ID: Cody Palmer, male   DOB: [birth year], 89 y.o.   MRN: [id number] [facility] Progress Note   Assessment/ Plan:   1. Acute kidney Injury: Baseline creatinine appears to be 0.9-1.1.  Acute injury suspected to be from significant volume depletion/poor oral intake in the setting of ongoing ARB/thiazide/SGLT2 inhibitor use.  Improving with volume replacement while holding above medications.  Will discontinue isotonic sodium bicarbonate with his developing metabolic alkalosis/hypokalemia.  No additional intravenous fluids indicated at this time with current volume status, encouraged oral intake. 2.  Metabolic alkalosis/hypokalemia: Iatrogenic with ongoing sodium bicarbonate drip, will discontinue this today and allow for oral intake. 3.  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  Continue to avoid iodinated intravenous contrast in the setting of acute kidney injury. 4.  Anemia: Appears to be secondary to chronic illness +/- lymphoma.  Without overt loss and ongoing workup.  Subjective:   Reports to be feeling better with some tightness in his abdomen but without shortness of breath.   Objective:   BP 133/62 (BP Location: Right Arm)   Pulse 66   Temp 98.2 F (36.8 C) (Oral)   Resp 18   Ht 5' 9 (1.753 m)   Wt 93.4 kg   SpO2 91%   BMI 30.42 kg/m   Intake/Output Summary (Last 24 hours) at 04/23/2024 0902 Last data filed at 04/23/2024 0600 Gross per 24 hour  Intake 3008.36 ml  Output 1800 ml  Net 1208.36 ml   Weight change:   Physical Exam: Gen: Comfortably resting in bed, family member at bedside assisting with breakfast CVS: Pulse regular rhythm, normal rate, S1 and S2 normal Resp: Diminished breath sounds over bases, no rales/rhonchi Abd: Firm, moderately distended, mildly tender over lower quadrants Ext: 1-2+ bilateral pitting edema.  Imaging: No results  found.  Labs: BMET Recent Labs  Lab 04/17/24 0504 04/17/24 1841 04/18/24 0510 04/18/24 1836 04/19/24 0456 04/19/24 1759 04/20/24 0800 04/20/24 1842 04/21/24 0438 04/22/24 0422 04/23/24 0525  NA 136   < > 137   < > 137 138 140 139 140 143 144  K 5.3*   < > 4.8   < > 4.7 4.4 4.1 3.8 3.5 3.5 3.2*  CL 100   < > 98   < > 96* 96* 96* 94* 94* 95* 95*  CO2 21*   < > 20*   < > 22 25 36* 29 30 34* 42*  GLUCOSE 121*   < > 111*   < > 133* 173* 108* 173* 112* 102* 104*  BUN 70*   < > 76*   < > 84* 84* 84* 82* 81* 69* 64*  CREATININE 3.01*   < > 3.06*   < > 2.83* 2.62* 2.39* 2.31* 2.08* 1.76* 1.51*  CALCIUM  8.3*   < > 8.0*   < > 7.5* 7.5* 7.7* 7.4* 7.5* 7.5* 7.7*  PHOS 6.4*  --  6.9*  --  7.2*  --  5.7*  --  5.9* 5.7*  --    < > = values in this interval not displayed.   CBC Recent Labs  Lab 04/17/24 0500 04/21/24 0438  WBC 7.0 5.8  HGB 8.2* 7.6*  HCT 25.6* 24.6*  MCV 85.6 87.9  PLT 144* 190    Medications:     allopurinol   150 mg Oral Daily   aspirin  EC  81 mg Oral Daily   Chlorhexidine  Gluconate Cloth  6  each Topical Daily   Chlorhexidine  Gluconate Cloth  6 each Topical Q0600   feeding supplement  1 Container Oral BID BM   fluticasone   2 spray Each Nare Daily   furosemide   40 mg Intravenous Daily   heparin   5,000 Units Subcutaneous Q8H   insulin  aspart  0-9 Units Subcutaneous TID WC   labetalol   100 mg Oral BID   loratadine   10 mg Oral Daily   megestrol   40 mg Oral Daily   melatonin  3 mg Oral QHS   multivitamin with minerals  1 tablet Oral Daily   pantoprazole   40 mg Oral QPM   simethicone   80 mg Oral QID    Gordy Blanch, MD 04/23/2024, 9:02 AM

## 2024-04-23 NOTE — Patient Instructions (Addendum)
 [facility] Chemotherapy Teaching   You have been diagnosed with Lymphoma by your oncologist.  You will receive the treatment Rituxan every week as prescribed by your doctor with the intent of cure.  You will see the doctor regularly throughout treatment.  We will obtain blood work from you prior to every treatment and monitor your results to make sure it is safe to give your treatment. The doctor monitors your response to treatment by the way you are feeling, your blood work, and by obtaining scans periodically.  There will be wait times while you are here for treatment.  It will take about 30 minutes to 1 hour for your lab work to result.  Then there will be wait times while pharmacy mixes your medications.     Medications you will receive in the clinic prior to your chemotherapy medications:  Tylenol :  Given to prevent infusion reactions to rituximab.  Benadryl :  Antihistamine given to prevent allergic/infusion reactions to rituximab.     Rituximab (Generic Name) Other Name: Rituxan  About This Drug  Rituximab is a monoclonal antibody used to treat cancer. This drug is given in the vein (IV).  This first time this is given it will be infused slower to monitor for infusion reactions.    Possible Side Effects (More Common)   Bone marrow depression. This is a decrease in the number of white blood cells, red blood cells, and platelets. This  may raise your risk of infection, make you tired and weak (fatigue), and raise your risk of bleeding.   Rash-skin irritation, redness or itching (dermatitis)   Flu-like symptoms: fever, headache, muscle and joint aches, and fatigue (low energy, feeling weak)   Infusion-related reactions   Hepatitis B - if you have ever had hepatitis B, the virus may come back during treatment with this drug. Your doctor will test to see if you have ever had hepatitis B prior to your treatment.   Changes in your central nervous system can happen.  The central nervous system is made up of your brain and spinal cord. You could feel: extreme tiredness, agitation, confusion, or have: hallucinations (see or hear things that are not there), trouble understanding or speaking, loss of control of your bowels or bladder, eyesight changes, numbness or lack of strength to your arms, legs, face, or body, seizures or coma. If you start to have any of these symptoms let your doctor know right away.   Tumor lysis: This drug may act on the cancer cells very quickly. This may affect how your kidneys work. Your doctor will monitor your kidney function.   Changes in your liver function. Your doctor will check your liver function as needed.   Nausea and throwing up (vomiting): these symptoms may happen within a few hours after your treatment and may last up to 24 hours. Medicines are available to stop or lessen these side effects.   Loose bowel movements (diarrhea) that may last for a few days   Abdominal pain   Infections   Cough, runny nose   Swelling of your legs, ankles and/or feet or hands   High blood pressure. Your doctor will check your blood pressure as needed.   Abnormal heart beat  Possible Side Effects (Less Common)   Shortness of breath   Soreness of the mouth and throat. You may have red areas, white patches, or sores that hurt.  Infusion Reactions  Infusion Reactions are the most common side effect linked to use of  this drug and can be quite severe. Medicines will be given before you get the drug to lower the severity of this side effect. The infusion reactions are the worse with the first dose of the drug and become less severe with more doses of the drug. While you are getting this drug in your vein (IV), tell your nurse right away if you have any of these symptoms of an allergic reaction:   Trouble catching your breath   Feeling like your tongue or throat are swelling   Feeling your heart beat quickly or in a not normal way  (palpitations)   Feeling dizzy or lightheaded   Flushing, itching, rash, and/or hives   Treating Side Effects   Ask your doctor or nurse about medicine to stop or lessen headache, loose bowel movements (diarrhea), constipation, nausea, throwing up (vomiting), or pain.   If you get a rash do not put anything it unless your doctor or nurse says you may. Keep the area around the rash clean and dry. Ask your doctor for medicine if the rash bothers you.   Drink 6-8 cups of fluids each day unless your doctor has told you to limit your fluid intake due to some other health problem. A cup is 8 ounces of fluid. If you throw up or have loose bowel movements, you should drink more fluids so that you do not become dehydrated (lack of water  in the body from losing too much fluid).   If you are not able to move your bowels, check with your doctor or nurse before you use enemas, laxatives, or suppositories   If you have mouth sores, avoid mouthwash that has alcohol. Also avoid alcohol and smoking because they can bother your mouth and throat.   If you have a nose bleed, sit with your head tipped slightly forward. Apply pressure by lightly pinching the bridge of your nose between your thumb and forefinger. Call your doctor if you feel dizzy or faint or if the bleeding doesn't stop after 10 to 15 minutes   Important Information   After treatment with this drug, vaccination with live viruses should be delayed until the immune system recovers.   Symptoms of abnormal bleeding may be: coughing up blood, throwing up blood (may look like coffee grounds), red or black, tarry bowel movements, blood in urine, abnormally heavy menstrual flow, nosebleeds, or any unusual bleeding.   Symptoms of high blood pressure may be: headache, blurred vision, confusion, chest pain, or a feeling that your heart is beat differently.   Urinary tract infection. Symptoms may include:   Pain or burning when you pass urine    Feeling like you have to pass urine often, but not much comes out when you do.   Tender or heavy feeling in your lower abdomen   Cloudy urine and/or urine that smells bad.   Pain on one side of your back under your ribs. This is where your kidneys are.   Fever, chills, nausea and/or throwing up   Food and Drug Interactions  There are no known interactions of rituximab and any food. This drug may interact with other medicines. Tell your doctor and pharmacist about all the medicines and dietary supplements (vitamins, minerals, herbs and others) that you are taking at this time. The safety and use of dietary supplements and alternative diets are often not known. Using these might affect your cancer or interfere with your treatment. Until more is known, you should not use dietary supplements or alternative  diets without your cancer doctor's help.   When to Call the Doctor  Call your doctor or nurse right away if you have any of these symptoms:   Fever of 100.4 F (38 C) higher   Chills   Trouble breathing   Rash with or without itching   Blistering or peeling of skin   Chest pain or symptoms of a heart attack. Most heart attacks involve pain in the center of the chest that lasts more than a few minutes. The pain may go away and come back or it can be constant. It can feel like pressure, squeezing, fullness, or pain. Sometimes pain is felt in one or both arms, the back, neck, jaw, or stomach. If any of these symptoms last 2 minutes, call 911   Easy bleeding or bruising   Blood in urine or bowel movements   Feeling that your heart is beating in a fast or not normal way (palpitations)   Nausea that stops you from eating or drinking   Throwing up/vomiting   Abdominal pain   Loose bowel movements (diarrhea) 4 times in one day or diarrhea with weakness or lightheadedness   No bowel movement in 3 days or if you feel uncomfortable   Feeling dizzy or lightheaded   Changes in your  speech or vision   Feeling confused   Weakness of your arms and legs or poor coordination (feeling clumsy)   Signs of liver problems: dark urine, pale bowel movements, bad stomach pain, feeling very tired and weak, unusual itching, or yellowing of skin or eyes   Symptoms of a urinary tract infection (see important information)   Call your doctor or nurse as soon as possible if you have any of these symptoms:   Swelling of your legs, ankles and/or feet   Fatigue and /or weakness that interferes with your daily activities   Joint and muscle pain or muscle spasms that are not relieved by prescribed medicines   Cough that lasts longer than normal   Reproduction Concerns   Pregnancy warning: This drug is known to cross the placenta. This drug may have harmful effects on an unborn baby. Effective methods of birth control should be used during treatment with this drug and for 12 months after the last treatment. If exposure occurs to an unborn baby, the baby's immune system may be affected, which could last for months after birth. Until the immune system recovers, live vaccines should not be administered to the baby. Be sure to talk with your doctor if you are pregnant or planning to become pregnant while getting this drug.   Breast feeding warning: It is not known if rituximab is passed into human breast milk. In animal studies, this drug was detected in in breast milk. For this reason, women should talk to their doctor about the risks and benefits of breast feeding during treatment with this drug because this drug may enter the breast milk and badly harm a breast feeding baby.     SELF CARE ACTIVITIES WHILE ON CHEMOTHERAPY/IMMUNOTHERAPY:  Hydration Increase your fluid intake 48 hours prior to treatment and drink at least 8 to 12 cups (64 ounces) of water /decaffeinated beverages per day after treatment. You can still have your cup of coffee or soda but these beverages do not count as part  of your 8 to 12 cups that you need to drink daily. No alcohol intake.  Medications Continue taking your normal prescription medication as prescribed.  If you start any new herbal  or new supplements please let us  know first to make sure it is safe.  Mouth Care Have teeth cleaned professionally before starting treatment. Keep dentures and partial plates clean. Use soft toothbrush and do not use mouthwashes that contain alcohol. Biotene is a good mouthwash that is available at most pharmacies or may be ordered by calling [phone]. Use warm salt water  gargles (1 teaspoon salt per 1 quart warm water ) before and after meals and at bedtime. Or you may rinse with 2 tablespoons of three-percent hydrogen peroxide mixed in eight ounces of water . If you are still having problems with your mouth or sores in your mouth please call the clinic. If you need dental work, please let the doctor know before you go for your appointment so that we can coordinate the best possible time for you in regards to your chemo regimen. You need to also let your dentist know that you are actively taking chemo. We may need to do labs prior to your dental appointment.  Skin Care Always use sunscreen that has not expired and with SPF (Sun Protection Factor) of 50 or higher. Wear hats to protect your head from the sun. Remember to use sunscreen on your hands, ears, face, & feet.  Use good moisturizing lotions such as udder cream, eucerin, or even Vaseline. Some chemotherapies can cause dry skin, color changes in your skin and nails.    Avoid long, hot showers or baths. Use gentle, fragrance-free soaps and laundry detergent. Use moisturizers, preferably creams or ointments rather than lotions because the thicker consistency is better at preventing skin dehydration. Apply the cream or ointment within 15 minutes of showering. Reapply moisturizer at night, and moisturize your hands every time after you wash them.   Infection  Prevention Please wash your hands for at least 30 seconds using warm soapy water . Handwashing is the #1 way to prevent the spread of germs. Stay away from sick people or people who are getting over a cold. If you develop respiratory systems such as green/yellow mucus production or productive cough or persistent cough let us  know and we will see if you need an antibiotic. It is a good idea to keep a pair of gloves on when going into grocery stores/[employer] to decrease your risk of coming into contact with germs on the carts, etc. Carry alcohol hand gel with you at all times and use it frequently if out in public. If your temperature reaches 100.5 or higher please call the clinic and let us  know.  If it is after hours or on the weekend please go to the ER if your temperature is over 100.4.  Please have your own personal thermometer at home to use.    Sex and bodily fluids If you are going to have sex, a condom must be used to protect the person that isn't taking immunotherapy. For a few days after treatment, immunotherapy can be excreted through your bodily fluids.  When using the toilet please close the lid and flush the toilet twice.  Do this for a few day after you have had immunotherapy.   Contraception It is not known for sure whether or not immunotherapy drugs can be passed on through semen or secretions from the vagina. Because of this some doctors advise people to use a barrier method if you have sex during treatment. This applies to vaginal, anal or oral sex.  Generally, doctors advise a barrier method only for the time you are actually having the treatment and for  about a week after your treatment.  Advice like this can be worrying, but this does not mean that you have to avoid being intimate with your partner. You can still have close contact with your partner and continue to enjoy sex.  Animals If you have cats or birds we just ask that you not change the litter or change the cage.  Please  have someone else do this for you while you are on immunotherapy.   Food Safety During and After Cancer Treatment Food safety is important for people both during and after cancer treatment. Cancer and cancer treatments, such as chemotherapy, radiation therapy, and stem cell/bone marrow transplantation, often weaken the immune system. This makes it harder for your body to protect itself from foodborne illness, also called food poisoning. Foodborne illness is caused by eating food that contains harmful bacteria, parasites, or viruses.  Foods to avoid Some foods have a higher risk of becoming tainted with bacteria. These include: Unwashed fresh fruit and vegetables, especially leafy vegetables that can hide dirt and other contaminants Raw sprouts, such as alfalfa sprouts Raw or undercooked beef, especially ground beef, or other raw or undercooked meat and poultry Fatty, fried, or spicy foods immediately before or after treatment.  These can sit heavy on your stomach and make you feel nauseous. Raw or undercooked shellfish, such as oysters. Sushi and sashimi, which often contain raw fish.  Unpasteurized beverages, such as unpasteurized fruit juices, raw milk, raw yogurt, or cider Undercooked eggs, such as soft boiled, over easy, and poached; raw, unpasteurized eggs; or foods made with raw egg, such as homemade raw cookie dough and homemade mayonnaise  Simple steps for food safety  Shop smart. Do not buy food stored or displayed in an unclean area. Do not buy bruised or damaged fruits or vegetables. Do not buy cans that have cracks, dents, or bulges. Pick up foods that can spoil at the end of your shopping trip and store them in a cooler on the way home.  Prepare and clean up foods carefully. Rinse all fresh fruits and vegetables under running water , and dry them with a clean towel or paper towel. Clean the top of cans before opening them. After preparing food, wash your hands for 20 seconds  with hot water  and soap. Pay special attention to areas between fingers and under nails. Clean your utensils and dishes with hot water  and soap. Disinfect your kitchen and cutting boards using 1 teaspoon of liquid, unscented bleach mixed into 1 quart of water .    Dispose of old food. Eat canned and packaged food before its expiration date (the "use by" or "best before" date). Consume refrigerated leftovers within 3 to 4 days. After that time, throw out the food. Even if the food does not smell or look spoiled, it still may be unsafe. Some bacteria, such as Listeria, can grow even on foods stored in the refrigerator if they are kept for too long.  Take precautions when eating out. At restaurants, avoid buffets and salad bars where food sits out for a long time and comes in contact with many people. Food can become contaminated when someone with a virus, often a norovirus, or another "bug" handles it. Put any leftover food in a "to-go" container yourself, rather than having the server do it. And, refrigerate leftovers as soon as you get home. Choose restaurants that are clean and that are willing to prepare your food as you order it cooked.    SYMPTOMS TO REPORT  AS SOON AS POSSIBLE AFTER TREATMENT:  FEVER GREATER THAN 100.4 F CHILLS WITH OR WITHOUT FEVER NAUSEA AND VOMITING THAT IS NOT CONTROLLED WITH YOUR NAUSEA MEDICATION UNUSUAL SHORTNESS OF BREATH UNUSUAL BRUISING OR BLEEDING TENDERNESS IN MOUTH AND THROAT WITH OR WITHOUT PRESENCE OF ULCERS URINARY PROBLEMS BOWEL PROBLEMS UNUSUAL RASH     Wear comfortable clothing and clothing appropriate for easy access to any Portacath or PICC line. Let us  know if there is anything that we can do to make your therapy better!   What to do if you need assistance after hours or on the weekends: CALL [phone].  HOLD on the line, do not hang up.  You will hear multiple messages but at the end you will be connected with a nurse triage line.  They  will contact the doctor if necessary.  Most of the time they will be able to assist you.  Do not call the hospital operator.    I have been informed and understand all of the instructions given to me and have received a copy. I have been instructed to call the clinic [phone] or my family physician as soon as possible for continued medical care, if indicated. I do not have any more questions at this time but understand that I may call the Cancer Center or the Patient Navigator at [phone] during office hours should I have questions or need assistance in obtaining follow-up care.

## 2024-04-23 NOTE — Progress Notes (Signed)
 PROGRESS NOTE   Cody Palmer  FMW:[id number] DOB: Feb 18, 1933 DOA: 04/10/2024 PCP: [name], NP   Chief Complaint  Patient presents with   Weakness   Level of care: Med-Surg  Brief Admission History:  Cody Palmer is a 89 year old male with stage IIIa CKD, hypertension, hyperlipidemia, type 2 diabetes mellitus, controlled, recently hospitalized for abdominal distention and concern for colitis versus lymphoma.  He was discharged 4 days ago to home.  He returns today due to poor oral intake, almost no appetite, poor fluid consumption and failure to thrive.  He has become increasingly more weak.  He was seen by GI and had colonoscopy on 04/05/2024 with findings of polyps but otherwise pretty normal.  He also had been seen by oncology during last hospital stay and arrangements made for outpatient PET scan on 04/18/2024 and then will proceed with a lymph node biopsy with IR.  Patient has been having nausea and taking Zofran , abdominal distention persist, poor oral intake persists.  He has been having loose diarrheal stools.  Today he is noted to have another AKI and started on IV fluid hydration.  Admission was requested for further management.    Assessment and Plan:  AKI on CKD stage 3a/hyperkalemia/lymphoma. -- due to poor oral intake and diarrhea -- initially started to improve but now trending back up --CT scan of the abdomen has been ordered as recommended by nephrology. -- Continue to follow BMP as creatinine trends down -- Continue avoiding nephrotoxins - Patient's potassium level down to 3.2.  Lokelma  has been discontinued for now.  -Gentle potassium repletion will be provided. - Creatinine has worsened up to 1.51; patient's GFR 44. -Patient BUN 84. -Continue to follow nephrology service recommendation. -Stopping IV fluids, providing treatment with Lasix . - Continue to maintain adequate hydration; continue the use of Lasix  as per nephrology service  recommendation.  Nephrology service will hold off on placing dialysis catheter at the moment as patient renal function has improved and so far electrolytes are stable. -No acute indication for dialysis appreciated. -Emergent to continue assisting possible TLS will continue the use of allopurinol .   Essential hypertension  -- stable, resume home meds as able -- resume home labetalol  at reduced dose of 100 mg BID with hold parameters. -Continue to hold the rest of his antihypertensive agents. -Follow vital signs.   Controlled type 2 DM  -- sensitive SSI coverage ordered  CBG (last 3)  Recent Labs    04/22/24 2009 04/23/24 0722 04/23/24 1109  GLUCAP 191* 92 121*   Generalized Weakness -- secondary to metabolic derangements  -- encourage ambulation and physical interaction  --might need significant assistance at discharge   Adult failure to thrive -- he remains weak, failed recent discharge home -- Appreciate assistance and recommendation by dietitian regarding feeding supplements. -- hopefully he improves with supportive measures --Follow recommendations by oncology service Megace  has been started. -- Continue to follow clinical response   Abdominal distension Question of abdominal lymphoma -- ongoing outpatient work up  -- he is to have a PET scan outpatient on 12/4 -- Oncology service has been consulted. -- he is to have IR lymph node biopsy after PET scan - acute abdomen series demonstrating not SBO - CT scan demonstrated incisional loop dilations without SBO; moderate ascites. -S/P paracentesis (2.2L removed) on 04/17/2024 -IV albumin  has been ordered. - Core biopsy has been been performed without complications - Continue to follow uric acid, LDH and complete metabolic panel. - Per oncology service rasburicase   x 1 given on 04/17/2024 and patient has been started on prednisone  60 mg twice a day. 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 - Follow recommendations by oncology service steroid tapering this will be started, and plan is for PET scan and chemotherapy as an outpatient.  Rituximab will be initiated after discharge.  Gram negative rods peritonitis  - Patient's ascitic fluid examination demonstrating the presence of gram-negative rods in his aerobic and anaerobic cultures. - Ciprofloxacin  daily (dose adjusted for renal function) has been started - Patient is afebrile; but since initiation of his steroids and increased risk for systemic infection. - Adequate antibiotic with the use of ciprofloxacin  as per sensitivity results.   DVT prophylaxis: heparin  Code Status: DNR  Family Communication: daughter bedside updated Disposition: anticipate home when stabilized    Consultants:  Nephrology service, oncology service, interventional radiology and gastroenterology.  Procedures:  See below for x-ray reports.  Antimicrobials:  Ciprofloxacin .   Subjective: No overnight events; denies chest pain, no nausea, no vomiting.  Objective: Vitals:   04/22/24 1238 04/22/24 2007 04/22/24 2129 04/23/24 0353  BP: (!) 121/49 (!) 147/55 (!) 147/55 133/62  Pulse: 70 75 75 66  Resp:  20  18  Temp: 98.5 F (36.9 C) 97.8 F (36.6 C)  98.2 F (36.8 C)  TempSrc: Oral Oral  Oral  SpO2: 91% 92%  91%  Weight:      Height:        Intake/Output Summary (Last 24 hours) at 04/23/2024 1622 Last data filed at 04/23/2024 1208 Gross per 24 hour  Intake 3008.36 ml  Output 1600 ml  Net 1408.36 ml   Filed Weights   04/10/24 1006 04/10/24 1357  Weight: 92.4 kg 93.4 kg   Examination: General exam: Alert, awake, oriented x 3; no overnight events.  In no acute distress. Respiratory system: No wheezing or crackles on exam; reports feeling slightly short of breath in the setting of abdominal distention. Cardiovascular system:RRR.  No rub, no gallops, no JVD. Gastrointestinal system: Abdomen is once again  distended in the setting of most likely ascites component.  No guarding, positive bowel sounds. Central nervous system: Generally weak.  No focal neurological deficits. Extremities: No cyanosis or clubbing; 1+ edema bilaterally. Skin: No petechiae. Psychiatry: Judgement and insight appear normal.  Flat affect.  Data Reviewed: I have personally reviewed following labs and imaging studies  CBC: Recent Labs  Lab 04/17/24 0500 04/21/24 0438  WBC 7.0 5.8  HGB 8.2* 7.6*  HCT 25.6* 24.6*  MCV 85.6 87.9  PLT 144* 190    Basic Metabolic Panel: Recent Labs  Lab 04/19/24 0456 04/19/24 1759 04/20/24 0800 04/20/24 1842 04/21/24 0438 04/22/24 0422 04/23/24 0525  NA 137   < > 140 139 140 143 144  K 4.7   < > 4.1 3.8 3.5 3.5 3.2*  CL 96*   < > 96* 94* 94* 95* 95*  CO2 22   < > 36* 29 30 34* 42*  GLUCOSE 133*   < > 108* 173* 112* 102* 104*  BUN 84*   < > 84* 82* 81* 69* 64*  CREATININE 2.83*   < > 2.39* 2.31* 2.08* 1.76* 1.51*  CALCIUM  7.5*   < > 7.7* 7.4* 7.5* 7.5* 7.7*  PHOS 7.2*  --  5.7*  --  5.9* 5.7* 3.9   < > = values in this interval not displayed.    CBG: Recent Labs  Lab 04/22/24 1117  04/22/24 1614 04/22/24 2009 04/23/24 0722 04/23/24 1109  GLUCAP 122* 163* 191* 92 121*    Recent Results (from the past 240 hours)  Gram stain     Status: None   Collection Time: 04/17/24  9:28 AM   Specimen: Ascitic  Result Value Ref Range Status   Specimen Description ASCITIC  Final   Special Requests ASCITIC  Final   Gram Stain   Final    WBC PRESENT,BOTH PMN AND MONONUCLEAR NO ORGANISMS SEEN CYTOSPIN SMEAR Performed at [facility], [address] [place], [place] [address]    Report Status 04/17/2024 FINAL  Final  Culture, body fluid w Gram Stain-bottle     Status: Abnormal   Collection Time: 04/17/24  9:28 AM   Specimen: Ascitic  Result Value Ref Range Status   Specimen Description   Final    ASCITIC Performed at [facility], [address] [place], [place]  [address]    Special Requests   Final    BOTTLES DRAWN AEROBIC AND ANAEROBIC Blood Culture adequate volume Performed at [facility], [address], [place], [place] [address]    Gram Stain   Final    IN BOTH AEROBIC AND ANAEROBIC BOTTLES GRAM NEGATIVE RODS Gram Stain Report Called to,Read Back By and Verified WithBETHA LEOTIS SLICKER @ 1913 ON 04/17/24 C VARNER Performed at [facility], [address], [place], [place] [address]    Culture ESCHERICHIA COLI (A)  Final   Report Status 04/19/2024 FINAL  Final   Organism ID, Bacteria ESCHERICHIA COLI  Final      Susceptibility   Escherichia coli - MIC*    AMPICILLIN <=2 SENSITIVE Sensitive     CEFAZOLIN  (NON-URINE) <=1 SENSITIVE Sensitive     CEFEPIME <=0.12 SENSITIVE Sensitive     ERTAPENEM <=0.12 SENSITIVE Sensitive     CEFTRIAXONE <=0.25 SENSITIVE Sensitive     CIPROFLOXACIN  <=0.06 SENSITIVE Sensitive     GENTAMICIN <=1 SENSITIVE Sensitive     MEROPENEM <=0.25 SENSITIVE Sensitive     TRIMETH/SULFA <=20 SENSITIVE Sensitive     AMPICILLIN/SULBACTAM <=2 SENSITIVE Sensitive     PIP/TAZO Value in next row Sensitive      <=4 SENSITIVEThis is a modified FDA-approved test that has been validated and its performance characteristics determined by the reporting laboratory.  This laboratory is certified under the Clinical Laboratory Improvement Amendments CLIA as qualified to perform high complexity clinical laboratory testing.    * ESCHERICHIA COLI     Radiology Studies: No results found.   Scheduled Meds:  allopurinol   150 mg Oral Daily   aspirin  EC  81 mg Oral Daily   Chlorhexidine  Gluconate Cloth  6 each Topical Daily   Chlorhexidine  Gluconate Cloth  6 each Topical Q0600   feeding supplement  1 Container Oral BID BM   fluticasone   2 spray Each Nare Daily   furosemide   40 mg Intravenous Daily   heparin   5,000 Units Subcutaneous Q8H   insulin  aspart  0-9 Units Subcutaneous TID WC   labetalol   100 mg Oral BID   loratadine   10 mg Oral  Daily   megestrol   40 mg Oral Daily   melatonin  3 mg Oral QHS   multivitamin with minerals  1 tablet Oral Daily   pantoprazole   40 mg Oral QPM   potassium chloride   40 mEq Oral Once   simethicone   80 mg Oral QID   Continuous Infusions:  anticoagulant sodium citrate      ciprofloxacin  400 mg (04/23/24 0512)  LOS: 13 days   Time spent: 50 mins  Eric Nunnery, MD How to contact the [facility] Attending or Consulting provider 7A - 7P or covering provider during after hours 7P -7A, for this patient?  Check the care team in [facility] and look for a) attending/consulting TRH provider listed and b) the TRH team listed Log into [email or web address] to find provider on call.  Locate the TRH provider you are looking for under [facility] and page to a number that you can be directly reached. If you still have difficulty reaching the provider, please page the [facility] (Director on Call) for the Hospitalists listed on amion for assistance.  04/23/2024, 4:22 PM

## 2024-04-23 NOTE — Progress Notes (Signed)
 Mobility Specialist Progress Note:    04/23/24 1110  Mobility  Activity Ambulated with assistance  Level of Assistance Standby assist, set-up cues, supervision of patient - no hands on  Assistive Device Front wheel walker  Distance Ambulated (ft) 200 ft  Range of Motion/Exercises Active;All extremities  Activity Response Tolerated well  Mobility Referral Yes  Mobility visit 1 Mobility  Mobility Specialist Start Time (ACUTE ONLY) 1110  Mobility Specialist Stop Time (ACUTE ONLY) 1130  Mobility Specialist Time Calculation (min) (ACUTE ONLY) 20 min   Pt received in chair, family in room. Agreeable to mobility, required SBA to stand and ambulate with RW. Tolerated well, SpO2 96% on 2L EOS. Returned to chair, all needs met.  Angelynn Lemus Mobility Specialist Please contact via Special Educational Needs Teacher or  Rehab office at [phone]

## 2024-04-23 NOTE — Progress Notes (Addendum)
 Cody Palmer   DOB:Sep 20, 1932   FM#:[id number]   RDW#:[id number]  Hem/onc follow up  Subjective: Patient was sitting in bed and eating breakfast when I saw him this morning, his appetite has improved, he was also able to ambulate with physical therapist in the hallway yesterday.  His abdomen is more bloated, he denies significant pain or other new complaints.   Objective:  Vitals:   04/22/24 2129 04/23/24 0353  BP: (!) 147/55 133/62  Pulse: 75 66  Resp:  18  Temp:  98.2 F (36.8 C)  SpO2:  91%    Body mass index is 30.42 kg/m.  Intake/Output Summary (Last 24 hours) at 04/23/2024 0912 Last data filed at 04/23/2024 0600 Gross per 24 hour  Intake 3008.36 ml  Output 1800 ml  Net 1208.36 ml     Sclerae unicteric  Oropharynx clear  No peripheral adenopathy  Lungs clear -- no rales or rhonchi  Heart regular rate and rhythm  Abdomen distended, (+) ascites  MSK no focal spinal tenderness, no peripheral edema  Neuro nonfocal    CBG (last 3)  Recent Labs    04/22/24 1614 04/22/24 2009 04/23/24 0722  GLUCAP 163* 191* 92     Labs:  Lab Results  Component Value Date   WBC 5.8 04/21/2024   HGB 7.6 (L) 04/21/2024   HCT 24.6 (L) 04/21/2024   MCV 87.9 04/21/2024   PLT 190 04/21/2024   NEUTROABS 2.9 04/10/2024     Urine Studies No results for input(s): UHGB, CRYS in the last 72 hours.  Invalid input(s): UACOL, UAPR, USPG, UPH, UTP, UGL, UKET, UBIL, UNIT, UROB, ULEU, UEPI, UWBC, URBC, UBAC, [place], [place], [place]  Basic Metabolic Panel: Recent Labs  Lab 04/18/24 0510 04/18/24 1836 04/19/24 0456 04/19/24 1759 04/20/24 0800 04/20/24 1842 04/21/24 0438 04/22/24 0422 04/23/24 0525  NA 137   < > 137   < > 140 139 140 143 144  K 4.8   < > 4.7   < > 4.1 3.8 3.5 3.5 3.2*  CL 98   < > 96*   < > 96* 94* 94* 95* 95*  CO2 20*   < > 22   < > 36* 29 30 34* 42*  GLUCOSE 111*   < > 133*   < > 108* 173* 112* 102* 104*  BUN 76*   < > 84*    < > 84* 82* 81* 69* 64*  CREATININE 3.06*   < > 2.83*   < > 2.39* 2.31* 2.08* 1.76* 1.51*  CALCIUM  8.0*   < > 7.5*   < > 7.7* 7.4* 7.5* 7.5* 7.7*  PHOS 6.9*  --  7.2*  --  5.7*  --  5.9* 5.7*  --    < > = values in this interval not displayed.   GFR Estimated Creatinine Clearance: 37.4 mL/min (A) (by C-G formula based on SCr of 1.51 mg/dL (H)). Liver Function Tests: Recent Labs  Lab 04/20/24 0800 04/20/24 1842 04/21/24 0438 04/22/24 0422 04/23/24 0525  AST 33 35 34 148* 60*  ALT 17 18 17  87* 57*  ALKPHOS 70 73 66 244* 184*  BILITOT 0.3 0.3 0.3 0.4 0.4  PROT 5.2* 5.2* 5.0* 4.9* 4.7*  ALBUMIN  3.1* 3.1* 3.0* 3.0* 3.1*   No results for input(s): LIPASE, AMYLASE in the last 168 hours. No results for input(s): AMMONIA in the last 168 hours. Coagulation profile No results for input(s): INR, PROTIME in the last 168 hours.  CBC: Recent Labs  Lab  04/17/24 0500 04/21/24 0438  WBC 7.0 5.8  HGB 8.2* 7.6*  HCT 25.6* 24.6*  MCV 85.6 87.9  PLT 144* 190   Cardiac Enzymes: No results for input(s): CKTOTAL, CKMB, CKMBINDEX, TROPONINI in the last 168 hours. BNP: Invalid input(s): POCBNP CBG: Recent Labs  Lab 04/22/24 0742 04/22/24 1117 04/22/24 1614 04/22/24 2009 04/23/24 0722  GLUCAP 107* 122* 163* 191* 92   D-Dimer No results for input(s): DDIMER in the last 72 hours. Hgb A1c No results for input(s): HGBA1C in the last 72 hours. Lipid Profile No results for input(s): CHOL, HDL, LDLCALC, TRIG, CHOLHDL, LDLDIRECT in the last 72 hours. Thyroid function studies No results for input(s): TSH, T4TOTAL, T3FREE, THYROIDAB in the last 72 hours.  Invalid input(s): FREET3 Anemia work up No results for input(s): VITAMINB12, FOLATE, FERRITIN, TIBC, IRON, RETICCTPCT in the last 72 hours. Microbiology Recent Results (from the past 240 hours)  Gram stain     Status: None   Collection Time: 04/17/24  9:28 AM   Specimen:  Ascitic  Result Value Ref Range Status   Specimen Description ASCITIC  Final   Special Requests ASCITIC  Final   Gram Stain   Final    WBC PRESENT,BOTH PMN AND MONONUCLEAR NO ORGANISMS SEEN CYTOSPIN SMEAR Performed at [facility], [address], [place], [place] [address]    Report Status 04/17/2024 FINAL  Final  Culture, body fluid w Gram Stain-bottle     Status: Abnormal   Collection Time: 04/17/24  9:28 AM   Specimen: Ascitic  Result Value Ref Range Status   Specimen Description   Final    ASCITIC Performed at [facility], [address], [place], [place] [address]    Special Requests   Final    BOTTLES DRAWN AEROBIC AND ANAEROBIC Blood Culture adequate volume Performed at [facility], [address], [place], [place] [address]    Gram Stain   Final    IN BOTH AEROBIC AND ANAEROBIC BOTTLES GRAM NEGATIVE RODS Gram Stain Report Called to,Read Back By and Verified WithBETHA LEOTIS SLICKER @ 1913 ON 04/17/24 C VARNER Performed at [facility], [address], [place], [place] [address]    Culture ESCHERICHIA COLI (A)  Final   Report Status 04/19/2024 FINAL  Final   Organism ID, Bacteria ESCHERICHIA COLI  Final      Susceptibility   Escherichia coli - MIC*    AMPICILLIN <=2 SENSITIVE Sensitive     CEFAZOLIN  (NON-URINE) <=1 SENSITIVE Sensitive     CEFEPIME <=0.12 SENSITIVE Sensitive     ERTAPENEM <=0.12 SENSITIVE Sensitive     CEFTRIAXONE  <=0.25 SENSITIVE Sensitive     CIPROFLOXACIN  <=0.06 SENSITIVE Sensitive     GENTAMICIN <=1 SENSITIVE Sensitive     MEROPENEM <=0.25 SENSITIVE Sensitive     TRIMETH/SULFA <=20 SENSITIVE Sensitive     AMPICILLIN/SULBACTAM <=2 SENSITIVE Sensitive     PIP/TAZO Value in next row Sensitive      <=4 SENSITIVEThis is a modified FDA-approved test that has been validated and its performance characteristics determined by the reporting laboratory.  This laboratory is certified under the Clinical Laboratory Improvement Amendments CLIA as qualified to  perform high complexity clinical laboratory testing.    * ESCHERICHIA COLI      Studies:  No results found.  Assessment: 89 y.o. 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  No signs of high-grade lymphoma.  His cytology from paracentesis was also positive for B-cell lymphoma - Due to my high clinical suspicion for high-grade lymphoma, I will order a PET scan after hospital discharge - I will also arrange follow-up with my partner Dr. Davonna next week, and the plan to start single agent Rituxan, which can be used for both follicular lymphoma and high-grade lymphoma.  Given his advanced age and overall health condition, I would not use chemotherapy except for high grade lymphoma  -he completed 5 days course of prednisone   - Recommend repeating paracentesis for symptom relief, and repeat culture  - Patient would likely be discharged to rehab.  I do not have any additional oncology workup or treatment planned during his hospital admission. - If repeated hemoglobin less than 7.5 tomorrow morning, please consider 1 unit of blood transfusion -I spoke with his son-in law at bedside, and also reviewed the above with granddaughter in law Lauren, who is a chemo nurse working in our - I spent a total of 50 minutes for his visit today   Onita Mattock, MD 04/23/2024  9:12 AM

## 2024-04-23 NOTE — Progress Notes (Signed)
 Nurse at bedside pt asleep, daughter in room. No questions or concerns were asked at this time.

## 2024-04-24 ENCOUNTER — Inpatient Hospital Stay (HOSPITAL_COMMUNITY)

## 2024-04-24 ENCOUNTER — Encounter (HOSPITAL_COMMUNITY): Payer: Self-pay | Admitting: Family Medicine

## 2024-04-24 ENCOUNTER — Other Ambulatory Visit: Payer: Self-pay

## 2024-04-24 DIAGNOSIS — C8299 Follicular lymphoma, unspecified, extranodal and solid organ sites: Secondary | ICD-10-CM

## 2024-04-24 DIAGNOSIS — E440 Moderate protein-calorie malnutrition: Secondary | ICD-10-CM

## 2024-04-24 LAB — CBC
HCT: 29.9 % — ABNORMAL LOW (ref 39.0–52.0)
Hemoglobin: 9.2 g/dL — ABNORMAL LOW (ref 13.0–17.0)
MCH: 27.8 pg (ref 26.0–34.0)
MCHC: 30.8 g/dL (ref 30.0–36.0)
MCV: 90.3 fL (ref 80.0–100.0)
Platelets: 213 K/uL (ref 150–400)
RBC: 3.31 MIL/uL — ABNORMAL LOW (ref 4.22–5.81)
RDW: 18.4 % — ABNORMAL HIGH (ref 11.5–15.5)
WBC: 7.7 K/uL (ref 4.0–10.5)
nRBC: 0 % (ref 0.0–0.2)

## 2024-04-24 LAB — COMPREHENSIVE METABOLIC PANEL WITH GFR
ALT: 40 U/L (ref 0–44)
AST: 47 U/L — ABNORMAL HIGH (ref 15–41)
Albumin: 3 g/dL — ABNORMAL LOW (ref 3.5–5.0)
Alkaline Phosphatase: 152 U/L — ABNORMAL HIGH (ref 38–126)
Anion gap: 6 (ref 5–15)
BUN: 51 mg/dL — ABNORMAL HIGH (ref 8–23)
CO2: 41 mmol/L — ABNORMAL HIGH (ref 22–32)
Calcium: 7.8 mg/dL — ABNORMAL LOW (ref 8.9–10.3)
Chloride: 97 mmol/L — ABNORMAL LOW (ref 98–111)
Creatinine, Ser: 1.27 mg/dL — ABNORMAL HIGH (ref 0.61–1.24)
GFR, Estimated: 54 mL/min — ABNORMAL LOW (ref >60)
Glucose, Bld: 93 mg/dL (ref 70–99)
Potassium: 3.3 mmol/L — ABNORMAL LOW (ref 3.5–5.1)
Sodium: 144 mmol/L (ref 135–145)
Total Bilirubin: 0.4 mg/dL (ref 0.0–1.2)
Total Protein: 4.6 g/dL — ABNORMAL LOW (ref 6.5–8.1)

## 2024-04-24 LAB — LACTATE DEHYDROGENASE: LDH: 996 U/L — ABNORMAL HIGH (ref 105–235)

## 2024-04-24 LAB — GLUCOSE, CAPILLARY
Glucose-Capillary: 101 mg/dL — ABNORMAL HIGH (ref 70–99)
Glucose-Capillary: 128 mg/dL — ABNORMAL HIGH (ref 70–99)

## 2024-04-24 LAB — PHOSPHORUS: Phosphorus: 2.9 mg/dL (ref 2.5–4.6)

## 2024-04-24 MED ORDER — ACETAZOLAMIDE SODIUM 500 MG IJ SOLR
250.0000 mg | Freq: Once | INTRAMUSCULAR | Status: DC
  Filled 2024-04-24: qty 500

## 2024-04-24 MED ORDER — OXYCODONE HCL 5 MG PO TABS: 5.0000 mg | ORAL_TABLET | Freq: Four times a day (QID) | ORAL | 0 refills | Status: DC | PRN

## 2024-04-24 MED ORDER — LIDOCAINE HCL (PF) 2 % IJ SOLN
INTRAMUSCULAR | Status: AC
  Filled 2024-04-24: qty 10

## 2024-04-24 MED ORDER — LIDOCAINE HCL (PF) 2 % IJ SOLN
10.0000 mL | Freq: Once | INTRAMUSCULAR | Status: AC
  Administered 2024-04-24: 10 mL

## 2024-04-24 MED ORDER — POTASSIUM CHLORIDE CRYS ER 20 MEQ PO TBCR
40.0000 meq | EXTENDED_RELEASE_TABLET | Freq: Once | ORAL | Status: AC
  Administered 2024-04-24: 40 meq via ORAL
  Filled 2024-04-24: qty 2

## 2024-04-24 MED ORDER — MEGESTROL ACETATE 40 MG PO TABS: 40.0000 mg | ORAL_TABLET | Freq: Every day | ORAL | Status: DC

## 2024-04-24 MED ORDER — FLUTICASONE PROPIONATE 50 MCG/ACT NA SUSP: 2.0000 | Freq: Every day | NASAL | Status: AC

## 2024-04-24 MED ORDER — CIPROFLOXACIN HCL 500 MG PO TABS: 500.0000 mg | ORAL_TABLET | Freq: Two times a day (BID) | ORAL | Status: DC

## 2024-04-24 MED ORDER — LABETALOL HCL 100 MG PO TABS: 100.0000 mg | ORAL_TABLET | Freq: Two times a day (BID) | ORAL | Status: DC

## 2024-04-24 MED ORDER — GLUCERNA 1.0 CAL/FIBER PO LIQD: 1.0000 | Freq: Two times a day (BID) | ORAL | Status: DC

## 2024-04-24 MED ORDER — ALLOPURINOL 300 MG PO TABS: 150.0000 mg | ORAL_TABLET | Freq: Every day | ORAL | Status: DC

## 2024-04-24 MED ORDER — CIPROFLOXACIN IN D5W 400 MG/200ML IV SOLN: 400.0000 mg | Freq: Two times a day (BID) | INTRAVENOUS | Status: DC

## 2024-04-24 MED ORDER — SITAGLIPTIN 25 MG PO TABS: 12.5000 mg | ORAL_TABLET | Freq: Every day | ORAL | Status: DC

## 2024-04-24 MED ORDER — LORATADINE 10 MG PO TABS: 10.0000 mg | ORAL_TABLET | Freq: Every day | ORAL | Status: AC

## 2024-04-24 MED ORDER — FUROSEMIDE 40 MG PO TABS: 40.0000 mg | ORAL_TABLET | Freq: Every day | ORAL | Status: DC

## 2024-04-24 NOTE — Plan of Care (Signed)

## 2024-04-24 NOTE — Care Management Important Message (Signed)
 Important Message  Patient Details  Name: Cody Palmer MRN: [id number] Date of Birth: 25-Nov-1932   Important Message Given:  Yes - Medicare IM     [name] L Yordan Martindale 04/24/2024, 2:44 PM

## 2024-04-24 NOTE — Progress Notes (Signed)
 Pt has arrived back on the unit from his procedure. No questions or concerns at this time.

## 2024-04-24 NOTE — Procedures (Signed)
 PROCEDURE SUMMARY:  Successful image-guided paracentesis from the right lower abdomen.  Yielded 4.0 liters of serosanguineous fluid.  No immediate complications.  EBL < 1 mL Patient tolerated well.   Specimen was not sent for labs.  Please see imaging section of Epic for full dictation.  Clotilda DELENA Hesselbach PA-C 04/24/2024 1:45 PM

## 2024-04-24 NOTE — Progress Notes (Signed)
 Report has been given to receiving facility [facility]. Patient is going to go through tunnel to room 135

## 2024-04-24 NOTE — Progress Notes (Signed)
 Mobility Specialist Progress Note:    04/24/24 1420  Mobility  Activity Ambulated with assistance  Level of Assistance Contact guard assist, steadying assist  Assistive Device Other (Comment) (1 HHA)  Distance Ambulated (ft) 15 ft  Range of Motion/Exercises Active;All extremities  Activity Response Tolerated well  Mobility Referral Yes  Mobility visit 1 Mobility  Mobility Specialist Start Time (ACUTE ONLY) 1420  Mobility Specialist Stop Time (ACUTE ONLY) 1440  Mobility Specialist Time Calculation (min) (ACUTE ONLY) 20 min   Pt received sitting EOB, requesting assistance to bathroom. Required CGA to stand and ambulate with 1 hand-held assist. Tolerated well, assisted with peri care and returned supine. Family in room, all needs met.  Linn Goetze Mobility Specialist Please contact via Special Educational Needs Teacher or  Rehab office at [phone]

## 2024-04-24 NOTE — Progress Notes (Signed)
 Physical Therapy Treatment Patient Details Name: Cody Palmer MRN: [id number] DOB: 1933/03/02 Today's Date: 04/24/2024   History of Present Illness Cody Palmer is a 89 year old male with stage IIIa CKD, hypertension, hyperlipidemia, type 2 diabetes mellitus, controlled, recently hospitalized for abdominal distention and concern for colitis versus lymphoma.  He was discharged 4 days ago to home.  He returns today due to poor oral intake, almost no appetite, poor fluid consumption and failure to thrive.  He has become increasingly more weak.  He was seen by GI and had colonoscopy on 04/05/2024 with findings of polyps but otherwise pretty normal.  He also had been seen by oncology during last hospital stay and arrangements made for outpatient PET scan on 04/18/2024 and then will proceed with a lymph node biopsy with IR.  Patient has been having nausea and taking Zofran , abdominal distention persist, poor oral intake persists.  He has been having loose diarrheal stools.  Today he is noted to have another AKI and started on IV fluid hydration.  Admission was requested for further management.    PT Comments  Patient presents seated in chair (assisted by nursing staff) and agreeable for therapy. Patient demonstrates fair/good return for completing BLE ROM/strengthening exercises, but requires frequent rest break due to SOB, fatigue, tolerated ambulation in room, hallway without loss of balance while on room air with SpO2 at 93% and limited for gait training mostly due to c/o fatigue and generalized weakness. Patient tolerated staying up in chair after therapy. Patient will benefit from continued skilled physical therapy in hospital and recommended venue below to increase strength, balance, endurance for safe ADLs and gait.       If plan is discharge home, recommend the following: A little help with walking and/or transfers;A lot of help with walking and/or transfers;A little help with  bathing/dressing/bathroom;Help with stairs or ramp for entrance;Assistance with cooking/housework;Assist for transportation   Can travel by private vehicle     Yes  Equipment Recommendations  None recommended by PT    Recommendations for Other Services       Precautions / Restrictions Precautions Precautions: Fall Recall of Precautions/Restrictions: Intact Restrictions Weight Bearing Restrictions Per Provider Order: No     Mobility  Bed Mobility Overal bed mobility: Needs Assistance Bed Mobility: Sit to Supine     Supine to sit: Min assist, Mod assist     General bed mobility comments: increase time, labored movement with HOB flat    Transfers Overall transfer level: Needs assistance Equipment used: Rolling walker (2 wheels) Transfers: Sit to/from Stand, Bed to chair/wheelchair/BSC Sit to Stand: Contact guard assist, Min assist   Step pivot transfers: Contact guard assist, Min assist       General transfer comment: slightly labored movemen with fair/good return for completing sit to stands from chair/bedside    Ambulation/Gait Ambulation/Gait assistance: Contact guard assist, Min assist Gait Distance (Feet): 50 Feet Assistive device: Rolling walker (2 wheels) Gait Pattern/deviations: Decreased step length - right, Decreased step length - left, Decreased stride length, Drifts right/left Gait velocity: decreased     General Gait Details: slow slightly labored movement without loss of balance, on room air with SpO2 at 93%, limited mostly due to c/o fatigue and generalized weakness   Stairs             Wheelchair Mobility     Tilt Bed    Modified Rankin (Stroke Patients Only)       Balance Overall balance assessment: Needs assistance Sitting-balance support:  Feet supported, No upper extremity supported Sitting balance-Leahy Scale: Good Sitting balance - Comments: seated at EOB   Standing balance support: During functional activity, Bilateral  upper extremity supported Standing balance-Leahy Scale: Fair Standing balance comment: fair/good using RW                            Communication Communication Communication: No apparent difficulties  Cognition Arousal: Alert Behavior During Therapy: WFL for tasks assessed/performed   PT - Cognitive impairments: No apparent impairments                         Following commands: Intact      Cueing Cueing Techniques: Verbal cues  Exercises General Exercises - Lower Extremity Long Arc Quad: Seated, AROM, Strengthening, Both, 10 reps Hip Flexion/Marching: Seated, AROM, Strengthening, Both, 10 reps Toe Raises: Seated, AROM, Strengthening, 10 reps, Both Heel Raises: Seated, AROM, Strengthening, 10 reps, Both    General Comments        Pertinent Vitals/Pain Pain Assessment Pain Assessment: No/denies pain    Home Living                          Prior Function            PT Goals (current goals can now be found in the care plan section) Acute Rehab PT Goals Patient Stated Goal: return home with family to assist PT Goal Formulation: With patient Time For Goal Achievement: 04/27/24 Potential to Achieve Goals: Good Progress towards PT goals: Progressing toward goals    Frequency    Min 3X/week      PT Plan      Co-evaluation              AM-PAC PT 6 Clicks Mobility   Outcome Measure  Help needed turning from your back to your side while in a flat bed without using bedrails?: A Little Help needed moving from lying on your back to sitting on the side of a flat bed without using bedrails?: A Lot Help needed moving to and from a bed to a chair (including a wheelchair)?: A Little Help needed standing up from a chair using your arms (e.g., wheelchair or bedside chair)?: A Little Help needed to walk in hospital room?: A Little Help needed climbing 3-5 steps with a railing? : A Lot 6 Click Score: 16    End of Session    Activity Tolerance: Patient tolerated treatment well;Patient limited by fatigue Patient left: in chair;with call bell/phone within reach Nurse Communication: Mobility status PT Visit Diagnosis: Unsteadiness on feet (R26.81);Other abnormalities of gait and mobility (R26.89);Muscle weakness (generalized) (M62.81)     Time: 9061-8995 PT Time Calculation (min) (ACUTE ONLY): 26 min  Charges:    $Gait Training: 8-22 mins $Therapeutic Exercise: 8-22 mins PT General Charges $$ ACUTE PT VISIT: 1 Visit                     10:18 AM, 04/24/24 Cody Palmer, MPT Physical Therapist with [facility] 336 [phone] office [phone] mobile phone

## 2024-04-24 NOTE — Progress Notes (Signed)
 Mobility Specialist Progress Note:    04/24/24 0940  Mobility  Activity Ambulated with assistance  Level of Assistance Contact guard assist, steadying assist  Assistive Device Front wheel walker  Distance Ambulated (ft) 40 ft  Range of Motion/Exercises Active;All extremities  Activity Response Tolerated well  Mobility Referral Yes  Mobility visit 1 Mobility  Mobility Specialist Start Time (ACUTE ONLY) 0940  Mobility Specialist Stop Time (ACUTE ONLY) 1000  Mobility Specialist Time Calculation (min) (ACUTE ONLY) 20 min   Pt received in chair, co-session with physical therapy. Required MinA for supine to sit and CGA to stand and ambulate with RW. Tolerated well, returned to chair. All needs met.  Vlasta Baskin Mobility Specialist Please contact via Special Educational Needs Teacher or  Rehab office at [phone]

## 2024-04-24 NOTE — Progress Notes (Signed)
 Patient ID: Cody Palmer, male   DOB: [birth year], 89 y.o.   MRN: [id number] [facility] Progress Note   Assessment/ Plan:   1. Acute kidney Injury: Baseline creatinine appears to be 0.9-1.1.  Acute injury suspected to be from significant volume depletion/poor oral intake in the setting of ongoing ARB/thiazide/SGLT2 inhibitor use.  Improving with volume replacement while holding above medications.  Discontinued the  isotonic sodium bicarbonate on 12/9 with his developing metabolic alkalosis/hypokalemia.  No additional intravenous fluids indicated at this time with current volume status, encouraged oral intake. - will give single dose of acetazolamide  today; lungs sound much better than when I saw him last week - from renal standpoint stable for discharge - will need lasix  40mg  daily upon d/c; recommend compression stockings to help mobilize the fluid in his lower extremities.  2.  Metabolic alkalosis/hypokalemia: Iatrogenic with ongoing sodium bicarbonate drip, discontinued on 12/9 -> dose of acetazolamide  today 3.  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  Continue to avoid iodinated intravenous contrast in the setting of acute kidney injury.  Going for paracentesis today; if > 4 liters please give albumin  25gm afterwards. 4.  Anemia: Appears to be secondary to chronic illness +/- lymphoma.  Without overt loss and ongoing workup.  Subjective:   Reports to be feeling better with some tightness in his abdomen but without shortness of breath; eating breakfast this morning with assistance from his SIL   Objective:   BP (!) 119/49 (BP Location: Right Arm)   Pulse 65   Temp 97.6 F (36.4 C) (Oral)   Resp 17   Ht 5' 9 (1.753 m)   Wt 93.4 kg   SpO2 95%   BMI 30.42 kg/m   Intake/Output Summary (Last 24 hours) at 04/24/2024 9093 Last data filed at 04/24/2024 0300 Gross per 24 hour  Intake  --  Output 1400 ml  Net -1400 ml   Weight change:   Physical Exam: Gen: Comfortably sitting on side of bed, family member at bedside assisting with breakfast CVS: Pulse regular rhythm, normal rate, S1 and S2 normal Resp: Diminished breath sounds over bases, but no rales/rhonchi Abd: Firm, moderately distended, mildly tender over lower quadrants Ext: 1-2+ bilateral pitting edema.  Imaging: No results found.  Labs: BMET Recent Labs  Lab 04/18/24 0510 04/18/24 1836 04/19/24 0456 04/19/24 1759 04/20/24 0800 04/20/24 1842 04/21/24 0438 04/22/24 0422 04/23/24 0525 04/24/24 0433  NA 137   < > 137 138 140 139 140 143 144 144  K 4.8   < > 4.7 4.4 4.1 3.8 3.5 3.5 3.2* 3.3*  CL 98   < > 96* 96* 96* 94* 94* 95* 95* 97*  CO2 20*   < > 22 25 36* 29 30 34* 42* 41*  GLUCOSE 111*   < > 133* 173* 108* 173* 112* 102* 104* 93  BUN 76*   < > 84* 84* 84* 82* 81* 69* 64* 51*  CREATININE 3.06*   < > 2.83* 2.62* 2.39* 2.31* 2.08* 1.76* 1.51* 1.27*  CALCIUM  8.0*   < > 7.5* 7.5* 7.7* 7.4* 7.5* 7.5* 7.7* 7.8*  PHOS 6.9*  --  7.2*  --  5.7*  --  5.9* 5.7* 3.9 2.9   < > = values in this interval not displayed.   CBC Recent Labs  Lab 04/21/24 0438 04/24/24 0433  WBC 5.8 7.7  HGB 7.6* 9.2*  HCT 24.6* 29.9*  MCV  87.9 90.3  PLT 190 213    Medications:     allopurinol   150 mg Oral Daily   aspirin  EC  81 mg Oral Daily   Chlorhexidine  Gluconate Cloth  6 each Topical Daily   Chlorhexidine  Gluconate Cloth  6 each Topical Q0600   feeding supplement  1 Container Oral BID BM   fluticasone   2 spray Each Nare Daily   furosemide   40 mg Intravenous Daily   heparin   5,000 Units Subcutaneous Q8H   insulin  aspart  0-9 Units Subcutaneous TID WC   labetalol   100 mg Oral BID   loratadine   10 mg Oral Daily   megestrol   40 mg Oral Daily   melatonin  3 mg Oral QHS   multivitamin with minerals  1 tablet Oral Daily   pantoprazole   40 mg Oral QPM   simethicone   80 mg Oral QID

## 2024-04-24 NOTE — Progress Notes (Signed)
 Patient tolerated right sided Paracentesis procedure well today and 4 Liters of ascites removed. Patient verbalized understanding of post procedure instructions and transported via stretcher back to inpatient bed assignment at this time with no acute distress noted.

## 2024-04-24 NOTE — Discharge Summary (Signed)
 Physician Discharge Summary   Patient: Cody Palmer MRN: [id number] DOB: 03-07-1933  Admit date:     04/10/2024  Discharge date: 04/24/24  Discharge Physician: Eric Nunnery   PCP: [name], NP   Recommendations at discharge:  Repeat basic metabolic panel in 1 week to follow electrolytes and renal function Repeat CBC in 1 week to follow hemoglobin trend/stability Reassess blood pressure and adjust medication as needed Follow recommendations/further instructions as per oncology service.  Discharge Diagnoses: Principal Problem:   AKI (acute kidney injury) Active Problems:   Difficulty walking   S/P total knee replacement   Chronic kidney disease, stage 3a (HCC)   Essential hypertension   Hyperlipidemia   Type 2 diabetes mellitus without complications (HCC)   Abdominal distention   Generalized weakness   Failure to thrive in adult   DNR (do not resuscitate)   Malnutrition of moderate degree   Follicular lymphoma (HCC)  Brief Admission History:  Cody Palmer is a 89 year old male with stage IIIa CKD, hypertension, hyperlipidemia, type 2 diabetes mellitus, controlled, recently hospitalized for abdominal distention and concern for colitis versus lymphoma.  He was discharged 4 days ago to home.  He returns today due to poor oral intake, almost no appetite, poor fluid consumption and failure to thrive.  He has become increasingly more weak.  He was seen by GI and had colonoscopy on 04/05/2024 with findings of polyps but otherwise pretty normal.  He also had been seen by oncology during last hospital stay and arrangements made for outpatient PET scan on 04/18/2024 and then will proceed with a lymph node biopsy with IR.  Patient has been having nausea and taking Zofran , abdominal distention persist, poor oral intake persists.  He has been having loose diarrheal stools.  Today he is noted to have another AKI and started on IV fluid hydration.  Admission was requested for  further management.   Assessment and Plan: AKI on CKD stage 3a/hyperkalemia/lymphoma. -- due to poor oral intake and diarrhea -- initially started to improve but now trending back up --CT scan of the abdomen has been ordered as recommended by nephrology. -- Continue to follow BMP as creatinine trends down -- Continue avoiding nephrotoxins - Patient's potassium level down to 3.2.   -Gentle potassium repletion provided. - Creatinine has worsened up to 1.27; patient's GFR 54. -Patient BUN 51. - Diamox  x 1 provided on the day of discharge and recommendations for 40 mg Lasix  on daily basis. - Continue to follow renal function intermittently - Continue minimizing the use of nephrotoxic agents - Maintain adequate hydration. -No acute indication for dialysis appreciated. - Continue the use of allopurinol . -Once initiating management for lymphoma be careful of any further component of TLS   Essential hypertension  -- stable, resume home meds as able -- resume home labetalol  at reduced dose of 100 mg BID with hold parameters. -Continue to hold the rest of his antihypertensive agents. -Follow vital signs and further adjust medication as needed down the road..   Controlled type 2 DM  -Patient has been discharged on siptagliptin on daily basis - Continue to follow CBG fluctuation and adjust hypoglycemic regimen as needed.  CBG (last 3)  Recent Labs (last 2 labs)       Recent Labs    04/22/24 2009 04/23/24 0722 04/23/24 1109  GLUCAP 191* 92 121*      Generalized Weakness -- secondary to metabolic derangements  -- encourage ambulation and physical interaction  --might need significant assistance at  discharge   Adult failure to thrive -- he remains weak, failed recent discharge home -- Appreciate assistance and recommendation by dietitian regarding feeding supplements. -- hopefully he improves with supportive measures --Following recommendations by oncology service Megace  has been  started. -- Continue to follow clinical response   Abdominal distension Question of abdominal lymphoma -- ongoing outpatient work up  -- he is to have a PET scan outpatient on 12/4 -- Oncology service has been consulted. -- he is to have IR lymph node biopsy after PET scan - acute abdomen series demonstrating not SBO - CT scan demonstrated incisional loop dilations without SBO; moderate ascites. -S/P paracentesis (2.2L removed) on 04/17/2024 -Repeat paracentesis on 04/24/2024 with 4 L removed. -albumin  has been ordered. - Core biopsy has been been performed without complications - Continue to follow uric acid, LDH and complete metabolic panel. - Per oncology service rasburicase  x 1 given on 04/17/2024 and patient has been started on prednisone  60 mg twice a day. - Biopsy results suggesting the presence of follicular B-cell lymphoma/CLL - Continue the use of simethicone . - Follow recommendations by oncology service steroid tapering this will be started, and plan is for PET scan and chemotherapy as an outpatient.  Rituximab will be initiated after discharge.   Gram negative rods peritonitis  - Patient's ascitic fluid examination demonstrating the presence of gram-negative rods in his aerobic and anaerobic cultures. - Ciprofloxacin  daily (dose adjusted for renal function)  - Patient is afebrile; but since initiation of his steroids and increased risk for systemic infection. - Adequate antibiotic with the use of ciprofloxacin  as per sensitivity results. - Planning to transition to oral ciprofloxacin  for 5 more days to complete management.  Consultants: Oncology service, IR-nephrology. Procedures performed: See below for x-ray reports. Disposition: Skilled nursing facility Diet recommendation: Renal/modified carbohydrate diet.  DISCHARGE MEDICATION: Allergies as of 04/24/2024   No Known Allergies      Medication List     STOP taking these medications    amLODipine  10 MG  tablet Commonly known as: NORVASC    Jardiance  25 MG Tabs tablet Generic drug: empagliflozin    losartan -hydrochlorothiazide  100-25 MG tablet Commonly known as: HYZAAR   metFORMIN  500 MG tablet Commonly known as: GLUCOPHAGE    ondansetron  4 MG tablet Commonly known as: Zofran        TAKE these medications    allopurinol  300 MG tablet Commonly known as: ZYLOPRIM  Take 0.5 tablets (150 mg total) by mouth daily. Start taking on: April 25, 2024   aspirin  EC 81 MG tablet Take 81 mg by mouth daily. Swallow whole.   ciprofloxacin  500 MG tablet Commonly known as: Cipro  Take 1 tablet (500 mg total) by mouth 2 (two) times daily for 5 days.   EYE HEALTH AREDS 2 PO Take 1 capsule by mouth 2 (two) times daily.   fluticasone  50 MCG/ACT nasal spray Commonly known as: FLONASE  Place 2 sprays into both nostrils daily. Start taking on: April 25, 2024   furosemide  40 MG tablet Commonly known as: Lasix  Take 1 tablet (40 mg total) by mouth daily.   Glucerna 1.0 Cal/Fiber Liqd Take 1 Bottle by mouth 2 (two) times daily between meals.   labetalol  100 MG tablet Commonly known as: NORMODYNE  Take 1 tablet (100 mg total) by mouth 2 (two) times daily. What changed:  medication strength how much to take   loratadine  10 MG tablet Commonly known as: CLARITIN  Take 1 tablet (10 mg total) by mouth daily. Start taking on: April 25, 2024   megestrol   40 MG tablet Commonly known as: MEGACE  Take 1 tablet (40 mg total) by mouth daily. Start taking on: April 25, 2024   oxyCODONE  5 MG immediate release tablet Commonly known as: Oxy IR/ROXICODONE  Take 1 tablet (5 mg total) by mouth every 6 (six) hours as needed for severe pain (pain score 7-10).   pantoprazole  40 MG tablet Commonly known as: PROTONIX  Take 1 tablet (40 mg total) by mouth daily. 30 minutes before breakfast   polyethylene glycol 17 g packet Commonly known as: MiraLax  Take 17 g by mouth daily as needed for mild  constipation or moderate constipation.   prochlorperazine  10 MG tablet Commonly known as: COMPAZINE  Take 1 tablet (10 mg total) by mouth every 6 (six) hours as needed for nausea or vomiting.   rosuvastatin  10 MG tablet Commonly known as: CRESTOR  Take 10 mg by mouth every other day.   simethicone  80 MG chewable tablet Commonly known as: MYLICON Chew 1 tablet (80 mg total) by mouth every 6 (six) hours as needed for flatulence.   SITagliptin 25 MG Tabs Take 12.5 mg by mouth daily at 6 (six) AM.   Vitamin D3 25 MCG (1000 UT) Caps Take 1,000 Units by mouth daily.        Contact information for follow-up providers     [name], Kalombo Cesar, NP. Schedule an appointment as soon as possible for a visit in 2 week(s).   Why: After discharge from the skilled nursing facility. Contact information: [address] [place] [address] [phone]              Contact information for after-discharge care     Destination     [facility] .   Service: Skilled Nursing Contact information: [address] [place]  [address] [phone]             Home Medical Care     [facility]) .   Service: Home Health Services Contact information: [address] [place]  [address] [phone]                    Discharge Exam: Fredricka Weights   04/10/24 1006 04/10/24 1357  Weight: 92.4 kg 93.4 kg   General exam: Alert, awake, oriented x 3; no overnight events.  In no acute distress. Respiratory system: No wheezing or crackles on exam; reports feeling slightly short of breath in the setting of abdominal distention. Cardiovascular system:RRR.  No rub, no gallops, no JVD. Gastrointestinal system: Abdomen is once again distended in the setting of most likely ascites component.  No guarding, positive bowel sounds. Central nervous system: Generally weak.  No focal neurological  deficits. Extremities: No cyanosis or clubbing; 1+ edema bilaterally. Skin: No petechiae. Psychiatry: Judgement and insight appear normal.  Flat affect.  Condition at discharge: Stable and improved.  The results of significant diagnostics from this hospitalization (including imaging, microbiology, ancillary and laboratory) are listed below for reference.   Imaging Studies: US  CORE BIOPSY (LYMPH NODES) Result Date: 04/17/2024 INDICATION: Left groin lymphadenopathy. EXAM: ULTRASOUND GUIDED core needle  BIOPSY OF left inguinal region MEDICATIONS: None. ANESTHESIA/SEDATION: Fentanyl  1.5 mcg IV; Versed  50 mg IV Moderate Sedation Time:  10 The patient was continuously monitored during the procedure by the interventional radiology nurse under my direct supervision. PROCEDURE: The procedure, risks, benefits, and alternatives were explained to the patient. Questions regarding the procedure were encouraged and answered. The patient understands and consents to  the procedure. The left groin was prepped with chlorhexidine  in a sterile fashion, and a sterile drape was applied covering the operative field. A sterile gown and sterile gloves were used for the procedure. Local anesthesia was provided with 1% Lidocaine . An introducer needle was advanced through a small incision made in the mid aspect of the left groin. The needle was advanced under ultrasound guidance until the needle tip was engaged within the abnormally enlarged lymph node. The stylet was removed and the BioPince needle was then advanced through the introducer needle until the BioPince needle was engaged within the lymph node tissue. A 2.3 cm core sample was obtained. Sample was placed in saline. A second sample was obtained in similar fashion. All needles removed from the patient. Sterile dressing applied. Final image demonstrates no active hemorrhage. COMPLICATIONS: Satisfactory core needle biopsy of left inguinal region lymph node. Electronically  Signed   By: Cordella Banner   On: 04/17/2024 15:24   US  Paracentesis Result Date: 04/17/2024 INDICATION: Patient with enlarging mesenteric mass concerning for lymphoma, new onset ascites. Request for diagnostic and therapeutic paracentesis. EXAM: ULTRASOUND GUIDED DIAGNOSTIC AND THERAPEUTIC PARACENTESIS MEDICATIONS: 6 mL 1% lidocaine  COMPLICATIONS: None immediate. PROCEDURE: Informed written consent was obtained from the patient after a discussion of the risks, benefits and alternatives to treatment. A timeout was performed prior to the initiation of the procedure. Initial ultrasound scanning demonstrates a moderate amount of ascites within the right lower abdominal quadrant. The right lower abdomen was prepped and draped in the usual sterile fashion. 1% lidocaine  was used for local anesthesia. Following this, a 19 gauge, 7-cm, Yueh catheter was introduced. An ultrasound image was saved for documentation purposes. The paracentesis was performed. The catheter was removed and a dressing was applied. The patient tolerated the procedure well without immediate post procedural complication. FINDINGS: A total of approximately 2.2 L of bloody fluid was removed. Samples were sent to the laboratory as requested by the clinical team. IMPRESSION: Successful ultrasound-guided paracentesis yielding 2.2 liters of peritoneal fluid. Performed by Clotilda Hesselbach, PA-C Electronically Signed   By: CHRISTELLA.  Shick M.D.   On: 04/17/2024 11:48   CT ABDOMEN PELVIS WO CONTRAST Result Date: 04/16/2024 EXAM: CT ABDOMEN AND PELVIS WITHOUT CONTRAST 04/15/2024 10:17:55 PM TECHNIQUE: CT of the abdomen and pelvis was performed without the administration of intravenous contrast. Multiplanar reformatted images are provided for review. Automated exposure control, iterative reconstruction, and/or weight-based adjustment of the mA/kV was utilized to reduce the radiation dose to as low as reasonably achievable. COMPARISON: Comparison with the  04/04/2024 CLINICAL HISTORY: Abdominal pain, acute, nonlocalized. FINDINGS: LOWER CHEST: Trace bilateral pleural effusions. LIVER: The liver is unremarkable. GALLBLADDER AND BILE DUCTS: Similar reactive gallbladder wall thickening. No biliary ductal dilatation. SPLEEN: No acute abnormality. PANCREAS: Unchanged ill-defined soft tissue about the pancreatic head. This was better assessed on prior CT with contrast. ADRENAL GLANDS: No acute abnormality. KIDNEYS, URETERS AND BLADDER: No stones in the kidneys or ureters. No hydronephrosis. No perinephric or periureteral stranding. Foley catheter in the decompressed bladder. GI AND BOWEL: Oral contrast is present within the stomach and small bowel. There is dilute contrast in the colon. Wall thickening about the terminal ileum and ascending and transverse colon is grossly similar to prior given differences in contrast administration. There is no bowel obstruction. PERITONEUM AND RETROPERITONEUM: Increased small to moderate abdominal pelvic ascites. Diffuse mesenteric stranding is similar. Redemonstrated ill-defined soft tissue density throughout the base of the mesentery. This was better evaluated with CT with IV contrast  on 04/04/2024 but is grossly similar. No free air. VASCULATURE: Aorta is normal in caliber. Aortic atherosclerotic calcification. LYMPH NODES: Unchanged left periaortic lymphadenopathy measuring up to 1.7 cm on series 2 image 45. Similar enlargement of a left inguinal lymph node measuring 1.3 cm. Similar enlargement of a pericardial lymph node measuring 1.8 cm on series 2 image 14. REPRODUCTIVE ORGANS: No acute abnormality. BONES AND SOFT TISSUES: No acute osseous abnormality. No focal soft tissue abnormality. IMPRESSION: 1. Grossly similar ill-defined soft tissue throughout the base of the mesentery. 2. Increased small to moderate abdominopelvic ascites since 11 / 12 / 25 . 3. Wall thickening about the terminal ileum and ascending and transverse colon,  grossly similar to prior given differences in contrast administration. 4. Unchanged left periaortic, pericardiac, and left inguinal lymphadenopathy. Electronically signed by: Norman Gatlin MD 04/16/2024 02:38 AM EST RP Workstation: [id number]   US  RENAL Result Date: 04/14/2024 CLINICAL DATA:  Acute kidney injury EXAM: RENAL / URINARY TRACT ULTRASOUND COMPLETE COMPARISON:  CT abdomen pelvis dated 04/04/2024 FINDINGS: Right Kidney: Length = 11.5 cm Normal parenchymal echogenicity with preserved corticomedullary differentiation. No urinary tract dilation or shadowing calculi. The ureter is not seen. Left Kidney: Length = 11.1 cm Normal parenchymal echogenicity with preserved corticomedullary differentiation. No urinary tract dilation or shadowing calculi. The ureter is not seen. Bladder: Appears normal for degree of bladder distention. Other: Partially imaged small volume ascites. IMPRESSION: 1. No urinary tract dilation or shadowing calculi. Normal cortical echogenicity. 2. Partially imaged small volume ascites. Electronically Signed   By: Limin  Xu M.D.   On: 04/14/2024 15:42   DG Abd 2 Views Result Date: 04/12/2024 CLINICAL DATA:  Abdominal distention EXAM: ABDOMEN - 2 VIEW COMPARISON:  Abdominal radiograph dated 04/04/2024 FINDINGS: Nonobstructive bowel gas pattern. Relative paucity of bowel gas within the right lower quadrant and central abdomen. No free air or pneumatosis. No abnormal radio-opaque calculi or mass effect. No acute or substantial osseous abnormality. The sacrum and coccyx are partially obscured by overlying bowel contents. IMPRESSION: Nonobstructive bowel gas pattern. Relative paucity of bowel gas within the right lower quadrant and central abdomen may reflect underdistended/compressed or fluid-filled bowel loops. Electronically Signed   By: Limin  Xu M.D.   On: 04/12/2024 16:21   US  ASCITES (ABDOMEN LIMITED) Result Date: 04/11/2024 CLINICAL DATA:  Abdominal distension.  Assess for  ascites. EXAM: LIMITED ABDOMEN ULTRASOUND FOR ASCITES TECHNIQUE: Limited ultrasound survey for ascites was performed in all four abdominal quadrants. COMPARISON:  CT, 04/04/2024. FINDINGS: Small amount of ascites, most evident in the lower quadrants. This overall appears increased compared to the prior CT. IMPRESSION: 1. Small amount of ascites. Electronically Signed   By: Alm Parkins M.D.   On: 04/11/2024 12:38   CT CHEST W CONTRAST Result Date: 04/05/2024 EXAM: CT CHEST WITH CONTRAST 04/05/2024 03:37:22 PM TECHNIQUE: CT of the chest was performed with the administration of 75 mL of iohexol  (OMNIPAQUE ) 300 MG/ML solution. Multiplanar reformatted images are provided for review. Automated exposure control, iterative reconstruction, and/or weight based adjustment of the mA/kV was utilized to reduce the radiation dose to as low as reasonably achievable. COMPARISON: CT 04/04/2024 and CT abdomen and pelvis 1 day prior. CLINICAL HISTORY: Lymphadenopathy on recent CT abdomen - needs CT chest to assess for further lymphadenopathy/possible biopsy in IR. * Tracking Code: BO * FINDINGS: MEDIASTINUM: Heart is unremarkable. No pericardial fluid. The central airways are clear. LYMPH NODES: Bilateral small axillary lymph nodes. No Supraclavicular lymph nodes. No enlargement of the mediastinal lymph nodes.  No hilar lymphadenopathy. LUNGS AND PLEURA: No focal consolidation or pulmonary edema. No pleural effusion or pneumothorax. No suspicious pulmonary nodules. Mild thickening along the pleural surface of the fissures. SOFT TISSUES/BONES: No aggressive osseous lesion. No acute abnormality of the soft tissues. UPPER ABDOMEN: Limited view of the upper abdomen demonstrates infiltrative process in the upper abdominal mesentery as seen on recent CT of the abdomen and pelvis. Findings suggestive of carcinomatosis. A small enhancing lesion in the right hepatic lobe on image 119 is also described on comparison CT. IMPRESSION: 1. No  evidence of thoracic metastasis. 2. Infiltrative process in the upper abdominal mesentery suggestive of peritoneal carcinomatosis. 3. See CT abdomen and pelvis 1 day prior. Electronically signed by: Norleen Boxer MD 04/05/2024 03:54 PM EST RP Workstation: [id number]   CT ABDOMEN PELVIS W CONTRAST Result Date: 04/04/2024 CLINICAL DATA:  Bowel obstruction suspected recent enteritis, persistent distention. Abdominal distension with urinary retention and weakness. Recent antibiotic therapy for abdominal infection (suspected colitis). EXAM: CT ABDOMEN AND PELVIS WITH CONTRAST TECHNIQUE: Multidetector CT imaging of the abdomen and pelvis was performed using the standard protocol following bolus administration of intravenous contrast. RADIATION DOSE REDUCTION: This exam was performed according to the departmental dose-optimization program which includes automated exposure control, adjustment of the mA and/or kV according to patient size and/or use of iterative reconstruction technique. CONTRAST:  80mL OMNIPAQUE  IOHEXOL  300 MG/ML  SOLN COMPARISON:  Prior CTs 03/24/2024 and 07/19/2013. FINDINGS: Lower chest: Clear lung bases. No significant pleural or pericardial effusion. There is atherosclerosis of the aorta and coronary arteries. Prominent right pericardiac node measures 1.7 cm on image 13/2. Hepatobiliary: The liver has a non cirrhotic morphology without suspicious focal abnormality. There is a probable small cyst posteriorly in the right lobe measuring 1.8 cm on image 23/2. This is unchanged from the recent prior study and decreased in size from the 2015 study. Oval focus of enhancement in the dome of the right hepatic lobe measuring 2.1 cm on image 19/2 is unchanged from the recent study, probably an incidental venous anomaly. Gallbladder wall thickening, as before, likely reactive. No evidence of calcified gallstones. No significant biliary dilatation. Pancreas: Fatty atrophy of the pancreatic body and tail. There  is ill-defined soft tissue around the pancreatic head. Spleen: Normal in size without focal abnormality. Adrenals/Urinary Tract: Both adrenal glands appear normal. No evidence of urinary tract calculus, suspicious renal lesion or hydronephrosis. Early delayed post-contrast images demonstrate no significant contrast excretion from either kidney. There is mild symmetric perinephric soft tissue stranding bilaterally. Probable chronic bladder diverticula. No acute abnormality of the urinary bladder demonstrated. Stomach/Bowel: No enteric contrast administered. The stomach appears unremarkable for its degree of distention. There is wall thickening of the transverse duodenum without significant small bowel distension. Interval increased wall thickening of the terminal ileum. Persistent wall thickening of the ascending and transverse colon. Diffuse diverticulosis of the descending and sigmoid colon without acute inflammation. Vascular/Lymphatic: Prominent left periaortic node measuring 2.1 cm on image 41/2. There is ill-defined soft tissue density throughout the base of the mesentery which has increased compared with the previous prior study, including a component measuring approximately 10.9 x 6.0 cm on image 53/2. This encases the portal and superior mesenteric veins which remain patent. Mildly enlarged inguinal lymph nodes, measuring up to 1.5 cm short axis on image 91/2. Aortic and branch vessel atherosclerosis without evidence of aneurysm or large vessel occlusion. Reproductive: The prostate gland and seminal vesicles appear normal. Other: New perihepatic and perisplenic ascites with fluid in  both pericolic gutters. As above, increasing ill-defined soft tissue density throughout the base of the mesentery with diffuse stranding of the mesenteric fat. No focal fluid collection or pneumoperitoneum demonstrated. Small umbilical hernia. Musculoskeletal: No acute or significant osseous findings. Multilevel spondylosis.  IMPRESSION: 1. Interval increased ill-defined soft tissue density throughout the base of the mesentery with diffuse stranding of the mesenteric fat and new ascites. Findings are nonspecific, and potentially still inflammatory, but suspicious for lymphoma given the associated enlarged pericardiac, retroperitoneal and inguinal lymph nodes. 2. Persistent wall thickening of the ascending and transverse colon with increased wall thickening of the terminal ileum. These findings could be secondary to infection, inflammatory bowel disease or lymphedema. No evidence of bowel obstruction or perforation. 3. No evidence of urinary tract calculus or hydronephrosis. 4.  Aortic Atherosclerosis (ICD10-I70.0). Electronically Signed   By: Elsie Perone M.D.   On: 04/04/2024 12:26   DG ABD ACUTE 2+V W 1V CHEST Result Date: 04/04/2024 CLINICAL DATA:  Abdominal distension. EXAM: DG ABDOMEN ACUTE WITH 1 VIEW CHEST COMPARISON:  Radiographs 03/27/2024.  CT 03/24/2024. FINDINGS: Supine and erect views of the abdomen demonstrate mild motion artifact and chronic elevation of the right hemidiaphragm. There is a normal nonobstructive bowel gas pattern and no evidence of pneumoperitoneum. No suspicious abdominal calcifications are identified. Multilevel thoracolumbar spondylosis. IMPRESSION: No radiographic evidence of acute abdominal process. Electronically Signed   By: Elsie Perone M.D.   On: 04/04/2024 10:56   DG Abd 2 Views Result Date: 03/27/2024 CLINICAL DATA:  Diffuse abdominal pain.  Enterocolitis. EXAM: ABDOMEN - 2 VIEW COMPARISON:  CT abdomen pelvis 03/24/2024. FINDINGS: Gas and stool are scattered in nondilated colon. No small bowel dilatation. Degenerative changes in the spine. IMPRESSION: No acute findings. Electronically Signed   By: Newell Eke M.D.   On: 03/27/2024 13:25    Microbiology: Results for orders placed or performed during the hospital encounter of 04/10/24  Gram stain     Status: None    Collection Time: 04/17/24  9:28 AM   Specimen: Ascitic  Result Value Ref Range Status   Specimen Description ASCITIC  Final   Special Requests ASCITIC  Final   Gram Stain   Final    WBC PRESENT,BOTH PMN AND MONONUCLEAR NO ORGANISMS SEEN CYTOSPIN SMEAR Performed at [facility], [address] [place], [place] [address]    Report Status 04/17/2024 FINAL  Final  Culture, body fluid w Gram Stain-bottle     Status: Abnormal   Collection Time: 04/17/24  9:28 AM   Specimen: Ascitic  Result Value Ref Range Status   Specimen Description   Final    ASCITIC Performed at [facility], [address] [place], [place] [address]    Special Requests   Final    BOTTLES DRAWN AEROBIC AND ANAEROBIC Blood Culture adequate volume Performed at [facility], [address] [place], [place] [address]    Gram Stain   Final    IN BOTH AEROBIC AND ANAEROBIC BOTTLES GRAM NEGATIVE RODS Gram Stain Report Called to,Read Back By and Verified WithBETHA LEOTIS SLICKER @ 1913 ON 04/17/24 JAYSON BUCK Performed at [facility], [address], [place], [place] [address]    Culture ESCHERICHIA COLI (A)  Final   Report Status 04/19/2024 FINAL  Final   Organism ID, Bacteria ESCHERICHIA COLI  Final      Susceptibility   Escherichia coli - MIC*    AMPICILLIN <=2 SENSITIVE Sensitive     CEFAZOLIN  (NON-URINE) <=1 SENSITIVE Sensitive  CEFEPIME <=0.12 SENSITIVE Sensitive     ERTAPENEM <=0.12 SENSITIVE Sensitive     CEFTRIAXONE <=0.25 SENSITIVE Sensitive     CIPROFLOXACIN  <=0.06 SENSITIVE Sensitive     GENTAMICIN <=1 SENSITIVE Sensitive     MEROPENEM <=0.25 SENSITIVE Sensitive     TRIMETH/SULFA <=20 SENSITIVE Sensitive     AMPICILLIN/SULBACTAM <=2 SENSITIVE Sensitive     PIP/TAZO Value in next row Sensitive      <=4 SENSITIVEThis is a modified FDA-approved test that has been validated and its performance characteristics determined by the reporting laboratory.  This laboratory is certified under the Clinical Laboratory  Improvement Amendments CLIA as qualified to perform high complexity clinical laboratory testing.    * ESCHERICHIA COLI    Labs: CBC: Recent Labs  Lab 04/21/24 0438 04/24/24 0433  WBC 5.8 7.7  HGB 7.6* 9.2*  HCT 24.6* 29.9*  MCV 87.9 90.3  PLT 190 213   Basic Metabolic Panel: Recent Labs  Lab 04/20/24 0800 04/20/24 1842 04/21/24 0438 04/22/24 0422 04/23/24 0525 04/24/24 0433  NA 140 139 140 143 144 144  K 4.1 3.8 3.5 3.5 3.2* 3.3*  CL 96* 94* 94* 95* 95* 97*  CO2 36* 29 30 34* 42* 41*  GLUCOSE 108* 173* 112* 102* 104* 93  BUN 84* 82* 81* 69* 64* 51*  CREATININE 2.39* 2.31* 2.08* 1.76* 1.51* 1.27*  CALCIUM  7.7* 7.4* 7.5* 7.5* 7.7* 7.8*  PHOS 5.7*  --  5.9* 5.7* 3.9 2.9   Liver Function Tests: Recent Labs  Lab 04/20/24 1842 04/21/24 0438 04/22/24 0422 04/23/24 0525 04/24/24 0433  AST 35 34 148* 60* 47*  ALT 18 17 87* 57* 40  ALKPHOS 73 66 244* 184* 152*  BILITOT 0.3 0.3 0.4 0.4 0.4  PROT 5.2* 5.0* 4.9* 4.7* 4.6*  ALBUMIN  3.1* 3.0* 3.0* 3.1* 3.0*   CBG: Recent Labs  Lab 04/23/24 1109 04/23/24 1624 04/23/24 2140 04/24/24 0732 04/24/24 1221  GLUCAP 121* 156* 117* 101* 128*    Discharge time spent:  35 minutes.  Signed: Eric Nunnery, MD [facility] 04/24/2024

## 2024-04-24 NOTE — TOC Transition Note (Signed)
 Transition of Care [facility]) - Discharge Note   Patient Details  Name: Cody Palmer MRN: [id number] Date of Birth: 08/20/1932  [facility] CM/SW Contact:  Lucie Lunger, LCSWA Phone Number: 04/24/2024, 2:18 PM  Clinical Narrative:    CSW updated that insurance shara has been approved for SNF at [facility]. CSW spoke to [place] in admissions who confirms they can accept pt today. MD completed D/C paperwork. CSW sent D/C clinicals to facility via HUB. CSW provided RN with room and report numbers. CSW provided update to pts daughter [name] on plan for D/C to [facility] today. TOC signing off.   Final next level of care: Skilled Nursing Facility Barriers to Discharge: Barriers Resolved   Patient Goals and CMS Choice Patient states their goals for this hospitalization and ongoing recovery are:: Go to SNF CMS Medicare.gov Compare Post Acute Care list provided to:: Patient Choice offered to / list presented to : Patient [place] ownership interest in [facility]provided to:: Patient    Discharge Placement              Patient chooses bed at: [facility] Patient to be transferred to facility by: Facility staff Name of family member notified: Daughter [name] Patient and family notified of of transfer: 04/24/24  Discharge Plan and Services Additional resources added to the After Visit Summary for                                       Social Drivers of Health (SDOH) Interventions SDOH Screenings   Food Insecurity: No Food Insecurity (04/10/2024)  Housing: Low Risk  (04/10/2024)  Transportation Needs: No Transportation Needs (04/10/2024)  Utilities: Not At Risk (04/10/2024)  Social Connections: Moderately Isolated (04/10/2024)  Tobacco Use: Medium Risk (04/17/2024)     Readmission Risk Interventions    04/19/2024    1:46 PM  Readmission Risk Prevention Plan  Transportation Screening Complete  PCP or Specialist Appt within 3-5 Days Not Complete  HRI  or Home Care Consult Complete  Social Work Consult for Recovery Care Planning/Counseling Complete  Palliative Care Screening Not Applicable  Medication Review Oceanographer) Complete

## 2024-04-24 NOTE — TOC Progression Note (Signed)
 Transition of Care [facility]) - Progression Note    Patient Details  Name: Cody Palmer MRN: [id number] Date of Birth: October 23, 1932  Transition of Care [facility]) CM/SW Contact  Lucie Lunger, [place] Phone Number: 04/24/2024, 9:14 AM  Clinical Narrative:    CSW updated that pts insurance is requesting a new PT note prior to 12 today. CSW updated PT of this and requested that they see pt. Once updated note is it will be uploaded to [employer] for insurance to review. TOC to follow.   Expected Discharge Plan: Skilled Nursing Facility Barriers to Discharge: Continued Medical Work up               Expected Discharge Plan and Services       Living arrangements for the past 2 months: Single Family Home                                       Social Drivers of Health (SDOH) Interventions SDOH Screenings   Food Insecurity: No Food Insecurity (04/10/2024)  Housing: Low Risk  (04/10/2024)  Transportation Needs: No Transportation Needs (04/10/2024)  Utilities: Not At Risk (04/10/2024)  Social Connections: Moderately Isolated (04/10/2024)  Tobacco Use: Medium Risk (04/17/2024)    Readmission Risk Interventions    04/19/2024    1:46 PM  Readmission Risk Prevention Plan  Transportation Screening Complete  PCP or Specialist Appt within 3-5 Days Not Complete  HRI or Home Care Consult Complete  Social Work Consult for Recovery Care Planning/Counseling Complete  Palliative Care Screening Not Applicable  Medication Review Oceanographer) Complete

## 2024-04-25 ENCOUNTER — Non-Acute Institutional Stay: Payer: Self-pay | Admitting: Adult Health

## 2024-04-25 ENCOUNTER — Encounter: Payer: Self-pay | Admitting: Adult Health

## 2024-04-25 ENCOUNTER — Inpatient Hospital Stay (HOSPITAL_COMMUNITY): Admit: 2024-04-25 | Discharge: 2024-04-25 | Disposition: A | Attending: Oncology | Admitting: Oncology

## 2024-04-25 DIAGNOSIS — R600 Localized edema: Secondary | ICD-10-CM | POA: Diagnosis not present

## 2024-04-25 DIAGNOSIS — N183 Chronic kidney disease, stage 3 unspecified: Secondary | ICD-10-CM | POA: Diagnosis not present

## 2024-04-25 DIAGNOSIS — J3089 Other allergic rhinitis: Secondary | ICD-10-CM | POA: Diagnosis not present

## 2024-04-25 DIAGNOSIS — R627 Adult failure to thrive: Secondary | ICD-10-CM | POA: Diagnosis not present

## 2024-04-25 DIAGNOSIS — K219 Gastro-esophageal reflux disease without esophagitis: Secondary | ICD-10-CM | POA: Diagnosis not present

## 2024-04-25 DIAGNOSIS — N1832 Chronic kidney disease, stage 3b: Secondary | ICD-10-CM | POA: Diagnosis not present

## 2024-04-25 DIAGNOSIS — C8299 Follicular lymphoma, unspecified, extranodal and solid organ sites: Secondary | ICD-10-CM

## 2024-04-25 DIAGNOSIS — E785 Hyperlipidemia, unspecified: Secondary | ICD-10-CM

## 2024-04-25 DIAGNOSIS — K5909 Other constipation: Secondary | ICD-10-CM

## 2024-04-25 DIAGNOSIS — Z7984 Long term (current) use of oral hypoglycemic drugs: Secondary | ICD-10-CM

## 2024-04-25 DIAGNOSIS — E1169 Type 2 diabetes mellitus with other specified complication: Secondary | ICD-10-CM

## 2024-04-25 DIAGNOSIS — E1122 Type 2 diabetes mellitus with diabetic chronic kidney disease: Secondary | ICD-10-CM

## 2024-04-25 NOTE — Progress Notes (Signed)
 Location:  [facility] Room Number: 135 Place of Service:  SNF (31)   CODE STATUS: dnr   Allergies[1]   Chief Complaint  Patient presents with   Hospitalization Follow-up    HPI:  He is a 89 year old man who has been hospitalized from 04-10-24 through 04-24-24. His past medical history includes: CKD stage 3a; hypertension; type 2 diabetes mellitus; hyperlipidemia. He had recently been hospitalized for abdominal distention with concern for colitis versus lymphoma. He was discharged to home 4 days ago. He presented to the ED with poor po intake; no appetite; poor fluid intake and failure to thrive. He had become increasingly weaker. He was seen by GI and had a colonoscopy on 04-05-24. With a pretty normal findings except for polyps. He was also seen by oncology during his last hospitalization with arrangements made for an outpatient PET scan with lymph node biopsy.  He has been having nausea; persistent abdominal distention.  He was treated for CKD stage 3a with hyperkalemia/lymphoma He was given diamox  one time on the day of discharge and to return to lasix  40 mg daily  Failure to thrive: he remains weak and failed a recent discharge home. Megace  is being held due to risk of dvt.  Abdominal distention: is status post 2 paracentesis with 2.2 liter removed on 04-17-24 and 4 liters on 04-24-24.  There is questionable lymphoma.  He is scheduled for pet scan on 05-12-24.  He was give rasvuricase on 04-17-24 and started on prednisone  for 4 days.   He had gram negative rods peritonitis cipro  was started through 04-29-24.   he is here for short term rehab with his goal to return back home. He will continue to be followed for his chronic illnesses including: CKD stage 3 due to type 2 diabetes mellitus: Type 2 diabetes mellitus with stage 3b chronic kidney disease without long term current use of insulin : Hyperlipidemia associated with type 2 diabetes mellitus   Past Medical  History:  Diagnosis Date   Arthritis    Cancer (HCC)    Skin cancer   Diabetes mellitus without complication (HCC)    HTN (hypertension)    Hypercholesteremia     Past Surgical History:  Procedure Laterality Date   CATARACT EXTRACTION W/PHACO Left 04/25/2016   Procedure: CATARACT EXTRACTION PHACO AND INTRAOCULAR LENS PLACEMENT LEFT EYE;  Surgeon: Cherene Mania, MD;  Location: [facility];  Service: Ophthalmology;  Laterality: Left;  CDE: 10.37   COLONOSCOPY N/A 04/05/2024   Procedure: COLONOSCOPY;  Surgeon: Cindie Carlin POUR, DO;  Location: [facility] ENDO SUITE;  Service: Endoscopy;  Laterality: N/A;   CYSTOSCOPY WITH BIOPSY N/A 08/13/2013   Procedure: CYSTOSCOPY WITH BLADDER BIOPSY;  Surgeon: Garnette Shack, MD;  Location: [facility] ORS;  Service: Urology;  Laterality: N/A;   ENUCLEATION Right    Removed in 1958   EYE SURGERY  1958   removal of eye   TOTAL KNEE ARTHROPLASTY Right 01/11/2013   Procedure: RIGHT TOTAL KNEE ARTHROPLASTY;  Surgeon: Taft FORBES Minerva, MD;  Location: [facility];  Service: Orthopedics;  Laterality: Right;   TOTAL KNEE ARTHROPLASTY Left 07/12/2022   Procedure: TOTAL KNEE ARTHROPLASTY;  Surgeon: Minerva Taft FORBES, MD;  Location: [facility];  Service: Orthopedics;  Laterality: Left;    Social History   Socioeconomic History   Marital status: Widowed    Spouse name: Not on file   Number of children: Not on file   Years of education: Not on file   Highest education level:  Not on file  Occupational History   Not on file  Tobacco Use   Smoking status: Former    Current packs/day: 0.00    Average packs/day: 1 pack/day for 8.0 years (8.0 ttl pk-yrs)    Types: Cigarettes    Start date: 01/07/1957    Quit date: 01/07/1965    Years since quitting: 59.3   Smokeless tobacco: Never  Vaping Use   Vaping status: Never Used  Substance and Sexual Activity   Alcohol use: No   Drug use: No   Sexual activity: Yes    Birth control/protection: None  Other Topics Concern   Not on file   Social History Narrative   Not on file   Social Drivers of Health   Tobacco Use: Medium Risk (04/25/2024)   Patient History    Smoking Tobacco Use: Former    Smokeless Tobacco Use: Never    Passive Exposure: Not on Actuary Strain: Not on file  Food Insecurity: No Food Insecurity (04/10/2024)   Epic    Worried About Programme Researcher, Broadcasting/film/video in the Last Year: Never true    Ran Out of Food in the Last Year: Never true  Transportation Needs: No Transportation Needs (04/10/2024)   Epic    Lack of Transportation (Medical): No    Lack of Transportation (Non-Medical): No  Physical Activity: Not on file  Stress: Not on file  Social Connections: Moderately Isolated (04/10/2024)   Social Connection and Isolation Panel    Frequency of Communication with Friends and Family: More than three times a week    Frequency of Social Gatherings with Friends and Family: More than three times a week    Attends Religious Services: 1 to 4 times per year    Active Member of [employer] or Organizations: No    Attends Banker Meetings: Never    Marital Status: Widowed  Intimate Partner Violence: Not At Risk (04/10/2024)   Epic    Fear of Current or Ex-Partner: No    Emotionally Abused: No    Physically Abused: No    Sexually Abused: No  Depression (PHQ2-9): Not on file  Alcohol Screen: Not on file  Housing: Low Risk (04/10/2024)   Epic    Unable to Pay for Housing in the Last Year: No    Number of Times Moved in the Last Year: 0    Homeless in the Last Year: No  Utilities: Not At Risk (04/10/2024)   Epic    Threatened with loss of utilities: No  Health Literacy: Not on file   Family History  Problem Relation Age of Onset   Arthritis Unknown    Cancer Unknown    Diabetes Unknown       VITAL SIGNS BP (!) 110/54   Pulse 78   Temp (!) 97.5 F (36.4 C)   Resp 18   Ht 5' 9 (1.753 m)   Wt 205 lb 1.6 oz (93 kg)   SpO2 97%   BMI 30.29 kg/m   Outpatient Encounter  Medications as of 04/25/2024  Medication Sig   omeprazole (PRILOSEC) 40 MG capsule Take 40 mg by mouth daily.   allopurinol  (ZYLOPRIM ) 300 MG tablet Take 0.5 tablets (150 mg total) by mouth daily.   aspirin  EC 81 MG tablet Take 81 mg by mouth daily. Swallow whole.   Cholecalciferol  (VITAMIN D3) 25 MCG (1000 UT) CAPS Take 1,000 Units by mouth daily.   ciprofloxacin  (CIPRO ) 500 MG tablet Take 1 tablet (500  mg total) by mouth 2 (two) times daily for 5 days.   fluticasone  (FLONASE ) 50 MCG/ACT nasal spray Place 2 sprays into both nostrils daily.   furosemide  (LASIX ) 40 MG tablet Take 1 tablet (40 mg total) by mouth daily.   labetalol  (NORMODYNE ) 100 MG tablet Take 1 tablet (100 mg total) by mouth 2 (two) times daily.   loratadine  (CLARITIN ) 10 MG tablet Take 1 tablet (10 mg total) by mouth daily.   Multiple Vitamins-Minerals (EYE HEALTH AREDS 2 PO) Take 1 capsule by mouth 2 (two) times daily.   Nutritional Supplements (GLUCERNA 1.0 CAL/FIBER) LIQD Take 1 Bottle by mouth 2 (two) times daily between meals.   oxyCODONE  (OXY IR/ROXICODONE ) 5 MG immediate release tablet Take 1 tablet (5 mg total) by mouth every 6 (six) hours as needed for severe pain (pain score 7-10).   polyethylene glycol (MIRALAX ) 17 g packet Take 17 g by mouth daily as needed for mild constipation or moderate constipation.   prochlorperazine  (COMPAZINE ) 10 MG tablet Take 1 tablet (10 mg total) by mouth every 6 (six) hours as needed for nausea or vomiting.   rosuvastatin  (CRESTOR ) 10 MG tablet Take 10 mg by mouth every other day.   simethicone  (MYLICON) 80 MG chewable tablet Chew 1 tablet (80 mg total) by mouth every 6 (six) hours as needed for flatulence.   SITagliptin  25 MG TABS Take 12.5 mg by mouth daily at 6 (six) AM.   [DISCONTINUED] megestrol  (MEGACE ) 40 MG tablet Take 1 tablet (40 mg total) by mouth daily.   [DISCONTINUED] pantoprazole  (PROTONIX ) 40 MG tablet Take 1 tablet (40 mg total) by mouth daily. 30 minutes before  breakfast   No facility-administered encounter medications on file as of 04/25/2024.     SIGNIFICANT DIAGNOSTIC EXAMS  LABS:   04-10-24: wbc 5.0; hgb 9.3; hct 83.8 plt 153; glucose 102; bun 60; creat 2.45; k+ 5.0; na++ 134; ca 8. 3; gfr 25; protein 5.6; albumin  3.6; ast 70 alt 50 04-13-24: glucose 81; bun 43; creat 1.53; k+ 4.5; na++ 139; ca 8.3 gfr 43 04-16-24: glucose 91; bun 59; creat 2.35; k+ 5.2; na++ 137; ca 8.2 gfr 26; phos 5.3 albumin  3.2 LDH 1249.0 04-17-24: uric acid 20.4 04-21-24: uric acid 7.4 04-24-24: wbc 7.7; hgb 9.2; hct 29.9; mcv 90.3 plt 213; glucose 93; bun 51; creat 1.27; k+ 3.3; na++ 144; ca 7.8 gfr 54; alk phos 152; LDH 996  Review of Systems  Constitutional:  Negative for malaise/fatigue.  Respiratory:  Negative for cough and shortness of breath.   Cardiovascular:  Negative for chest pain, palpitations and leg swelling.  Gastrointestinal:  Negative for abdominal pain, constipation and heartburn.  Musculoskeletal:  Negative for back pain, joint pain and myalgias.  Skin: Negative.   Neurological:  Negative for dizziness.  Psychiatric/Behavioral:  The patient is not nervous/anxious.     Physical Exam Constitutional:      General: He is not in acute distress.    Appearance: He is well-developed. He is not diaphoretic.  Neck:     Thyroid: No thyromegaly.  Cardiovascular:     Rate and Rhythm: Normal rate and regular rhythm.     Pulses: Normal pulses.     Heart sounds: Murmur heard.  Pulmonary:     Effort: Pulmonary effort is normal. No respiratory distress.     Breath sounds: Normal breath sounds.  Abdominal:     General: Bowel sounds are normal. There is distension.     Palpations: Abdomen is soft.  Tenderness: There is no abdominal tenderness.  Musculoskeletal:        General: Normal range of motion.     Cervical back: Neck supple.     Right lower leg: Edema present.     Left lower leg: Edema present.  Lymphadenopathy:     Cervical: No cervical  adenopathy.  Skin:    General: Skin is warm and dry.  Neurological:     Mental Status: He is alert. Mental status is at baseline.  Psychiatric:        Mood and Affect: Mood normal.       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 He was given rasvuricase on 04-17-24 and 4 days of prednisone . He has required 2 paracentesis while hospitalized.   2. Failure to thrive in adult: has been in the hospital recently twice. His weight is 205 pounds  3. CKD stage 3 due to type 2 diabetes mellitus: bun 51; creat 1.27; gfr 54  4. Type 2 diabetes mellitus with stage 3b chronic kidney disease without long term current use of insulin : will continue januvia  12.5 mg daily   5. Hyperlipidemia associated with type 2 diabetes mellitus: will continue crestor  10 mg daily   6. GERD without esophagitis: will continue prilosec 40 mg daily   7. Chronic non-seasonal allergic rhinitis: will continue flonase  2 sprays daily and claritin  10 mg daily   8. Bilateral lower extremity edema: will continue lasix  40 mg daily   9. Hypertension associated with stage 3b chronic kidney disease due type 2 diabetes mellitus: b/p 11/54 will continue labetalol  100 mg twice daily  10.  Chronic constipation: will continue miralax  daily as needed  11. Chronic gout due to renal impairment: will continue allopurinol  150 mg twice daily     Barnie Seip NP [facility]   call [phone]      [1] No Known Allergies

## 2024-04-26 ENCOUNTER — Ambulatory Visit: Payer: Self-pay | Admitting: Gastroenterology

## 2024-04-26 LAB — FLOW CYTOMETRY

## 2024-04-29 ENCOUNTER — Other Ambulatory Visit (HOSPITAL_COMMUNITY): Payer: Self-pay | Admitting: Internal Medicine

## 2024-04-29 DIAGNOSIS — E1122 Type 2 diabetes mellitus with diabetic chronic kidney disease: Secondary | ICD-10-CM | POA: Insufficient documentation

## 2024-04-29 DIAGNOSIS — K5909 Other constipation: Secondary | ICD-10-CM | POA: Insufficient documentation

## 2024-04-29 DIAGNOSIS — J3089 Other allergic rhinitis: Secondary | ICD-10-CM | POA: Insufficient documentation

## 2024-04-29 DIAGNOSIS — E1169 Type 2 diabetes mellitus with other specified complication: Secondary | ICD-10-CM | POA: Insufficient documentation

## 2024-04-29 DIAGNOSIS — R188 Other ascites: Secondary | ICD-10-CM

## 2024-04-29 DIAGNOSIS — R600 Localized edema: Secondary | ICD-10-CM | POA: Insufficient documentation

## 2024-04-29 DIAGNOSIS — E1022 Type 1 diabetes mellitus with diabetic chronic kidney disease: Secondary | ICD-10-CM | POA: Insufficient documentation

## 2024-04-29 DIAGNOSIS — K219 Gastro-esophageal reflux disease without esophagitis: Secondary | ICD-10-CM | POA: Insufficient documentation

## 2024-04-30 ENCOUNTER — Non-Acute Institutional Stay: Payer: Self-pay | Admitting: Internal Medicine

## 2024-04-30 ENCOUNTER — Other Ambulatory Visit (HOSPITAL_COMMUNITY): Payer: Self-pay | Admitting: Adult Health

## 2024-04-30 ENCOUNTER — Other Ambulatory Visit: Payer: Self-pay

## 2024-04-30 ENCOUNTER — Encounter: Payer: Self-pay | Admitting: Internal Medicine

## 2024-04-30 ENCOUNTER — Other Ambulatory Visit: Payer: Self-pay | Admitting: Adult Health

## 2024-04-30 DIAGNOSIS — E440 Moderate protein-calorie malnutrition: Secondary | ICD-10-CM | POA: Diagnosis not present

## 2024-04-30 DIAGNOSIS — Z794 Long term (current) use of insulin: Secondary | ICD-10-CM | POA: Diagnosis not present

## 2024-04-30 DIAGNOSIS — E1122 Type 2 diabetes mellitus with diabetic chronic kidney disease: Secondary | ICD-10-CM

## 2024-04-30 DIAGNOSIS — R627 Adult failure to thrive: Secondary | ICD-10-CM

## 2024-04-30 DIAGNOSIS — C8299 Follicular lymphoma, unspecified, extranodal and solid organ sites: Secondary | ICD-10-CM

## 2024-04-30 DIAGNOSIS — I10 Essential (primary) hypertension: Secondary | ICD-10-CM

## 2024-04-30 DIAGNOSIS — R188 Other ascites: Secondary | ICD-10-CM

## 2024-04-30 DIAGNOSIS — N183 Chronic kidney disease, stage 3 unspecified: Secondary | ICD-10-CM | POA: Diagnosis not present

## 2024-04-30 NOTE — Progress Notes (Unsigned)
 NURSING HOME LOCATION:  [facility] ROOM NUMBER: [id number]  CODE STATUS: DNR  PCP: [place] Cesar [name] MD  This is a comprehensive admission note to this SNFperformed on this date less than 30 days from date of admission. Included are preadmission medical/surgical history; reconciled medication list; family history; social history and comprehensive review of systems.  Corrections and additions to the records were documented. Comprehensive physical exam was also performed. Additionally a clinical summary was entered for each active diagnosis pertinent to this admission in the Problem List to enhance continuity of care.  HPI: He was hospitalized 11/26 - 04/24/2024 with AKI.  Prior to this admission he had been hospitalized for abdominal distention with concern for colitis versus lymphoma.  He was discharged but returned after 4 days due to poor oral intake, central anorexia, and decreased fluid intake.  This is associated with progressive weakness. This constellation was associated with loose, diarrheal stools. AKI was documented on IV fluid hydration initiated.SABRA He did exhibit a peak creatinine of 3.06 with a nadir eGFR of 19 indicating CKD stage IV.  Final creatinine was 1.27 and GFR 54 indicating CKD stage IIIa.  Potassium ranged from a nadir of 3.2 up to a high of 5.3; final value was 3.3.  Protein/caloric malnutrition was present with an albumin  of 3.0 and total protein 4.6.  Transaminitis and elevation of LDH were present.  Final AST was 47; peak was 148.  ALT was normal at 40; peak value had been 87.  Final LDH was 996 with a peak value of 1263.  Normochromic, normocytic anemia was present with H/H of 9/29.6; nadir values have been 7.6/24.6.  Glucoses while hospitalized ranged from 78 up to 224; both were outliers.  His A1c was 6.6% on 11/20 indicating excellent control.  In the context of AKI some discordance might be expected. The abdominal distention was worsening for  intra-abdominal lymphoma.  Oncologic workup was in process.  Lymph node biopsy by IR was to be performed after PET scan.  Paracentesis resulted in the production of 2.2 L; this was repeated 12/4 with removal of 4 L.  Cultures demonstrated gram-negative rods in both aerobic and anaerobic cultures.  Renal dose adjusted ciprofloxacin  was initiated.  This was transitioned to 5 additional days of oral therapy.  Albumin  was ordered.  Core biopsy suggested follicular B-cell lymphoma/CLL. Oncology prescribed rasburicase  and prednisone  60 mg twice a day was initiated. Cautious diuresis was pursued.  Focus was on limiting nephrotoxicity drug exposure and adequate hydration.  Oncology recommended Megace  supplementation.  Rituximab was to be initiated after discharge. Because of soft blood pressures only labetalol  at a reduced dose of 100 mg twice a day was continued; the remainder of the antihypertensive agents were held. His generalized weakness was attributed to metabolic derangements.  PT/OT consulted and recommended SNF placement for rehab.  Past medical and surgical history: Includes history of skin cancer; essential hypertension; dyslipidemia; diabetes with CKD; degenerative joint disease; GERD; follicular lymphoma; adult failure to thrive. Surgeries and procedures include colonoscopy; cystoscopy; and bilateral TKA.  Family history: reviewed, non contributory due to advanced age.  Social history: Nondrinker; former smoker.   Review of systems:  He is cognizant of the reasons for his admission.  He is aware that he is to have his third paracentesis tomorrow 12/17.  As the intra-abdominal fluid accumulates he does have associated shortness of breath.  He is aware that he has a diagnosis of lymphoma and that they can treat it.  Apparently rituximab is to be started 12/22 by Dr. Davonna. According to his son his appetite has improved significantly.  He d Apparently the Foley catheter was inserted because of  the AKI.  He has no GU symptoms.   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   oes have slight dysphagia.  He also has been reported as having minor snoring without any history of apnea. Constitutional: No fever, significant weight change, fatigue  Eyes: No redness, discharge, pain, vision change ENT/mouth: No nasal congestion, purulent discharge, earache, change in hearing, sore throat  Cardiovascular: No chest pain, palpitations, paroxysmal nocturnal dyspnea, claudication, edema  Respiratory: No cough, sputum production, hemoptysis, DOE, significant snoring, apnea Gastrointestinal: No heartburn, dysphagia, abdominal pain, nausea /vomiting, rectal bleeding, melena, change in bowels Genitourinary: No dysuria, hematuria, pyuria, incontinence, nocturia Musculoskeletal: No joint stiffness, joint swelling, weakness, pain Dermatologic: No rash, pruritus, change in appearance of skin Neurologic: No dizziness, headache, syncope, seizures, numbness, tingling Psychiatric: No significant anxiety, depression, insomnia, anorexia Endocrine: No change in hair/skin/nails, excessive thirst, excessive hunger, excessive urination  Hematologic/lymphatic: No significant bruising, lymphadenopathy, abnormal bleeding Allergy/immunology: No itchy/watery eyes, significant sneezing, urticaria, angioedema  Physical exam:  Pertinent or positive findings: He appears his age and suboptimally nourished.  Hair is white and disheveled.  OD prosthesis is present.  He has few remaining maxillary mandibular teeth.  He is not wearing his upper or lower partial.  He has 4 is present.  He has minor rales at the right base.  Abdomen is protuberant with decreased bowel sounds.  He has 1+ edema at the sock line.  Pedal pulses are not palpable.  He is wearing compression hose.  He has scattered low-grade ecchymoses over the forearms.   General appearance: Adequately nourished; no acute distress,  increased work of breathing is present.   Lymphatic: No lymphadenopathy about the head, neck, axilla. Eyes: No conjunctival inflammation or lid edema is present. There is no scleral icterus. Ears:  External ear exam shows no significant lesions or deformities.   Nose:  External nasal examination shows no deformity or inflammation. Nasal mucosa are pink and moist without lesions, exudates Oral exam: Lips and gums are healthy appearing.There is no oropharyngeal erythema or exudate. Neck:  No thyromegaly, masses, tenderness noted.    Heart:  Normal rate and regular rhythm. S1 and S2 normal without gallop, murmur, click, rub.  Lungs: Chest clear to auscultation without wheezes, rhonchi, rales, rubs. Abdomen: Bowel sounds are normal.  Abdomen is soft and nontender with no organomegaly, hernias, masses. GU: Deferred  Extremities:  No cyanosis, clubbing, edema. Neurologic exam:  Strength equal  in upper & lower extremities. Balance, Rhomberg, finger to nose testing could not be completed due to clinical state Deep tendon reflexes are equal Skin: Warm & dry w/o tenting. No significant lesions or rash.  See clinical summary under each active problem in the Problem List with associated updated therapeutic plan

## 2024-04-30 NOTE — Assessment & Plan Note (Signed)
 While hospitalized blood pressures were soft prompting decrease in beta blocker dosage to 100 mg twice daily.  Other antihypertensives were held.  Blood pressure control is adequate on present regimen.  Continue to monitor.

## 2024-04-30 NOTE — Assessment & Plan Note (Signed)
 Prior to lymphoma diagnosis he was described by his son as independent & very active.Failure to thrive syndrome is multifactorial including advanced age, protein/caloric malnutrition, intra-abdominal lymphoma; and diabetes with CKD. Care Team assessment @ SNF.

## 2024-04-30 NOTE — Assessment & Plan Note (Signed)
 Current albumin  is 3.0 and total protein 4.6.  According to son appetite has improved.  Nutritionist to assess at SNF.

## 2024-04-30 NOTE — Patient Instructions (Signed)
 See assessment and plan under each diagnosis in the problem list and acutely for this visit

## 2024-04-30 NOTE — Assessment & Plan Note (Addendum)
 PET scan is scheduled for 05/02/2024.  Rituximab is to be initiated 12/22.  Repeat paracentesis scheduled for 12/17. He is anxious to initiate chemotherapy; he and his son are questioning why the rituximab cannot be initiated prior to the PET scan.  I will convey their queries to Dr.Kandala.

## 2024-04-30 NOTE — Assessment & Plan Note (Signed)
 While hospitalized glucoses ranged from a low of 78 up to high of 224; both outliers.  A1c on 04/04/2024 was 6.6%, indicating excellent control. Final creatinine was 1.27 with a GFR of 54 indicating CKD stage IIIa.  While hospitalized peak creatinine was 3.06 and nadir GFR 19 indicating stage IV AKI. Avoid nephrotoxic medications.  Continue glucose monitor at SNF.  At this time the daily blood sugars average 102 with a range of 99-115.  This is despite high-dose oral steroids while hospitalized.

## 2024-05-01 ENCOUNTER — Inpatient Hospital Stay: Attending: Oncology | Admitting: Licensed Clinical Social Worker

## 2024-05-01 ENCOUNTER — Encounter (HOSPITAL_COMMUNITY): Payer: Self-pay

## 2024-05-01 ENCOUNTER — Ambulatory Visit (HOSPITAL_COMMUNITY)
Admission: RE | Admit: 2024-05-01 | Discharge: 2024-05-01 | Disposition: A | Discharge status: Home / Self Care | Source: Ambulatory Visit | Attending: Internal Medicine | Admitting: Internal Medicine

## 2024-05-01 DIAGNOSIS — Z8572 Personal history of non-Hodgkin lymphomas: Secondary | ICD-10-CM | POA: Insufficient documentation

## 2024-05-01 DIAGNOSIS — Z79620 Long term (current) use of immunosuppressive biologic: Secondary | ICD-10-CM | POA: Insufficient documentation

## 2024-05-01 DIAGNOSIS — R188 Other ascites: Secondary | ICD-10-CM | POA: Insufficient documentation

## 2024-05-01 DIAGNOSIS — C8295 Follicular lymphoma, unspecified, lymph nodes of inguinal region and lower limb: Secondary | ICD-10-CM | POA: Insufficient documentation

## 2024-05-01 DIAGNOSIS — Z5112 Encounter for antineoplastic immunotherapy: Secondary | ICD-10-CM | POA: Insufficient documentation

## 2024-05-01 DIAGNOSIS — C8299 Follicular lymphoma, unspecified, extranodal and solid organ sites: Secondary | ICD-10-CM

## 2024-05-01 MED ORDER — LIDOCAINE HCL (PF) 2 % IJ SOLN
INTRAMUSCULAR | Status: AC
  Filled 2024-05-01: qty 10

## 2024-05-01 NOTE — Progress Notes (Signed)
 Patient tolerated right sided Paracentesis procedure well today and 4 Liters of yellow ascites removed. Patient verbalized understanding of discharge instructions and left via wheelchair with [facility] staff with no acute distress noted.

## 2024-05-01 NOTE — Procedures (Signed)
Ultrasound-guided therapeutic paracentesis performed yielding 4 liters of straw colored fluid.  No immediate complications. EBL is none. ° °

## 2024-05-02 ENCOUNTER — Other Ambulatory Visit (HOSPITAL_COMMUNITY)
Admission: RE | Admit: 2024-05-02 | Discharge: 2024-05-02 | Disposition: A | Source: Skilled Nursing Facility | Attending: Adult Health | Admitting: Adult Health

## 2024-05-02 ENCOUNTER — Inpatient Hospital Stay: Admitting: Oncology

## 2024-05-02 ENCOUNTER — Inpatient Hospital Stay

## 2024-05-02 ENCOUNTER — Inpatient Hospital Stay (HOSPITAL_COMMUNITY)

## 2024-05-02 ENCOUNTER — Encounter: Payer: Self-pay | Admitting: Hematology

## 2024-05-02 ENCOUNTER — Inpatient Hospital Stay (HOSPITAL_COMMUNITY)
Admission: AD | Admit: 2024-05-02 | Discharge: 2024-05-13 | DRG: 829 | Disposition: A | Discharge status: Skilled Nursing Facility | Source: Skilled Nursing Facility | Attending: Internal Medicine | Admitting: Internal Medicine

## 2024-05-02 ENCOUNTER — Inpatient Hospital Stay (HOSPITAL_COMMUNITY)
Admit: 2024-05-02 | Discharge: 2024-05-02 | Disposition: A | Discharge status: Home / Self Care | Attending: Oncology | Admitting: Oncology

## 2024-05-02 ENCOUNTER — Ambulatory Visit: Admitting: Gastroenterology

## 2024-05-02 ENCOUNTER — Other Ambulatory Visit: Payer: Self-pay

## 2024-05-02 ENCOUNTER — Encounter (HOSPITAL_COMMUNITY): Payer: Self-pay

## 2024-05-02 VITALS — BP 131/56 | HR 100 | Temp 98.0°F | Resp 19 | Ht 69.0 in | Wt 202.0 lb

## 2024-05-02 DIAGNOSIS — Z87891 Personal history of nicotine dependence: Secondary | ICD-10-CM

## 2024-05-02 DIAGNOSIS — Z7984 Long term (current) use of oral hypoglycemic drugs: Secondary | ICD-10-CM

## 2024-05-02 DIAGNOSIS — N1831 Chronic kidney disease, stage 3a: Secondary | ICD-10-CM | POA: Diagnosis present

## 2024-05-02 DIAGNOSIS — C8299 Follicular lymphoma, unspecified, extranodal and solid organ sites: Secondary | ICD-10-CM | POA: Diagnosis present

## 2024-05-02 DIAGNOSIS — E785 Hyperlipidemia, unspecified: Secondary | ICD-10-CM | POA: Diagnosis present

## 2024-05-02 DIAGNOSIS — R748 Abnormal levels of other serum enzymes: Secondary | ICD-10-CM

## 2024-05-02 DIAGNOSIS — E7800 Pure hypercholesterolemia, unspecified: Secondary | ICD-10-CM | POA: Diagnosis present

## 2024-05-02 DIAGNOSIS — E876 Hypokalemia: Secondary | ICD-10-CM | POA: Diagnosis present

## 2024-05-02 DIAGNOSIS — I10 Essential (primary) hypertension: Secondary | ICD-10-CM | POA: Diagnosis present

## 2024-05-02 DIAGNOSIS — R180 Malignant ascites: Secondary | ICD-10-CM

## 2024-05-02 DIAGNOSIS — Z5112 Encounter for antineoplastic immunotherapy: Secondary | ICD-10-CM | POA: Diagnosis not present

## 2024-05-02 DIAGNOSIS — Z7982 Long term (current) use of aspirin: Secondary | ICD-10-CM

## 2024-05-02 DIAGNOSIS — Z66 Do not resuscitate: Secondary | ICD-10-CM | POA: Diagnosis present

## 2024-05-02 DIAGNOSIS — Z96653 Presence of artificial knee joint, bilateral: Secondary | ICD-10-CM | POA: Diagnosis present

## 2024-05-02 DIAGNOSIS — C8590 Non-Hodgkin lymphoma, unspecified, unspecified site: Secondary | ICD-10-CM | POA: Diagnosis present

## 2024-05-02 DIAGNOSIS — I129 Hypertensive chronic kidney disease with stage 1 through stage 4 chronic kidney disease, or unspecified chronic kidney disease: Secondary | ICD-10-CM | POA: Diagnosis present

## 2024-05-02 DIAGNOSIS — Z85828 Personal history of other malignant neoplasm of skin: Secondary | ICD-10-CM | POA: Diagnosis not present

## 2024-05-02 DIAGNOSIS — N1832 Chronic kidney disease, stage 3b: Secondary | ICD-10-CM | POA: Diagnosis present

## 2024-05-02 DIAGNOSIS — E78019 Familial hypercholesterolemia, unspecified: Secondary | ICD-10-CM | POA: Diagnosis not present

## 2024-05-02 DIAGNOSIS — E1122 Type 2 diabetes mellitus with diabetic chronic kidney disease: Secondary | ICD-10-CM | POA: Diagnosis present

## 2024-05-02 DIAGNOSIS — K219 Gastro-esophageal reflux disease without esophagitis: Secondary | ICD-10-CM | POA: Diagnosis present

## 2024-05-02 DIAGNOSIS — R188 Other ascites: Secondary | ICD-10-CM | POA: Diagnosis present

## 2024-05-02 DIAGNOSIS — Z79899 Other long term (current) drug therapy: Secondary | ICD-10-CM | POA: Diagnosis not present

## 2024-05-02 DIAGNOSIS — C8290 Follicular lymphoma, unspecified, unspecified site: Secondary | ICD-10-CM | POA: Diagnosis present

## 2024-05-02 DIAGNOSIS — R1909 Other intra-abdominal and pelvic swelling, mass and lump: Secondary | ICD-10-CM

## 2024-05-02 DIAGNOSIS — R7401 Elevation of levels of liver transaminase levels: Secondary | ICD-10-CM | POA: Diagnosis not present

## 2024-05-02 LAB — COMPREHENSIVE METABOLIC PANEL WITH GFR
ALT: 297 U/L — ABNORMAL HIGH (ref 0–44)
ALT: 308 U/L — ABNORMAL HIGH (ref 0–44)
AST: 453 U/L — ABNORMAL HIGH (ref 15–41)
AST: 420 U/L — ABNORMAL HIGH (ref 15–41)
Albumin: 2.4 g/dL — ABNORMAL LOW (ref 3.5–5.0)
Albumin: 2.6 g/dL — ABNORMAL LOW (ref 3.5–5.0)
Alkaline Phosphatase: 1402 U/L — ABNORMAL HIGH (ref 38–126)
Alkaline Phosphatase: 1578 U/L — ABNORMAL HIGH (ref 38–126)
Anion gap: 7 (ref 5–15)
Anion gap: 15 (ref 5–15)
BUN: 35 mg/dL — ABNORMAL HIGH (ref 8–23)
BUN: 35 mg/dL — ABNORMAL HIGH (ref 8–23)
CO2: 34 mmol/L — ABNORMAL HIGH (ref 22–32)
CO2: 26 mmol/L (ref 22–32)
Calcium: 7.6 mg/dL — ABNORMAL LOW (ref 8.9–10.3)
Calcium: 7.7 mg/dL — ABNORMAL LOW (ref 8.9–10.3)
Chloride: 102 mmol/L (ref 98–111)
Chloride: 101 mmol/L (ref 98–111)
Creatinine, Ser: 1.34 mg/dL — ABNORMAL HIGH (ref 0.61–1.24)
Creatinine, Ser: 1.37 mg/dL — ABNORMAL HIGH (ref 0.61–1.24)
GFR, Estimated: 51 mL/min — ABNORMAL LOW (ref >60)
GFR, Estimated: 49 mL/min — ABNORMAL LOW (ref >60)
Glucose, Bld: 95 mg/dL (ref 70–99)
Glucose, Bld: 105 mg/dL — ABNORMAL HIGH (ref 70–99)
Potassium: 3.3 mmol/L — ABNORMAL LOW (ref 3.5–5.1)
Potassium: 3.4 mmol/L — ABNORMAL LOW (ref 3.5–5.1)
Sodium: 143 mmol/L (ref 135–145)
Sodium: 142 mmol/L (ref 135–145)
Total Bilirubin: 3.3 mg/dL — ABNORMAL HIGH (ref 0.0–1.2)
Total Bilirubin: 4.2 mg/dL — ABNORMAL HIGH (ref 0.0–1.2)
Total Protein: 4.2 g/dL — ABNORMAL LOW (ref 6.5–8.1)
Total Protein: 4.6 g/dL — ABNORMAL LOW (ref 6.5–8.1)

## 2024-05-02 LAB — CBC WITH DIFFERENTIAL/PLATELET
Abs Immature Granulocytes: 0.03 K/uL (ref 0.00–0.07)
Basophils Absolute: 0.1 K/uL (ref 0.0–0.1)
Basophils Relative: 1 %
Eosinophils Absolute: 0.3 K/uL (ref 0.0–0.5)
Eosinophils Relative: 4 %
HCT: 33.4 % — ABNORMAL LOW (ref 39.0–52.0)
Hemoglobin: 10.7 g/dL — ABNORMAL LOW (ref 13.0–17.0)
Immature Granulocytes: 0 %
Lymphocytes Relative: 28 %
Lymphs Abs: 2.6 K/uL (ref 0.7–4.0)
MCH: 27.8 pg (ref 26.0–34.0)
MCHC: 32 g/dL (ref 30.0–36.0)
MCV: 86.8 fL (ref 80.0–100.0)
Monocytes Absolute: 0.5 K/uL (ref 0.1–1.0)
Monocytes Relative: 6 %
Neutro Abs: 5.8 K/uL (ref 1.7–7.7)
Neutrophils Relative %: 61 %
Platelets: 261 K/uL (ref 150–400)
RBC: 3.85 MIL/uL — ABNORMAL LOW (ref 4.22–5.81)
RDW: 19.7 % — ABNORMAL HIGH (ref 11.5–15.5)
WBC: 9.3 K/uL (ref 4.0–10.5)
nRBC: 0 % (ref 0.0–0.2)

## 2024-05-02 LAB — CBC
HCT: 30.4 % — ABNORMAL LOW (ref 39.0–52.0)
Hemoglobin: 9.6 g/dL — ABNORMAL LOW (ref 13.0–17.0)
MCH: 27.7 pg (ref 26.0–34.0)
MCHC: 31.6 g/dL (ref 30.0–36.0)
MCV: 87.9 fL (ref 80.0–100.0)
Platelets: 236 K/uL (ref 150–400)
RBC: 3.46 MIL/uL — ABNORMAL LOW (ref 4.22–5.81)
RDW: 19.6 % — ABNORMAL HIGH (ref 11.5–15.5)
WBC: 8.2 K/uL (ref 4.0–10.5)
nRBC: 0 % (ref 0.0–0.2)

## 2024-05-02 LAB — LACTATE DEHYDROGENASE: LDH: 1517 U/L — ABNORMAL HIGH (ref 105–235)

## 2024-05-02 LAB — PHOSPHORUS: Phosphorus: 3.8 mg/dL (ref 2.5–4.6)

## 2024-05-02 LAB — URIC ACID: Uric Acid, Serum: 8 mg/dL (ref 3.7–8.6)

## 2024-05-02 LAB — MRSA NEXT GEN BY PCR, NASAL: MRSA by PCR Next Gen: NOT DETECTED

## 2024-05-02 LAB — MAGNESIUM: Magnesium: 2 mg/dL (ref 1.7–2.4)

## 2024-05-02 MED ORDER — CHLORHEXIDINE GLUCONATE CLOTH 2 % EX PADS
6.0000 | MEDICATED_PAD | Freq: Every day | CUTANEOUS | Status: DC
  Administered 2024-05-03 – 2024-05-09 (×7): 6 via TOPICAL

## 2024-05-02 MED ORDER — SODIUM CHLORIDE 0.9 % IV SOLN
2.0000 g | INTRAVENOUS | Status: DC
  Administered 2024-05-02 – 2024-05-06 (×4): 2 g via INTRAVENOUS
  Filled 2024-05-02 (×5): qty 20

## 2024-05-02 MED ORDER — OXYCODONE HCL 5 MG PO TABS
5.0000 mg | ORAL_TABLET | Freq: Four times a day (QID) | ORAL | Status: DC | PRN
  Administered 2024-05-03: 5 mg via ORAL
  Filled 2024-05-02 (×3): qty 1

## 2024-05-02 MED ORDER — POTASSIUM CHLORIDE CRYS ER 20 MEQ PO TBCR
40.0000 meq | EXTENDED_RELEASE_TABLET | Freq: Once | ORAL | Status: AC
  Administered 2024-05-02: 17:00:00 40 meq via ORAL
  Filled 2024-05-02: qty 2

## 2024-05-02 MED ORDER — ALBUTEROL SULFATE (2.5 MG/3ML) 0.083% IN NEBU: 2.5000 mg | INHALATION_SOLUTION | RESPIRATORY_TRACT | Status: DC | PRN

## 2024-05-02 MED ORDER — HYDRALAZINE HCL 20 MG/ML IJ SOLN: 5.0000 mg | INTRAMUSCULAR | Status: DC | PRN

## 2024-05-02 MED ORDER — HEPARIN SODIUM (PORCINE) 5000 UNIT/ML IJ SOLN
5000.0000 [IU] | Freq: Three times a day (TID) | INTRAMUSCULAR | Status: DC
  Administered 2024-05-03 – 2024-05-13 (×27): 5000 [IU] via SUBCUTANEOUS
  Filled 2024-05-02 (×26): qty 1

## 2024-05-02 MED ORDER — FLUDEOXYGLUCOSE F - 18 (FDG) INJECTION
10.6000 | Freq: Once | INTRAVENOUS | Status: AC | PRN
  Administered 2024-05-02: 12:00:00 10.6 via INTRAVENOUS

## 2024-05-02 MED ORDER — ALLOPURINOL 300 MG PO TABS
150.0000 mg | ORAL_TABLET | Freq: Every day | ORAL | Status: DC
  Administered 2024-05-03 – 2024-05-13 (×11): 150 mg via ORAL
  Filled 2024-05-02 (×10): qty 1

## 2024-05-02 MED ORDER — PANTOPRAZOLE SODIUM 40 MG PO TBEC
40.0000 mg | DELAYED_RELEASE_TABLET | Freq: Every day | ORAL | Status: DC
  Administered 2024-05-02 – 2024-05-13 (×12): 40 mg via ORAL
  Filled 2024-05-02 (×11): qty 1

## 2024-05-02 NOTE — Patient Instructions (Addendum)
 [facility]  Discharge Instructions  You were seen and examined today by Dr. Davonna. Dr. Davonna is a medical oncologist, meaning that she specializes in the treatment of cancer diagnoses. Dr. Davonna discussed your past medical history, family history of cancers, and the events that led to you being here today.  You were seen today for a new diagnosis of follicular lymphoma. This needs to be treated with Rituxan, which is a monoclonal antibody; however, Dr. Davonna is concerned that your labs are not stable to be treated outpatient and has recommended admission to the hospital to start treatment tomorrow.  Follow-up as scheduled.  Thank you for choosing [name] to provide your oncology and hematology care.   To afford each patient quality time with our provider, please arrive at least 15 minutes before your scheduled appointment time. You may need to reschedule your appointment if you arrive late (10 or more minutes). Arriving late affects you and other patients whose appointments are after yours.  Also, if you miss three or more appointments without notifying the office, you may be dismissed from the clinic at the providers discretion.    Again, thank you for choosing [facility].  Our hope is that these requests will decrease the amount of time that you wait before being seen by our physicians.   If you have a lab appointment with the Cancer Center - please note that after April 8th, all labs will be drawn in the cancer center.  You do not have to check in or register with the main entrance as you have in the past but will complete your check-in at the cancer center.            _____________________________________________________________  Should you have questions after your visit to [facility], please contact our office at [phone] and follow the prompts.  Our office hours are 8:00 a.m. to 4:30 p.m.  Monday - Thursday and 8:00 a.m. to 2:30 p.m. Friday.  Please note that voicemails left after 4:00 p.m. may not be returned until the following business day.  We are closed weekends and all major holidays.  You do have access to a nurse 24-7, just call the main number to the clinic [phone] and do not press any options, hold on the line and a nurse will answer the phone.    For prescription refill requests, have your pharmacy contact our office and allow 72 hours.    Masks are no longer required in the cancer centers. If you would like for your care team to wear a mask while they are taking care of you, please let them know. You may have one support person who is at least 88 years old accompany you for your appointments.

## 2024-05-02 NOTE — Progress Notes (Signed)
 [facility] Clinical Social Work  Initial Assessment   Cody Palmer is a 89 y.o. year old male . CSW contacted pt's daughter [name] to obtain details contained in assessment.  Clinical Social Work was referred by medical provider for assessment of psychosocial needs.   SDOH (Social Determinants of Health) assessments performed: Yes   SDOH Screenings   Food Insecurity: No Food Insecurity (04/10/2024)  Housing: Low Risk (04/10/2024)  Transportation Needs: No Transportation Needs (04/10/2024)  Utilities: Not At Risk (04/10/2024)  Social Connections: Moderately Isolated (04/10/2024)  Tobacco Use: Medium Risk (05/01/2024)    PHQ 2/9:     No data to display           Distress Screen completed: No     No data to display            Family/Social Information:  Housing Arrangement: patient lives alone; however, was recently discharged from the hospital and is currently at [facility] for rehab.  Per daughter pt is anticipated to return home after rehab.  [name] and son-in-law who reside in [place] will be staying w/ pt for a couple of weeks following discharge from rehab.  Prior to hospitalization pt was independent in all ADLs. Family members/support persons in your life? Pt has a son and daughter who reside locally, both work full time but provide support as they are able.  Transportation concerns: not at this time  Employment: Retired .  Income source: Actor concerns: No Type of concern: None Food access concerns: no Religious or spiritual practice: Yes-[employer] Advanced directives: Yes-on chart Services Currently in place:  none  Coping/ Adjustment to diagnosis: Patient understands treatment plan and what happens next? yes Concerns about diagnosis and/or treatment: Overwhelmed by information, How will I care for myself, and Quality of life Patient reported stressors: Adjusting to my illness Hopes and/or priorities: pt's priority is to  continue treatment w/ the hope of positive results. Patient enjoys not addressed Current coping skills/ strengths: Motivation for treatment/growth  and Supportive family/friends     SUMMARY: Current SDOH Barriers:  No barriers identified at this time.  Clinical Social Work Clinical Goal(s):  No clinical social work goals at this time  Interventions: Discussed common feeling and emotions when being diagnosed with cancer, and the importance of support during treatment Informed patient of the support team roles and support services at [facility] Provided CSW contact information and encouraged patient to call with any questions or concerns Will refer to [facility] post d/c from SNF.  [name] provided w/ contact details for CSW should there be additional concerns post d/c from SNF.   Follow Up Plan: Patient will contact CSW with any support or resource needs Patient verbalizes understanding of plan: Yes    Devere JONELLE Manna, LCSW Clinical Social Worker [facility]

## 2024-05-02 NOTE — Progress Notes (Signed)
 Hematology-Oncology Clinic Note  [name], [name], NP   Reason for Referral: Follicular lymphoma  Oncology History: I have reviewed his chart and materials related to his cancer extensively and collaborated history with the patient. 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 -03/27/2024: Immature Cell Panel: Metamyelocytes : 3, Myelocytes 2, Uncertain lineage: 13.  -04/04/2024: Peripheral Blood Flow Cytometry: Clonal B-cell population comprising 37% of the lymphocytes.  The cells are positive for CD19, CD20, CD22, CD11c and express lambda light chains.  -04/04/2024: LDH 1,128 -04/04/2024: CT [facility]: Interval increased ill-defined soft tissue density throughout the base of the mesentery with diffuse stranding of the mesenteric fat and new ascites. Findings are nonspecific, and potentially still inflammatory, but suspicious for lymphoma given the associated enlarged pericardiac, retroperitoneal and inguinal lymph nodes. Persistent wall thickening of the ascending and transverse colon with increased wall thickening of the terminal ileum. These findings could be secondary to infection, inflammatory bowel disease or lymphedema. No evidence of bowel obstruction or perforation. -04/05/2024: CT chest: No evidence of thoracic metastasis.  -04/15/2024: CT [facility]:  Grossly similar ill-defined soft tissue throughout the base of the mesentery. Increased small to moderate abdominopelvic ascites since 03/27/24. Wall thickening about the terminal ileum and ascending and transverse colon, grossly similar to prior given differences in contrast administration. Unchanged left periaortic, pericardiac, and left inguinal lymphadenopathy. -04/17/2024: Paracentesis yielding 2.2 liters of fluid.   Cytology: Lymphoma  Flow cytometry: Tight clonal cluster of CD10, CD19, CD20, and CD38 positive B cells comprising 54% of gated population. The tight clonal cluster of B cells shows dim light chain positivity of a clinically occult light chain restriction.  This is due to the presence of the clonal cluster very low/dim kappa/lambda borderline expression making definitive light chain restriction difficult to interpret.  -04/17/2024: Left groin lymph node biopsy.  Pathology: Follicular lymphoma. The cells of interest are CD20 positive B cells which coexpress CD10, BCL6 and aberrantly strongly Bcl-2. There is a relative loss of the follicular dendritic cell meshwork and the predominant CD23 positive cells are the cells of interest. The proliferation rate by Ki-67 is quite low (<5%). CD3, CD5 and CD43 highlight background small T cells. Cyclin D1 is negative in the cells of interest. CK AE1/AE3 is negative.  Flow Cytometry: Tight clonal cluster of CD10, CD19, CD20, and CD38 positive B cells comprising 88% of gated population. -04/17/2024: Uric Acid- 20.4. LDH- 1,263. Phosphorous- 6.4 -04/17/2024 - current: Patient requiring weekly paracentesis yielding multiple liters each time.  -04/18/2024: Hepatitis B S AB quant <3.5 -04/21/2024: Uric acid 7.4 -04/24/2024: Albumin - 3.0. AST- 47. ALT- 40. Alkaline Phosphatase- 152. Phosphorous- 2.9. -05/02/2024: CMP: AST- 453, ALT- 297, Alkaline Phosphatase- 1,402, Total bilirubin- 3.3.  -05/02/2024: Initial PET: Results Pending   History of Presenting Illness: Cody Palmer is a 89 y.o. male referred today for follicular lymphoma by Maree Bracken D, DO. He is accompanied by his son and daughter.   Patient has a medical history of hypertension, DM type II, CKD stage 3, and arthritis.   Patient was recently admitted to the hospital with abdominal distention and imaging at that time showed diffuse lymphadenopathy with eccentric mass.  Biopsy of the groin lymph node  was consistent with follicular lymphoma but patient had labs consistent with TLS at that time.  Patient later had recurrent ascites  requiring paracentesis flow cytometry at that time was consistent with lymphoma followed by groin lymph node biopsy consistent with follicular lymphoma.  Patient is scheduled for PET scan today.  Patient is currently residing at [facility].  We discussed lymphoma diagnosis, including its behavior as an aggressive lymphoma and treatment. Cody Palmer states despite having paracentesis, his ascites is not improving and fluid builds up quickly in his abdomen. While hospitalized, he received steroids and was unsure if this improved his symptoms and energy. He experiences generalized weakness that is relatively new in onset with lymphoma diagnosis and is receiving physical therapy at the [facility] where he is currently residing.   Patient was living by himself prior to this and was in very good functional status.  Medical History: Past Medical History:  Diagnosis Date   Arthritis    Cancer (HCC)    Skin cancer   Diabetes mellitus without complication (HCC)    HTN (hypertension)    Hypercholesteremia     Surgical history: Past Surgical History:  Procedure Laterality Date   CATARACT EXTRACTION W/PHACO Left 04/25/2016   Procedure: CATARACT EXTRACTION PHACO AND INTRAOCULAR LENS PLACEMENT LEFT EYE;  Surgeon: Cherene Mania, MD;  Location: [facility];  Service: Ophthalmology;  Laterality: Left;  CDE: 10.37   COLONOSCOPY N/A 04/05/2024   Procedure: COLONOSCOPY;  Surgeon: Cindie Carlin POUR, DO;  Location: [facility] SU[facility];  Service: Endoscopy;  Laterality: N/A;   CYSTOSCOPY WITH BIOPSY N/A 08/13/2013   Procedure: CYSTOSCOPY WITH BLADDER BIOPSY;  Surgeon: Garnette Shack, MD;  Location: [facility];  Service: Urology;  Laterality: N/A;   ENUCLEATION Right    Removed in 1958   EYE SURGERY  1958   removal of eye   TOTAL KNEE ARTHROPLASTY Right 01/11/2013   Procedure: RIGHT TOTAL KNEE  ARTHROPLASTY;  Surgeon: Taft FORBES Minerva, MD;  Location: [facility];  Service: Orthopedics;  Laterality: Right;   TOTAL KNEE ARTHROPLASTY Left 07/12/2022   Procedure: TOTAL KNEE ARTHROPLASTY;  Surgeon: Minerva Taft FORBES, MD;  Location: [facility];  Service: Orthopedics;  Laterality: Left;     Allergies:  has no known allergies.  Medications:  No current facility-administered medications for this visit.   No current outpatient medications on file.   Facility-Administered Medications Ordered in Other Visits  Medication Dose Route Frequency Provider Last Rate Last Admin   albuterol  (PROVENTIL ) (2.5 MG/3ML) 0.083% nebulizer solution 2.5 mg  2.5 mg Nebulization Q2H PRN Elgergawy, Dawood S, MD       [START ON 05/03/2024] allopurinol  (ZYLOPRIM ) tablet 150 mg  150 mg Oral Daily Elgergawy, Dawood S, MD       cefTRIAXone  (ROCEPHIN ) 2 g in sodium chloride  0.9 % 100 mL IVPB  2 g Intravenous Q24H Elgergawy, Dawood S, MD       [START ON 05/03/2024] Chlorhexidine  Gluconate Cloth 2 % PADS 6 each  6 each Topical Q0600 Johnson, Clanford L, MD       [START ON 05/03/2024] heparin  injection 5,000 Units  5,000 Units Subcutaneous Q8H Elgergawy, Brayton RAMAN, MD       hydrALAZINE  (APRESOLINE ) injection 5 mg  5 mg Intravenous Q4H PRN Elgergawy, Dawood S, MD       oxyCODONE  (Oxy IR/ROXICODONE ) immediate release tablet 5 mg  5 mg Oral Q6H PRN Elgergawy, Dawood S, MD       pantoprazole  (PROTONIX ) EC tablet 40 mg  40 mg Oral Daily Elgergawy, Dawood S, MD       potassium chloride  SA (KLOR-CON  M) CR tablet 40 mEq  40  mEq Oral Once Elgergawy, Brayton RAMAN, MD         Physical Examination: ECOG PERFORMANCE STATUS: 2 - Symptomatic, <50% confined to bed  Vitals:   05/02/24 1125  BP: (!) 131/56  Pulse: 100  Resp: 19  Temp: 98 F (36.7 C)  SpO2: 95%   Filed Weights   05/02/24 1125  Weight: 202 lb (91.6 kg)    GENERAL: Frail male in no acute distress. SKIN: skin color, texture, turgor are normal, no rashes or significant  lesions LYMPH:  no palpable lymphadenopathy in the cervical, axillary or inguinal LUNGS: clear to auscultation and percussion with normal breathing effort HEART: regular rate & rhythm and no murmurs and no lower extremity edema ABDOMEN: Distended.  Normal bowel sounds.  Urinary catheter in place. PSYCH: alert & oriented x 3 with fluent speech   Laboratory Data: I have reviewed the data as listed Lab Results  Component Value Date   WBC 9.3 05/02/2024   HGB 10.7 (L) 05/02/2024   HCT 33.4 (L) 05/02/2024   MCV 86.8 05/02/2024   PLT 261 05/02/2024      Chemistry      Component Value Date/Time   NA 143 05/02/2024 0430   NA 139 03/27/2024 1513   K 3.3 (L) 05/02/2024 0430   CL 102 05/02/2024 0430   CO2 34 (H) 05/02/2024 0430   BUN 35 (H) 05/02/2024 0430   BUN 32 (H) 03/27/2024 1513   CREATININE 1.34 (H) 05/02/2024 0430      Component Value Date/Time   CALCIUM  7.6 (L) 05/02/2024 0430   ALKPHOS 1,402 (H) 05/02/2024 0430   AST 453 (H) 05/02/2024 0430   ALT 297 (H) 05/02/2024 0430   BILITOT 3.3 (H) 05/02/2024 0430   BILITOT 0.7 03/27/2024 1513       Latest Reference Range & Units 05/02/24 13:49  Phosphorus 2.5 - 4.6 mg/dL 3.8  Magnesium  1.7 - 2.4 mg/dL 2.0  Uric Acid, Serum 3.7 - 8.6 mg/dL 8.0  LDH 894 - 764 U/L 1,517 (H)  (H): Data is abnormally high  Radiographic Studies: I have personally reviewed the radiological images as listed and agreed with the findings in the report.  EXAM: CT ABDOMEN AND PELVIS WITHOUT CONTRAST 04/15/2024 10:17:55 PM   TECHNIQUE: CT of the abdomen and pelvis was performed without the administration of intravenous contrast. Multiplanar reformatted images are provided for review. Automated exposure control, iterative reconstruction, and/or weight-based adjustment of the mA/kV was utilized to reduce the radiation dose to as low as reasonably achievable.   COMPARISON: Comparison with the 04/04/2024   CLINICAL HISTORY: Abdominal pain, acute,  nonlocalized.   FINDINGS:   LOWER CHEST: Trace bilateral pleural effusions.   LIVER: The liver is unremarkable.   GALLBLADDER AND BILE DUCTS: Similar reactive gallbladder wall thickening. No biliary ductal dilatation.   SPLEEN: No acute abnormality.   PANCREAS: Unchanged ill-defined soft tissue about the pancreatic head. This was better assessed on prior CT with contrast.   ADRENAL GLANDS: No acute abnormality.   KIDNEYS, URETERS AND BLADDER: No stones in the kidneys or ureters. No hydronephrosis. No perinephric or periureteral stranding. Foley catheter in the decompressed bladder.   GI AND BOWEL: Oral contrast is present within the stomach and small bowel. There is dilute contrast in the colon. Wall thickening about the terminal ileum and ascending and transverse colon is grossly similar to prior given differences in contrast administration. There is no bowel obstruction.   PERITONEUM AND RETROPERITONEUM: Increased small to moderate abdominal pelvic ascites.  Diffuse mesenteric stranding is similar. Redemonstrated ill-defined soft tissue density throughout the base of the mesentery. This was better evaluated with CT with IV contrast on 04/04/2024 but is grossly similar. No free air.   VASCULATURE: Aorta is normal in caliber. Aortic atherosclerotic calcification.   LYMPH NODES: Unchanged left periaortic lymphadenopathy measuring up to 1.7 cm on series 2 image 45. Similar enlargement of a left inguinal lymph node measuring 1.3 cm. Similar enlargement of a pericardial lymph node measuring 1.8 cm on series 2 image 14.   REPRODUCTIVE ORGANS: No acute abnormality.   BONES AND SOFT TISSUES: No acute osseous abnormality. No focal soft tissue abnormality.   IMPRESSION: 1. Grossly similar ill-defined soft tissue throughout the base of the mesentery. 2. Increased small to moderate abdominopelvic ascites since 11 / 12 / 25 . 3. Wall thickening about the terminal ileum and  ascending and transverse colon, grossly similar to prior given differences in contrast administration. 4. Unchanged left periaortic, pericardiac, and left inguinal lymphadenopathy.   Electronically signed by: Norman Gatlin MD 04/16/2024 02:38 AM EST RP Workstation: [id number]  CT CHEST WITH CONTRAST 04/05/2024 03:37:22 PM   TECHNIQUE: CT of the chest was performed with the administration of 75 mL of iohexol  (OMNIPAQUE ) 300 MG/ML solution. Multiplanar reformatted images are provided for review. Automated exposure control, iterative reconstruction, and/or weight based adjustment of the mA/kV was utilized to reduce the radiation dose to as low as reasonably achievable.   COMPARISON: CT 04/04/2024 and CT abdomen and pelvis 1 day prior.   CLINICAL HISTORY: Lymphadenopathy on recent CT abdomen - needs CT chest to assess for further lymphadenopathy/possible biopsy in IR. * Tracking Code: BO *   FINDINGS:   MEDIASTINUM: Heart is unremarkable. No pericardial fluid. The central airways are clear.   LYMPH NODES: Bilateral small axillary lymph nodes. No Supraclavicular lymph nodes. No enlargement of the mediastinal lymph nodes. No hilar lymphadenopathy.   LUNGS AND PLEURA: No focal consolidation or pulmonary edema. No pleural effusion or pneumothorax. No suspicious pulmonary nodules. Mild thickening along the pleural surface of the fissures.   SOFT TISSUES/BONES: No aggressive osseous lesion. No acute abnormality of the soft tissues.   UPPER ABDOMEN: Limited view of the upper abdomen demonstrates infiltrative process in the upper abdominal mesentery as seen on recent CT of the abdomen and pelvis. Findings suggestive of carcinomatosis. A small enhancing lesion in the right hepatic lobe on image 119 is also described on comparison CT.   IMPRESSION: 1. No evidence of thoracic metastasis. 2. Infiltrative process in the upper abdominal mesentery suggestive of peritoneal  carcinomatosis. 3. See CT abdomen and pelvis 1 day prior.   Electronically signed by: Norleen Boxer MD 04/05/2024 03:54 PM EST RP Workstation: [id number]  ASSESSMENT & PLAN:  Patient is a 89 y.o. male presenting for follicular lymphoma and treatment  Assessment and Plan  Non-Hodgkin lymphoma/follicular lymphoma Likely stage IV considering diffuse lymphadenopathy and mesenteric mass. Even though biopsy is consistent with follicular lymphoma, patient has been behaving like an aggressive lymphoma.  - Labs reviewed today:CMP: Potassium: 3.3, creatinine: 1.34, AST: 453, ALT: 297, total bilirubin: 3.3, alkaline phosphatase: 1402, LDH: 1517.  CBC: Hemoglobin: 10.7, platelets and WBC normal - Considering the above labs, will start rituximab treatment sooner. - With the extent of disease and risk of TLS, will consider doing inpatient rituximab tomorrow.  Will contact hospitalist team for admission to the hospital. - Recommend doing TLS labs-CBC with differential, CMP, phosphorus, LDH, uric acid every 12 hours. - Will order rituximab  for tomorrow.  Plan to start at 8:30 AM -Previous hepatitis panel was negative -Will consider starting steroids based on patient's status tomorrow. - Will await on PET scan results. - Can consider rebiopsy of a higher SUV area to rule out DLBCL - If all the path is consistent with follicular lymphoma, considering high tumor burden will add Bendamustine outpatient. - Hematology will closely follow-up while inpatient  Return to clinic after discharge for follow-up.  Ascites Likely secondary to lymphoma -Continue symptomatic paracentesis.  Please make sure to send flow cytometry on the specimen.  Elevated liver enzymes Likely secondary to liver involvement with lymphoma versus lymphadenopathy  - Continue to monitor for now   Orders Placed This Encounter  Procedures   Uric acid    Standing Status:   Future    Number of Occurrences:   1    Expected Date:    05/02/2024    Expiration Date:   07/31/2024    Release to patient:   Immediate   Lactate dehydrogenase    Standing Status:   Future    Number of Occurrences:   1    Expected Date:   05/02/2024    Expiration Date:   07/31/2024   Phosphorus    Standing Status:   Future    Number of Occurrences:   1    Expected Date:   05/02/2024    Expiration Date:   07/31/2024    Release to patient:   Immediate   CBC with Differential    Standing Status:   Future    Number of Occurrences:   1    Expected Date:   05/02/2024    Expiration Date:   05/02/2025    The total time spent in the appointment was 87 minutes encounter with patients including review of chart and various tests results, discussions about plan of care and coordination of care plan   All questions were answered. The patient knows to call the clinic with any problems, questions or concerns. No barriers to learning was detected.   LILLETTE Verneta SAUNDERS Teague,acting as a neurosurgeon for Mickiel Dry, MD.,have documented all relevant documentation on the behalf of Mickiel Dry, MD,as directed by  Mickiel Dry, MD while in the presence of Mickiel Dry, MD.  I, Mickiel Dry MD, have reviewed the above documentation for accuracy and completeness, and I agree with the above.    Mickiel Dry, MD 12/18/20254:49 PM

## 2024-05-02 NOTE — Progress Notes (Signed)
 Patient to be directly admitted today. Report given to Kristi, RN on 300. Patient transported to room 314 in stable condition.

## 2024-05-02 NOTE — TOC Initial Note (Signed)
 Transition of Care [facility]) - Initial/Assessment Note    Patient Details  Name: Cody Palmer MRN: [id number] Date of Birth: 04-09-1933  [facility]) CM/SW Contact:    Lucie Lunger, LCSWA Phone Number: 05/02/2024, 3:36 PM  Clinical Narrative:                 CSW notes per chart review that pt has been at [facility] for SNF. CSW spoke to [place] in admissions who confirms pt has been at their facility for SNF placement. CSW to follow for medical workup and PT evaluation recommendations. TOC to follow.   Expected Discharge Plan: Skilled Nursing Facility Barriers to Discharge: Continued Medical Work up   Patient Goals and CMS Choice Patient states their goals for this hospitalization and ongoing recovery are:: get better CMS Medicare.gov Compare Post Acute Care list provided to:: Patient Choice offered to / list presented to : Patient, Adult Children      Expected Discharge Plan and Services In-house Referral: Clinical Social Work Discharge Planning Services: CM Consult   Living arrangements for the past 2 months: Single Family Home                                      Prior Living Arrangements/Services Living arrangements for the past 2 months: Single Family Home Lives with:: Self Patient language and need for interpreter reviewed:: Yes Do you feel safe going back to the place where you live?: Yes      Need for Family Participation in Patient Care: Yes (Comment) Care giver support system in place?: Yes (comment)   Criminal Activity/Legal Involvement Pertinent to Current Situation/Hospitalization: No - Comment as needed  Activities of Daily Living   ADL Screening (condition at time of admission) Independently performs ADLs?: No Does the patient have a NEW difficulty with bathing/dressing/toileting/self-feeding that is expected to last >3 days?: Yes (Initiates electronic notice to provider for possible OT consult) Does the patient have a NEW difficulty with  getting in/out of bed, walking, or climbing stairs that is expected to last >3 days?: Yes (Initiates electronic notice to provider for possible PT consult) Does the patient have a NEW difficulty with communication that is expected to last >3 days?: No Is the patient deaf or have difficulty hearing?: Yes Does the patient have difficulty seeing, even when wearing glasses/contacts?: No Does the patient have difficulty concentrating, remembering, or making decisions?: No  Permission Sought/Granted                  Emotional Assessment Appearance:: Appears stated age Attitude/Demeanor/Rapport: Engaged Affect (typically observed): Accepting   Alcohol / Substance Use: Not Applicable Psych Involvement: No (comment)  Admission diagnosis:  Non Hodgkin's lymphoma (HCC) [C85.90] Patient Active Problem List   Diagnosis Date Noted   CKD stage 3 due to type 2 diabetes mellitus (HCC) 04/29/2024   Type 2 diabetes mellitus with chronic kidney disease, without long-term current use of insulin  (HCC) 04/29/2024   Hyperlipidemia associated with type 2 diabetes mellitus (HCC) 04/29/2024   GERD without esophagitis 04/29/2024   Chronic non-seasonal allergic rhinitis 04/29/2024   Bilateral lower extremity edema 04/29/2024   Hypertension associated with stage 3b chronic kidney disease due to type 2 diabetes mellitus (HCC) 04/29/2024   Chronic constipation 04/29/2024   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/23/2024   Malnutrition of moderate degree 04/16/2024   AKI (acute kidney injury) 04/10/2024   Generalized weakness 04/10/2024  Failure to thrive in adult 04/10/2024   DNR (do not resuscitate) 04/10/2024   Abdominal distention 04/04/2024   Arthritis of left knee 07/12/2022   Osteoarthritis of left knee 07/12/2022   Chronic kidney disease, stage 3a (HCC) 12/07/2021   Essential hypertension 03/08/2021   Hyperlipidemia 03/08/2021   Osteoarthrosis, unspecified whether generalized or localized, lower leg  12/31/2013   S/P total knee replacement 02/21/2013   Stiffness of right knee 01/28/2013   Weakness of right leg 01/28/2013   Difficulty walking 01/28/2013   Arthritis of knee, degenerative 12/13/2012   PCP:  [name], NP Pharmacy:   [facility] [place] [place] - [address] [place] [address] Phone: [phone] Fax: [phone]     Social Drivers of Health (SDOH) Social History: SDOH Screenings   Food Insecurity: No Food Insecurity (05/02/2024)  Housing: Patient Declined (05/02/2024)  Transportation Needs: Patient Declined (05/02/2024)  Utilities: Patient Declined (05/02/2024)  Depression (PHQ2-9): Low Risk (05/02/2024)  Social Connections: Patient Declined (05/02/2024)  Recent Concern: Social Connections - Moderately Isolated (04/10/2024)  Tobacco Use: Medium Risk (05/02/2024)   SDOH Interventions:     Readmission Risk Interventions    04/19/2024    1:46 PM  Readmission Risk Prevention Plan  Transportation Screening Complete  PCP or Specialist Appt within 3-5 Days Not Complete  HRI or Home Care Consult Complete  Social Work Consult for Recovery Care Planning/Counseling Complete  Palliative Care Screening Not Applicable  Medication Review Oceanographer) Complete

## 2024-05-02 NOTE — H&P (Signed)
 [facility] H&P   Patient Demographics:    Cody Palmer, is a 89 y.o. male  MRN: [id number]   DOB - Oct 07, 1932  Admit Date - 05/02/2024  Outpatient Primary MD for the patient is [name], [name], NP   Outpatient Specialists: oncology Dr davonna    Patient coming from: Direct admission from outpatient oncology  No chief complaint on file.     HPI:    Cody Palmer  is a 89 y.o. male, with stage IIIa CKD, hypertension, hyperlipidemia, type 2 diabetes mellitus, controlled, with recent hospitalization due to AKI, workup significant for non-Hodgkin's lymphoma. - Patient started on antibiotic during recent hospitalization for SBP, and recurrent ascites, fluid biopsy was significant for none Hodgkin's lymphoma, he had paracentesis yesterday with 4 L drained, follow-up with the department this morning, where they recommend initiation of  immunotherapy given he is high risk for tumor lysis syndrome admission has been recommended. - Labs and workup at oncology clinic today was elevated liver enzymes, creatinine at baseline, potassium low at 3.3, he had paracentesis yesterday done with 4 L drained, does not appear there were any labs sent.  Have discussed with oncology, who will initiate rituximab immunotherapy tomorrow at 8:30 a.m., premedications and rituximab although will be taking care of by oncology .   Review of systems:     A full 10 point Review of Systems was done, except as stated above, all other Review of Systems were negative.   With Past History of the following :    Past Medical History:  Diagnosis Date   Arthritis    Cancer (HCC)    Skin cancer   Diabetes mellitus without complication (HCC)    HTN (hypertension)    Hypercholesteremia       Past Surgical History:  Procedure Laterality Date   CATARACT EXTRACTION W/PHACO Left 04/25/2016   Procedure: CATARACT  EXTRACTION PHACO AND INTRAOCULAR LENS PLACEMENT LEFT EYE;  Surgeon: Cherene Mania, MD;  Location: [facility];  Service: Ophthalmology;  Laterality: Left;  CDE: 10.37   COLONOSCOPY N/A 04/05/2024   Procedure: COLONOSCOPY;  Surgeon: Cindie Carlin POUR, DO;  Location: [facility];  Service: Endoscopy;  Laterality: N/A;   CYSTOSCOPY WITH BIOPSY N/A 08/13/2013   Procedure: CYSTOSCOPY WITH BLADDER BIOPSY;  Surgeon: Garnette Shack, MD;  Location: [facility];  Service: Urology;  Laterality: N/A;   ENUCLEATION Right    Removed in 1958   EYE SURGERY  1958   removal of eye   TOTAL KNEE ARTHROPLASTY Right 01/11/2013   Procedure: RIGHT TOTAL KNEE ARTHROPLASTY;  Surgeon: Taft FORBES Minerva, MD;  Location: [facility];  Service: Orthopedics;  Laterality: Right;   TOTAL KNEE ARTHROPLASTY Left 07/12/2022   Procedure: TOTAL KNEE ARTHROPLASTY;  Surgeon: Minerva Taft FORBES, MD;  Location: [facility];  Service: Orthopedics;  Laterality: Left;      Social History:     Social History  Tobacco Use   Smoking status: Former    Current packs/day: 0.00    Average packs/day: 1 pack/day for 8.0 years (8.0 ttl pk-yrs)    Types: Cigarettes    Start date: 01/07/1957    Quit date: 01/07/1965    Years since quitting: 59.3   Smokeless tobacco: Never  Substance Use Topics   Alcohol use: No       Family History :     Family History  Problem Relation Age of Onset   Arthritis Unknown    Cancer Unknown    Diabetes Unknown      Home Medications:   Prior to Admission medications  Medication Sig Start Date End Date Taking? Authorizing Provider  allopurinol  (ZYLOPRIM ) 300 MG tablet Take 0.5 tablets (150 mg total) by mouth daily. 04/25/24   Ricky Fines, MD  aspirin  EC 81 MG tablet Take 81 mg by mouth daily. Swallow whole.    [provider]  Cholecalciferol  (VITAMIN D3) 25 MCG (1000 UT) CAPS Take 1,000 Units by mouth daily.    [provider]  ciprofloxacin  (CIPRO ) 500 MG tablet Take 1 tablet (500 mg total)  by mouth 2 (two) times daily for 5 days. 04/24/24 05/02/24  Ricky Fines, MD  fluticasone  (FLONASE ) 50 MCG/ACT nasal spray Place 2 sprays into both nostrils daily. 04/25/24   Ricky Fines, MD  furosemide  (LASIX ) 40 MG tablet Take 1 tablet (40 mg total) by mouth daily. 04/24/24 04/24/25  Ricky Fines, MD  labetalol  (NORMODYNE ) 100 MG tablet Take 1 tablet (100 mg total) by mouth 2 (two) times daily. 04/24/24   Ricky Fines, MD  loratadine  (CLARITIN ) 10 MG tablet Take 1 tablet (10 mg total) by mouth daily. 04/25/24   Ricky Fines, MD  Multiple Vitamins-Minerals (EYE HEALTH AREDS 2 PO) Take 1 capsule by mouth 2 (two) times daily.    [provider]  Nutritional Supplements (GLUCERNA 1.0 CAL/FIBER) LIQD Take 1 Bottle by mouth 2 (two) times daily between meals. 04/24/24   Ricky Fines, MD  omeprazole (PRILOSEC) 40 MG capsule Take 40 mg by mouth daily.    [provider]  oxyCODONE  (OXY IR/ROXICODONE ) 5 MG immediate release tablet Take 1 tablet (5 mg total) by mouth every 6 (six) hours as needed for severe pain (pain score 7-10). 04/24/24   Ricky Fines, MD  polyethylene glycol (MIRALAX ) 17 g packet Take 17 g by mouth daily as needed for mild constipation or moderate constipation. 04/06/24   Maree, Pratik D, DO  prochlorperazine  (COMPAZINE ) 10 MG tablet Take 1 tablet (10 mg total) by mouth every 6 (six) hours as needed for nausea or vomiting. 04/23/24   Kandala, Hyndavi, MD  rosuvastatin  (CRESTOR ) 10 MG tablet Take 10 mg by mouth every other day.    [provider]  simethicone  (MYLICON) 80 MG chewable tablet Chew 1 tablet (80 mg total) by mouth every 6 (six) hours as needed for flatulence. 04/06/24   Maree, Pratik D, DO  SITagliptin  25 MG TABS Take 12.5 mg by mouth daily at 6 (six) AM. 04/24/24   Ricky Fines, MD     Allergies:    Allergies[1]   Physical Exam:   Vitals  Blood pressure 124/62, pulse 99, temperature 97.8 F (36.6 C), temperature source  Axillary, SpO2 93%.   1. General Frail, chronically appearing male, laying in bed in no apparent distress  2. Normal affect and insight, Not Suicidal or Homicidal, Awake Alert, Oriented X 3.  3. No F.N deficits, ALL C.Nerves Intact, Strength 5/5  all 4 extremities, Sensation intact all 4 extremities, Plantars down going.  4. Ears  appear Normal, right artificial eye, mildly icteric  5. Supple Neck,  No Carotid Bruits.  6. Symmetrical Chest wall movement, Good air movement bilaterally, diminished at the bases  7.  Tachycardic, No Gallops, Rubs or Murmurs, No Parasternal Heave.  Has + 2 edema  8. Positive Bowel Sounds, Abdomen distended, but soft, nontender with no significant ascites wave could be appreciated, Foley present  9.  No Cyanosis, Normal Skin Turgor, No Skin Rash or Bruise.  10. Good muscle tone,  joints appear normal , no effusions, Normal ROM.    Data Review:    CBC Recent Labs  Lab 05/02/24 0430 05/02/24 1349  WBC 8.2 9.3  HGB 9.6* 10.7*  HCT 30.4* 33.4*  PLT 236 261  MCV 87.9 86.8  MCH 27.7 27.8  MCHC 31.6 32.0  RDW 19.6* 19.7*  LYMPHSABS  --  2.6  MONOABS  --  0.5  EOSABS  --  0.3  BASOSABS  --  0.1   ------------------------------------------------------------------------------------------------------------------  Chemistries  Recent Labs  Lab 05/02/24 0430 05/02/24 1349  NA 143  --   K 3.3*  --   CL 102  --   CO2 34*  --   GLUCOSE 95  --   BUN 35*  --   CREATININE 1.34*  --   CALCIUM  7.6*  --   MG  --  2.0  AST 453*  --   ALT 297*  --   ALKPHOS 1,402*  --   BILITOT 3.3*  --    ------------------------------------------------------------------------------------------------------------------ estimated creatinine clearance is 41.8 mL/min (A) (by C-G formula based on SCr of 1.34 mg/dL (H)). ------------------------------------------------------------------------------------------------------------------ No results for input(s): TSH,  T4TOTAL, T3FREE, THYROIDAB in the last 72 hours.  Invalid input(s): FREET3  Coagulation profile No results for input(s): INR, PROTIME in the last 168 hours. ------------------------------------------------------------------------------------------------------------------- No results for input(s): DDIMER in the last 72 hours. -------------------------------------------------------------------------------------------------------------------  Cardiac Enzymes No results for input(s): CKMB, TROPONINI, MYOGLOBIN in the last 168 hours.  Invalid input(s): CK ------------------------------------------------------------------------------------------------------------------ No results found for: BNP   ---------------------------------------------------------------------------------------------------------------  Urinalysis    Component Value Date/Time   COLORURINE YELLOW 04/15/2024 1236   APPEARANCEUR HAZY (A) 04/15/2024 1236   LABSPEC 1.016 04/15/2024 1236   PHURINE 5.0 04/15/2024 1236   GLUCOSEU NEGATIVE 04/15/2024 1236   HGBUR NEGATIVE 04/15/2024 1236   BILIRUBINUR NEGATIVE 04/15/2024 1236   KETONESUR NEGATIVE 04/15/2024 1236   PROTEINUR NEGATIVE 04/15/2024 1236   UROBILINOGEN 2.0 (H) 07/04/2013 1917   NITRITE NEGATIVE 04/15/2024 1236   LEUKOCYTESUR NEGATIVE 04/15/2024 1236    ----------------------------------------------------------------------------------------------------------------   Imaging Results:    US  Paracentesis Result Date: 05/01/2024 INDICATION: 89 year old male. History of lymphoma with recurrent malignant ascites. Request is for therapeutic paracentesis EXAM: ULTRASOUND GUIDED THERAPEUTIC RIGHT-SIDED PARACENTESIS MEDICATIONS: Lidocaine  1% 10 mL COMPLICATIONS: None immediate. PROCEDURE: Informed written consent was obtained from the patient after a discussion of the risks, benefits and alternatives to treatment. A timeout was performed prior  to the initiation of the procedure. Initial ultrasound scanning demonstrates a moderate amount of ascites within the right lower abdominal quadrant. The right lower abdomen was prepped and draped in the usual sterile fashion. 1% lidocaine  was used for local anesthesia. Following this, a 19 gauge, 7-cm, Yueh catheter was introduced. An ultrasound image was saved for documentation purposes. The paracentesis was performed. The catheter was removed and a dressing was applied. The patient tolerated the procedure well without immediate post  procedural complication. FINDINGS: A total of approximately 4 L of straw-colored fluid was removed. IMPRESSION: Successful ultrasound-guided therapy paracentesis yielding 4 liters of straw-colored peritoneal fluid. Performed by Delon Beagle NP Electronically Signed   By: Wilkie Lent M.D.   On: 05/01/2024 13:01    My personal review of EKG: Obtain EKG for baseline.      Assessment & Plan:    Principal Problem:   Non Hodgkin's lymphoma (HCC) Active Problems:   Chronic kidney disease, stage 3a (HCC)   Essential hypertension   Hyperlipidemia   Follicular lymphoma (HCC)   GERD without esophagitis  None Hodgkin lymphoma - Oncology input greatly appreciated, plan to initiate immunotherapy yesterday with rituximab, oncology will arrange for rituximab infusion to be given at 8:30 AM as discussed with Dr. Jillian. - Premedications per oncology. - Check CMP, CBC, uric acid, phosphorus and LDH every 12 hours.  Transaminits Recuurent ascites Hyperbilirubinemia -LFTs significantly elevated, acute finding in the liver and CT abdomen pelvis 04/15/2024 - PET scan done today, await reading. - Will start with right upper quadrant ultrasound.  Unfortunately he just ate, so as discussed with ultrasound tech, it will be done first thing in the morning - Concern will be for liver mets versus obstructive jaundice - Status post paracentesis yesterday with 4 L drained, as  discussed with oncology if enough fluid accumulated by tomorrow then would likely need another paracentesis but this time will need flow cytometry sent as well - Recent cultures growing E. coli with concern of SBP, will start on IV Rocephin   CKD stage IIIb -Creatinine at baseline, -Monitor closely has high risk for tumor lysis syndrome, even though some evidence of volume overload I will hold on diuresis right now   Essential hypertension  - Blood pressure is stable, -Would like to avoid hypotension so we will hold home labetalol  and will keep on as needed hydralazine .  Diabetes mellitus, type II -Hold oral agents and keep on insulin  sliding scale  Weakness/deconditioning -Consult PT/OT   Hypokalemia -Replaced(will give only 40 mEq  once as concern for tumor lysis syndrome in the near future with immunotherapy)   DVT Prophylaxis Heparin   AM Labs Ordered, also please review Full Orders  Family Communication: Admission, patients condition and plan of care including tests being ordered have been discussed with the patient and daughter at bedsidewho indicate understanding and agree with the plan and Code Status.  Code Status DNR  Likely DC to  pending work up   Condition GUARDED   Consults called: oncology    Admission status: inpatient    Time spent in minutes : 70 minutes   Brayton Lye M.D on 05/02/2024 at 3:55 PM   [facility] - Office  [phone]        [1] No Known Allergies

## 2024-05-02 NOTE — Evaluation (Signed)
 Physical Therapy Evaluation Patient Details Name: Cody Palmer MRN: [id number] DOB: 1932/07/18 Today's Date: 05/02/2024  History of Present Illness  He was hospitalized 11/26 - 04/24/2024 with AKI.   He was discharged but returned after 4 days due to poor oral intake, anorexia, and decreased fluid intake.  This is associated with progressive weakness.  This constellation was associated with loose, diarrheal stools.  AKI was documented & IV fluid hydration initiated.SABRA  He did exhibit a peak creatinine of 3.06 with a nadir eGFR of 19 indicating CKD stage IV.  Final creatinine was 1.27 and GFR 54 indicating CKD stage IIIa.  Potassium ranged from a nadir of 3.2 up to a high of 5.3; final value was 3.3.  Protein/caloric malnutrition was present with an albumin  of 3.0 and total protein 4.6.  Transaminitis and elevation of LDH were present.  Final AST was 47; peak was 148. Final ALT was normal at 40; peak value had been 87.  Final LDH was 996 with a peak value of 1263.  Normochromic, normocytic anemia was present with H/H of 9/29.6; nadir values had been 7.6/24.6.  Glucoses while hospitalized ranged from 78 up to 224; both were outliers. Uric acid peak was 20.4 on 12/3; with initiation of Allopurinol  UA was normal @ 7.4. His A1c was 6.6% on 11/20 indicating excellent control. (Note: in the context of AKI some discordance might be expected.)  Oncologic consultation was pursued for for possible lymphoma associated ascites. 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  Paracentesis resulted in the production of 2.2 L; this was repeated 12/4 with removal of 4 L.  Cultures demonstrated gram-negative rods in both aerobic and anaerobic cultures.  Renal dose adjusted ciprofloxacin  was initiated.  This was transitioned to 5 additional days of oral therapy @ discharge.  Albumin  was ordered.    Oncology prescribed rasburicase  and prednisone  60 mg twice a day was initiated.  Cautious diuresis was pursued.   Focus was on limiting nephrotoxic drug exposure and adequate hydration.  Oncology recommended Megace  supplementation.  Rituximab was to be initiated after discharge.  Because of soft blood pressures only labetalol  at a reduced dose of 100 mg twice a day was continued; the remainder of the antihypertensive agents were held.  His generalized weakness was attributed to metabolic derangements.    Past medical and surgical history: Includes history of skin cancer; essential hypertension; dyslipidemia; diabetes with CKD; degenerative joint disease; GERD; & adult failure to thrive.  Surgeries and procedures include colonoscopy; cystoscopy; and bilateral TKA.  Clinical Impression  Pt very fatigued, initially agreeable to therapy but needs to take breaks between exercises and ultimately requests that we stop for today.          If plan is discharge home, recommend the following: A little help with walking and/or transfers;A lot of help with walking and/or transfers;A little help with bathing/dressing/bathroom;Help with stairs or ramp for entrance;Assistance with cooking/housework;Assist for transportation   Can travel by private vehicle   Yes    Equipment Recommendations None recommended by PT  Recommendations for Other Services       Functional Status Assessment Patient has had a recent decline in their functional status and demonstrates the ability to make significant improvements in function in a reasonable and predictable amount of time.     Precautions / Restrictions Precautions Precautions: Fall Recall of Precautions/Restrictions: Intact Restrictions Weight Bearing Restrictions Per Provider Order: No      Mobility  Bed Mobility  General bed mobility comments: Pt requests not to get out of bed following exercises states that he is too fatigued.    Transfers  Unknown at this time.                                  Pertinent Vitals/Pain Pain  Assessment Pain Assessment: No/denies pain    Home Living Family/patient expects to be discharged to:: Skilled nursing facility Living Arrangements: Alone Available Help at Discharge: Family;Available PRN/intermittently Type of Home: House Home Access: Stairs to enter Entrance Stairs-Rails: Right;Left;Can reach both Entrance Stairs-Number of Steps: 3   Home Layout: One level Home Equipment: Agricultural Consultant (2 wheels);Cane - single point;Grab bars - tub/shower      Prior Function Prior Level of Function : Independent/Modified Independent;Driving             Mobility Comments: Community ambulation using RW PRN, drives ADLs Comments: Independent, playing golf, driving     Extremity/Trunk Assessment        Lower Extremity Assessment Lower Extremity Assessment: Generalized weakness    Cervical / Trunk Assessment Cervical / Trunk Assessment: Normal  Communication   Communication Communication: No apparent difficulties    Cognition Arousal: Alert Behavior During Therapy: WFL for tasks assessed/performed   PT - Cognitive impairments: No apparent impairments                         Following commands: Intact       Cueing Cueing Techniques: Verbal cues     General Comments      Exercises General Exercises - Lower Extremity Ankle Circles/Pumps: AROM, 10 reps, Both, Supine Heel Slides: Both, 10 reps, Supine Hip ABduction/ADduction: Both, 5 reps, Supine Straight Leg Raises: 10 reps, Right Mini-Sqauts: 5 reps (bridges)   Assessment/Plan    PT Assessment Patient needs continued PT services  PT Problem List Decreased strength;Decreased activity tolerance;Decreased balance;Decreased mobility       PT Treatment Interventions DME instruction;Gait training;Stair training;Functional mobility training;Therapeutic activities;Therapeutic exercise;Balance training;Patient/family education    PT Goals (Current goals can be found in the Care Plan section)   Acute Rehab PT Goals Patient Stated Goal: return home with family to assist PT Goal Formulation: With patient Time For Goal Achievement: 05/23/24 Potential to Achieve Goals: Good    Frequency Min 3X/week        AM-PAC PT 6 Clicks Mobility  Outcome Measure Help needed turning from your back to your side while in a flat bed without using bedrails?: A Little Help needed moving from lying on your back to sitting on the side of a flat bed without using bedrails?: A Lot Help needed moving to and from a bed to a chair (including a wheelchair)?: A Lot Help needed standing up from a chair using your arms (e.g., wheelchair or bedside chair)?: A Lot Help needed to walk in hospital room?: A Lot Help needed climbing 3-5 steps with a railing? : A Lot 6 Click Score: 13    End of Session   Activity Tolerance: Patient limited by fatigue Patient left: in bed;with call bell/phone within reach;with family/visitor present   PT Visit Diagnosis: Other abnormalities of gait and mobility (R26.89);Difficulty in walking, not elsewhere classified (R26.2);Muscle weakness (generalized) (M62.81)    Time: 8394-8386 PT Time Calculation (min) (ACUTE ONLY): 8 min   Charges:   PT Evaluation $PT Eval Low Complexity: 1 Low   PT General  Charges $$ ACUTE PT VISIT: 1 Visit       Montie Metro, PT CLT [phone]  05/02/2024, 4:08 PM

## 2024-05-03 ENCOUNTER — Ambulatory Visit: Payer: Self-pay | Admitting: Oncology

## 2024-05-03 ENCOUNTER — Inpatient Hospital Stay (HOSPITAL_COMMUNITY)

## 2024-05-03 DIAGNOSIS — I10 Essential (primary) hypertension: Secondary | ICD-10-CM

## 2024-05-03 DIAGNOSIS — N1831 Chronic kidney disease, stage 3a: Secondary | ICD-10-CM | POA: Diagnosis not present

## 2024-05-03 DIAGNOSIS — K219 Gastro-esophageal reflux disease without esophagitis: Secondary | ICD-10-CM | POA: Diagnosis not present

## 2024-05-03 DIAGNOSIS — C8299 Follicular lymphoma, unspecified, extranodal and solid organ sites: Secondary | ICD-10-CM | POA: Diagnosis not present

## 2024-05-03 DIAGNOSIS — C8590 Non-Hodgkin lymphoma, unspecified, unspecified site: Secondary | ICD-10-CM | POA: Diagnosis not present

## 2024-05-03 LAB — COMPREHENSIVE METABOLIC PANEL WITH GFR
ALT: 283 U/L — ABNORMAL HIGH (ref 0–44)
ALT: 268 U/L — ABNORMAL HIGH (ref 0–44)
AST: 368 U/L — ABNORMAL HIGH (ref 15–41)
AST: 316 U/L — ABNORMAL HIGH (ref 15–41)
Albumin: 2.5 g/dL — ABNORMAL LOW (ref 3.5–5.0)
Albumin: 2.5 g/dL — ABNORMAL LOW (ref 3.5–5.0)
Alkaline Phosphatase: 1570 U/L — ABNORMAL HIGH (ref 38–126)
Alkaline Phosphatase: 1652 U/L — ABNORMAL HIGH (ref 38–126)
Anion gap: 13 (ref 5–15)
Anion gap: 16 — ABNORMAL HIGH (ref 5–15)
BUN: 34 mg/dL — ABNORMAL HIGH (ref 8–23)
BUN: 39 mg/dL — ABNORMAL HIGH (ref 8–23)
CO2: 28 mmol/L (ref 22–32)
CO2: 24 mmol/L (ref 22–32)
Calcium: 7.7 mg/dL — ABNORMAL LOW (ref 8.9–10.3)
Calcium: 7.5 mg/dL — ABNORMAL LOW (ref 8.9–10.3)
Chloride: 102 mmol/L (ref 98–111)
Chloride: 101 mmol/L (ref 98–111)
Creatinine, Ser: 1.42 mg/dL — ABNORMAL HIGH (ref 0.61–1.24)
Creatinine, Ser: 1.46 mg/dL — ABNORMAL HIGH (ref 0.61–1.24)
GFR, Estimated: 47 mL/min — ABNORMAL LOW
GFR, Estimated: 46 mL/min — ABNORMAL LOW
Glucose, Bld: 92 mg/dL (ref 70–99)
Glucose, Bld: 189 mg/dL — ABNORMAL HIGH (ref 70–99)
Potassium: 3.9 mmol/L (ref 3.5–5.1)
Potassium: 4.1 mmol/L (ref 3.5–5.1)
Sodium: 143 mmol/L (ref 135–145)
Sodium: 141 mmol/L (ref 135–145)
Total Bilirubin: 4.5 mg/dL — ABNORMAL HIGH (ref 0.0–1.2)
Total Bilirubin: 2.3 mg/dL — ABNORMAL HIGH (ref 0.0–1.2)
Total Protein: 4.3 g/dL — ABNORMAL LOW (ref 6.5–8.1)
Total Protein: 4.4 g/dL — ABNORMAL LOW (ref 6.5–8.1)

## 2024-05-03 LAB — CBC
HCT: 32.1 % — ABNORMAL LOW (ref 39.0–52.0)
HCT: 32.3 % — ABNORMAL LOW (ref 39.0–52.0)
Hemoglobin: 10.1 g/dL — ABNORMAL LOW (ref 13.0–17.0)
Hemoglobin: 10.1 g/dL — ABNORMAL LOW (ref 13.0–17.0)
MCH: 27.7 pg (ref 26.0–34.0)
MCH: 27.6 pg (ref 26.0–34.0)
MCHC: 31.5 g/dL (ref 30.0–36.0)
MCHC: 31.3 g/dL (ref 30.0–36.0)
MCV: 87.9 fL (ref 80.0–100.0)
MCV: 88.3 fL (ref 80.0–100.0)
Platelets: 234 K/uL (ref 150–400)
Platelets: 219 K/uL (ref 150–400)
RBC: 3.65 MIL/uL — ABNORMAL LOW (ref 4.22–5.81)
RBC: 3.66 MIL/uL — ABNORMAL LOW (ref 4.22–5.81)
RDW: 20 % — ABNORMAL HIGH (ref 11.5–15.5)
RDW: 20.3 % — ABNORMAL HIGH (ref 11.5–15.5)
WBC: 7.7 K/uL (ref 4.0–10.5)
WBC: 9.2 K/uL (ref 4.0–10.5)
nRBC: 0 % (ref 0.0–0.2)
nRBC: 0 % (ref 0.0–0.2)

## 2024-05-03 LAB — AMMONIA: Ammonia: 29 umol/L (ref 9–35)

## 2024-05-03 LAB — PHOSPHORUS
Phosphorus: 3.8 mg/dL (ref 2.5–4.6)
Phosphorus: 4.8 mg/dL — ABNORMAL HIGH (ref 2.5–4.6)

## 2024-05-03 LAB — LACTATE DEHYDROGENASE
LDH: 1377 U/L — ABNORMAL HIGH (ref 105–235)
LDH: 1633 U/L — ABNORMAL HIGH (ref 105–235)

## 2024-05-03 LAB — URIC ACID
Uric Acid, Serum: 8 mg/dL (ref 3.7–8.6)
Uric Acid, Serum: 7.9 mg/dL (ref 3.7–8.6)

## 2024-05-03 MED ORDER — EPINEPHRINE 0.3 MG/0.3ML IJ SOAJ: 0.3000 mg | Freq: Once | INTRAMUSCULAR | Status: DC | PRN

## 2024-05-03 MED ORDER — ALBUTEROL SULFATE HFA 108 (90 BASE) MCG/ACT IN AERS: 2.0000 | INHALATION_SPRAY | Freq: Once | RESPIRATORY_TRACT | Status: DC | PRN

## 2024-05-03 MED ORDER — FAMOTIDINE IN NACL 20-0.9 MG/50ML-% IV SOLN
20.0000 mg | Freq: Once | INTRAVENOUS | Status: AC | PRN
  Administered 2024-05-03: 20 mg via INTRAVENOUS

## 2024-05-03 MED ORDER — FAMOTIDINE IN NACL 20-0.9 MG/50ML-% IV SOLN
20.0000 mg | Freq: Once | INTRAVENOUS | Status: AC
  Administered 2024-05-03: 20 mg via INTRAVENOUS
  Filled 2024-05-03: qty 50

## 2024-05-03 MED ORDER — SODIUM CHLORIDE 0.9 % IV SOLN: Freq: Once | INTRAVENOUS | Status: DC | PRN

## 2024-05-03 MED ORDER — SODIUM CHLORIDE 0.9% FLUSH: 10.0000 mL | INTRAVENOUS | Status: DC | PRN

## 2024-05-03 MED ORDER — DIPHENHYDRAMINE HCL 50 MG/ML IJ SOLN
25.0000 mg | Freq: Once | INTRAMUSCULAR | Status: AC
  Administered 2024-05-03: 25 mg via INTRAVENOUS
  Filled 2024-05-03: qty 1

## 2024-05-03 MED ORDER — ACETAMINOPHEN 325 MG PO TABS: 650.0000 mg | ORAL_TABLET | Freq: Once | ORAL | Status: DC

## 2024-05-03 MED ORDER — METHYLPREDNISOLONE SODIUM SUCC 125 MG IJ SOLR
125.0000 mg | Freq: Once | INTRAMUSCULAR | Status: AC | PRN
  Administered 2024-05-03: 125 mg via INTRAVENOUS

## 2024-05-03 MED ORDER — MEPERIDINE HCL 25 MG/ML IJ SOLN: 12.5000 mg | Freq: Once | INTRAMUSCULAR | Status: DC

## 2024-05-03 MED ORDER — DIPHENHYDRAMINE HCL 50 MG/ML IJ SOLN
50.0000 mg | Freq: Once | INTRAMUSCULAR | Status: AC | PRN
  Administered 2024-05-03: 25 mg via INTRAVENOUS

## 2024-05-03 MED ORDER — SODIUM CHLORIDE 0.9% FLUSH: 3.0000 mL | INTRAVENOUS | Status: DC | PRN

## 2024-05-03 MED ORDER — SODIUM CHLORIDE 0.9 % IV SOLN
375.0000 mg/m2 | Freq: Once | INTRAVENOUS | Status: AC
  Administered 2024-05-03: 800 mg via INTRAVENOUS
  Filled 2024-05-03: qty 50

## 2024-05-03 MED ORDER — SODIUM CHLORIDE 0.9 % IV SOLN: INTRAVENOUS | Status: DC

## 2024-05-03 MED ORDER — ACETAMINOPHEN 10 MG/ML IV SOLN
1000.0000 mg | Freq: Once | INTRAVENOUS | Status: AC
  Administered 2024-05-03: 1000 mg via INTRAVENOUS
  Filled 2024-05-03: qty 100

## 2024-05-03 MED ORDER — HEPARIN SOD (PORK) LOCK FLUSH 100 UNIT/ML IV SOLN: 500.0000 [IU] | Freq: Once | INTRAVENOUS | Status: DC | PRN

## 2024-05-03 MED ORDER — ALTEPLASE 2 MG IJ SOLR: 2.0000 mg | Freq: Once | INTRAMUSCULAR | Status: DC | PRN

## 2024-05-03 MED ORDER — HEPARIN SOD (PORK) LOCK FLUSH 100 UNIT/ML IV SOLN: 250.0000 [IU] | Freq: Once | INTRAVENOUS | Status: DC | PRN

## 2024-05-03 NOTE — Progress Notes (Signed)
 " PROGRESS NOTE    Cody Palmer  FMW:[id number] DOB: Jun 23, 1932 DOA: 05/02/2024 PCP: [name], NP   Brief Narrative:  As per H&P written by Dr. Sherlon on 05/02/2024 Cody Palmer  is a 89 y.o. male, with stage IIIa CKD, hypertension, hyperlipidemia, type 2 diabetes mellitus, controlled, with recent hospitalization due to AKI, workup significant for non-Hodgkin's lymphoma. - Patient started on antibiotic during recent hospitalization for SBP, and recurrent ascites, fluid biopsy was significant for none Hodgkin's lymphoma, he had paracentesis yesterday with 4 L drained, follow-up with the department this morning, where they recommend initiation of  immunotherapy given he is high risk for tumor lysis syndrome admission has been recommended. - Labs and workup at oncology clinic today was elevated liver enzymes, creatinine at baseline, potassium low at 3.3, he had paracentesis yesterday done with 4 L drained, does not appear there were any labs sent.  Have discussed with oncology, who will initiate rituximab immunotherapy tomorrow at 8:30 a.m., premedications and rituximab although will be taking care of by oncology .  Assessment & Plan: 1-non-Hodgkin lymphoma - Patient has been started on reducing IV infusion - High risk for TLS - Continue oncology service recommendation - Maintain adequate hydration - Follow CMP, CBC, uric acid, phosphorus and LDH - Starting steroids - Continue to follow response and provide supportive care.  2-transaminitis/recurring ascites and hyperbilirubinemia - Continue to follow LFTs trend - Most likely associated with obstruction/sepsis due to ongoing lymphoma - Status post paracentesis on 05/01/2024 - Empirically started on ceftriaxone  with concern for SBP - Minimize hepatotoxic agents - Maintain adequate hydration - Follow clinical response.  3-chronic kidney disease stage IIIb - Appears to be stable and currently at baseline - Continue  to follow renal function trend.  4-essential hypertension - Blood pressure stable - Follow-up vital sign - Holding antihypertensive agents to minimize the chance of hypotension.  5-type 2 diabetes - Sliding scale insulin  has been started - Hold oral hypoglycemic agent - Follow CBG fluctuation.  6-hypokalemia - Replete electrolytes and follow trend - Continue telemetry monitoring.   DVT prophylaxis: Heparin  Code Status: DNR/DNI. Family Communication: No family at bedside. Disposition:   Status is: Inpatient Remains inpatient appropriate because: Continue IV therapy.   Consultants:  Hematology oncology  Procedures:  See below for x-ray reports.  Antimicrobials:  Rocephin    Subjective: Afebrile, no chest pain, no nausea vomiting.  Patient with a slight hypersensitivity reaction during rituximab infusion.  Patient received Pepcid and Solu-Medrol.  Objective: Vitals:   05/03/24 1347 05/03/24 1419 05/03/24 1448 05/03/24 1509  BP: 109/64 (!) 117/58 120/65 120/70  Pulse: (!) 112 (!) 110 (!) 111 (!) 113  Resp: 18  20 20   Temp:      TempSrc:      SpO2: 91% 93% 93% 94%    Intake/Output Summary (Last 24 hours) at 05/03/2024 1806 Last data filed at 05/03/2024 1800 Gross per 24 hour  Intake 1165.09 ml  Output 1100 ml  Net 65.09 ml   There were no vitals filed for this visit.  Examination:  General exam: Afebrile, no chest pain, no nausea, no vomiting.  Mild reaction when initiating rituximab reported. Respiratory system: Mild tachypnea; no wheezing, no crackles on exam.  No using accessory muscles. Cardiovascular system: Rate controlled, no rubs, no gallops, no JVD. Gastrointestinal system: Abdomen is mildly distended; positive fluid wave on exam secondary to ascites; no guarding, positive bowel sounds. Central nervous system: No focal neurological deficits. Extremities: Trace to 1+ edema appreciated  bilaterally; no cyanosis or clubbing. Skin: No  petechiae. Psychiatry: Judgement and insight appear normal.  Flat affect appreciated on exam.    Data Reviewed: I have personally reviewed following labs and imaging studies  CBC: Recent Labs  Lab 05/02/24 0430 05/02/24 1349 05/03/24 0458 05/03/24 1706  WBC 8.2 9.3 7.7 9.2  NEUTROABS  --  5.8  --   --   HGB 9.6* 10.7* 10.1* 10.1*  HCT 30.4* 33.4* 32.1* 32.3*  MCV 87.9 86.8 87.9 88.3  PLT 236 261 234 219    Basic Metabolic Panel: Recent Labs  Lab 05/02/24 0430 05/02/24 1349 05/02/24 1630 05/03/24 0458  NA 143  --  142 143  K 3.3*  --  3.4* 3.9  CL 102  --  101 102  CO2 34*  --  26 28  GLUCOSE 95  --  105* 92  BUN 35*  --  35* 34*  CREATININE 1.34*  --  1.37* 1.42*  CALCIUM  7.6*  --  7.7* 7.7*  MG  --  2.0  --   --   PHOS  --  3.8  --  3.8    GFR: Estimated Creatinine Clearance: 39.5 mL/min (A) (by C-G formula based on SCr of 1.42 mg/dL (H)).  Liver Function Tests: Recent Labs  Lab 05/02/24 0430 05/02/24 1630 05/03/24 0458  AST 453* 420* 368*  ALT 297* 308* 283*  ALKPHOS 1,402* 1,578* 1,570*  BILITOT 3.3* 4.2* 4.5*  PROT 4.2* 4.6* 4.3*  ALBUMIN  2.4* 2.6* 2.5*    CBG: No results for input(s): GLUCAP in the last 168 hours.   Recent Results (from the past 240 hours)  MRSA Next Gen by PCR, Nasal     Status: None   Collection Time: 05/02/24  3:30 PM   Specimen: Nasal Mucosa; Nasal Swab  Result Value Ref Range Status   MRSA by PCR Next Gen NOT DETECTED NOT DETECTED Final    Comment: (NOTE) The GeneXpert MRSA Assay (FDA approved for NASAL specimens only), is one component of a comprehensive MRSA colonization surveillance program. It is not intended to diagnose MRSA infection nor to guide or monitor treatment for MRSA infections. Test performance is not FDA approved in patients less than 26 years old. Performed at [facility], [address] [place], [place] [address]      Radiology Studies: US  Abdomen Limited RUQ (LIVER/GB) Result Date:  05/03/2024 EXAM: Right Upper Quadrant Abdominal Ultrasound 05/03/2024 08:42:15 AM TECHNIQUE: Real-time ultrasonography of the right upper quadrant of the abdomen was performed. COMPARISON: US  Abdomen 04/11/2024 and PET CT fusion study dated 05/02/2024. CLINICAL HISTORY: Transaminitis. FINDINGS: LIVER: Normal echogenicity. There is an ill-defined mixed echogenic mass present within the porta hepatitis measuring approximately 9.7 x 9.7 x 5.7 cm, corresponding with the metabolically avid mass noted on the previous PET CT. There is a cyst within the right hepatic lobe measuring approximately 12 mm in diameter. No intrahepatic biliary ductal dilatation. Hepatopetal flow in the portal vein. BILIARY SYSTEM: Gallbladder wall thickness measures 5.9 mm. There is gallbladder wall edema. There is sludge within the gallbladder. The common bile duct measures 3.0 mm. OTHER: There is mild ascites. IMPRESSION: 1. Ill-defined mixed echogenic mass within the porta hepatis measuring approximately 9.7 x 9.7 x 5.7 cm, corresponding with the metabolically avid mass noted on the previous PET CT. 2. Gallbladder sludge with wall edema/thickening (up to 5.9 mm). No focal tenderness over the gallbladder. Common bile duct measures 3 mm. 3. 12 mm right hepatic lobe cyst. 4. Mild  ascites. Electronically signed by: Evalene Coho MD 05/03/2024 09:25 AM EST RP Workstation: [id number]   NM PET Image Initial (PI) Skull Base To Thigh Result Date: 05/03/2024 EXAM: PET AND CT SKULL BASE TO MID THIGH 05/02/2024 02:09:18 PM TECHNIQUE: RADIOPHARMACEUTICAL: 10.6 mCi F-18 FDG Uptake time 60 minutes. Glucose level 125 mg/dl. PET imaging was acquired from the base of the skull to the mid thighs. Non-contrast enhanced computed tomography was obtained for attenuation correction and anatomic localization. COMPARISON: CT chest, abdomen and pelvis from 04/05/2024 and 04/04/2024. CLINICAL HISTORY: Abdominal Mass. FINDINGS: HEAD AND NECK: Right orbit lobe  prosthesis. Multiple tracer avid left cervical lymph nodes. Index node measures 6 mm with SUV max of 3.4. Tracer avid left supraclavicular lymph node measures 1.2 cm and has an SUV max of 7.0. CHEST: Tracer avid right paratracheal lymph node measures 7 mm with SUV max of 3.4, image 48. Within the posteromediastinum in the lower chest, there are multiple tracer-avid mediastinal lymph nodes. Along the left side of the distal descending thoracic aorta, a lymph node measures 1.1 cm with an SUV max of 11.0, image 75. Posterior to the left atrium, there is a lymph node which measures 1.3 cm with an SUV max of 7.6, axial image 64. Small bilateral pleural effusions identified. No tracer avid pulmonary nodule or mass identified. Aortic atherosclerosis. Coronary artery calcifications. Amorphous infiltrative soft tissue with Increased tracer uptake noted within the right and left cardiophrenic fat noted. Within the right cardiophrenic fat pad lymph node measures 1.7 cm with SUV max of 4.2, axial image 70. ABDOMEN AND PELVIS: Extensive tracer avid tumor is identified within the abdomen and pelvis with tracer-avid tumor involving the mesentery and retroperitoneum, and peritoneum. This includes: -soft tissue mass within the central mesentery measures 15.9 x 7.3 cm with SUV max of 13.6, axial image 102. -Infiltrative tumor within the ventral abdomen measures 10.0 x 4.3 cm with SUV max of 12.2, axial image 112. -Left retroperitoneal lymph node measures 2.2 cm with SUV max of 15.3, axial image 104. Right retroperitoneal tumor extends into the right perinephric space with SUV max measuring 11.9, axial image 108. -Infiltrative mass within the porta hepatic region extends around the gallbladder and along the falciform ligament with possible invasion into the umbilical vein. This measures 11.0 x 8.5 cm with SUV max of 12.9, image 83. -lymph node within the left inguinal region measures 1.1 cm with an SUV max of 6.0. - small to moderate  volume abdominal pelvic ascites. Normal size spleen. No focal areas of increased uptake identified within the splenic parenchyma. The sigmoid colon demonstrates diverticulosis without evidence of diverticulitis. No bowel wall thickening, pericolonic stranding, abscess, or free air is noted. Physiologic activity within the gastrointestinal and genitourinary systems. BONES AND SOFT TISSUE: Multifocal tracer avid bone lesions are identified. The index lesion within the left iliac bone measures 1.7 cm with an SUV max of 12.2, axial image 125. Index lesion within the proximal left humerus measures 1.4 cm with SUV max of 6.7, axial image 43. The index lesion within the right side of the T3 vertebral body measures 1.3 cm with SUV max of 7.8. IMPRESSION: 1. Extensive tracer-avid tumor is identified within the neck, chest, abdomen, and pelvis. Imaging findings are compatible with biopsy proven lymphoma. 2. Tumor within the abdomen and pelvis involves the peritoneal, mesenteric, and retroperitoneal spaces with bulky mesenteric adenopathy. Tumor within the porta hepatic region invades the falciform ligament with invasion of the umbilical vein. 3. Multifocal tracer-avid bone lesions compatible  with lymphomatous involvement. Electronically signed by: Waddell Calk MD 05/03/2024 06:11 AM EST RP Workstation: [id number]    Scheduled Meds:  allopurinol   150 mg Oral Daily   Chlorhexidine  Gluconate Cloth  6 each Topical Q0600   heparin   5,000 Units Subcutaneous Q8H   pantoprazole   40 mg Oral Daily   Continuous Infusions:  sodium chloride  Stopped (05/03/24 1507)   sodium chloride  Stopped (05/03/24 1105)   cefTRIAXone  (ROCEPHIN )  IV 2 g (05/02/24 1725)     LOS: 1 day    Time spent: 50 minutes    Eric Nunnery, MD [facility]   To contact the attending provider between 7A-7P or the covering provider during after hours 7P-7A, please log into the web site [email or web address] and access using universal [place]  password for that web site. If you do not have the password, please call the hospital operator.  05/03/2024, 6:06 PM    "

## 2024-05-03 NOTE — Plan of Care (Signed)
  Problem: Education: Goal: Knowledge of General Education information will improve Description: Including pain rating scale, medication(s)/side effects and non-pharmacologic comfort measures Outcome: Progressing   Problem: Clinical Measurements: Goal: Diagnostic test results will improve Outcome: Progressing   Problem: Activity: Goal: Risk for activity intolerance will decrease Outcome: Progressing   Problem: Nutrition: Goal: Adequate nutrition will be maintained Outcome: Progressing   Problem: Pain Managment: Goal: General experience of comfort will improve and/or be controlled Outcome: Progressing

## 2024-05-03 NOTE — TOC Progression Note (Signed)
 Transition of Care [facility]) - Progression Note    Patient Details  Name: Cody Palmer MRN: [id number] Date of Birth: May 30, 1932  [facility] of [facility]) CM/SW Contact  Sharlyne Stabs, RN Phone Number: 05/03/2024, 9:47 AM  Clinical Narrative:   PT is recommending SNF. Patient is from [facility]. They will need insurance authorization for him to return.  IPCM following to start Auth when stable to set a discharge date.     Expected Discharge Plan: Skilled Nursing Facility Barriers to Discharge: Continued Medical Work up    Expected Discharge Plan and Services In-house Referral: Clinical Social Work Discharge Planning Services: CM Consult   Living arrangements for the past 2 months: Single Family Home                                       Social Drivers of Health (SDOH) Interventions SDOH Screenings   Food Insecurity: No Food Insecurity (05/02/2024)  Housing: Patient Declined (05/02/2024)  Transportation Needs: Patient Declined (05/02/2024)  Utilities: Patient Declined (05/02/2024)  Depression (PHQ2-9): Low Risk (05/02/2024)  Social Connections: Patient Declined (05/02/2024)  Recent Concern: Social Connections - Moderately Isolated (04/10/2024)  Tobacco Use: Medium Risk (05/02/2024)    Readmission Risk Interventions    04/19/2024    1:46 PM  Readmission Risk Prevention Plan  Transportation Screening Complete  PCP or Specialist Appt within 3-5 Days Not Complete  HRI or Home Care Consult Complete  Social Work Consult for Recovery Care Planning/Counseling Complete  Palliative Care Screening Not Applicable  Medication Review Oceanographer) Complete

## 2024-05-03 NOTE — Progress Notes (Signed)
 OT Cancellation Note  Patient Details Name: Cody Palmer MRN: [id number] DOB: 04-27-1933   Cancelled Treatment:    Reason Eval/Treat Not Completed: Patient at procedure or test/ unavailable. Pt about to start 5 hour infusion according to RN. Will attempt evaluation later as time permits.   Oddie Bottger OT, MOT   Jayson Person 05/03/2024, 10:06 AM

## 2024-05-03 NOTE — Consult Note (Signed)
 Hematology consult progress note  Patient is an 89 year old male with biopsy-proven follicular lymphoma admitted for inpatient rituximab infusion with the risk of TLS.  Patient was getting rituximab at the time of infusion and reported no complaints.  He had an episode of hypersensitivity reaction developed during rituximab infusion and received Pepcid and Solu-Medrol.  At the time of examination, he did not have any complaints.  He was tolerating the treatment well.  PET scan reviewed with the patient.  There is extensive tracer avid tumor that is identified within the neck chest, abdomen and pelvis.  Tumor within the abdomen and pelvis and also peritoneal, mesenteric and retroperitoneal spaces with bulky mesenteric lymphadenopathy.  There are also multiple tracer avid bone lesions.  Labs reviewed today: Creatinine: 1.42, calcium : 7.7, alkaline phosphatase: 1570, AST: 368, ALT: 283, bilirubin: 4.5.  LDH: 1377.  CBC: Hemoglobin: 10.1, hematocrit: 32.1, normal WBC and platelets.  Uric acid: 8  Plan: - Continue TLS labs twice daily. - Elevated liver enzymes likely secondary to porta hepatis involvement by the mass. - Start prednisone  100 mg daily - Will continue to monitor patient.  Can consider rebiopsy of one of the more hypermetabolic areas to rule out DLBCL. -Hematology will continue to follow.  Please reach out with any questions or concerns.  Mickiel Dry, MD Hematology/Oncology [facility] at [facility]

## 2024-05-03 NOTE — Plan of Care (Signed)
  Problem: Education: Goal: Knowledge of General Education information will improve Description: Including pain rating scale, medication(s)/side effects and non-pharmacologic comfort measures Outcome: Progressing   Problem: Clinical Measurements: Goal: Ability to maintain clinical measurements within normal limits will improve Outcome: Progressing   Problem: Activity: Goal: Risk for activity intolerance will decrease Outcome: Progressing   Problem: Elimination: Goal: Will not experience complications related to urinary retention Outcome: Progressing   Problem: Pain Managment: Goal: General experience of comfort will improve and/or be controlled Outcome: Progressing   Problem: Safety: Goal: Ability to remain free from injury will improve Outcome: Progressing   Problem: Skin Integrity: Goal: Risk for impaired skin integrity will decrease Outcome: Progressing

## 2024-05-03 NOTE — Progress Notes (Signed)
 Chemo RN presented to patient room. Patient verified using two separate identifiers. Role of Chemo RN explained. Consent for administration of rituximab  obtained and placed in patient's chart. Drug titrated up to 150mg /hr when patient developed hypersensitivity reaction involving cough, SHOB, elevated BP and throat tightness. Drug stopped, IVF hung, Dr Davonna notified. Emergency medications given. See MAR for administration of medications. After about 45 minutes, patient returned to baseline and infusion was able to be restarted, titrated, and completed without further incident.

## 2024-05-03 NOTE — Progress Notes (Signed)
 Patient will receive acetaminophen  1000 mg IVPB prior to rituximab  due to inability to swallow.  Niels Molt, PharmD

## 2024-05-03 NOTE — Care Management Important Message (Signed)
 Important Message  Patient Details  Name: Cody Palmer MRN: [id number] Date of Birth: December 11, 1932   Important Message Given:  Yes - Medicare IM     Aydee Mcnew L Jael Kostick 05/03/2024, 1:56 PM

## 2024-05-03 NOTE — Progress Notes (Signed)
 FYI

## 2024-05-04 DIAGNOSIS — I10 Essential (primary) hypertension: Secondary | ICD-10-CM | POA: Diagnosis not present

## 2024-05-04 DIAGNOSIS — C8590 Non-Hodgkin lymphoma, unspecified, unspecified site: Secondary | ICD-10-CM | POA: Diagnosis not present

## 2024-05-04 DIAGNOSIS — N1831 Chronic kidney disease, stage 3a: Secondary | ICD-10-CM | POA: Diagnosis not present

## 2024-05-04 DIAGNOSIS — C8299 Follicular lymphoma, unspecified, extranodal and solid organ sites: Secondary | ICD-10-CM | POA: Diagnosis not present

## 2024-05-04 LAB — CBC
HCT: 29.2 % — ABNORMAL LOW (ref 39.0–52.0)
HCT: 30 % — ABNORMAL LOW (ref 39.0–52.0)
Hemoglobin: 9.3 g/dL — ABNORMAL LOW (ref 13.0–17.0)
Hemoglobin: 9.4 g/dL — ABNORMAL LOW (ref 13.0–17.0)
MCH: 27.8 pg (ref 26.0–34.0)
MCH: 27.9 pg (ref 26.0–34.0)
MCHC: 31.8 g/dL (ref 30.0–36.0)
MCHC: 31.3 g/dL (ref 30.0–36.0)
MCV: 87.4 fL (ref 80.0–100.0)
MCV: 89 fL (ref 80.0–100.0)
Platelets: 208 K/uL (ref 150–400)
Platelets: 212 K/uL (ref 150–400)
RBC: 3.34 MIL/uL — ABNORMAL LOW (ref 4.22–5.81)
RBC: 3.37 MIL/uL — ABNORMAL LOW (ref 4.22–5.81)
RDW: 20.4 % — ABNORMAL HIGH (ref 11.5–15.5)
RDW: 20.5 % — ABNORMAL HIGH (ref 11.5–15.5)
WBC: 7.4 K/uL (ref 4.0–10.5)
WBC: 6.9 K/uL (ref 4.0–10.5)
nRBC: 0 % (ref 0.0–0.2)
nRBC: 0 % (ref 0.0–0.2)

## 2024-05-04 LAB — COMPREHENSIVE METABOLIC PANEL WITH GFR
ALT: 217 U/L — ABNORMAL HIGH (ref 0–44)
ALT: 205 U/L — ABNORMAL HIGH (ref 0–44)
AST: 178 U/L — ABNORMAL HIGH (ref 15–41)
AST: 215 U/L — ABNORMAL HIGH (ref 15–41)
Albumin: 2.5 g/dL — ABNORMAL LOW (ref 3.5–5.0)
Albumin: 2.6 g/dL — ABNORMAL LOW (ref 3.5–5.0)
Alkaline Phosphatase: 1365 U/L — ABNORMAL HIGH (ref 38–126)
Alkaline Phosphatase: 1612 U/L — ABNORMAL HIGH (ref 38–126)
Anion gap: 14 (ref 5–15)
Anion gap: 14 (ref 5–15)
BUN: 42 mg/dL — ABNORMAL HIGH (ref 8–23)
BUN: 42 mg/dL — ABNORMAL HIGH (ref 8–23)
CO2: 25 mmol/L (ref 22–32)
CO2: 25 mmol/L (ref 22–32)
Calcium: 7.4 mg/dL — ABNORMAL LOW (ref 8.9–10.3)
Calcium: 7.5 mg/dL — ABNORMAL LOW (ref 8.9–10.3)
Chloride: 103 mmol/L (ref 98–111)
Chloride: 104 mmol/L (ref 98–111)
Creatinine, Ser: 1.43 mg/dL — ABNORMAL HIGH (ref 0.61–1.24)
Creatinine, Ser: 1.31 mg/dL — ABNORMAL HIGH (ref 0.61–1.24)
GFR, Estimated: 47 mL/min — ABNORMAL LOW
GFR, Estimated: 52 mL/min — ABNORMAL LOW
Glucose, Bld: 143 mg/dL — ABNORMAL HIGH (ref 70–99)
Glucose, Bld: 134 mg/dL — ABNORMAL HIGH (ref 70–99)
Potassium: 3.9 mmol/L (ref 3.5–5.1)
Potassium: 3.8 mmol/L (ref 3.5–5.1)
Sodium: 143 mmol/L (ref 135–145)
Sodium: 143 mmol/L (ref 135–145)
Total Bilirubin: 1.2 mg/dL (ref 0.0–1.2)
Total Bilirubin: 1.9 mg/dL — ABNORMAL HIGH (ref 0.0–1.2)
Total Protein: 4.4 g/dL — ABNORMAL LOW (ref 6.5–8.1)
Total Protein: 4.5 g/dL — ABNORMAL LOW (ref 6.5–8.1)

## 2024-05-04 LAB — URIC ACID
Uric Acid, Serum: 8.5 mg/dL (ref 3.7–8.6)
Uric Acid, Serum: 8.1 mg/dL (ref 3.7–8.6)

## 2024-05-04 LAB — PHOSPHORUS
Phosphorus: 6.5 mg/dL — ABNORMAL HIGH (ref 2.5–4.6)
Phosphorus: 5.6 mg/dL — ABNORMAL HIGH (ref 2.5–4.6)

## 2024-05-04 LAB — LACTATE DEHYDROGENASE
LDH: 1400 U/L — ABNORMAL HIGH (ref 105–235)
LDH: 1335 U/L — ABNORMAL HIGH (ref 105–235)

## 2024-05-04 MED ORDER — MELATONIN 3 MG PO TABS
3.0000 mg | ORAL_TABLET | Freq: Every evening | ORAL | Status: DC | PRN
  Administered 2024-05-04 – 2024-05-12 (×5): 3 mg via ORAL
  Filled 2024-05-04 (×5): qty 1

## 2024-05-04 NOTE — Consult Note (Signed)
 Hematology consult progress note  Patient is an 89 year old male with biopsy-proven follicular lymphoma admitted for inpatient rituximab  infusion with the risk of TLS.  Patient received first dose rituximab  yesterday.  Appears to have tolerated well.  PET scan was reviewed yesterday with the patient.  Labs reviewed today: Creatinine: 1.43, calcium : 7.4, alkaline phosphatase: 1365, AST: 178, ALT: 217, bilirubin: 1.2.  LDH: 1400.  CBC: Hemoglobin: 9.3, hematocrit: 29.2, normal WBC and platelets.  Uric acid: 8.5.   Plan: - Continue TLS labs twice daily. - Elevated liver enzymes likely secondary to porta hepatis involvement by the mass. - Continue prednisone  100 mg daily - Will continue to monitor patient.  Can consider rebiopsy of one of the more hypermetabolic areas to rule out DLBCL. -Hematology will continue to follow.  Please reach out with any questions or concerns.  Delon Hope, AGNP-C Department of Hematology/Oncology [facility] at [facility]  Phone: [phone]  05/04/2024 1:28 PM

## 2024-05-04 NOTE — Progress Notes (Signed)
 " PROGRESS NOTE    Cody Palmer  FMW:[id number] DOB: Apr 03, 1933 DOA: 05/02/2024 PCP: [name], NP   Brief Narrative:  As per H&P written by Dr. Sherlon on 05/02/2024 Cody Palmer  is a 89 y.o. male, with stage IIIa CKD, hypertension, hyperlipidemia, type 2 diabetes mellitus, controlled, with recent hospitalization due to AKI, workup significant for non-Hodgkin's lymphoma. - Patient started on antibiotic during recent hospitalization for SBP, and recurrent ascites, fluid biopsy was significant for none Hodgkin's lymphoma, he had paracentesis yesterday with 4 L drained, follow-up with the department this morning, where they recommend initiation of  immunotherapy given he is high risk for tumor lysis syndrome admission has been recommended. - Labs and workup at oncology clinic today was elevated liver enzymes, creatinine at baseline, potassium low at 3.3, he had paracentesis yesterday done with 4 L drained, does not appear there were any labs sent.  Have discussed with oncology, who will initiate rituximab  immunotherapy tomorrow at 8:30 a.m., premedications and rituximab  although will be taking care of by oncology .  Assessment & Plan: 1-non-Hodgkin lymphoma - Patient has been started on reducing IV infusion - High risk for TLS - Continue oncology service recommendation - Maintain adequate hydration - Follow CMP, CBC, uric acid, phosphorus and LDH. - Continue the use of steroids - Continue to follow response and provide supportive care.  2-transaminitis/recurring ascites and hyperbilirubinemia - Continue to follow LFTs trend - Most likely associated with obstruction/hepatic stasis due to ongoing lymphoma -LFTs trending down. - Status post paracentesis on 05/01/2024 -Given ongoing abdominal distention and fluid wave appreciated will probably need repeat paracentesis on 05/06/2024 - Empirically started on ceftriaxone  with concern for SBP. - Continue to minimize  hepatotoxic agents - Maintain adequate hydration - Continue to follow clinical response.  3-chronic kidney disease stage IIIb - Appears to be stable and currently at baseline - Continue to follow renal function trend.  4-essential hypertension - Blood pressure stable - Follow-up vital sign - Holding antihypertensive agents to minimize the chance of hypotension.  5-type 2 diabetes - Sliding scale insulin  has been started - Hold oral hypoglycemic agent - Follow CBG fluctuation.  6-hypokalemia - Continue to follow ultralights and further replete as needed. - Continue telemetry monitoring.   DVT prophylaxis: Heparin  Code Status: DNR/DNI. Family Communication: No family at bedside. Disposition:   Status is: Inpatient Remains inpatient appropriate because: Continue IV therapy.   Consultants:  Hematology oncology  Procedures:  See below for x-ray reports.  Antimicrobials:  Rocephin    Subjective: No overnight events; reports feeling better.  Patient is afebrile, no chest pain, no nausea, no vomiting, good saturation on room air.  Objective: Vitals:   05/03/24 2130 05/04/24 0220 05/04/24 0523 05/04/24 1226  BP: 130/68 124/63 (!) 127/55 133/69  Pulse: 92 90 82 97  Resp: (!) 22 20 16 20   Temp: 98.2 F (36.8 C) (!) 97.5 F (36.4 C) (!) 97.4 F (36.3 C) 98.8 F (37.1 C)  TempSrc: Oral Oral Axillary Oral  SpO2: 92% 94% 92% 95%    Intake/Output Summary (Last 24 hours) at 05/04/2024 1647 Last data filed at 05/04/2024 1224 Gross per 24 hour  Intake 1381.21 ml  Output 1625 ml  Net -243.79 ml   There were no vitals filed for this visit.  Examination: General exam: Alert, awake, oriented x 3; reports feeling better and expressed no overnight events.  Good saturation on room air. Respiratory system: Decreased breath sounds at the bases; no using accessory muscles.  Good  saturation on room air. Cardiovascular system: Rate controlled, no rubs, no gallops, no  JVD. Gastrointestinal system: Abdomen is distended, soft, positive bowel sounds.  No guarding.  Positive fluid wave is appreciated on exam. Central nervous system: Multiweek.  No focal neurological deficits. Extremities: No diagnosis of clubbing; trace to 1+ edema bilaterally. Skin: No petechiae. Psychiatry: Judgement and insight appear normal. Mood & affect appropriate.   Data Reviewed: I have personally reviewed following labs and imaging studies  CBC: Recent Labs  Lab 05/02/24 0430 05/02/24 1349 05/03/24 0458 05/03/24 1706 05/04/24 0343  WBC 8.2 9.3 7.7 9.2 7.4  NEUTROABS  --  5.8  --   --   --   HGB 9.6* 10.7* 10.1* 10.1* 9.3*  HCT 30.4* 33.4* 32.1* 32.3* 29.2*  MCV 87.9 86.8 87.9 88.3 87.4  PLT 236 261 234 219 208    Basic Metabolic Panel: Recent Labs  Lab 05/02/24 0430 05/02/24 1349 05/02/24 1630 05/03/24 0458 05/03/24 1706 05/04/24 0343  NA 143  --  142 143 141 143  K 3.3*  --  3.4* 3.9 4.1 3.9  CL 102  --  101 102 101 103  CO2 34*  --  26 28 24 25   GLUCOSE 95  --  105* 92 189* 143*  BUN 35*  --  35* 34* 39* 42*  CREATININE 1.34*  --  1.37* 1.42* 1.46* 1.43*  CALCIUM  7.6*  --  7.7* 7.7* 7.5* 7.4*  MG  --  2.0  --   --   --   --   PHOS  --  3.8  --  3.8 4.8* 6.5*    GFR: Estimated Creatinine Clearance: 39.2 mL/min (A) (by C-G formula based on SCr of 1.43 mg/dL (H)).  Liver Function Tests: Recent Labs  Lab 05/02/24 0430 05/02/24 1630 05/03/24 0458 05/03/24 1706 05/04/24 0343  AST 453* 420* 368* 316* 178*  ALT 297* 308* 283* 268* 217*  ALKPHOS 1,402* 1,578* 1,570* 1,652* 1,365*  BILITOT 3.3* 4.2* 4.5* 2.3* 1.2  PROT 4.2* 4.6* 4.3* 4.4* 4.4*  ALBUMIN  2.4* 2.6* 2.5* 2.5* 2.5*    CBG: No results for input(s): GLUCAP in the last 168 hours.   Recent Results (from the past 240 hours)  MRSA Next Gen by PCR, Nasal     Status: None   Collection Time: 05/02/24  3:30 PM   Specimen: Nasal Mucosa; Nasal Swab  Result Value Ref Range Status   MRSA by  PCR Next Gen NOT DETECTED NOT DETECTED Final    Comment: (NOTE) The GeneXpert MRSA Assay (FDA approved for NASAL specimens only), is one component of a comprehensive MRSA colonization surveillance program. It is not intended to diagnose MRSA infection nor to guide or monitor treatment for MRSA infections. Test performance is not FDA approved in patients less than 41 years old. Performed at [facility], [address], [place], [place] [address]      Radiology Studies: US  Abdomen Limited RUQ (LIVER/GB) Result Date: 05/03/2024 EXAM: Right Upper Quadrant Abdominal Ultrasound 05/03/2024 08:42:15 AM TECHNIQUE: Real-time ultrasonography of the right upper quadrant of the abdomen was performed. COMPARISON: US  Abdomen 04/11/2024 and PET CT fusion study dated 05/02/2024. CLINICAL HISTORY: Transaminitis. FINDINGS: LIVER: Normal echogenicity. There is an ill-defined mixed echogenic mass present within the porta hepatitis measuring approximately 9.7 x 9.7 x 5.7 cm, corresponding with the metabolically avid mass noted on the previous PET CT. There is a cyst within the right hepatic lobe measuring approximately 12 mm in diameter. No intrahepatic biliary ductal  dilatation. Hepatopetal flow in the portal vein. BILIARY SYSTEM: Gallbladder wall thickness measures 5.9 mm. There is gallbladder wall edema. There is sludge within the gallbladder. The common bile duct measures 3.0 mm. OTHER: There is mild ascites. IMPRESSION: 1. Ill-defined mixed echogenic mass within the porta hepatis measuring approximately 9.7 x 9.7 x 5.7 cm, corresponding with the metabolically avid mass noted on the previous PET CT. 2. Gallbladder sludge with wall edema/thickening (up to 5.9 mm). No focal tenderness over the gallbladder. Common bile duct measures 3 mm. 3. 12 mm right hepatic lobe cyst. 4. Mild ascites. Electronically signed by: Evalene Coho MD 05/03/2024 09:25 AM EST RP Workstation: [id number]    Scheduled Meds:  allopurinol    150 mg Oral Daily   Chlorhexidine  Gluconate Cloth  6 each Topical Q0600   heparin   5,000 Units Subcutaneous Q8H   pantoprazole   40 mg Oral Daily   Continuous Infusions:  sodium chloride  Stopped (05/03/24 1507)   sodium chloride  Stopped (05/03/24 1105)   cefTRIAXone  (ROCEPHIN )  IV 2 g (05/04/24 1617)     LOS: 2 days    Time spent: 50 minutes    Eric Nunnery, MD [facility]   To contact the attending provider between 7A-7P or the covering provider during after hours 7P-7A, please log into the web site [email or web address] and access using universal [place] password for that web site. If you do not have the password, please call the hospital operator.  05/04/2024, 4:47 PM    "

## 2024-05-05 DIAGNOSIS — I10 Essential (primary) hypertension: Secondary | ICD-10-CM | POA: Diagnosis not present

## 2024-05-05 DIAGNOSIS — C8299 Follicular lymphoma, unspecified, extranodal and solid organ sites: Secondary | ICD-10-CM | POA: Diagnosis not present

## 2024-05-05 DIAGNOSIS — N1831 Chronic kidney disease, stage 3a: Secondary | ICD-10-CM | POA: Diagnosis not present

## 2024-05-05 DIAGNOSIS — C8590 Non-Hodgkin lymphoma, unspecified, unspecified site: Secondary | ICD-10-CM | POA: Diagnosis not present

## 2024-05-05 LAB — URIC ACID
Uric Acid, Serum: 8.3 mg/dL (ref 3.7–8.6)
Uric Acid, Serum: 7.5 mg/dL (ref 3.7–8.6)

## 2024-05-05 LAB — CBC
HCT: 28.7 % — ABNORMAL LOW (ref 39.0–52.0)
HCT: 32 % — ABNORMAL LOW (ref 39.0–52.0)
Hemoglobin: 8.9 g/dL — ABNORMAL LOW (ref 13.0–17.0)
Hemoglobin: 9.9 g/dL — ABNORMAL LOW (ref 13.0–17.0)
MCH: 27.2 pg (ref 26.0–34.0)
MCH: 27.6 pg (ref 26.0–34.0)
MCHC: 31 g/dL (ref 30.0–36.0)
MCHC: 30.9 g/dL (ref 30.0–36.0)
MCV: 87.8 fL (ref 80.0–100.0)
MCV: 89.1 fL (ref 80.0–100.0)
Platelets: 209 K/uL (ref 150–400)
Platelets: 221 K/uL (ref 150–400)
RBC: 3.27 MIL/uL — ABNORMAL LOW (ref 4.22–5.81)
RBC: 3.59 MIL/uL — ABNORMAL LOW (ref 4.22–5.81)
RDW: 20.6 % — ABNORMAL HIGH (ref 11.5–15.5)
RDW: 20.6 % — ABNORMAL HIGH (ref 11.5–15.5)
WBC: 5.7 K/uL (ref 4.0–10.5)
WBC: 5.4 K/uL (ref 4.0–10.5)
nRBC: 0 % (ref 0.0–0.2)
nRBC: 0 % (ref 0.0–0.2)

## 2024-05-05 LAB — COMPREHENSIVE METABOLIC PANEL WITH GFR
ALT: 183 U/L — ABNORMAL HIGH (ref 0–44)
ALT: 158 U/L — ABNORMAL HIGH (ref 0–44)
AST: 151 U/L — ABNORMAL HIGH (ref 15–41)
AST: 86 U/L — ABNORMAL HIGH (ref 15–41)
Albumin: 2.4 g/dL — ABNORMAL LOW (ref 3.5–5.0)
Albumin: 2.6 g/dL — ABNORMAL LOW (ref 3.5–5.0)
Alkaline Phosphatase: 1492 U/L — ABNORMAL HIGH (ref 38–126)
Alkaline Phosphatase: 1413 U/L — ABNORMAL HIGH (ref 38–126)
Anion gap: 13 (ref 5–15)
Anion gap: 14 (ref 5–15)
BUN: 38 mg/dL — ABNORMAL HIGH (ref 8–23)
BUN: 34 mg/dL — ABNORMAL HIGH (ref 8–23)
CO2: 25 mmol/L (ref 22–32)
CO2: 24 mmol/L (ref 22–32)
Calcium: 7.3 mg/dL — ABNORMAL LOW (ref 8.9–10.3)
Calcium: 7.5 mg/dL — ABNORMAL LOW (ref 8.9–10.3)
Chloride: 104 mmol/L (ref 98–111)
Chloride: 104 mmol/L (ref 98–111)
Creatinine, Ser: 1.26 mg/dL — ABNORMAL HIGH (ref 0.61–1.24)
Creatinine, Ser: 1.12 mg/dL (ref 0.61–1.24)
GFR, Estimated: 55 mL/min — ABNORMAL LOW
GFR, Estimated: >60 mL/min
Glucose, Bld: 107 mg/dL — ABNORMAL HIGH (ref 70–99)
Glucose, Bld: 138 mg/dL — ABNORMAL HIGH (ref 70–99)
Potassium: 3.7 mmol/L (ref 3.5–5.1)
Potassium: 3.8 mmol/L (ref 3.5–5.1)
Sodium: 142 mmol/L (ref 135–145)
Sodium: 142 mmol/L (ref 135–145)
Total Bilirubin: 1.2 mg/dL (ref 0.0–1.2)
Total Bilirubin: 0.9 mg/dL (ref 0.0–1.2)
Total Protein: 4.3 g/dL — ABNORMAL LOW (ref 6.5–8.1)
Total Protein: 4.7 g/dL — ABNORMAL LOW (ref 6.5–8.1)

## 2024-05-05 LAB — LACTATE DEHYDROGENASE
LDH: 1151 U/L — ABNORMAL HIGH (ref 105–235)
LDH: 1102 U/L — ABNORMAL HIGH (ref 105–235)

## 2024-05-05 LAB — PHOSPHORUS
Phosphorus: 4.8 mg/dL — ABNORMAL HIGH (ref 2.5–4.6)
Phosphorus: 4.5 mg/dL (ref 2.5–4.6)

## 2024-05-05 MED ORDER — FUROSEMIDE 10 MG/ML IJ SOLN
40.0000 mg | Freq: Once | INTRAMUSCULAR | Status: AC
  Administered 2024-05-05: 40 mg via INTRAVENOUS
  Filled 2024-05-05: qty 4

## 2024-05-05 MED ORDER — SODIUM CHLORIDE 0.9 % IV SOLN: INTRAVENOUS | Status: AC

## 2024-05-05 MED ORDER — INSULIN ASPART 100 UNIT/ML IJ SOLN
0.0000 [IU] | Freq: Every day | INTRAMUSCULAR | Status: DC
  Filled 2024-05-05: qty 1

## 2024-05-05 MED ORDER — INSULIN ASPART 100 UNIT/ML IJ SOLN
0.0000 [IU] | Freq: Three times a day (TID) | INTRAMUSCULAR | Status: DC
  Administered 2024-05-05 – 2024-05-08 (×5): 1 [IU] via SUBCUTANEOUS
  Administered 2024-05-08 – 2024-05-09 (×2): 2 [IU] via SUBCUTANEOUS
  Administered 2024-05-10 – 2024-05-11 (×3): 1 [IU] via SUBCUTANEOUS
  Administered 2024-05-11: 2 [IU] via SUBCUTANEOUS
  Administered 2024-05-12 (×2): 1 [IU] via SUBCUTANEOUS
  Filled 2024-05-05 (×13): qty 1

## 2024-05-05 NOTE — Plan of Care (Signed)
   Problem: Education: Goal: Knowledge of General Education information will improve Description: Including pain rating scale, medication(s)/side effects and non-pharmacologic comfort measures Outcome: Progressing   Problem: Clinical Measurements: Goal: Ability to maintain clinical measurements within normal limits will improve Outcome: Progressing Goal: Diagnostic test results will improve Outcome: Progressing

## 2024-05-05 NOTE — Plan of Care (Signed)

## 2024-05-05 NOTE — Progress Notes (Signed)
 " PROGRESS NOTE    Cody Palmer  FMW:[id number] DOB: 02-17-1933 DOA: 05/02/2024 PCP: [name], NP   Brief Narrative:  As per H&P written by Dr. Sherlon on 05/02/2024 Cody Palmer  is a 89 y.o. male, with stage IIIa CKD, hypertension, hyperlipidemia, type 2 diabetes mellitus, controlled, with recent hospitalization due to AKI, workup significant for non-Hodgkin's lymphoma. - Patient started on antibiotic during recent hospitalization for SBP, and recurrent ascites, fluid biopsy was significant for none Hodgkin's lymphoma, he had paracentesis yesterday with 4 L drained, follow-up with the department this morning, where they recommend initiation of  immunotherapy given he is high risk for tumor lysis syndrome admission has been recommended. - Labs and workup at oncology clinic today was elevated liver enzymes, creatinine at baseline, potassium low at 3.3, he had paracentesis yesterday done with 4 L drained, does not appear there were any labs sent.  Have discussed with oncology, who will initiate rituximab  immunotherapy tomorrow at 8:30 a.m., premedications and rituximab  although will be taking care of by oncology .  Assessment & Plan: 1-non-Hodgkin lymphoma - Patient has been started on reducing IV infusion - High risk for TLS - Continue to follow oncology service recommendation - Maintain adequate hydration - Follow CMP, CBC, uric acid, phosphorus and LDH. - Continue the use of steroids - Continue to follow response and provide supportive care. - Adequate IVF's hydration and Lasix  will be provided today.  2-transaminitis/recurring ascites and hyperbilirubinemia - Continue to follow LFTs trend - Most likely associated with obstruction/hepatic stasis due to ongoing lymphoma -LFTs trending down.  Continue to monitor levels intermittently. - Status post paracentesis on 05/01/2024 -Given ongoing abdominal distention and fluid wave appreciated will arrange for repeat  paracentesis on 05/06/2024 - Empirically started on ceftriaxone  with concern for SBP. - Continue to minimize hepatotoxic agents - Maintain adequate hydration - Continue to follow clinical response.  3-chronic kidney disease stage IIIb - Appears to be stable and currently at baseline - Continue to follow renal function trend. - Patient will receive fluid resuscitation along with Lasix  to maintain volume status.  4-essential hypertension - Blood pressure stable - Follow-up vital sign - Holding antihypertensive agents to minimize the chance of hypotension.  5-type 2 diabetes - Sliding scale insulin  has been started - Hold oral hypoglycemic agent - Follow CBG fluctuation.  6-hypokalemia - Continue to follow ultralights and further replete as needed. - Continue telemetry monitoring.   DVT prophylaxis: Heparin  Code Status: DNR/DNI. Family Communication: No family at bedside. Disposition:   Status is: Inpatient Remains inpatient appropriate because: Continue IV therapy.   Consultants:  Hematology oncology  Procedures:  See below for x-ray reports.  Antimicrobials:  Rocephin    Subjective: Good saturation on room air; no complaining of nausea or vomiting.  Patient reports no recent increase distention in his abdomen and also swelling in his extremities.  Objective: Vitals:   05/04/24 1226 05/04/24 2005 05/05/24 0410 05/05/24 1300  BP: 133/69 134/61 (!) 114/55 129/63  Pulse: 97 97 70 86  Resp: 20 20 18 16   Temp: 98.8 F (37.1 C) 98.7 F (37.1 C) 97.7 F (36.5 C) 98.2 F (36.8 C)  TempSrc: Oral Oral Oral Oral  SpO2: 95% 93% 95% 95%    Intake/Output Summary (Last 24 hours) at 05/05/2024 1511 Last data filed at 05/05/2024 1217 Gross per 24 hour  Intake 1080 ml  Output 2200 ml  Net -1120 ml   There were no vitals filed for this visit.  Examination: General exam:  Alert, awake, oriented x 3; no fever, no nausea, no vomiting.  Patient reports no chest  pain. Respiratory system: Decreased breath sounds at the bases; no wheezing, no using accessory muscles.  Mild tachypnea secondary to increased ascites. Cardiovascular system: Rate controlled, no rubs, no gallops, no JVD. Gastrointestinal system: Abdomen is distended, without guarding; positive fluid wave appreciated on exam. Central nervous system: No focal neurological deficits. Extremities: No cyanosis or clubbing; trace to 1+ edema appreciated bilaterally. Skin: No petechiae. Psychiatry: Judgement and insight appear normal. Mood & affect appropriate.   Data Reviewed: I have personally reviewed following labs and imaging studies  CBC: Recent Labs  Lab 05/02/24 1349 05/03/24 0458 05/03/24 1706 05/04/24 0343 05/04/24 1656 05/05/24 0333  WBC 9.3 7.7 9.2 7.4 6.9 5.7  NEUTROABS 5.8  --   --   --   --   --   HGB 10.7* 10.1* 10.1* 9.3* 9.4* 8.9*  HCT 33.4* 32.1* 32.3* 29.2* 30.0* 28.7*  MCV 86.8 87.9 88.3 87.4 89.0 87.8  PLT 261 234 219 208 212 209    Basic Metabolic Panel: Recent Labs  Lab 05/02/24 1349 05/02/24 1630 05/03/24 0458 05/03/24 1706 05/04/24 0343 05/04/24 1656 05/05/24 0333  NA  --    < > 143 141 143 143 142  K  --    < > 3.9 4.1 3.9 3.8 3.7  CL  --    < > 102 101 103 104 104  CO2  --    < > 28 24 25 25 25   GLUCOSE  --    < > 92 189* 143* 134* 107*  BUN  --    < > 34* 39* 42* 42* 38*  CREATININE  --    < > 1.42* 1.46* 1.43* 1.31* 1.26*  CALCIUM   --    < > 7.7* 7.5* 7.4* 7.5* 7.3*  MG 2.0  --   --   --   --   --   --   PHOS 3.8  --  3.8 4.8* 6.5* 5.6* 4.8*   < > = values in this interval not displayed.    GFR: Estimated Creatinine Clearance: 44.5 mL/min (A) (by C-G formula based on SCr of 1.26 mg/dL (H)).  Liver Function Tests: Recent Labs  Lab 05/03/24 0458 05/03/24 1706 05/04/24 0343 05/04/24 1656 05/05/24 0333  AST 368* 316* 178* 215* 151*  ALT 283* 268* 217* 205* 183*  ALKPHOS 1,570* 1,652* 1,365* 1,612* 1,492*  BILITOT 4.5* 2.3* 1.2 1.9*  1.2  PROT 4.3* 4.4* 4.4* 4.5* 4.3*  ALBUMIN  2.5* 2.5* 2.5* 2.6* 2.4*    CBG: No results for input(s): GLUCAP in the last 168 hours.   Recent Results (from the past 240 hours)  MRSA Next Gen by PCR, Nasal     Status: None   Collection Time: 05/02/24  3:30 PM   Specimen: Nasal Mucosa; Nasal Swab  Result Value Ref Range Status   MRSA by PCR Next Gen NOT DETECTED NOT DETECTED Final    Comment: (NOTE) The GeneXpert MRSA Assay (FDA approved for NASAL specimens only), is one component of a comprehensive MRSA colonization surveillance program. It is not intended to diagnose MRSA infection nor to guide or monitor treatment for MRSA infections. Test performance is not FDA approved in patients less than 76 years old. Performed at [facility], [address] [place], [place] [address]      Radiology Studies: No results found.   Scheduled Meds:  allopurinol   150 mg Oral Daily  Chlorhexidine  Gluconate Cloth  6 each Topical Q0600   heparin   5,000 Units Subcutaneous Q8H   pantoprazole   40 mg Oral Daily   Continuous Infusions:  sodium chloride  Stopped (05/03/24 1507)   sodium chloride  Stopped (05/03/24 1105)   sodium chloride  75 mL/hr at 05/05/24 1446   cefTRIAXone  (ROCEPHIN )  IV 2 g (05/04/24 1617)     LOS: 3 days    Time spent: 50 minutes    Eric Nunnery, MD [facility]   To contact the attending provider between 7A-7P or the covering provider during after hours 7P-7A, please log into the web site [email or web address] and access using universal [place] password for that web site. If you do not have the password, please call the hospital operator.  05/05/2024, 3:11 PM    "

## 2024-05-05 NOTE — Progress Notes (Addendum)
 I checked patient glucose with meter, did not load into chart.   Blood sugar was 144- according to sliding scale, gave 1 unit of insulin .  Harlene Pepper, NT was in the room during blood sugar check.

## 2024-05-05 NOTE — Consult Note (Signed)
 Hematology consult progress note  Patient is an 89 year old male with biopsy-proven follicular lymphoma admitted for inpatient rituximab  infusion with the risk of TLS.  Patient received first dose rituximab  on 05/03/24.  Appears to have tolerated well.  PET scan was reviewed with the patient by MD.   Labs reviewed today: Creatinine: 1.12, calcium : 7.5, alkaline phosphatase: 1413, AST: 86, ALT: 158, bilirubin: 0.9.  LDH: 1102.  CBC: Hemoglobin: 9.9, hematocrit: 32.0, normal WBC and platelets.  Uric acid: 8.3.   Plan: - Continue TLS labs twice daily. - Elevated liver enzymes likely secondary to porta hepatis involvement by the mass. - Continue prednisone  100 mg daily - Will continue to monitor patient.  Can consider rebiopsy of one of the more hypermetabolic areas to rule out DLBCL. -Hematology will continue to follow.  Please reach out with any questions or concerns.  Cody Palmer, AGNP-C Department of Hematology/Oncology [facility]  Phone: [phone]  05/05/2024 7:31 PM

## 2024-05-06 ENCOUNTER — Encounter (HOSPITAL_COMMUNITY): Payer: Self-pay | Admitting: Internal Medicine

## 2024-05-06 ENCOUNTER — Inpatient Hospital Stay (HOSPITAL_COMMUNITY)

## 2024-05-06 ENCOUNTER — Inpatient Hospital Stay

## 2024-05-06 DIAGNOSIS — C8299 Follicular lymphoma, unspecified, extranodal and solid organ sites: Secondary | ICD-10-CM

## 2024-05-06 DIAGNOSIS — N1831 Chronic kidney disease, stage 3a: Secondary | ICD-10-CM | POA: Diagnosis not present

## 2024-05-06 DIAGNOSIS — C8590 Non-Hodgkin lymphoma, unspecified, unspecified site: Secondary | ICD-10-CM | POA: Diagnosis not present

## 2024-05-06 DIAGNOSIS — I10 Essential (primary) hypertension: Secondary | ICD-10-CM | POA: Diagnosis not present

## 2024-05-06 DIAGNOSIS — K219 Gastro-esophageal reflux disease without esophagitis: Secondary | ICD-10-CM | POA: Diagnosis not present

## 2024-05-06 LAB — CBC
HCT: 29.5 % — ABNORMAL LOW (ref 39.0–52.0)
HCT: 29.7 % — ABNORMAL LOW (ref 39.0–52.0)
Hemoglobin: 8.9 g/dL — ABNORMAL LOW (ref 13.0–17.0)
Hemoglobin: 9.3 g/dL — ABNORMAL LOW (ref 13.0–17.0)
MCH: 26.8 pg (ref 26.0–34.0)
MCH: 27.8 pg (ref 26.0–34.0)
MCHC: 30.2 g/dL (ref 30.0–36.0)
MCHC: 31.3 g/dL (ref 30.0–36.0)
MCV: 88.9 fL (ref 80.0–100.0)
MCV: 88.9 fL (ref 80.0–100.0)
Platelets: 187 K/uL (ref 150–400)
Platelets: 194 K/uL (ref 150–400)
RBC: 3.32 MIL/uL — ABNORMAL LOW (ref 4.22–5.81)
RBC: 3.34 MIL/uL — ABNORMAL LOW (ref 4.22–5.81)
RDW: 20.5 % — ABNORMAL HIGH (ref 11.5–15.5)
RDW: 20.5 % — ABNORMAL HIGH (ref 11.5–15.5)
WBC: 4.7 K/uL (ref 4.0–10.5)
WBC: 5.3 K/uL (ref 4.0–10.5)
nRBC: 0 % (ref 0.0–0.2)
nRBC: 0 % (ref 0.0–0.2)

## 2024-05-06 LAB — PHOSPHORUS
Phosphorus: 4.4 mg/dL (ref 2.5–4.6)
Phosphorus: 3.6 mg/dL (ref 2.5–4.6)

## 2024-05-06 LAB — LACTATE DEHYDROGENASE
LDH: 962 U/L — ABNORMAL HIGH (ref 105–235)
LDH: 855 U/L — ABNORMAL HIGH (ref 105–235)

## 2024-05-06 LAB — COMPREHENSIVE METABOLIC PANEL WITH GFR
ALT: 121 U/L — ABNORMAL HIGH (ref 0–44)
ALT: 107 U/L — ABNORMAL HIGH (ref 0–44)
AST: 56 U/L — ABNORMAL HIGH (ref 15–41)
AST: 41 U/L (ref 15–41)
Albumin: 2.5 g/dL — ABNORMAL LOW (ref 3.5–5.0)
Albumin: 2.6 g/dL — ABNORMAL LOW (ref 3.5–5.0)
Alkaline Phosphatase: 1129 U/L — ABNORMAL HIGH (ref 38–126)
Alkaline Phosphatase: 1033 U/L — ABNORMAL HIGH (ref 38–126)
Anion gap: 11 (ref 5–15)
Anion gap: 11 (ref 5–15)
BUN: 31 mg/dL — ABNORMAL HIGH (ref 8–23)
BUN: 30 mg/dL — ABNORMAL HIGH (ref 8–23)
CO2: 25 mmol/L (ref 22–32)
CO2: 24 mmol/L (ref 22–32)
Calcium: 7.6 mg/dL — ABNORMAL LOW (ref 8.9–10.3)
Calcium: 7.5 mg/dL — ABNORMAL LOW (ref 8.9–10.3)
Chloride: 107 mmol/L (ref 98–111)
Chloride: 107 mmol/L (ref 98–111)
Creatinine, Ser: 0.95 mg/dL (ref 0.61–1.24)
Creatinine, Ser: 0.91 mg/dL (ref 0.61–1.24)
GFR, Estimated: >60 mL/min
GFR, Estimated: >60 mL/min
Glucose, Bld: 118 mg/dL — ABNORMAL HIGH (ref 70–99)
Glucose, Bld: 139 mg/dL — ABNORMAL HIGH (ref 70–99)
Potassium: 3.8 mmol/L (ref 3.5–5.1)
Potassium: 4 mmol/L (ref 3.5–5.1)
Sodium: 143 mmol/L (ref 135–145)
Sodium: 142 mmol/L (ref 135–145)
Total Bilirubin: 0.8 mg/dL (ref 0.0–1.2)
Total Bilirubin: 0.7 mg/dL (ref 0.0–1.2)
Total Protein: 4.4 g/dL — ABNORMAL LOW (ref 6.5–8.1)
Total Protein: 4.6 g/dL — ABNORMAL LOW (ref 6.5–8.1)

## 2024-05-06 LAB — URIC ACID
Uric Acid, Serum: 7.5 mg/dL (ref 3.7–8.6)
Uric Acid, Serum: 6.6 mg/dL (ref 3.7–8.6)

## 2024-05-06 LAB — GLUCOSE, CAPILLARY
Glucose-Capillary: 144 mg/dL — ABNORMAL HIGH (ref 70–99)
Glucose-Capillary: 141 mg/dL — ABNORMAL HIGH (ref 70–99)
Glucose-Capillary: 125 mg/dL — ABNORMAL HIGH (ref 70–99)
Glucose-Capillary: 110 mg/dL — ABNORMAL HIGH (ref 70–99)
Glucose-Capillary: 140 mg/dL — ABNORMAL HIGH (ref 70–99)

## 2024-05-06 MED ORDER — LIDOCAINE HCL (PF) 2 % IJ SOLN
10.0000 mL | Freq: Once | INTRAMUSCULAR | Status: AC
  Administered 2024-05-06: 10 mL
  Filled 2024-05-06: qty 10

## 2024-05-06 MED ORDER — ALBUMIN HUMAN 25 % IV SOLN
25.0000 g | Freq: Once | INTRAVENOUS | Status: AC
  Administered 2024-05-06: 25 g via INTRAVENOUS
  Filled 2024-05-06: qty 100

## 2024-05-06 MED ORDER — LIDOCAINE HCL (PF) 2 % IJ SOLN
INTRAMUSCULAR | Status: AC
  Filled 2024-05-06: qty 10

## 2024-05-06 NOTE — Progress Notes (Signed)
 " PROGRESS NOTE    Cody Palmer  FMW:[id number] DOB: 05/20/1932 DOA: 05/02/2024 PCP: [name], NP   Brief Narrative:  As per H&P written by Dr. Sherlon on 05/02/2024 Cody Palmer  is a 89 y.o. male, with stage IIIa CKD, hypertension, hyperlipidemia, type 2 diabetes mellitus, controlled, with recent hospitalization due to AKI, workup significant for non-Hodgkin's lymphoma. - Patient started on antibiotic during recent hospitalization for SBP, and recurrent ascites, fluid biopsy was significant for none Hodgkin's lymphoma, he had paracentesis yesterday with 4 L drained, follow-up with the department this morning, where they recommend initiation of  immunotherapy given he is high risk for tumor lysis syndrome admission has been recommended. - Labs and workup at oncology clinic today was elevated liver enzymes, creatinine at baseline, potassium low at 3.3, he had paracentesis yesterday done with 4 L drained, does not appear there were any labs sent.  Have discussed with oncology, who will initiate rituximab  immunotherapy tomorrow at 8:30 a.m., premedications and rituximab  although will be taking care of by oncology .  Assessment & Plan: 1-non-Hodgkin lymphoma - Patient has been started on reducing IV infusion - High risk for TLS - Continue to follow oncology service recommendation - Maintain adequate hydration - Follow CMP, CBC, uric acid, phosphorus and LDH.  Per oncology service transition to once a day only; goal numbers continue trending down adequately. - Steroids will be discontinue after today's dosage. - Planning for IR guided biopsy of the retroperitoneal/para-aortic lymph node in order to assist ruling out DLBCL. - Continue to maintain adequate hydration. - Patient is no longer demonstrating any jaundice and his LFTs has continue adequately improving. - After lymph node biopsy done patient will be able to go back to skilled nursing facility for rehabilitation. -  Oncology service will arrange outpatient follow-up for second rituximab  infusion.  2-transaminitis/recurring ascites and hyperbilirubinemia - Continue to follow LFTs trend - Most likely associated with obstruction/hepatic stasis due to ongoing lymphoma -LFTs trending down.  Continue to monitor levels intermittently. - Status post paracentesis on 05/01/2024 - Status post paracentesis; 3 L successfully removed. - Albumin  will be provided. - Continue treatment with ceftriaxone  for SBP prophylaxis. - Continue minimizing hepatotoxic agents - Continue to maintain adequate hydration - Continue to follow clinical response.  3-chronic kidney disease stage IIIb - Appears to be stable and currently at baseline - Continue to follow renal function trend. - Excellent response to fluid and Lasix ; renal function at the best level in a while after intervention. - Continue to follow trend intermittently.  4-essential hypertension - Blood pressure stable - Follow-up vital sign - Holding antihypertensive agents to minimize the chance of hypotension.  5-type 2 diabetes - Sliding scale insulin  has been started - Hold oral hypoglycemic agent - Follow CBG fluctuation.  6-hypokalemia - Continue to follow ultralights and further replete as needed. - Continue telemetry monitoring.   DVT prophylaxis: Heparin  Code Status: DNR/DNI. Family Communication: No family at bedside. Disposition:   Status is: Inpatient Remains inpatient appropriate because: Continue IV therapy.   Consultants:  Hematology oncology  Procedures:  See below for x-ray reports.  Antimicrobials:  Rocephin    Subjective: Feeling much better and noticing significant improvement in his abdominal distention and lower extremity swelling.  Patient is afebrile, no chest pain, no nausea, no vomiting, no fever.  Good saturation appreciated on room air.  Objective: Vitals:   05/06/24 0920 05/06/24 0930 05/06/24 0940 05/06/24 1312   BP: (!) 120/56 125/60 128/62 125/62  Pulse: 96  98 95 (!) 105  Resp: (!) 22 20 20    Temp:    98.1 F (36.7 C)  TempSrc:    Oral  SpO2: 99% 100% 96% 94%    Intake/Output Summary (Last 24 hours) at 05/06/2024 1718 Last data filed at 05/06/2024 1300 Gross per 24 hour  Intake 1080 ml  Output 2900 ml  Net -1820 ml   There were no vitals filed for this visit.  Examination: General exam: Alert, awake, oriented x 3; feeling much better and breathing easier after tolerating 3 L removal with paracentesis. Respiratory system: No using accessory muscles. Cardiovascular system: Rate controlled, no rubs, no gallops, no JVD. Gastrointestinal system: Abdomen is significantly less distended, nontender, positive bowel sounds and without guarding. Central nervous system: Moving 4 limbs spontaneously.  No focal neurological deficits. Extremities: No cyanosis or clubbing; trace edema appreciated bilaterally. Skin: No petechiae. Psychiatry: Judgement and insight appear normal. Mood & affect appropriate.   Data Reviewed: I have personally reviewed following labs and imaging studies  CBC: Recent Labs  Lab 05/02/24 1349 05/03/24 0458 05/04/24 1656 05/05/24 0333 05/05/24 1635 05/06/24 0406 05/06/24 1649  WBC 9.3   < > 6.9 5.7 5.4 4.7 5.3  NEUTROABS 5.8  --   --   --   --   --   --   HGB 10.7*   < > 9.4* 8.9* 9.9* 8.9* 9.3*  HCT 33.4*   < > 30.0* 28.7* 32.0* 29.5* 29.7*  MCV 86.8   < > 89.0 87.8 89.1 88.9 88.9  PLT 261   < > 212 209 221 187 194   < > = values in this interval not displayed.    Basic Metabolic Panel: Recent Labs  Lab 05/02/24 1349 05/02/24 1630 05/04/24 1656 05/05/24 0333 05/05/24 1635 05/06/24 0406 05/06/24 1649  NA  --    < > 143 142 142 143 142  K  --    < > 3.8 3.7 3.8 3.8 4.0  CL  --    < > 104 104 104 107 107  CO2  --    < > 25 25 24 25 24   GLUCOSE  --    < > 134* 107* 138* 118* 139*  BUN  --    < > 42* 38* 34* 31* 30*  CREATININE  --    < > 1.31* 1.26*  1.12 0.95 0.91  CALCIUM   --    < > 7.5* 7.3* 7.5* 7.6* 7.5*  MG 2.0  --   --   --   --   --   --   PHOS 3.8   < > 5.6* 4.8* 4.5 4.4 3.6   < > = values in this interval not displayed.    GFR: Estimated Creatinine Clearance: 61.6 mL/min (by C-G formula based on SCr of 0.91 mg/dL).  Liver Function Tests: Recent Labs  Lab 05/04/24 1656 05/05/24 0333 05/05/24 1635 05/06/24 0406 05/06/24 1649  AST 215* 151* 86* 56* 41  ALT 205* 183* 158* 121* 107*  ALKPHOS 1,612* 1,492* 1,413* 1,129* 1,033*  BILITOT 1.9* 1.2 0.9 0.8 0.7  PROT 4.5* 4.3* 4.7* 4.4* 4.6*  ALBUMIN  2.6* 2.4* 2.6* 2.5* 2.6*    CBG: Recent Labs  Lab 05/05/24 1722 05/05/24 2000 05/05/24 2126 05/06/24 0726 05/06/24 1110  GLUCAP 144* 141* 125* 110* 140*     Recent Results (from the past 240 hours)  MRSA Next Gen by PCR, Nasal     Status: None   Collection Time: 05/02/24  3:30 PM   Specimen: Nasal Mucosa; Nasal Swab  Result Value Ref Range Status   MRSA by PCR Next Gen NOT DETECTED NOT DETECTED Final    Comment: (NOTE) The GeneXpert MRSA Assay (FDA approved for NASAL specimens only), is one component of a comprehensive MRSA colonization surveillance program. It is not intended to diagnose MRSA infection nor to guide or monitor treatment for MRSA infections. Test performance is not FDA approved in patients less than 43 years old. Performed at [facility], [address] [place], [place] [address]      Radiology Studies: US  Paracentesis Result Date: 05/06/2024 INDICATION: [id number] Ascites 2963 89 year old male. History of lymphoma with recurrent malignant ascites. Request is for therapeutic paracentesis EXAM: ULTRASOUND GUIDED THERAPEUTIC PARACENTESIS MEDICATIONS: Lidocaine  1% 10 mL COMPLICATIONS: None immediate. PROCEDURE: Informed written consent was obtained from the patient after a discussion of the risks, benefits and alternatives to treatment. A timeout was performed prior to the initiation of the  procedure. Initial ultrasound scanning demonstrates a moderate amount of ascites within the right lower abdominal quadrant. The right lower abdomen was prepped and draped in the usual sterile fashion. 1% lidocaine  was used for local anesthesia. Following this, a 19 gauge, 7-cm, Yueh catheter was introduced. An ultrasound image was saved for documentation purposes. The paracentesis was performed. The catheter was removed and a dressing was applied. The patient tolerated the procedure well without immediate post procedural complication. FINDINGS: A total of approximately 3 L of straw-colored fluid was removed. IMPRESSION: Successful ultrasound-guided therapeutic paracentesis yielding 3.0 L of peritoneal fluid. Performed by Delon Beagle NP under supervision of Thom Hall, MD Electronically Signed   By: Thom Hall M.D.   On: 05/06/2024 10:39     Scheduled Meds:  allopurinol   150 mg Oral Daily   Chlorhexidine  Gluconate Cloth  6 each Topical Q0600   heparin   5,000 Units Subcutaneous Q8H   insulin  aspart  0-5 Units Subcutaneous QHS   insulin  aspart  0-9 Units Subcutaneous TID WC   pantoprazole   40 mg Oral Daily   Continuous Infusions:  sodium chloride  Stopped (05/03/24 1507)   sodium chloride  Stopped (05/03/24 1105)   cefTRIAXone  (ROCEPHIN )  IV 2 g (05/06/24 1703)     LOS: 4 days    Time spent: 50 minutes    Eric Nunnery, MD [facility]   To contact the attending provider between 7A-7P or the covering provider during after hours 7P-7A, please log into the web site [email or web address] and access using universal [place] password for that web site. If you do not have the password, please call the hospital operator.  05/06/2024, 5:18 PM    "

## 2024-05-06 NOTE — Consult Note (Signed)
 Hematology progress note  Patient was seen at bedside today.  He is accompanied by his daughter and brother.  He reports feeling better today after having some paracentesis done.  Labs reviewed today: CMP: Improved alkaline phosphatase, LFTs.  CBC: Hemoglobin 8.9, normal WBC and platelets. Patient has normal uric acid and LDH is trending down.  Plan: -Please schedule for IR guided biopsy of the retroperitoneal/para-aortic lymph node.  Discussed with IR and Dr. Ricky. - Stop prednisone  at this time. - Continue paracentesis as needed.  Please send flow cytometry with next paracentesis. - Can decrease TLS labs to once daily along with a.m. labs. - If patient is stable for discharge, can be discharged after biopsy from hematology perspective. We will make sure patient has a follow-up with us  to get second dose of rituximab  outpatient.  Please to reach out with any questions or concerns.  Mickiel Dry, MD Hematology/Oncology [facility]

## 2024-05-06 NOTE — Evaluation (Signed)
 Occupational Therapy Evaluation Patient Details Name: Cody Palmer MRN: [id number] DOB: Oct 22, 1932 Today's Date: 05/06/2024   History of Present Illness   Cody Palmer  is a 89 y.o. male, with stage IIIa CKD, hypertension, hyperlipidemia, type 2 diabetes mellitus, controlled, with recent hospitalization due to AKI, workup significant for non-Hodgkin's lymphoma.  - Patient started on antibiotic during recent hospitalization for SBP, and recurrent ascites, fluid biopsy was significant for none Hodgkin's lymphoma, he had paracentesis yesterday with 4 L drained, follow-up with the department this morning, where they recommend initiation of  immunotherapy given he is high risk for tumor lysis syndrome admission has been recommended.  - Labs and workup at oncology clinic today was elevated liver enzymes, creatinine at baseline, potassium low at 3.3, he had paracentesis yesterday done with 4 L drained, does not appear there were any labs sent.  Have discussed with oncology, who will initiate rituximab  immunotherapy tomorrow at 8:30 a.m., premedications and rituximab  although will be taking care of by oncology . (per MD)     Clinical Impressions Pt agreeable to OT evaluation and PT treatment today. Pt at level of set up assist for seated ADL's. CGA to supervision for standing tasks and ambulation with RW for safety. Pt able to doff and don sock without assist. Pt left in the chair with call bell within reach. Pt will benefit from continued OT in the hospital to increase strength, balance, and endurance for safe ADL's.        If plan is discharge home, recommend the following:   A little help with walking and/or transfers;A little help with bathing/dressing/bathroom;Assistance with cooking/housework;Assist for transportation     Functional Status Assessment   Patient has had a recent decline in their functional status and demonstrates the ability to make significant improvements in function in  a reasonable and predictable amount of time.       Precautions/Restrictions   Precautions Precautions: Fall Recall of Precautions/Restrictions: Intact Restrictions Weight Bearing Restrictions Per Provider Order: No     Mobility Bed Mobility Overal bed mobility: Needs Assistance Bed Mobility: Supine to Sit     Supine to sit: Min assist     General bed mobility comments: Single hand held assist to pull to sit.    Transfers Overall transfer level: Needs assistance Equipment used: Rolling walker (2 wheels) Transfers: Sit to/from Stand, Bed to chair/wheelchair/BSC Sit to Stand: Supervision     Step pivot transfers: Supervision, Contact guard assist     General transfer comment: Mild labored effort.      Balance Overall balance assessment: Needs assistance Sitting-balance support: No upper extremity supported, Feet supported Sitting balance-Leahy Scale: Good Sitting balance - Comments: seated at EOB   Standing balance support: During functional activity, Bilateral upper extremity supported Standing balance-Leahy Scale: Fair Standing balance comment: using RW                           ADL either performed or assessed with clinical judgement   ADL Overall ADL's : Needs assistance/impaired     Grooming: Supervision/safety;Standing   Upper Body Bathing: Set up;Sitting   Lower Body Bathing: Set up;Sitting/lateral leans   Upper Body Dressing : Set up;Sitting   Lower Body Dressing: Set up;Sitting/lateral leans   Toilet Transfer: Supervision/safety;Ambulation;Rolling walker (2 wheels);Contact guard assist Toilet Transfer Details (indicate cue type and reason): Simulated via ambulation in the hall. Toileting- Clothing Manipulation and Hygiene: Supervision/safety;Sitting/lateral lean;Sit to/from stand  Functional mobility during ADLs: Supervision/safety;Rolling walker (2 wheels);Contact guard assist General ADL Comments: Able to ambualte  several feet in the hall with RW.     Vision Baseline Vision/History: 2 Legally blind;1 Wears glasses (Missing R eye.) Ability to See in Adequate Light: 2 Moderately impaired Patient Visual Report: No change from baseline Vision Assessment?: No apparent visual deficits     Perception Perception: Not tested       Praxis Praxis: Not tested       Pertinent Vitals/Pain Pain Assessment Pain Assessment: No/denies pain     Extremity/Trunk Assessment Upper Extremity Assessment Upper Extremity Assessment: Generalized weakness   Lower Extremity Assessment Lower Extremity Assessment: Defer to PT evaluation   Cervical / Trunk Assessment Cervical / Trunk Assessment: Normal   Communication Communication Communication: No apparent difficulties   Cognition Arousal: Alert Behavior During Therapy: WFL for tasks assessed/performed Cognition: No apparent impairments                               Following commands: Intact       Cueing  General Comments   Cueing Techniques: Verbal cues                 Home Living Family/patient expects to be discharged to:: Skilled nursing facility Living Arrangements: Alone Available Help at Discharge: Family;Available PRN/intermittently Type of Home: House Home Access: Stairs to enter [employer] of Steps: 3 Entrance Stairs-Rails: Right;Left;Can reach both Home Layout: One level     Bathroom Shower/Tub: Chief Strategy Officer: Standard Bathroom Accessibility: Yes How Accessible: Accessible via walker Home Equipment: Rolling Walker (2 wheels);Cane - single point;Grab bars - tub/shower   Additional Comments: Infor per recent admission.      Prior Functioning/Environment Prior Level of Function : Independent/Modified Independent;Driving             Mobility Comments: Community ambulation using RW PRN, drives ADLs Comments: Independent prior to recent issues.    OT Problem List:  Decreased strength;Decreased activity tolerance;Impaired balance (sitting and/or standing)   OT Treatment/Interventions: Self-care/ADL training;Therapeutic exercise;Therapeutic activities;Patient/family education;Balance training      OT Goals(Current goals can be found in the care plan section)   Acute Rehab OT Goals Patient Stated Goal: Continue to improve function. OT Goal Formulation: With patient Time For Goal Achievement: 05/20/24 Potential to Achieve Goals: Good   OT Frequency:  Min 1X/week    Co-evaluation PT/OT/SLP Co-Evaluation/Treatment: Yes Reason for Co-Treatment: To address functional/ADL transfers   OT goals addressed during session: ADL's and self-care                       End of Session Equipment Utilized During Treatment: Rolling walker (2 wheels)  Activity Tolerance: Patient tolerated treatment well Patient left: in chair;with call bell/phone within reach  OT Visit Diagnosis: Unsteadiness on feet (R26.81);Other abnormalities of gait and mobility (R26.89);Muscle weakness (generalized) (M62.81)                Time: 8969-8957 OT Time Calculation (min): 12 min Charges:  OT General Charges $OT Visit: 1 Visit OT Evaluation $OT Eval Low Complexity: 1 Low  Rodric Punch OT, MOT   Jayson Person 05/06/2024, 12:21 PM

## 2024-05-06 NOTE — Progress Notes (Signed)
 Patient tolerated right sided Paracentesis procedure well today and 3 Liters of clear yellow ascites removed. Patient verbalized understanding of post procedure instructions and transported via stretcher back to inpatient bed assignment with no acute distress noted.

## 2024-05-06 NOTE — Evaluation (Signed)
 Physical Therapy Evaluation Patient Details Name: Cody Palmer MRN: [id number] DOB: September 08, 1932 Today's Date: 05/06/2024  History of Present Illness  Cody Palmer  is a 89 y.o. male, with stage IIIa CKD, hypertension, hyperlipidemia, type 2 diabetes mellitus, controlled, with recent hospitalization due to AKI, workup significant for non-Hodgkin's lymphoma.  - Patient started on antibiotic during recent hospitalization for SBP, and recurrent ascites, fluid biopsy was significant for none Hodgkin's lymphoma, he had paracentesis yesterday with 4 L drained, follow-up with the department this morning, where they recommend initiation of  immunotherapy given he is high risk for tumor lysis syndrome admission has been recommended.  - Labs and workup at oncology clinic today was elevated liver enzymes, creatinine at baseline, potassium low at 3.3, he had paracentesis yesterday done with 4 L drained, does not appear there were any labs sent.  Have discussed with oncology, who will initiate rituximab  immunotherapy tomorrow at 8:30 a.m., premedications and rituximab  although will be taking care of by oncology .   Clinical Impression  Patient demonstrates sightly labored movement for sitting up at bedside, fair/good return for transferring to/from chair using RW and tolerated ambulating in room, hallway without loss of balance, but limited mostly due to increasing HR up to 125 and fatigue. Patient tolerated sitting up in chair after therapy. Patient will benefit from continued skilled physical therapy in hospital and recommended venue below to increase strength, balance, endurance for safe ADLs and gait.           If plan is discharge home, recommend the following: A little help with walking and/or transfers;A little help with bathing/dressing/bathroom;Help with stairs or ramp for entrance;Assistance with cooking/housework;Assist for transportation   Can travel by private vehicle   Yes    Equipment  Recommendations    Recommendations for Other Services       Functional Status Assessment Patient has had a recent decline in their functional status and demonstrates the ability to make significant improvements in function in a reasonable and predictable amount of time.     Precautions / Restrictions Precautions Precautions: Fall Recall of Precautions/Restrictions: Intact Restrictions Weight Bearing Restrictions Per Provider Order: No      Mobility  Bed Mobility Overal bed mobility: Needs Assistance Bed Mobility: Supine to Sit     Supine to sit: Min assist     General bed mobility comments: increased time, labored movement    Transfers Overall transfer level: Needs assistance Equipment used: Rolling walker (2 wheels) Transfers: Sit to/from Stand, Bed to chair/wheelchair/BSC Sit to Stand: Supervision   Step pivot transfers: Supervision, Contact guard assist       General transfer comment: slightly labored movement    Ambulation/Gait Ambulation/Gait assistance: Contact guard assist, Min assist Gait Distance (Feet): 60 Feet Assistive device: Rolling walker (2 wheels) Gait Pattern/deviations: Decreased step length - right, Decreased step length - left, Decreased stride length Gait velocity: decreased     General Gait Details: slightly labored movement without loss of balance, but HR increased to 125 and limited mostly due to fatigue  Stairs            Wheelchair Mobility     Tilt Bed    Modified Rankin (Stroke Patients Only)       Balance Overall balance assessment: Needs assistance Sitting-balance support: Feet supported, No upper extremity supported Sitting balance-Leahy Scale: Good Sitting balance - Comments: seated at EOB   Standing balance support: During functional activity, Bilateral upper extremity supported Standing balance-Leahy Scale: Fair Standing balance comment:  using RW                             Pertinent  Vitals/Pain Pain Assessment Pain Assessment: No/denies pain    Home Living Family/patient expects to be discharged to:: Private residence Living Arrangements: Alone Available Help at Discharge: Family;Available PRN/intermittently Type of Home: House Home Access: Stairs to enter Entrance Stairs-Rails: Right;Left;Can reach both Entrance Stairs-Number of Steps: 3   Home Layout: One level Home Equipment: Agricultural Consultant (2 wheels);Cane - single point;Grab bars - tub/shower Additional Comments: Infor per recent admission.    Prior Function Prior Level of Function : Independent/Modified Independent;Driving             Mobility Comments: Community ambulation using RW PRN, drives ADLs Comments: Independent prior to recent issues.     Extremity/Trunk Assessment   Upper Extremity Assessment Upper Extremity Assessment: Defer to OT evaluation    Lower Extremity Assessment Lower Extremity Assessment: Generalized weakness    Cervical / Trunk Assessment Cervical / Trunk Assessment: Normal  Communication   Communication Communication: No apparent difficulties    Cognition Arousal: Alert Behavior During Therapy: WFL for tasks assessed/performed   PT - Cognitive impairments: No apparent impairments                         Following commands: Intact       Cueing Cueing Techniques: Verbal cues     General Comments      Exercises     Assessment/Plan    PT Assessment Patient needs continued PT services  PT Problem List Decreased strength;Decreased activity tolerance;Decreased balance;Decreased mobility       PT Treatment Interventions DME instruction;Gait training;Stair training;Functional mobility training;Therapeutic activities;Therapeutic exercise;Balance training;Patient/family education    PT Goals (Current goals can be found in the Care Plan section)  Acute Rehab PT Goals Patient Stated Goal: return home after rehab PT Goal Formulation: With  patient Time For Goal Achievement: 05/23/24 Potential to Achieve Goals: Good    Frequency Min 3X/week     Co-evaluation PT/OT/SLP Co-Evaluation/Treatment: Yes Reason for Co-Treatment: To address functional/ADL transfers PT goals addressed during session: Mobility/safety with mobility;Balance;Proper use of DME OT goals addressed during session: ADL's and self-care       AM-PAC PT 6 Clicks Mobility  Outcome Measure Help needed turning from your back to your side while in a flat bed without using bedrails?: A Little Help needed moving from lying on your back to sitting on the side of a flat bed without using bedrails?: A Little Help needed moving to and from a bed to a chair (including a wheelchair)?: A Little Help needed standing up from a chair using your arms (e.g., wheelchair or bedside chair)?: A Little Help needed to walk in hospital room?: A Little Help needed climbing 3-5 steps with a railing? : A Lot 6 Click Score: 17    End of Session   Activity Tolerance: Patient tolerated treatment well;Patient limited by fatigue Patient left: in chair;with call bell/phone within reach Nurse Communication: Mobility status PT Visit Diagnosis: Other abnormalities of gait and mobility (R26.89);Difficulty in walking, not elsewhere classified (R26.2);Muscle weakness (generalized) (M62.81)    Time: 8970-8958 PT Time Calculation (min) (ACUTE ONLY): 12 min   Charges:   PT Evaluation $PT Eval Low Complexity: 1 Low PT Treatments $Therapeutic Activity: 8-22 mins PT General Charges $$ ACUTE PT VISIT: 1 Visit  2:52 PM, 05/06/2024 Lynwood Music, MPT Physical Therapist with [facility] [phone] office [phone] mobile phone

## 2024-05-06 NOTE — Progress Notes (Signed)
 Mobility Specialist Progress Note:    05/06/24 1315  Mobility  Activity Ambulated with assistance  Level of Assistance Minimal assist, patient does 75% or more  Assistive Device Front wheel walker  Distance Ambulated (ft) 35 ft  Range of Motion/Exercises Active;All extremities  Activity Response Tolerated well  Mobility Referral Yes  Mobility visit 1 Mobility  Mobility Specialist Start Time (ACUTE ONLY) 1315  Mobility Specialist Stop Time (ACUTE ONLY) 1335  Mobility Specialist Time Calculation (min) (ACUTE ONLY) 20 min   Pt received sitting EOB, agreeable to mobility. Required MinA to stand and CGA to ambulate with RW. Tolerated well, asx throughout. Left supine, family in room. All needs met.  Amiel Mccaffrey Mobility Specialist Please contact via Special Educational Needs Teacher or  Rehab office at [phone]

## 2024-05-06 NOTE — Plan of Care (Signed)
   Problem: Education: Goal: Knowledge of General Education information will improve Description: Including pain rating scale, medication(s)/side effects and non-pharmacologic comfort measures Outcome: Progressing   Problem: Clinical Measurements: Goal: Ability to maintain clinical measurements within normal limits will improve Outcome: Progressing

## 2024-05-06 NOTE — Plan of Care (Signed)
" °  Problem: Acute Rehab PT Goals(only PT should resolve) Goal: Pt Will Go Supine/Side To Sit Outcome: Progressing Flowsheets (Taken 05/06/2024 1454) Pt will go Supine/Side to Sit:  with supervision  with modified independence Goal: Patient Will Transfer Sit To/From Stand Outcome: Progressing Flowsheets (Taken 05/06/2024 1454) Patient will transfer sit to/from stand:  with supervision  with maximum assist Goal: Pt Will Transfer Bed To Chair/Chair To Bed Outcome: Progressing Flowsheets (Taken 05/06/2024 1454) Pt will Transfer Bed to Chair/Chair to Bed:  with supervision  with modified independence Goal: Pt Will Ambulate Outcome: Progressing Flowsheets (Taken 05/06/2024 1454) Pt will Ambulate:  75 feet  with supervision  with contact guard assist  with rolling walker   2:54 PM, 05/06/2024 Lynwood Music, MPT Physical Therapist with [facility] [phone] office [phone] mobile phone  "

## 2024-05-06 NOTE — Plan of Care (Signed)
" °  Problem: Acute Rehab OT Goals (only OT should resolve) Goal: Pt. Will Perform Grooming Flowsheets (Taken 05/06/2024 1223) Pt Will Perform Grooming: with modified independence Goal: Pt. Will Transfer To Toilet Flowsheets (Taken 05/06/2024 1223) Pt Will Transfer to Toilet: with modified independence Goal: Pt. Will Perform Toileting-Clothing Manipulation Flowsheets (Taken 05/06/2024 1223) Pt Will Perform Toileting - Clothing Manipulation and hygiene: with modified independence Goal: Pt/Caregiver Will Perform Home Exercise Program Flowsheets (Taken 05/06/2024 1223) Pt/caregiver will Perform Home Exercise Program:  Increased strength  Both right and left upper extremity  Independently  Paitlyn Mcclatchey OT, MOT  "

## 2024-05-06 NOTE — Procedures (Signed)
Ultrasound-guided  therapeutic paracentesis performed yielding 3 liters of straw colored fluid.  No immediate complications. EBL is none.       

## 2024-05-06 NOTE — Consult Note (Signed)
 "     Chief Complaint: Hypermetabolic Lymph Node. Request is for Retroperitoneal Lymph Node Biopsy.  Referring Physician(s): Davonna Siad  Supervising Physician: Hughes Simmonds  Patient Status: [facility] -  Inpatient  History of Present Illness: Cody Palmer is a 89 y.o. male  History of CKD, HTN, HLD, DM. 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 . Team is concerned for a more aggressive type of lymphoma. Initial request for mesenteric biopsy. After review of PET scan from 12.19.25 IR Attending Dr. Simmonds Hughes recommends retroperitoneal lymph node biopsy. This was communicated to the Team who is in agreement with the biopsy.  Daughter, brother and sister in law at bedside. Currently without any significant complaints. Patient alert and laying in bed,calm. Denies any fevers, headache, chest pain, SOB, cough, abdominal pain, nausea, vomiting or bleeding.   Alkaline phosphatase 1129, albumin  2.5, ast 56, alt 121, Patient is on subcutaneous prophylactic dose of  heparin . All other labs and medications are within acceptable parameters.   Return precautions and treatment recommendations and follow-up discussed with the patient and hi family. All who are agreeable with the plan.   The original DNR order is maintained and prior treatment limitations are upheld.    Past Medical History:  Diagnosis Date   Arthritis    Cancer (HCC)    Skin cancer   Diabetes mellitus without complication (HCC)    HTN (hypertension)    Hypercholesteremia     Past Surgical History:  Procedure Laterality Date   CATARACT EXTRACTION W/PHACO Left 04/25/2016   Procedure: CATARACT EXTRACTION PHACO AND INTRAOCULAR LENS PLACEMENT LEFT EYE;  Surgeon: Cherene Mania, MD;  Location: [facility];  Service: Ophthalmology;  Laterality: Left;  CDE: 10.37   COLONOSCOPY N/A 04/05/2024   Procedure: COLONOSCOPY;  Surgeon: Cindie Carlin POUR, DO;  Location: [facility];  Service: Endoscopy;   Laterality: N/A;   CYSTOSCOPY WITH BIOPSY N/A 08/13/2013   Procedure: CYSTOSCOPY WITH BLADDER BIOPSY;  Surgeon: Garnette Shack, MD;  Location: [facility];  Service: Urology;  Laterality: N/A;   ENUCLEATION Right    Removed in 1958   EYE SURGERY  1958   removal of eye   TOTAL KNEE ARTHROPLASTY Right 01/11/2013   Procedure: RIGHT TOTAL KNEE ARTHROPLASTY;  Surgeon: Taft FORBES Minerva, MD;  Location: [facility];  Service: Orthopedics;  Laterality: Right;   TOTAL KNEE ARTHROPLASTY Left 07/12/2022   Procedure: TOTAL KNEE ARTHROPLASTY;  Surgeon: Minerva Taft FORBES, MD;  Location: [facility];  Service: Orthopedics;  Laterality: Left;    Allergies: Rituximab   Medications: Prior to Admission medications  Medication Sig Start Date End Date Taking? Authorizing Provider  allopurinol  (ZYLOPRIM ) 300 MG tablet Take 0.5 tablets (150 mg total) by mouth daily. 04/25/24  Yes Ricky Fines, MD  aspirin  EC 81 MG tablet Take 81 mg by mouth daily. Swallow whole.   Yes [provider]  Cholecalciferol  (VITAMIN D3) 25 MCG (1000 UT) CAPS Take 1,000 Units by mouth daily.   Yes [provider]  fluticasone  (FLONASE ) 50 MCG/ACT nasal spray Place 2 sprays into both nostrils daily. 04/25/24  Yes Ricky Fines, MD  furosemide  (LASIX ) 40 MG tablet Take 1 tablet (40 mg total) by mouth daily. 04/24/24 04/24/25 Yes Ricky Fines, MD  labetalol  (NORMODYNE ) 100 MG tablet Take 1 tablet (100 mg total) by mouth 2 (two) times daily. 04/24/24  Yes Ricky Fines, MD  loratadine  (CLARITIN ) 10 MG tablet Take 1 tablet (10 mg total) by mouth daily. 04/25/24  Yes Ricky Fines, MD  Multiple Vitamins-Minerals (EYE HEALTH AREDS 2 PO) Take 1 capsule by mouth 2 (two) times daily.   Yes [provider]  omeprazole (PRILOSEC) 40 MG capsule Take 40 mg by mouth daily.   Yes [provider]  oxyCODONE  (OXY IR/ROXICODONE ) 5 MG immediate release tablet Take 1 tablet (5 mg total) by mouth every 6 (six) hours as needed  for severe pain (pain score 7-10). 04/24/24  Yes Ricky Fines, MD  polyethylene glycol (MIRALAX ) 17 g packet Take 17 g by mouth daily as needed for mild constipation or moderate constipation. 04/06/24  Yes Shah, Pratik D, DO  prochlorperazine  (COMPAZINE ) 10 MG tablet Take 1 tablet (10 mg total) by mouth every 6 (six) hours as needed for nausea or vomiting. 04/23/24  Yes Kandala, Hyndavi, MD  rosuvastatin  (CRESTOR ) 10 MG tablet Take 10 mg by mouth every other day.   Yes [provider]  simethicone  (MYLICON) 80 MG chewable tablet Chew 1 tablet (80 mg total) by mouth every 6 (six) hours as needed for flatulence. 04/06/24  Yes Maree, Pratik D, DO  SITagliptin  25 MG TABS Take 12.5 mg by mouth daily at 6 (six) AM. 04/24/24  Yes Ricky Fines, MD  Nutritional Supplements (GLUCERNA 1.0 CAL/FIBER) LIQD Take 1 Bottle by mouth 2 (two) times daily between meals. 04/24/24   Ricky Fines, MD     Family History  Problem Relation Age of Onset   Arthritis Unknown    Cancer Unknown    Diabetes Unknown     Social History   Socioeconomic History   Marital status: Widowed    Spouse name: Not on file   Number of children: Not on file   Years of education: Not on file   Highest education level: Not on file  Occupational History   Not on file  Tobacco Use   Smoking status: Former    Current packs/day: 0.00    Average packs/day: 1 pack/day for 8.0 years (8.0 ttl pk-yrs)    Types: Cigarettes    Start date: 01/07/1957    Quit date: 01/07/1965    Years since quitting: 59.3   Smokeless tobacco: Never  Vaping Use   Vaping status: Never Used  Substance and Sexual Activity   Alcohol use: No   Drug use: No   Sexual activity: Yes    Birth control/protection: None  Other Topics Concern   Not on file  Social History Narrative   Not on file   Social Drivers of Health   Tobacco Use: Medium Risk (05/06/2024)   Patient History    Smoking Tobacco Use: Former    Smokeless Tobacco Use: Never     Passive Exposure: Not on Actuary Strain: Not on file  Food Insecurity: No Food Insecurity (05/02/2024)   Epic    Worried About Programme Researcher, Broadcasting/film/video in the Last Year: Never true    Ran Out of Food in the Last Year: Never true  Transportation Needs: Patient Declined (05/02/2024)   Epic    Lack of Transportation (Medical): Patient declined    Lack of Transportation (Non-Medical): Patient declined  Physical Activity: Not on file  Stress: Not on file  Social Connections: Patient Declined (05/02/2024)   Social Connection and Isolation Panel    Frequency of Communication with Friends and Family: Patient declined    Frequency of Social Gatherings with Friends and Family: Patient declined    Attends Religious Services: Patient declined    Active Member of Clubs or Organizations: Patient declined  Attends Banker Meetings: Patient declined    Marital Status: Patient declined  Recent Concern: Social Connections - Moderately Isolated (04/10/2024)   Social Connection and Isolation Panel    Frequency of Communication with Friends and Family: More than three times a week    Frequency of Social Gatherings with Friends and Family: More than three times a week    Attends Religious Services: 1 to 4 times per year    Active Member of [employer] or Organizations: No    Attends Banker Meetings: Never    Marital Status: Widowed  Depression (PHQ2-9): Low Risk (05/02/2024)   Depression (PHQ2-9)    PHQ-2 Score: 0  Alcohol Screen: Not on file  Housing: Patient Declined (05/02/2024)   Epic    Unable to Pay for Housing in the Last Year: Patient declined    Number of Times Moved in the Last Year: Not on file    Homeless in the Last Year: Patient declined  Utilities: Patient Declined (05/02/2024)   Epic    Threatened with loss of utilities: Patient declined  Health Literacy: Not on file     Review of Systems: A 12 point ROS discussed and pertinent positives are  indicated in the HPI above.  All other systems are negative.  Review of Systems  Constitutional:  Negative for fever.  HENT:  Negative for congestion.   Respiratory:  Negative for cough and shortness of breath.   Cardiovascular:  Negative for chest pain.  Gastrointestinal:  Negative for abdominal pain.  Neurological:  Negative for headaches.  Psychiatric/Behavioral:  Negative for behavioral problems and confusion.     Vital Signs: BP 125/62 (BP Location: Left Arm)   Pulse (!) 105   Temp 98.1 F (36.7 C) (Oral)   Resp 20   SpO2 94%   Advance Care Plan: The advanced care plan/surrogate decision maker was discussed at the time of visit and the patient did not wish to discuss or was not able to name a surrogate decision maker or provide an advance care plan.    Physical Exam Vitals and nursing note reviewed.  Constitutional:      Appearance: He is well-developed.  HENT:     Head: Normocephalic.     Mouth/Throat:     Mouth: Mucous membranes are moist.  Cardiovascular:     Rate and Rhythm: Regular rhythm. Tachycardia present.  Pulmonary:     Effort: Pulmonary effort is normal.  Musculoskeletal:        General: Normal range of motion.     Cervical back: Normal range of motion.  Skin:    General: Skin is warm and dry.  Neurological:     General: No focal deficit present.     Mental Status: He is alert and oriented to person, place, and time. Mental status is at baseline.  Psychiatric:        Mood and Affect: Mood normal.        Behavior: Behavior normal.        Thought Content: Thought content normal.        Judgment: Judgment normal.     Imaging: US  Paracentesis Result Date: 05/06/2024 INDICATION: [id number] Ascites [id number] 89 year old male. History of lymphoma with recurrent malignant ascites. Request is for therapeutic paracentesis EXAM: ULTRASOUND GUIDED THERAPEUTIC PARACENTESIS MEDICATIONS: Lidocaine  1% 10 mL COMPLICATIONS: None immediate. PROCEDURE: Informed written  consent was obtained from the patient after a discussion of the risks, benefits and alternatives to treatment. A timeout was performed prior to  the initiation of the procedure. Initial ultrasound scanning demonstrates a moderate amount of ascites within the right lower abdominal quadrant. The right lower abdomen was prepped and draped in the usual sterile fashion. 1% lidocaine  was used for local anesthesia. Following this, a 19 gauge, 7-cm, Yueh catheter was introduced. An ultrasound image was saved for documentation purposes. The paracentesis was performed. The catheter was removed and a dressing was applied. The patient tolerated the procedure well without immediate post procedural complication. FINDINGS: A total of approximately 3 L of straw-colored fluid was removed. IMPRESSION: Successful ultrasound-guided therapeutic paracentesis yielding 3.0 L of peritoneal fluid. Performed by Delon Beagle NP under supervision of Thom Hall, MD Electronically Signed   By: Thom Hall M.D.   On: 05/06/2024 10:39   US  Abdomen Limited RUQ (LIVER/GB) Result Date: 05/03/2024 EXAM: Right Upper Quadrant Abdominal Ultrasound 05/03/2024 08:42:15 AM TECHNIQUE: Real-time ultrasonography of the right upper quadrant of the abdomen was performed. COMPARISON: US  Abdomen 04/11/2024 and PET CT fusion study dated 05/02/2024. CLINICAL HISTORY: Transaminitis. FINDINGS: LIVER: Normal echogenicity. There is an ill-defined mixed echogenic mass present within the porta hepatitis measuring approximately 9.7 x 9.7 x 5.7 cm, corresponding with the metabolically avid mass noted on the previous PET CT. There is a cyst within the right hepatic lobe measuring approximately 12 mm in diameter. No intrahepatic biliary ductal dilatation. Hepatopetal flow in the portal vein. BILIARY SYSTEM: Gallbladder wall thickness measures 5.9 mm. There is gallbladder wall edema. There is sludge within the gallbladder. The common bile duct measures 3.0 mm. OTHER:  There is mild ascites. IMPRESSION: 1. Ill-defined mixed echogenic mass within the porta hepatis measuring approximately 9.7 x 9.7 x 5.7 cm, corresponding with the metabolically avid mass noted on the previous PET CT. 2. Gallbladder sludge with wall edema/thickening (up to 5.9 mm). No focal tenderness over the gallbladder. Common bile duct measures 3 mm. 3. 12 mm right hepatic lobe cyst. 4. Mild ascites. Electronically signed by: Evalene Coho MD 05/03/2024 09:25 AM EST RP Workstation: [id number]   NM PET Image Initial (PI) Skull Base To Thigh Result Date: 05/03/2024 EXAM: PET AND CT SKULL BASE TO MID THIGH 05/02/2024 02:09:18 PM TECHNIQUE: RADIOPHARMACEUTICAL: 10.6 mCi F-18 FDG Uptake time 60 minutes. Glucose level 125 mg/dl. PET imaging was acquired from the base of the skull to the mid thighs. Non-contrast enhanced computed tomography was obtained for attenuation correction and anatomic localization. COMPARISON: CT chest, abdomen and pelvis from 04/05/2024 and 04/04/2024. CLINICAL HISTORY: Abdominal Mass. FINDINGS: HEAD AND NECK: Right orbit lobe prosthesis. Multiple tracer avid left cervical lymph nodes. Index node measures 6 mm with SUV max of 3.4. Tracer avid left supraclavicular lymph node measures 1.2 cm and has an SUV max of 7.0. CHEST: Tracer avid right paratracheal lymph node measures 7 mm with SUV max of 3.4, image 48. Within the posteromediastinum in the lower chest, there are multiple tracer-avid mediastinal lymph nodes. Along the left side of the distal descending thoracic aorta, a lymph node measures 1.1 cm with an SUV max of 11.0, image 75. Posterior to the left atrium, there is a lymph node which measures 1.3 cm with an SUV max of 7.6, axial image 64. Small bilateral pleural effusions identified. No tracer avid pulmonary nodule or mass identified. Aortic atherosclerosis. Coronary artery calcifications. Amorphous infiltrative soft tissue with Increased tracer uptake noted within the right and  left cardiophrenic fat noted. Within the right cardiophrenic fat pad lymph node measures 1.7 cm with SUV max of 4.2, axial image 70. ABDOMEN  AND PELVIS: Extensive tracer avid tumor is identified within the abdomen and pelvis with tracer-avid tumor involving the mesentery and retroperitoneum, and peritoneum. This includes: -soft tissue mass within the central mesentery measures 15.9 x 7.3 cm with SUV max of 13.6, axial image 102. -Infiltrative tumor within the ventral abdomen measures 10.0 x 4.3 cm with SUV max of 12.2, axial image 112. -Left retroperitoneal lymph node measures 2.2 cm with SUV max of 15.3, axial image 104. Right retroperitoneal tumor extends into the right perinephric space with SUV max measuring 11.9, axial image 108. -Infiltrative mass within the porta hepatic region extends around the gallbladder and along the falciform ligament with possible invasion into the umbilical vein. This measures 11.0 x 8.5 cm with SUV max of 12.9, image 83. -lymph node within the left inguinal region measures 1.1 cm with an SUV max of 6.0. - small to moderate volume abdominal pelvic ascites. Normal size spleen. No focal areas of increased uptake identified within the splenic parenchyma. The sigmoid colon demonstrates diverticulosis without evidence of diverticulitis. No bowel wall thickening, pericolonic stranding, abscess, or free air is noted. Physiologic activity within the gastrointestinal and genitourinary systems. BONES AND SOFT TISSUE: Multifocal tracer avid bone lesions are identified. The index lesion within the left iliac bone measures 1.7 cm with an SUV max of 12.2, axial image 125. Index lesion within the proximal left humerus measures 1.4 cm with SUV max of 6.7, axial image 43. The index lesion within the right side of the T3 vertebral body measures 1.3 cm with SUV max of 7.8. IMPRESSION: 1. Extensive tracer-avid tumor is identified within the neck, chest, abdomen, and pelvis. Imaging findings are  compatible with biopsy proven lymphoma. 2. Tumor within the abdomen and pelvis involves the peritoneal, mesenteric, and retroperitoneal spaces with bulky mesenteric adenopathy. Tumor within the porta hepatic region invades the falciform ligament with invasion of the umbilical vein. 3. 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 Electronically signed by: Waddell Calk MD 05/03/2024 06:11 AM EST RP Workstation: [id number]   US  Paracentesis Result Date: 05/01/2024 INDICATION: 89 year old male. History of lymphoma with recurrent malignant ascites. Request is for therapeutic paracentesis EXAM: ULTRASOUND GUIDED THERAPEUTIC RIGHT-SIDED PARACENTESIS MEDICATIONS: Lidocaine  1% 10 mL COMPLICATIONS: None immediate. PROCEDURE: Informed written consent was obtained from the patient after a discussion of the risks, benefits and alternatives to treatment. A timeout was performed prior to the initiation of the procedure. Initial ultrasound scanning demonstrates a moderate amount of ascites within the right lower abdominal quadrant. The right lower abdomen was prepped and draped in the usual sterile fashion. 1% lidocaine  was used for local anesthesia. Following this, a 19 gauge, 7-cm, Yueh catheter was introduced. An ultrasound image was saved for documentation purposes. The paracentesis was performed. The catheter was removed and a dressing was applied. The patient tolerated the procedure well without immediate post procedural complication. FINDINGS: A total of approximately 4 L of straw-colored fluid was removed. IMPRESSION: Successful ultrasound-guided therapy paracentesis yielding 4 liters of straw-colored peritoneal fluid. Performed by Delon Beagle NP Electronically Signed   By: Wilkie Lent M.D.   On: 05/01/2024 13:01   US  Paracentesis Result Date: 04/24/2024 INDICATION: Patient with recently diagnosed lymphoma, recurrent malignant ascites. Request for therapeutic  paracentesis. EXAM: ULTRASOUND GUIDED THERAPEUTIC PARACENTESIS MEDICATIONS: 5 mL 1% lidocaine  COMPLICATIONS: None immediate. PROCEDURE: Informed written consent was obtained from the patient after a discussion of the risks, benefits and alternatives to treatment. A timeout was performed prior to the initiation of the procedure. Initial  ultrasound scanning demonstrates a large amount of ascites within the right lower abdominal quadrant. The right lower abdomen was prepped and draped in the usual sterile fashion. 1% lidocaine  was used for local anesthesia. Following this, a 19 gauge, 7-cm, Yueh catheter was introduced. An ultrasound image was saved for documentation purposes. The paracentesis was performed. The catheter was removed and a dressing was applied. The patient tolerated the procedure well without immediate post procedural complication. FINDINGS: A total of approximately 4.0 L of serosanguineous fluid was removed. IMPRESSION: Successful ultrasound-guided paracentesis yielding 4.0 liters of peritoneal fluid. Performed by Clotilda Hesselbach, PA-C Electronically Signed   By: Ester Sides M.D.   On: 04/24/2024 16:19   US  CORE BIOPSY (LYMPH NODES) Result Date: 04/17/2024 INDICATION: Left groin lymphadenopathy. EXAM: ULTRASOUND GUIDED core needle  BIOPSY OF left inguinal region MEDICATIONS: None. ANESTHESIA/SEDATION: Fentanyl  1.5 mcg IV; Versed  50 mg IV Moderate Sedation Time:  10 The patient was continuously monitored during the procedure by the interventional radiology nurse under my direct supervision. PROCEDURE: The procedure, risks, benefits, and alternatives were explained to the patient. Questions regarding the procedure were encouraged and answered. The patient understands and consents to the procedure. The left groin was prepped with chlorhexidine  in a sterile fashion, and a sterile drape was applied covering the operative field. A sterile gown and sterile gloves were used for the procedure. Local  anesthesia was provided with 1% Lidocaine . An introducer needle was advanced through a small incision made in the mid aspect of the left groin. The needle was advanced under ultrasound guidance until the needle tip was engaged within the abnormally enlarged lymph node. The stylet was removed and the BioPince needle was then advanced through the introducer needle until the BioPince needle was engaged within the lymph node tissue. A 2.3 cm core sample was obtained. Sample was placed in saline. A second sample was obtained in similar fashion. All needles removed from the patient. Sterile dressing applied. Final image demonstrates no active hemorrhage. COMPLICATIONS: Satisfactory core needle biopsy of left inguinal region lymph node. Electronically Signed   By: Cordella Banner   On: 04/17/2024 15:24   US  Paracentesis Result Date: 04/17/2024 INDICATION: Patient with enlarging mesenteric mass concerning for lymphoma, new onset ascites. Request for diagnostic and therapeutic paracentesis. EXAM: ULTRASOUND GUIDED DIAGNOSTIC AND THERAPEUTIC PARACENTESIS MEDICATIONS: 6 mL 1% lidocaine  COMPLICATIONS: None immediate. PROCEDURE: Informed written consent was obtained from the patient after a discussion of the risks, benefits and alternatives to treatment. A timeout was performed prior to the initiation of the procedure. Initial ultrasound scanning demonstrates a moderate amount of ascites within the right lower abdominal quadrant. The right lower abdomen was prepped and draped in the usual sterile fashion. 1% lidocaine  was used for local anesthesia. Following this, a 19 gauge, 7-cm, Yueh catheter was introduced. An ultrasound image was saved for documentation purposes. The paracentesis was performed. The catheter was removed and a dressing was applied. The patient tolerated the procedure well without immediate post procedural complication. FINDINGS: A total of approximately 2.2 L of bloody fluid was removed. Samples were sent  to the laboratory as requested by the clinical team. IMPRESSION: Successful ultrasound-guided paracentesis yielding 2.2 liters of peritoneal fluid. Performed by Clotilda Hesselbach, PA-C Electronically Signed   By: CHRISTELLA.  Shick M.D.   On: 04/17/2024 11:48   CT ABDOMEN PELVIS WO CONTRAST Result Date: 04/16/2024 EXAM: CT ABDOMEN AND PELVIS WITHOUT CONTRAST 04/15/2024 10:17:55 PM TECHNIQUE: CT of the abdomen and pelvis was performed without the administration of  intravenous contrast. Multiplanar reformatted images are provided for review. Automated exposure control, iterative reconstruction, and/or weight-based adjustment of the mA/kV was utilized to reduce the radiation dose to as low as reasonably achievable. COMPARISON: Comparison with the 04/04/2024 CLINICAL HISTORY: Abdominal pain, acute, nonlocalized. FINDINGS: LOWER CHEST: Trace bilateral pleural effusions. LIVER: The liver is unremarkable. GALLBLADDER AND BILE DUCTS: Similar reactive gallbladder wall thickening. No biliary ductal dilatation. SPLEEN: No acute abnormality. PANCREAS: Unchanged ill-defined soft tissue about the pancreatic head. This was better assessed on prior CT with contrast. ADRENAL GLANDS: No acute abnormality. KIDNEYS, URETERS AND BLADDER: No stones in the kidneys or ureters. No hydronephrosis. No perinephric or periureteral stranding. Foley catheter in the decompressed bladder. GI AND BOWEL: Oral contrast is present within the stomach and small bowel. There is dilute contrast in the colon. Wall thickening about the terminal ileum and ascending and transverse colon is grossly similar to prior given differences in contrast administration. There is no bowel obstruction. PERITONEUM AND RETROPERITONEUM: Increased small to moderate abdominal pelvic ascites. Diffuse mesenteric stranding is similar. Redemonstrated ill-defined soft tissue density throughout the base of the mesentery. This was better evaluated with CT with IV contrast on 04/04/2024 but  is grossly similar. No free air. VASCULATURE: Aorta is normal in caliber. Aortic atherosclerotic calcification. LYMPH NODES: Unchanged left periaortic lymphadenopathy measuring up to 1.7 cm on series 2 image 45. Similar enlargement of a left inguinal lymph node measuring 1.3 cm. Similar enlargement of a pericardial lymph node measuring 1.8 cm on series 2 image 14. REPRODUCTIVE ORGANS: No acute abnormality. BONES AND SOFT TISSUES: No acute osseous abnormality. No focal soft tissue abnormality. IMPRESSION: 1. Grossly similar ill-defined soft tissue throughout the base of the mesentery. 2. Increased small to moderate abdominopelvic ascites since 11 / 12 / 25 . 3. Wall thickening about the terminal ileum and ascending and transverse colon, grossly similar to prior given differences in contrast administration. 4. Unchanged left periaortic, pericardiac, and left inguinal lymphadenopathy. Electronically signed by: Norman Gatlin MD 04/16/2024 02:38 AM EST RP Workstation: [id number]   US  RENAL Result Date: 04/14/2024 CLINICAL DATA:  Acute kidney injury EXAM: RENAL / URINARY TRACT ULTRASOUND COMPLETE COMPARISON:  CT abdomen pelvis dated 04/04/2024 FINDINGS: Right Kidney: Length = 11.5 cm Normal parenchymal echogenicity with preserved corticomedullary differentiation. No urinary tract dilation or shadowing calculi. The ureter is not seen. Left Kidney: Length = 11.1 cm Normal parenchymal echogenicity with preserved corticomedullary differentiation. No urinary tract dilation or shadowing calculi. The ureter is not seen. Bladder: Appears normal for degree of bladder distention. Other: Partially imaged small volume ascites. IMPRESSION: 1. No urinary tract dilation or shadowing calculi. Normal cortical echogenicity. 2. Partially imaged small volume ascites. Electronically Signed   By: Limin  Xu M.D.   On: 04/14/2024 15:42   DG Abd 2 Views Result Date: 04/12/2024 CLINICAL DATA:  Abdominal distention EXAM: ABDOMEN - 2 VIEW  COMPARISON:  Abdominal radiograph dated 04/04/2024 FINDINGS: Nonobstructive bowel gas pattern. Relative paucity of bowel gas within the right lower quadrant and central abdomen. No free air or pneumatosis. No abnormal radio-opaque calculi or mass effect. No acute or substantial osseous abnormality. The sacrum and coccyx are partially obscured by overlying bowel contents. IMPRESSION: Nonobstructive bowel gas pattern. Relative paucity of bowel gas within the right lower quadrant and central abdomen may reflect underdistended/compressed or fluid-filled bowel loops. Electronically Signed   By: Limin  Xu M.D.   On: 04/12/2024 16:21   US  ASCITES (ABDOMEN LIMITED) Result Date: 04/11/2024 CLINICAL DATA:  Abdominal distension.  Assess for ascites. EXAM: LIMITED ABDOMEN ULTRASOUND FOR ASCITES TECHNIQUE: Limited ultrasound survey for ascites was performed in all four abdominal quadrants. COMPARISON:  CT, 04/04/2024. FINDINGS: Small amount of ascites, most evident in the lower quadrants. This overall appears increased compared to the prior CT. IMPRESSION: 1. Small amount of ascites. Electronically Signed   By: Alm Parkins M.D.   On: 04/11/2024 12:38    Labs:  CBC: Recent Labs    05/04/24 1656 05/05/24 0333 05/05/24 1635 05/06/24 0406  WBC 6.9 5.7 5.4 4.7  HGB 9.4* 8.9* 9.9* 8.9*  HCT 30.0* 28.7* 32.0* 29.5*  PLT 212 209 221 187    COAGS: No results for input(s): INR, APTT in the last 8760 hours.  BMP: Recent Labs    05/04/24 1656 05/05/24 0333 05/05/24 1635 05/06/24 0406  NA 143 142 142 143  K 3.8 3.7 3.8 3.8  CL 104 104 104 107  CO2 25 25 24 25   GLUCOSE 134* 107* 138* 118*  BUN 42* 38* 34* 31*  CALCIUM  7.5* 7.3* 7.5* 7.6*  CREATININE 1.31* 1.26* 1.12 0.95  GFRNONAA 52* 55* >60 >60    LIVER FUNCTION TESTS: Recent Labs    05/04/24 1656 05/05/24 0333 05/05/24 1635 05/06/24 0406  BILITOT 1.9* 1.2 0.9 0.8  AST 215* 151* 86* 56*  ALT 205* 183* 158* 121*  ALKPHOS 1,612*  1,492* 1,413* 1,129*  PROT 4.5* 4.3* 4.7* 4.4*  ALBUMIN  2.6* 2.4* 2.6* 2.5*    TUMOR MARKERS: No results for input(s): AFPTM, CEA, CA199, CHROMGRNA in the last 8760 hours.  Assessment and Plan:  89 y.o male. History of CKD, HTN, HLD, DM. 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 Team is concerned for a more aggressive type of lymphoma. Initial request for mesenteric biopsy. After review of PET scan from 12.19.25 IR Attending Dr. Thom Hall recommends retroperitoneal lymph node biopsy. This was communicated to the Team who is in agreement with the biopsy.   PLAN: IR Image Guided Retroperitoneal Lymph Node Biopsy   IR consulted for possible retroperitoneal lymph node biopsy. . Case has been reviewed and procedure approved by Dr. Thom Hall.  Patient tentatively scheduled for 12.23.25.  Team instructed to: Keep Patient to be NPO after midnight Hold prophylactic anticoagulation 24 hours prior to scheduled procedure. Coordinate transport to [facility] via carelink for 8:30am procedure time. Patient to be returned to [facility] post procedure.   Risks and benefits of retroperitoneal lymph node was discussed with the patient and/or patient's family including, but not limited to bleeding, infection, damage to adjacent structures or low yield requiring additional tests.  All of the questions were answered and there is agreement to proceed.  Consent signed and in chart.   Thank you for this interesting consult.  I greatly enjoyed meeting GILLIS BOARDLEY and look forward to participating in their care.  A copy of this report was sent to the requesting provider on this date.  Electronically Signed: Delon JAYSON Beagle, NP 05/06/2024, 3:26 PM   I spent a total of 40 Minutes    in face to face in clinical consultation, greater than 50% of which was counseling/coordinating care for retroperitoneal lymph node biopsy.   "

## 2024-05-07 ENCOUNTER — Ambulatory Visit (HOSPITAL_COMMUNITY): Attending: Internal Medicine

## 2024-05-07 ENCOUNTER — Other Ambulatory Visit: Payer: Self-pay | Admitting: Hematology

## 2024-05-07 DIAGNOSIS — C8295 Follicular lymphoma, unspecified, lymph nodes of inguinal region and lower limb: Secondary | ICD-10-CM | POA: Insufficient documentation

## 2024-05-07 DIAGNOSIS — C8590 Non-Hodgkin lymphoma, unspecified, unspecified site: Secondary | ICD-10-CM | POA: Diagnosis not present

## 2024-05-07 DIAGNOSIS — I10 Essential (primary) hypertension: Secondary | ICD-10-CM | POA: Diagnosis not present

## 2024-05-07 DIAGNOSIS — C8299 Follicular lymphoma, unspecified, extranodal and solid organ sites: Secondary | ICD-10-CM | POA: Diagnosis not present

## 2024-05-07 DIAGNOSIS — N1831 Chronic kidney disease, stage 3a: Secondary | ICD-10-CM | POA: Diagnosis not present

## 2024-05-07 LAB — CBC
HCT: 27.6 % — ABNORMAL LOW (ref 39.0–52.0)
HCT: 31 % — ABNORMAL LOW (ref 39.0–52.0)
Hemoglobin: 8.7 g/dL — ABNORMAL LOW (ref 13.0–17.0)
Hemoglobin: 9.4 g/dL — ABNORMAL LOW (ref 13.0–17.0)
MCH: 28.2 pg (ref 26.0–34.0)
MCH: 27.6 pg (ref 26.0–34.0)
MCHC: 31.5 g/dL (ref 30.0–36.0)
MCHC: 30.3 g/dL (ref 30.0–36.0)
MCV: 89.6 fL (ref 80.0–100.0)
MCV: 91.2 fL (ref 80.0–100.0)
Platelets: 199 K/uL (ref 150–400)
Platelets: 224 K/uL (ref 150–400)
RBC: 3.08 MIL/uL — ABNORMAL LOW (ref 4.22–5.81)
RBC: 3.4 MIL/uL — ABNORMAL LOW (ref 4.22–5.81)
RDW: 20.3 % — ABNORMAL HIGH (ref 11.5–15.5)
RDW: 20.1 % — ABNORMAL HIGH (ref 11.5–15.5)
WBC: 5.1 K/uL (ref 4.0–10.5)
WBC: 6.1 K/uL (ref 4.0–10.5)
nRBC: 0 % (ref 0.0–0.2)
nRBC: 0 % (ref 0.0–0.2)

## 2024-05-07 LAB — URIC ACID
Uric Acid, Serum: 6.6 mg/dL (ref 3.7–8.6)
Uric Acid, Serum: 5.4 mg/dL (ref 3.7–8.6)

## 2024-05-07 LAB — COMPREHENSIVE METABOLIC PANEL WITH GFR
ALT: 81 U/L — ABNORMAL HIGH (ref 0–44)
AST: 34 U/L (ref 15–41)
Albumin: 2.7 g/dL — ABNORMAL LOW (ref 3.5–5.0)
Alkaline Phosphatase: 815 U/L — ABNORMAL HIGH (ref 38–126)
Anion gap: 10 (ref 5–15)
BUN: 26 mg/dL — ABNORMAL HIGH (ref 8–23)
CO2: 25 mmol/L (ref 22–32)
Calcium: 7.4 mg/dL — ABNORMAL LOW (ref 8.9–10.3)
Chloride: 107 mmol/L (ref 98–111)
Creatinine, Ser: 0.82 mg/dL (ref 0.61–1.24)
GFR, Estimated: >60 mL/min
Glucose, Bld: 119 mg/dL — ABNORMAL HIGH (ref 70–99)
Potassium: 3.9 mmol/L (ref 3.5–5.1)
Sodium: 143 mmol/L (ref 135–145)
Total Bilirubin: 0.7 mg/dL (ref 0.0–1.2)
Total Protein: 4.4 g/dL — ABNORMAL LOW (ref 6.5–8.1)

## 2024-05-07 LAB — LACTATE DEHYDROGENASE
LDH: 706 U/L — ABNORMAL HIGH (ref 105–235)
LDH: 687 U/L — ABNORMAL HIGH (ref 105–235)

## 2024-05-07 LAB — GLUCOSE, CAPILLARY
Glucose-Capillary: 136 mg/dL — ABNORMAL HIGH (ref 70–99)
Glucose-Capillary: 166 mg/dL — ABNORMAL HIGH (ref 70–99)
Glucose-Capillary: 115 mg/dL — ABNORMAL HIGH (ref 70–99)
Glucose-Capillary: 115 mg/dL — ABNORMAL HIGH (ref 70–99)

## 2024-05-07 LAB — PROTIME-INR
INR: 1 (ref 0.8–1.2)
Prothrombin Time: 13.7 s (ref 11.4–15.2)

## 2024-05-07 LAB — PHOSPHORUS
Phosphorus: 3.2 mg/dL (ref 2.5–4.6)
Phosphorus: 3.2 mg/dL (ref 2.5–4.6)

## 2024-05-07 MED ORDER — MIDAZOLAM HCL 2 MG/2ML IJ SOLN
INTRAMUSCULAR | Status: AC
  Filled 2024-05-07: qty 4

## 2024-05-07 MED ORDER — LIDOCAINE 1 % OPTIME INJ - NO CHARGE
10.0000 mL | Freq: Once | INTRAMUSCULAR | Status: AC
  Administered 2024-05-07: 10 mL via INTRADERMAL

## 2024-05-07 MED ORDER — FENTANYL CITRATE (PF) 100 MCG/2ML IJ SOLN
INTRAMUSCULAR | Status: AC | PRN
  Administered 2024-05-07: 25 ug via INTRAVENOUS
  Administered 2024-05-07: 50 ug via INTRAVENOUS

## 2024-05-07 MED ORDER — MIDAZOLAM HCL (PF) 2 MG/2ML IJ SOLN
INTRAMUSCULAR | Status: AC | PRN
  Administered 2024-05-07: .5 mg via INTRAVENOUS
  Administered 2024-05-07: 1 mg via INTRAVENOUS

## 2024-05-07 MED ORDER — FENTANYL CITRATE (PF) 100 MCG/2ML IJ SOLN
INTRAMUSCULAR | Status: AC
  Filled 2024-05-07: qty 4

## 2024-05-07 MED ORDER — SODIUM CHLORIDE 0.9 % IV SOLN
2.0000 g | INTRAVENOUS | Status: AC
  Administered 2024-05-07: 2 g via INTRAVENOUS
  Filled 2024-05-07: qty 20

## 2024-05-07 NOTE — Care Management Important Message (Signed)
 Important Message  Patient Details  Name: Cody Palmer MRN: [id number] Date of Birth: 11/01/1932   Important Message Given:  Yes - Medicare IM     Daequan Kozma L Isidro Monks 05/07/2024, 2:35 PM

## 2024-05-07 NOTE — Plan of Care (Signed)
   Problem: Activity: Goal: Risk for activity intolerance will decrease Outcome: Progressing   Problem: Coping: Goal: Level of anxiety will decrease Outcome: Progressing

## 2024-05-07 NOTE — Procedures (Signed)
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 Postprocedure diagnosis: same EBL:    minimal Complications:   none immediate  See full dictation in [employer].  CHARM Toribio Faes MD Main # [phone] Pager  [phone] Mobile [phone]    "

## 2024-05-07 NOTE — Progress Notes (Signed)
 " PROGRESS NOTE    Cody Palmer  FMW:[id number] DOB: 27-Jan-1933 DOA: 05/02/2024 PCP: [name], NP   Brief Narrative:  As per H&P written by Dr. Sherlon on 05/02/2024 Cody Palmer  is a 89 y.o. male, with stage IIIa CKD, hypertension, hyperlipidemia, type 2 diabetes mellitus, controlled, with recent hospitalization due to AKI, workup significant for non-Hodgkin's lymphoma. - Patient started on antibiotic during recent hospitalization for SBP, and recurrent ascites, fluid biopsy was significant for none Hodgkin's lymphoma, he had paracentesis yesterday with 4 L drained, follow-up with the department this morning, where they recommend initiation of  immunotherapy given he is high risk for tumor lysis syndrome admission has been recommended. - Labs and workup at oncology clinic today was elevated liver enzymes, creatinine at baseline, potassium low at 3.3, he had paracentesis yesterday done with 4 L drained, does not appear there were any labs sent.  Have discussed with oncology, who will initiate rituximab  immunotherapy tomorrow at 8:30 a.m., premedications and rituximab  although will be taking care of by oncology .  Assessment & Plan: 1-non-Hodgkin lymphoma - Patient has been started on reducing IV infusion - High risk for TLS - Continue to follow oncology service recommendation - Maintain adequate hydration - Follow CMP, CBC, uric acid, phosphorus and LDH.  Per oncology service transition to once a day only; goal numbers continue trending down adequately. - Steroids will be discontinue after today's dosage. - Patient is n.p.o. and ready for planned IR guided biopsy of the retroperitoneal/para-aortic lymph node in order to assist ruling out DLBCL. - Continue to maintain adequate hydration. - Patient is no longer demonstrating any jaundice and his LFTs has continue adequately improving. - After lymph node biopsy done patient will be able to go back to skilled nursing  facility for rehabilitation. - Oncology service will arrange outpatient follow-up for second rituximab  infusion.  2-transaminitis/recurring ascites and hyperbilirubinemia - Continue to follow LFTs trend - Most likely associated with obstruction/hepatic stasis due to ongoing lymphoma -LFTs trending down.  Continue to monitor levels intermittently. - Status post paracentesis on 05/01/2024 - Status post paracentesis; 3 L successfully removed. - Albumin  will be provided. - Continue treatment with ceftriaxone  for SBP prophylaxis. - Continue minimizing hepatotoxic agents - Continue to maintain adequate hydration - Continue to follow clinical response.  3-chronic kidney disease stage IIIb - Appears to be stable and currently at baseline - Continue to follow renal function trend. - Excellent response to fluid and Lasix ; renal function at the best level in a while after intervention. - Continue to follow trend intermittently. -Cr 0.82 currently  4-essential hypertension - Blood pressure stable - Follow-up vital sign - Holding antihypertensive agents to minimize the chance of hypotension.  5-type 2 diabetes - Sliding scale insulin  has been started - Hold oral hypoglycemic agent - Follow CBG fluctuation.  6-hypokalemia - Continue to follow ultralights and further replete as needed. - Continue telemetry monitoring.   DVT prophylaxis: Heparin  Code Status: DNR/DNI. Family Communication: No family at bedside. Disposition:   Status is: Inpatient Remains inpatient appropriate because: Continue IV therapy.   Consultants:  Hematology oncology  Procedures:  See below for x-ray reports.  Antimicrobials:  Rocephin    Subjective: Appears to be hungry; no chest pain, no nausea, no vomiting.  Good saturation on room air.  Waiting for anticipated lymph node biopsy later today.  Objective: Vitals:   05/07/24 0950 05/07/24 0955 05/07/24 1000 05/07/24 1015  BP: 124/73 124/65 129/63 (!)  115/55  Pulse: 93 93  94 87  Resp: 20 18 20 20   Temp:      TempSrc:      SpO2: 95% 94% 94% 93%    Intake/Output Summary (Last 24 hours) at 05/07/2024 1228 Last data filed at 05/07/2024 0342 Gross per 24 hour  Intake 240 ml  Output 1350 ml  Net -1110 ml   There were no vitals filed for this visit.  Examination: General exam: Alert, awake, oriented x 3; in no acute distress.  No overnight events. Respiratory system: Good saturation on room air. Cardiovascular system: Rate controlled, no rubs, no gallops, no JVD. Gastrointestinal system: Abdomen is soft, nontender and less distended on today's exam.  Positive bowel sounds appreciated. Central nervous system: No focal neurological deficits. Extremities: No cyanosis or clubbing; trace edema appreciated bilaterally. Skin: No petechiae. Psychiatry: Judgement and insight appear normal. Mood & affect appropriate.   Data Reviewed: I have personally reviewed following labs and imaging studies  CBC: Recent Labs  Lab 05/02/24 1349 05/03/24 0458 05/05/24 0333 05/05/24 1635 05/06/24 0406 05/06/24 1649 05/07/24 0436  WBC 9.3   < > 5.7 5.4 4.7 5.3 5.1  NEUTROABS 5.8  --   --   --   --   --   --   HGB 10.7*   < > 8.9* 9.9* 8.9* 9.3* 8.7*  HCT 33.4*   < > 28.7* 32.0* 29.5* 29.7* 27.6*  MCV 86.8   < > 87.8 89.1 88.9 88.9 89.6  PLT 261   < > 209 221 187 194 199   < > = values in this interval not displayed.    Basic Metabolic Panel: Recent Labs  Lab 05/02/24 1349 05/02/24 1630 05/05/24 0333 05/05/24 1635 05/06/24 0406 05/06/24 1649 05/07/24 0436  NA  --    < > 142 142 143 142 143  K  --    < > 3.7 3.8 3.8 4.0 3.9  CL  --    < > 104 104 107 107 107  CO2  --    < > 25 24 25 24 25   GLUCOSE  --    < > 107* 138* 118* 139* 119*  BUN  --    < > 38* 34* 31* 30* 26*  CREATININE  --    < > 1.26* 1.12 0.95 0.91 0.82  CALCIUM   --    < > 7.3* 7.5* 7.6* 7.5* 7.4*  MG 2.0  --   --   --   --   --   --   PHOS 3.8   < > 4.8* 4.5 4.4 3.6  3.2   < > = values in this interval not displayed.    GFR: Estimated Creatinine Clearance: 68.3 mL/min (by C-G formula based on SCr of 0.82 mg/dL).  Liver Function Tests: Recent Labs  Lab 05/05/24 0333 05/05/24 1635 05/06/24 0406 05/06/24 1649 05/07/24 0436  AST 151* 86* 56* 41 34  ALT 183* 158* 121* 107* 81*  ALKPHOS 1,492* 1,413* 1,129* 1,033* 815*  BILITOT 1.2 0.9 0.8 0.7 0.7  PROT 4.3* 4.7* 4.4* 4.6* 4.4*  ALBUMIN  2.4* 2.6* 2.5* 2.6* 2.7*    CBG: Recent Labs  Lab 05/06/24 0726 05/06/24 1110 05/06/24 1618 05/06/24 1954 05/07/24 0726  GLUCAP 110* 140* 136* 166* 115*     Recent Results (from the past 240 hours)  MRSA Next Gen by PCR, Nasal     Status: None   Collection Time: 05/02/24  3:30 PM   Specimen: Nasal Mucosa; Nasal Swab  Result Value  Ref Range Status   MRSA by PCR Next Gen NOT DETECTED NOT DETECTED Final    Comment: (NOTE) The GeneXpert MRSA Assay (FDA approved for NASAL specimens only), is one component of a comprehensive MRSA colonization surveillance program. It is not intended to diagnose MRSA infection nor to guide or monitor treatment for MRSA infections. Test performance is not FDA approved in patients less than 21 years old. Performed at [facility], [address] [place], [place] [address]      Radiology Studies: US  Paracentesis Result Date: 05/06/2024 INDICATION: [id number] Ascites 387 89 year old male. History of lymphoma with recurrent malignant ascites. Request is for therapeutic paracentesis EXAM: ULTRASOUND GUIDED THERAPEUTIC PARACENTESIS MEDICATIONS: Lidocaine  1% 10 mL COMPLICATIONS: None immediate. PROCEDURE: Informed written consent was obtained from the patient after a discussion of the risks, benefits and alternatives to treatment. A timeout was performed prior to the initiation of the procedure. Initial ultrasound scanning demonstrates a moderate amount of ascites within the right lower abdominal quadrant. The right lower abdomen  was prepped and draped in the usual sterile fashion. 1% lidocaine  was used for local anesthesia. Following this, a 19 gauge, 7-cm, Yueh catheter was introduced. An ultrasound image was saved for documentation purposes. The paracentesis was performed. The catheter was removed and a dressing was applied. The patient tolerated the procedure well without immediate post procedural complication. FINDINGS: A total of approximately 3 L of straw-colored fluid was removed. IMPRESSION: Successful ultrasound-guided therapeutic paracentesis yielding 3.0 L of peritoneal fluid. Performed by Delon Beagle NP under supervision of Thom Hall, MD Electronically Signed   By: Thom Hall M.D.   On: 05/06/2024 10:39     Scheduled Meds:  allopurinol   150 mg Oral Daily   Chlorhexidine  Gluconate Cloth  6 each Topical Q0600   heparin   5,000 Units Subcutaneous Q8H   insulin  aspart  0-5 Units Subcutaneous QHS   insulin  aspart  0-9 Units Subcutaneous TID WC   pantoprazole   40 mg Oral Daily   Continuous Infusions:  sodium chloride  Stopped (05/03/24 1507)   sodium chloride  Stopped (05/03/24 1105)   cefTRIAXone  (ROCEPHIN )  IV       LOS: 5 days    Time spent: 50 minutes    Eric Nunnery, MD [facility]   To contact the attending provider between 7A-7P or the covering provider during after hours 7P-7A, please log into the web site [email or web address] and access using universal [place] password for that web site. If you do not have the password, please call the hospital operator.  05/07/2024, 12:28 PM    "

## 2024-05-07 NOTE — Progress Notes (Signed)
 Mobility Specialist Progress Note:    05/07/24 1512  Mobility  Activity Ambulated with assistance  Level of Assistance Minimal assist, patient does 75% or more  Assistive Device Front wheel walker  Distance Ambulated (ft) 130 ft  Range of Motion/Exercises Active;All extremities  Activity Response Tolerated well  Mobility Referral Yes  Mobility visit 1 Mobility  Mobility Specialist Start Time (ACUTE ONLY) 1512  Mobility Specialist Stop Time (ACUTE ONLY) 1540  Mobility Specialist Time Calculation (min) (ACUTE ONLY) 28 min   Pt received sitting EOB, agreeable to mobility. Required MinA to stand and supervision to ambulate with RW. Tolerated well, asx throughout. Left supine, alarm on. All needs met.  Cody Palmer Mobility Specialist Please contact via Special Educational Needs Teacher or  Rehab office at [phone]

## 2024-05-08 ENCOUNTER — Other Ambulatory Visit: Payer: Self-pay | Admitting: Oncology

## 2024-05-08 DIAGNOSIS — C8590 Non-Hodgkin lymphoma, unspecified, unspecified site: Secondary | ICD-10-CM | POA: Diagnosis not present

## 2024-05-08 DIAGNOSIS — C8299 Follicular lymphoma, unspecified, extranodal and solid organ sites: Secondary | ICD-10-CM

## 2024-05-08 LAB — GLUCOSE, CAPILLARY
Glucose-Capillary: 138 mg/dL — ABNORMAL HIGH (ref 70–99)
Glucose-Capillary: 136 mg/dL — ABNORMAL HIGH (ref 70–99)
Glucose-Capillary: 122 mg/dL — ABNORMAL HIGH (ref 70–99)

## 2024-05-08 NOTE — Progress Notes (Signed)
 " PROGRESS NOTE    Cody Palmer  FMW:[id number] DOB: [birth year] DOA: 05/02/2024 PCP: [name], NP   Brief Narrative:   89 y.o. male, with stage IIIa CKD, hypertension, hyperlipidemia, type 2 diabetes mellitus, controlled, with recent hospitalization due to AKI, workup significant for non-Hodgkin's lymphoma. - Patient started on antibiotic during recent hospitalization for SBP, and recurrent ascites, fluid biopsy was significant for non Hodgkin's lymphoma  Assessment & Plan:  Principal Problem:   Non Hodgkin's lymphoma (HCC) Active Problems:   Chronic kidney disease, stage 3a (HCC)   Essential hypertension   Hyperlipidemia   Follicular lymphoma (HCC)   GERD without esophagitis    Non-Hodgkin lymphoma - s/p Rituximab  on 12/19 - High risk for TLS - Continue to follow oncology service recommendation - Maintain adequate hydration - Follow CMP, CBC, uric acid, phosphorus and LDH.  Per oncology service transition to once a day only; goal numbers continue trending down adequately. - Steroids dced - s/p IR guided biopsy of the retroperitoneal/para-aortic lymph node done on 12/23 - Patient is no longer demonstrating any jaundice and his LFTs has continue adequately improving. - Outpatient follow up oncology for second dose of rituximab .   Transaminitis/recurring ascites and hyperbilirubinemia - Continue to follow LFTs trend - Most likely associated with obstruction/hepatic stasis due to ongoing lymphoma -LFTs trending down.  Continue to monitor levels intermittently. - Status post paracentesis on 05/01/2024 and 05/06/24  - Continue treatment with ceftriaxone  for SBP prophylaxis. - Continue minimizing hepatotoxic agents - Continue to maintain adequate hydration - Continue to follow clinical response.   Chronic kidney disease stage IIIb - Appears to be stable and currently at baseline - Continue to follow renal function trend. - Excellent response to fluid and  Lasix ; renal function at the best level in a while after intervention. - Continue to follow trend intermittently. -Cr 0.82 currently   Essential hypertension - Blood pressure stable - Follow-up vital sign - Holding antihypertensive agents to minimize the chance of hypotension.   Type 2 diabetes - Sliding scale insulin  has been started - Hold oral hypoglycemic agent - Follow CBG fluctuation.   Hypokalemia: prn repletion  Disposition: back to SNF, awaiting auth.  DVT prophylaxis: heparin  injection 5,000 Units Start: 05/03/24 0600 Maintain sequential compression device Start: 05/02/24 1613     Code Status: Limited: Do not attempt resuscitation (DNR) -DNR-LIMITED -Do Not Intubate/DNI  Family Communication:  daughter and grand daughters at bedside  Status is: Inpatient Remains inpatient appropriate because: Pending placement    Subjective:  He feels great. Family is at the bedside. Awaiting insurance auth to go back to [facility].  Examination:  General exam: Appears calm and comfortable  Respiratory system: Clear to auscultation. Respiratory effort normal. Cardiovascular system: S1 & S2 heard, RRR. No JVD, murmurs, rubs, gallops or clicks. No pedal edema. Gastrointestinal system: Abdomen is nondistended, soft and nontender. No organomegaly or masses felt. Normal bowel sounds heard. Central nervous system: Alert and oriented. No focal neurological deficits. Extremities: Symmetric 5 x 5 power. Skin: No rashes, lesions or ulcers Psychiatry: Judgement and insight appear normal. Mood & affect appropriate.     Diet Orders (From admission, onward)     Start     Ordered   05/07/24 1200  Diet heart healthy/carb modified Room service appropriate? Yes; Fluid consistency: Thin  Diet effective now       Question Answer Comment  Diet-HS Snack? Nothing   Room service appropriate? Yes   Fluid consistency: Thin  05/07/24 1200            Objective: Vitals:   05/07/24 1626  05/07/24 1848 05/07/24 2300 05/08/24 0319  BP: 131/63 112/67 122/64 139/69  Pulse: 95 (!) 109 81 92  Resp: 16 20 20 20   Temp: 98.4 F (36.9 C) 98.8 F (37.1 C) 98.6 F (37 C) 97.8 F (36.6 C)  TempSrc: Oral Oral Oral   SpO2: 94% 96% 97% 93%    Intake/Output Summary (Last 24 hours) at 05/08/2024 1020 Last data filed at 05/08/2024 0537 Gross per 24 hour  Intake 720 ml  Output 1025 ml  Net -305 ml   There were no vitals filed for this visit.  Scheduled Meds:  allopurinol   150 mg Oral Daily   Chlorhexidine  Gluconate Cloth  6 each Topical Q0600   heparin   5,000 Units Subcutaneous Q8H   insulin  aspart  0-5 Units Subcutaneous QHS   insulin  aspart  0-9 Units Subcutaneous TID WC   pantoprazole   40 mg Oral Daily   Continuous Infusions:  sodium chloride  Stopped (05/03/24 1507)   sodium chloride  Stopped (05/03/24 1105)    Nutritional status     There is no height or weight on file to calculate BMI.  Data Reviewed:   CBC: Recent Labs  Lab 05/02/24 1349 05/03/24 0458 05/05/24 1635 05/06/24 0406 05/06/24 1649 05/07/24 0436 05/07/24 1908  WBC 9.3   < > 5.4 4.7 5.3 5.1 6.1  NEUTROABS 5.8  --   --   --   --   --   --   HGB 10.7*   < > 9.9* 8.9* 9.3* 8.7* 9.4*  HCT 33.4*   < > 32.0* 29.5* 29.7* 27.6* 31.0*  MCV 86.8   < > 89.1 88.9 88.9 89.6 91.2  PLT 261   < > 221 187 194 199 224   < > = values in this interval not displayed.   Basic Metabolic Panel: Recent Labs  Lab 05/02/24 1349 05/02/24 1630 05/05/24 0333 05/05/24 1635 05/06/24 0406 05/06/24 1649 05/07/24 0436 05/07/24 1908  NA  --    < > 142 142 143 142 143  --   K  --    < > 3.7 3.8 3.8 4.0 3.9  --   CL  --    < > 104 104 107 107 107  --   CO2  --    < > 25 24 25 24 25   --   GLUCOSE  --    < > 107* 138* 118* 139* 119*  --   BUN  --    < > 38* 34* 31* 30* 26*  --   CREATININE  --    < > 1.26* 1.12 0.95 0.91 0.82  --   CALCIUM   --    < > 7.3* 7.5* 7.6* 7.5* 7.4*  --   MG 2.0  --   --   --   --   --   --    --   PHOS 3.8   < > 4.8* 4.5 4.4 3.6 3.2 3.2   < > = values in this interval not displayed.   GFR: Estimated Creatinine Clearance: 68.3 mL/min (by C-G formula based on SCr of 0.82 mg/dL). Liver Function Tests: Recent Labs  Lab 05/05/24 0333 05/05/24 1635 05/06/24 0406 05/06/24 1649 05/07/24 0436  AST 151* 86* 56* 41 34  ALT 183* 158* 121* 107* 81*  ALKPHOS 1,492* 1,413* 1,129* 1,033* 815*  BILITOT 1.2 0.9 0.8 0.7 0.7  PROT  4.3* 4.7* 4.4* 4.6* 4.4*  ALBUMIN  2.4* 2.6* 2.5* 2.6* 2.7*   No results for input(s): LIPASE, AMYLASE in the last 168 hours. Recent Labs  Lab 05/03/24 0458  AMMONIA 29   Coagulation Profile: Recent Labs  Lab 05/07/24 0436  INR 1.0   Cardiac Enzymes: No results for input(s): CKTOTAL, CKMB, CKMBINDEX, TROPONINI in the last 168 hours. BNP (last 3 results) No results for input(s): PROBNP in the last 8760 hours. HbA1C: No results for input(s): HGBA1C in the last 72 hours. CBG: Recent Labs  Lab 05/07/24 0726 05/07/24 1124 05/07/24 1623 05/07/24 2009 05/08/24 0722  GLUCAP 115* 115* 138* 136* 122*   Lipid Profile: No results for input(s): CHOL, HDL, LDLCALC, TRIG, CHOLHDL, LDLDIRECT in the last 72 hours. Thyroid Function Tests: No results for input(s): TSH, T4TOTAL, FREET4, T3FREE, THYROIDAB in the last 72 hours. Anemia Panel: No results for input(s): VITAMINB12, FOLATE, FERRITIN, TIBC, IRON, RETICCTPCT in the last 72 hours. Sepsis Labs: No results for input(s): PROCALCITON, LATICACIDVEN in the last 168 hours.  Recent Results (from the past 240 hours)  MRSA Next Gen by PCR, Nasal     Status: None   Collection Time: 05/02/24  3:30 PM   Specimen: Nasal Mucosa; Nasal Swab  Result Value Ref Range Status   MRSA by PCR Next Gen NOT DETECTED NOT DETECTED Final    Comment: (NOTE) The GeneXpert MRSA Assay (FDA approved for NASAL specimens only), is one component of a comprehensive MRSA  colonization surveillance program. It is not intended to diagnose MRSA infection nor to guide or monitor treatment for MRSA infections. Test performance is not FDA approved in patients less than 32 years old. Performed at [facility], [address], [place], [place] [address]          Radiology Studies: CT BIOPSY Result Date: 05/07/2024 EXAM: CT-guided core biopsy left paraaortic retroperitoneal adenopathy COMPARISON: PET CT 05/02/2024 CLINICAL HISTORY: Lymphadenopathy Previous biopsy of same target: No Complications: No immediate complications. Conscious Sedation: 1.5 mg Versed  IV, 75 mcg fentanyl  IV. Sedation Technique: Under physician supervision, Versed  and fentanyl  were administered IV for moderate sedation. Pulse oximetry, heart rate, and blood pressure were continuously monitored with an independent trained observer present. Physician face-to-face sedation time: 12 minutes. All CT scans at this facility use dose modulation, iterative reconstruction, and/or size based dosing when appropriate to reduce radiation dose to as low as reasonably achievable. Discussion: Time out performed including verification of patient, date of birth, procedure, and sidedness as appropriate. Informed written consent was obtained. The site was prepared and draped using maximal sterile barrier technique including cutaneous antisepsis. The patient was positioned Prone. Initial imaging was performed. Biopsy target: Organ or target location: Retroperitoneal adenopathy, left paraaortic. Laterality: Left. Maximal diameter: 2.8 cm. Other findings: None. Local anesthesia was administered. Under CT guidance, the 17 gauge core biopsy needle was advanced from a posterior approach to the margin of the lesion. Coaxial 18 gauge core biopsy samples were obtained, submitted and in saline to surgical pathology. Biopsy details: Biopsy type: Core Coaxial needle: 17G Core needle device: Biopince Core needle size: 18G Number of core  specimens: Multiple core specimens obtained. Fine needle aspiration performed: No. FNA needle size: N/A Number of FNA specimens: N/A On-site assessment of biopsy adequacy: No. Preliminary assessment of sample adequacy: N/A The biopsy needle was removed and a sterile dressing was applied. Tract closure: None Post-procedure imaging: Immediate post-biopsy imaging: Noncontrast CT Post-biopsy imaging findings: No hemorrhage or other apparent complication. Contrast: Contrast agent: None.  Contrast volume: 0 mL. Estimated blood loss: Less than 10 mL. Specimens sent for evaluation. IMPRESSION: 1. Successful core biopsy of left paraaortic adenopathy. 2. No immediate complications. Electronically signed by: Katheleen Faes MD 05/07/2024 03:06 PM EST RP Workstation: [id number]   US  Paracentesis Result Date: 05/06/2024 INDICATION: [id number] Ascites 2577 89 year old male. History of lymphoma with recurrent malignant ascites. Request is for therapeutic paracentesis EXAM: ULTRASOUND GUIDED THERAPEUTIC PARACENTESIS MEDICATIONS: Lidocaine  1% 10 mL COMPLICATIONS: None immediate. PROCEDURE: Informed written consent was obtained from the patient after a discussion of the risks, benefits and alternatives to treatment. A timeout was performed prior to the initiation of the procedure. Initial ultrasound scanning demonstrates a moderate amount of ascites within the right lower abdominal quadrant. The right lower abdomen was prepped and draped in the usual sterile fashion. 1% lidocaine  was used for local anesthesia. Following this, a 19 gauge, 7-cm, Yueh catheter was introduced. An ultrasound image was saved for documentation purposes. The paracentesis was performed. The catheter was removed and a dressing was applied. The patient tolerated the procedure well without immediate post procedural complication. FINDINGS: A total of approximately 3 L of straw-colored fluid was removed. IMPRESSION: Successful ultrasound-guided therapeutic  paracentesis yielding 3.0 L of peritoneal fluid. Performed by Delon Beagle NP under supervision of Thom Hall, MD Electronically Signed   By: Thom Hall M.D.   On: 05/06/2024 10:39           LOS: 6 days   Time spent= 35 mins    Deliliah Room, MD [facility]  If 7PM-7AM, please contact night-coverage  05/08/2024, 10:20 AM  "

## 2024-05-08 NOTE — Progress Notes (Signed)
 Mobility Specialist Progress Note:    05/08/24 1448  Mobility  Activity Ambulated with assistance  Level of Assistance Minimal assist, patient does 75% or more  Assistive Device Front wheel walker  Distance Ambulated (ft) 8 ft  Range of Motion/Exercises Active;All extremities  Activity Response Tolerated well  Mobility Referral Yes  Mobility visit 1 Mobility  Mobility Specialist Start Time (ACUTE ONLY) 1448  Mobility Specialist Stop Time (ACUTE ONLY) 1502  Mobility Specialist Time Calculation (min) (ACUTE ONLY) 14 min   Pt received in bathroom, requesting assistance back to bed. Required MinA to stand and CGA to ambulate with RW. Tolerated well, asx throughout. Left supine, all needs met.  Cody Palmer Mobility Specialist Please contact via Special Educational Needs Teacher or  Rehab office at [phone]

## 2024-05-08 NOTE — Progress Notes (Signed)
 Mobility Specialist Progress Note:    05/08/24 0900  Mobility  Activity Ambulated with assistance  Level of Assistance Minimal assist, patient does 75% or more  Assistive Device Front wheel walker  Distance Ambulated (ft) 35 ft  Range of Motion/Exercises Active;All extremities  Activity Response Tolerated well  Mobility Referral Yes  Mobility visit 1 Mobility  Mobility Specialist Start Time (ACUTE ONLY) 0900  Mobility Specialist Stop Time (ACUTE ONLY) [id number]  Mobility Specialist Time Calculation (min) (ACUTE ONLY) 24 min   Pt received sitting EOB, agreeable to mobility. Required MinA to stand and CGA to ambulate with RW. Tolerated well, asx throughout. Left supine, all needs met.  Talonda Artist Mobility Specialist Please contact via Special Educational Needs Teacher or  Rehab office at [phone]

## 2024-05-08 NOTE — Plan of Care (Signed)
  Problem: Education: Goal: Knowledge of General Education information will improve Description: Including pain rating scale, medication(s)/side effects and non-pharmacologic comfort measures Outcome: Adequate for Discharge   Problem: Clinical Measurements: Goal: Ability to maintain clinical measurements within normal limits will improve Outcome: Adequate for Discharge Goal: Diagnostic test results will improve Outcome: Adequate for Discharge

## 2024-05-09 DIAGNOSIS — C8590 Non-Hodgkin lymphoma, unspecified, unspecified site: Secondary | ICD-10-CM | POA: Diagnosis not present

## 2024-05-09 MED ORDER — DOCUSATE SODIUM 50 MG/5ML PO LIQD
100.0000 mg | Freq: Two times a day (BID) | ORAL | Status: DC | PRN
  Administered 2024-05-09 – 2024-05-10 (×2): 100 mg via ORAL
  Filled 2024-05-09 (×3): qty 10

## 2024-05-09 NOTE — Progress Notes (Signed)
 " PROGRESS NOTE    Cody Palmer  FMW:[id number] DOB: 12-15-1932 DOA: 05/02/2024 PCP: [name], NP   Brief Narrative:   89 y.o. male, with stage IIIa CKD, hypertension, hyperlipidemia, type 2 diabetes mellitus, controlled, with recent hospitalization due to AKI, workup significant for non-Hodgkin's lymphoma. - Patient started on antibiotic during recent hospitalization for SBP, and recurrent ascites, fluid biopsy was significant for non Hodgkin's lymphoma.  Pending insurance auth to go back to [facility]. P2P needed.  Assessment & Plan:  Principal Problem:   Non Hodgkin's lymphoma (HCC) Active Problems:   Chronic kidney disease, stage 3a (HCC)   Essential hypertension   Hyperlipidemia   Follicular lymphoma (HCC)   GERD without esophagitis    Non-Hodgkin lymphoma - s/p Rituximab  on 12/19 - High risk for TLS - Continue to follow oncology service recommendation - Maintain adequate hydration - Follow CMP, CBC, uric acid, phosphorus and LDH.  Per oncology service transition to once a day only; goal numbers continue trending down adequately. - Steroids dced - s/p IR guided biopsy of the retroperitoneal/para-aortic lymph node done on 12/23 - Patient is no longer demonstrating any jaundice and his LFTs has continue adequately improving. - Outpatient follow up oncology for second dose of rituximab .   Transaminitis/recurring ascites and hyperbilirubinemia - Continue to follow LFTs trend - Most likely associated with obstruction/hepatic stasis due to ongoing lymphoma -LFTs trending down.  Continue to monitor levels intermittently. - Status post paracentesis on 05/01/2024 and 05/06/24  - Continue treatment with ceftriaxone  for SBP prophylaxis. - Continue minimizing hepatotoxic agents - Continue to maintain adequate hydration - Continue to follow clinical response.   Chronic kidney disease stage IIIb - Appears to be stable and currently at baseline - Continue to follow  renal function trend. - Excellent response to fluid and Lasix ; renal function at the best level in a while after intervention. - Continue to follow trend intermittently. -Cr 0.82 currently   Essential hypertension - Blood pressure stable - Follow-up vital sign - Holding antihypertensive agents to minimize the chance of hypotension.   Type 2 diabetes - Sliding scale insulin  has been started - Hold oral hypoglycemic agent - Follow CBG fluctuation.   Hypokalemia: prn repletion  Disposition: back to SNF, awaiting auth. P2P tried on 12/25 but office is closed.  DVT prophylaxis: heparin  injection 5,000 Units Start: 05/03/24 0600 Maintain sequential compression device Start: 05/02/24 1613     Code Status: Limited: Do not attempt resuscitation (DNR) -DNR-LIMITED -Do Not Intubate/DNI  Family Communication:  daughter and grand daughters at bedside  Status is: Inpatient Remains inpatient appropriate because: Pending placement    Subjective:  No acute events overnight. Denies any active complaints. Awaiting insurance auth.  Examination:  General exam: Appears calm and comfortable  Respiratory system: Clear to auscultation. Respiratory effort normal. Cardiovascular system: S1 & S2 heard, RRR. No JVD, murmurs, rubs, gallops or clicks. No pedal edema. Gastrointestinal system: Abdomen is nondistended, soft and nontender. No organomegaly or masses felt. Normal bowel sounds heard. Central nervous system: Alert and oriented. No focal neurological deficits. Extremities: Symmetric 5 x 5 power. Skin: No rashes, lesions or ulcers Psychiatry: Judgement and insight appear normal. Mood & affect appropriate.     Diet Orders (From admission, onward)     Start     Ordered   05/07/24 1200  Diet heart healthy/carb modified Room service appropriate? Yes; Fluid consistency: Thin  Diet effective now       Question Answer Comment  Diet-HS Snack? Nothing  Room service appropriate? Yes   Fluid  consistency: Thin      05/07/24 1200            Objective: Vitals:   05/08/24 0319 05/08/24 1303 05/08/24 1936 05/09/24 0426  BP: 139/69 126/60 134/64 132/72  Pulse: 92 89 75 90  Resp: 20 18 19 18   Temp: 97.8 F (36.6 C) 98.5 F (36.9 C) 98.2 F (36.8 C) 98.3 F (36.8 C)  TempSrc:  Oral Oral Oral  SpO2: 93% 98% 96% 95%    Intake/Output Summary (Last 24 hours) at 05/09/2024 1008 Last data filed at 05/09/2024 0758 Gross per 24 hour  Intake 240 ml  Output 800 ml  Net -560 ml   There were no vitals filed for this visit.  Scheduled Meds:  allopurinol   150 mg Oral Daily   Chlorhexidine  Gluconate Cloth  6 each Topical Q0600   heparin   5,000 Units Subcutaneous Q8H   insulin  aspart  0-5 Units Subcutaneous QHS   insulin  aspart  0-9 Units Subcutaneous TID WC   pantoprazole   40 mg Oral Daily   Continuous Infusions:  sodium chloride  Stopped (05/03/24 1507)   sodium chloride  Stopped (05/03/24 1105)    Nutritional status     There is no height or weight on file to calculate BMI.  Data Reviewed:   CBC: Recent Labs  Lab 05/02/24 1349 05/03/24 0458 05/05/24 1635 05/06/24 0406 05/06/24 1649 05/07/24 0436 05/07/24 1908  WBC 9.3   < > 5.4 4.7 5.3 5.1 6.1  NEUTROABS 5.8  --   --   --   --   --   --   HGB 10.7*   < > 9.9* 8.9* 9.3* 8.7* 9.4*  HCT 33.4*   < > 32.0* 29.5* 29.7* 27.6* 31.0*  MCV 86.8   < > 89.1 88.9 88.9 89.6 91.2  PLT 261   < > 221 187 194 199 224   < > = values in this interval not displayed.   Basic Metabolic Panel: Recent Labs  Lab 05/02/24 1349 05/02/24 1630 05/05/24 0333 05/05/24 1635 05/06/24 0406 05/06/24 1649 05/07/24 0436 05/07/24 1908  NA  --    < > 142 142 143 142 143  --   K  --    < > 3.7 3.8 3.8 4.0 3.9  --   CL  --    < > 104 104 107 107 107  --   CO2  --    < > 25 24 25 24 25   --   GLUCOSE  --    < > 107* 138* 118* 139* 119*  --   BUN  --    < > 38* 34* 31* 30* 26*  --   CREATININE  --    < > 1.26* 1.12 0.95 0.91 0.82   --   CALCIUM   --    < > 7.3* 7.5* 7.6* 7.5* 7.4*  --   MG 2.0  --   --   --   --   --   --   --   PHOS 3.8   < > 4.8* 4.5 4.4 3.6 3.2 3.2   < > = values in this interval not displayed.   GFR: Estimated Creatinine Clearance: 68.3 mL/min (by C-G formula based on SCr of 0.82 mg/dL). Liver Function Tests: Recent Labs  Lab 05/05/24 0333 05/05/24 1635 05/06/24 0406 05/06/24 1649 05/07/24 0436  AST 151* 86* 56* 41 34  ALT 183* 158* 121* 107* 81*  ALKPHOS 1,492*  1,413* 1,129* 1,033* 815*  BILITOT 1.2 0.9 0.8 0.7 0.7  PROT 4.3* 4.7* 4.4* 4.6* 4.4*  ALBUMIN  2.4* 2.6* 2.5* 2.6* 2.7*   No results for input(s): LIPASE, AMYLASE in the last 168 hours. Recent Labs  Lab 05/03/24 0458  AMMONIA 29   Coagulation Profile: Recent Labs  Lab 05/07/24 0436  INR 1.0   Cardiac Enzymes: No results for input(s): CKTOTAL, CKMB, CKMBINDEX, TROPONINI in the last 168 hours. BNP (last 3 results) No results for input(s): PROBNP in the last 8760 hours. HbA1C: No results for input(s): HGBA1C in the last 72 hours. CBG: Recent Labs  Lab 05/07/24 0726 05/07/24 1124 05/07/24 1623 05/07/24 2009 05/08/24 0722  GLUCAP 115* 115* 138* 136* 122*   Lipid Profile: No results for input(s): CHOL, HDL, LDLCALC, TRIG, CHOLHDL, LDLDIRECT in the last 72 hours. Thyroid Function Tests: No results for input(s): TSH, T4TOTAL, FREET4, T3FREE, THYROIDAB in the last 72 hours. Anemia Panel: No results for input(s): VITAMINB12, FOLATE, FERRITIN, TIBC, IRON, RETICCTPCT in the last 72 hours. Sepsis Labs: No results for input(s): PROCALCITON, LATICACIDVEN in the last 168 hours.  Recent Results (from the past 240 hours)  MRSA Next Gen by PCR, Nasal     Status: None   Collection Time: 05/02/24  3:30 PM   Specimen: Nasal Mucosa; Nasal Swab  Result Value Ref Range Status   MRSA by PCR Next Gen NOT DETECTED NOT DETECTED Final    Comment: (NOTE) The GeneXpert MRSA  Assay (FDA approved for NASAL specimens only), is one component of a comprehensive MRSA colonization surveillance program. It is not intended to diagnose MRSA infection nor to guide or monitor treatment for MRSA infections. Test performance is not FDA approved in patients less than 12 years old. Performed at [facility], [address] [place], [place] [address]          Radiology Studies: CT BIOPSY Result Date: 05/07/2024 EXAM: CT-guided core biopsy left paraaortic retroperitoneal adenopathy COMPARISON: PET CT 05/02/2024 CLINICAL HISTORY: Lymphadenopathy Previous biopsy of same target: No Complications: No immediate complications. Conscious Sedation: 1.5 mg Versed  IV, 75 mcg fentanyl  IV. Sedation Technique: Under physician supervision, Versed  and fentanyl  were administered IV for moderate sedation. Pulse oximetry, heart rate, and blood pressure were continuously monitored with an independent trained observer present. Physician face-to-face sedation time: 12 minutes. All CT scans at this facility use dose modulation, iterative reconstruction, and/or size based dosing when appropriate to reduce radiation dose to as low as reasonably achievable. Discussion: Time out performed including verification of patient, date of birth, procedure, and sidedness as appropriate. Informed written consent was obtained. The site was prepared and draped using maximal sterile barrier technique including cutaneous antisepsis. The patient was positioned Prone. Initial imaging was performed. Biopsy target: Organ or target location: Retroperitoneal adenopathy, left paraaortic. Laterality: Left. Maximal diameter: 2.8 cm. Other findings: None. Local anesthesia was administered. Under CT guidance, the 17 gauge core biopsy needle was advanced from a posterior approach to the margin of the lesion. Coaxial 18 gauge core biopsy samples were obtained, submitted and in saline to surgical pathology. Biopsy details: Biopsy type: Core  Coaxial needle: 17G Core needle device: Biopince Core needle size: 18G Number of core specimens: Multiple core specimens obtained. Fine needle aspiration performed: No. FNA needle size: N/A Number of FNA specimens: N/A On-site assessment of biopsy adequacy: No. Preliminary assessment of sample adequacy: N/A The biopsy needle was removed and a sterile dressing was applied. Tract closure: None Post-procedure imaging: Immediate post-biopsy imaging: Noncontrast CT  Post-biopsy imaging findings: No hemorrhage or other apparent complication. Contrast: Contrast agent: None. Contrast volume: 0 mL. Estimated blood loss: Less than 10 mL. Specimens sent for evaluation. IMPRESSION: 1. Successful core biopsy of left paraaortic adenopathy. 2. No immediate complications. Electronically signed by: Katheleen Faes MD 05/07/2024 03:06 PM EST RP Workstation: [id number]           LOS: 7 days   Time spent= 35 mins    Deliliah Room, MD [facility]  If 7PM-7AM, please contact night-coverage  05/09/2024, 10:08 AM  "

## 2024-05-09 NOTE — Plan of Care (Signed)

## 2024-05-09 NOTE — TOC Progression Note (Addendum)
 Transition of Care [facility]) - Progression Note    Patient Details  Name: Cody Palmer MRN: [id number] Date of Birth: 11-28-1932  Transition of Care [facility]) CM/SW Contact  Hoy DELENA Bigness, LCSW Phone Number: 05/09/2024, 8:36 AM  Clinical Narrative:    Pt's insurance auth for SNF offering P2P. Information provided to attending. P2P deadline is 12/26 at 12pm.   Spoke with family to inform of P2P and discuss pt returning home with [facility]. [name] shares that pt is still weak and lives at home alone and she does not feel that he would be able to safely manage at home. [name] would like to await outcome of P2P and if insurance denies is interested in appealing decision. ICM will continue to follow.   Expected Discharge Plan: Skilled Nursing Facility Barriers to Discharge: Continued Medical Work up               Expected Discharge Plan and Services In-house Referral: Clinical Social Work Discharge Planning Services: CM Consult   Living arrangements for the past 2 months: Single Family Home Expected Discharge Date: 05/07/24                                     Social Drivers of Health (SDOH) Interventions SDOH Screenings   Food Insecurity: No Food Insecurity (05/02/2024)  Housing: Patient Declined (05/02/2024)  Transportation Needs: Patient Declined (05/02/2024)  Utilities: Patient Declined (05/02/2024)  Depression (PHQ2-9): Low Risk (05/02/2024)  Social Connections: Patient Declined (05/02/2024)  Recent Concern: Social Connections - Moderately Isolated (04/10/2024)  Tobacco Use: Medium Risk (05/06/2024)    Readmission Risk Interventions    04/19/2024    1:46 PM  Readmission Risk Prevention Plan  Transportation Screening Complete  PCP or Specialist Appt within 3-5 Days Not Complete  HRI or Home Care Consult Complete  Social Work Consult for Recovery Care Planning/Counseling Complete  Palliative Care Screening Not Applicable  Medication Review Special Educational Needs Teacher) Complete

## 2024-05-10 DIAGNOSIS — C8590 Non-Hodgkin lymphoma, unspecified, unspecified site: Secondary | ICD-10-CM | POA: Diagnosis not present

## 2024-05-10 NOTE — Plan of Care (Signed)
  Problem: Education: Goal: Knowledge of General Education information will improve Description: Including pain rating scale, medication(s)/side effects and non-pharmacologic comfort measures Outcome: Adequate for Discharge   Problem: Clinical Measurements: Goal: Ability to maintain clinical measurements within normal limits will improve Outcome: Adequate for Discharge Goal: Diagnostic test results will improve Outcome: Adequate for Discharge

## 2024-05-10 NOTE — Progress Notes (Signed)
 " PROGRESS NOTE    Cody Palmer  FMW:[id number] DOB: [birth year] DOA: 05/02/2024 PCP: [name], NP   Brief Narrative:   89 y.o. male, with stage IIIa CKD, hypertension, hyperlipidemia, type 2 diabetes mellitus, controlled, with recent hospitalization due to AKI, workup significant for non-Hodgkin's lymphoma. - Patient started on antibiotic during recent hospitalization for SBP, and recurrent ascites, fluid biopsy was significant for non Hodgkin's lymphoma.  Back to [facility] on Monday.  Assessment & Plan:  Principal Problem:   Non Hodgkin's lymphoma (HCC) Active Problems:   Chronic kidney disease, stage 3a (HCC)   Essential hypertension   Hyperlipidemia   Follicular lymphoma (HCC)   GERD without esophagitis    Non-Hodgkin lymphoma - s/p Rituximab  on 12/19 - High risk for TLS - Continue to follow oncology service recommendation - Maintain adequate hydration - Follow CMP, CBC, uric acid, phosphorus and LDH.  Per oncology service transition to once a day only; goal numbers continue trending down adequately. - Steroids dced - s/p IR guided biopsy of the retroperitoneal/para-aortic lymph node done on 12/23 - Patient is no longer demonstrating any jaundice and his LFTs has continue adequately improving. - Outpatient follow up oncology for second dose of rituximab .   Transaminitis/recurring ascites and hyperbilirubinemia - Continue to follow LFTs trend - Most likely associated with obstruction/hepatic stasis due to ongoing lymphoma -LFTs trending down.  Continue to monitor levels intermittently. - Status post paracentesis on 05/01/2024 and 05/06/24  - Continue treatment with ceftriaxone  for SBP prophylaxis. - Continue minimizing hepatotoxic agents - Continue to maintain adequate hydration - Continue to follow clinical response.   Chronic kidney disease stage IIIb - Appears to be stable and currently at baseline - Continue to follow renal function trend. -  Excellent response to fluid and Lasix ; renal function at the best level in a while after intervention. - Continue to follow trend intermittently. -Cr 0.82 currently   Essential hypertension - Blood pressure stable - Follow-up vital sign - Holding antihypertensive agents to minimize the chance of hypotension.   Type 2 diabetes - Sliding scale insulin  has been started - Hold oral hypoglycemic agent - Follow CBG fluctuation.   Hypokalemia: prn repletion  Disposition: Back to SNF,insurance auth approved.[facility] can accept on Monday.  DVT prophylaxis: heparin  injection 5,000 Units Start: 05/03/24 0600 Maintain sequential compression device Start: 05/02/24 1613     Code Status: Limited: Do not attempt resuscitation (DNR) -DNR-LIMITED -Do Not Intubate/DNI  Family Communication:  daughter and grand daughters at bedside  Status is: Inpatient Remains inpatient appropriate because: Pending placement    Subjective:  No acute events overnight. Denies any active complaints. Insurance auth received.  Examination:  General exam: Appears calm and comfortable  Respiratory system: Clear to auscultation. Respiratory effort normal. Cardiovascular system: S1 & S2 heard, RRR. No JVD, murmurs, rubs, gallops or clicks. No pedal edema. Gastrointestinal system: Abdomen is nondistended, soft and nontender. No organomegaly or masses felt. Normal bowel sounds heard. Central nervous system: Alert and oriented. No focal neurological deficits. Extremities: Symmetric 5 x 5 power. Skin: No rashes, lesions or ulcers Psychiatry: Judgement and insight appear normal. Mood & affect appropriate.     Diet Orders (From admission, onward)     Start     Ordered   05/07/24 1200  Diet heart healthy/carb modified Room service appropriate? Yes; Fluid consistency: Thin  Diet effective now       Question Answer Comment  Diet-HS Snack? Nothing   Room service appropriate? Yes   Fluid  consistency: Thin      05/07/24  1200            Objective: Vitals:   05/09/24 1348 05/09/24 2001 05/09/24 2100 05/10/24 0513  BP: (!) 127/57 131/67 131/67 (!) 115/54  Pulse: 82 94 94 83  Resp: 18 18 18 17   Temp: 98 F (36.7 C) 99.1 F (37.3 C) 99.1 F (37.3 C) 98.1 F (36.7 C)  TempSrc: Oral Oral Oral   SpO2: 98% 96% 96% 95%    Intake/Output Summary (Last 24 hours) at 05/10/2024 1122 Last data filed at 05/10/2024 1009 Gross per 24 hour  Intake 960 ml  Output 1400 ml  Net -440 ml   There were no vitals filed for this visit.  Scheduled Meds:  allopurinol   150 mg Oral Daily   heparin   5,000 Units Subcutaneous Q8H   insulin  aspart  0-5 Units Subcutaneous QHS   insulin  aspart  0-9 Units Subcutaneous TID WC   pantoprazole   40 mg Oral Daily   Continuous Infusions:  sodium chloride  Stopped (05/03/24 1507)   sodium chloride  Stopped (05/03/24 1105)    Nutritional status     There is no height or weight on file to calculate BMI.  Data Reviewed:   CBC: Recent Labs  Lab 05/05/24 1635 05/06/24 0406 05/06/24 1649 05/07/24 0436 05/07/24 1908  WBC 5.4 4.7 5.3 5.1 6.1  HGB 9.9* 8.9* 9.3* 8.7* 9.4*  HCT 32.0* 29.5* 29.7* 27.6* 31.0*  MCV 89.1 88.9 88.9 89.6 91.2  PLT 221 187 194 199 224   Basic Metabolic Panel: Recent Labs  Lab 05/05/24 0333 05/05/24 1635 05/06/24 0406 05/06/24 1649 05/07/24 0436 05/07/24 1908  NA 142 142 143 142 143  --   K 3.7 3.8 3.8 4.0 3.9  --   CL 104 104 107 107 107  --   CO2 25 24 25 24 25   --   GLUCOSE 107* 138* 118* 139* 119*  --   BUN 38* 34* 31* 30* 26*  --   CREATININE 1.26* 1.12 0.95 0.91 0.82  --   CALCIUM  7.3* 7.5* 7.6* 7.5* 7.4*  --   PHOS 4.8* 4.5 4.4 3.6 3.2 3.2   GFR: Estimated Creatinine Clearance: 68.3 mL/min (by C-G formula based on SCr of 0.82 mg/dL). Liver Function Tests: Recent Labs  Lab 05/05/24 0333 05/05/24 1635 05/06/24 0406 05/06/24 1649 05/07/24 0436  AST 151* 86* 56* 41 34  ALT 183* 158* 121* 107* 81*  ALKPHOS 1,492*  1,413* 1,129* 1,033* 815*  BILITOT 1.2 0.9 0.8 0.7 0.7  PROT 4.3* 4.7* 4.4* 4.6* 4.4*  ALBUMIN  2.4* 2.6* 2.5* 2.6* 2.7*   No results for input(s): LIPASE, AMYLASE in the last 168 hours. No results for input(s): AMMONIA in the last 168 hours.  Coagulation Profile: Recent Labs  Lab 05/07/24 0436  INR 1.0   Cardiac Enzymes: No results for input(s): CKTOTAL, CKMB, CKMBINDEX, TROPONINI in the last 168 hours. BNP (last 3 results) No results for input(s): PROBNP in the last 8760 hours. HbA1C: No results for input(s): HGBA1C in the last 72 hours. CBG: Recent Labs  Lab 05/07/24 0726 05/07/24 1124 05/07/24 1623 05/07/24 2009 05/08/24 0722  GLUCAP 115* 115* 138* 136* 122*   Lipid Profile: No results for input(s): CHOL, HDL, LDLCALC, TRIG, CHOLHDL, LDLDIRECT in the last 72 hours. Thyroid Function Tests: No results for input(s): TSH, T4TOTAL, FREET4, T3FREE, THYROIDAB in the last 72 hours. Anemia Panel: No results for input(s): VITAMINB12, FOLATE, FERRITIN, TIBC, IRON, RETICCTPCT in the last 72  hours. Sepsis Labs: No results for input(s): PROCALCITON, LATICACIDVEN in the last 168 hours.  Recent Results (from the past 240 hours)  MRSA Next Gen by PCR, Nasal     Status: None   Collection Time: 05/02/24  3:30 PM   Specimen: Nasal Mucosa; Nasal Swab  Result Value Ref Range Status   MRSA by PCR Next Gen NOT DETECTED NOT DETECTED Final    Comment: (NOTE) The GeneXpert MRSA Assay (FDA approved for NASAL specimens only), is one component of a comprehensive MRSA colonization surveillance program. It is not intended to diagnose MRSA infection nor to guide or monitor treatment for MRSA infections. Test performance is not FDA approved in patients less than 85 years old. Performed at [facility], [address] [place], [place] [address]          Radiology Studies: No results found.          LOS: 8 days   Time  spent= 35 mins    Deliliah Room, MD [facility]  If 7PM-7AM, please contact night-coverage  05/10/2024, 11:22 AM  "

## 2024-05-10 NOTE — TOC Progression Note (Signed)
 Transition of Care [facility]) - Progression Note    Patient Details  Name: Cody Palmer MRN: [id number] Date of Birth: Oct 30, 1932  Transition of Care [facility]) CM/SW Contact  Hoy DELENA Bigness, LCSW Phone Number: 05/10/2024, 11:18 AM  Clinical Narrative:    Pt's insurance authorization has been approved. [facility] unable to accept until Monday due to having no admission RN's available.    Expected Discharge Plan: Skilled Nursing Facility Barriers to Discharge: Continued Medical Work up               Expected Discharge Plan and Services In-house Referral: Clinical Social Work Discharge Planning Services: CM Consult   Living arrangements for the past 2 months: Single Family Home Expected Discharge Date: 05/07/24                                     Social Drivers of Health (SDOH) Interventions SDOH Screenings   Food Insecurity: No Food Insecurity (05/02/2024)  Housing: Patient Declined (05/02/2024)  Transportation Needs: Patient Declined (05/02/2024)  Utilities: Patient Declined (05/02/2024)  Depression (PHQ2-9): Low Risk (05/02/2024)  Social Connections: Patient Declined (05/02/2024)  Recent Concern: Social Connections - Moderately Isolated (04/10/2024)  Tobacco Use: Medium Risk (05/06/2024)    Readmission Risk Interventions    04/19/2024    1:46 PM  Readmission Risk Prevention Plan  Transportation Screening Complete  PCP or Specialist Appt within 3-5 Days Not Complete  HRI or Home Care Consult Complete  Social Work Consult for Recovery Care Planning/Counseling Complete  Palliative Care Screening Not Applicable  Medication Review Oceanographer) Complete

## 2024-05-11 DIAGNOSIS — C8590 Non-Hodgkin lymphoma, unspecified, unspecified site: Secondary | ICD-10-CM | POA: Diagnosis not present

## 2024-05-11 NOTE — Plan of Care (Signed)

## 2024-05-11 NOTE — Plan of Care (Signed)
   Problem: Education: Goal: Knowledge of General Education information will improve Description: Including pain rating scale, medication(s)/side effects and non-pharmacologic comfort measures Outcome: Progressing   Problem: Clinical Measurements: Goal: Ability to maintain clinical measurements within normal limits will improve Outcome: Progressing Goal: Respiratory complications will improve Outcome: Progressing   Problem: Activity: Goal: Risk for activity intolerance will decrease Outcome: Progressing   Problem: Coping: Goal: Level of anxiety will decrease Outcome: Progressing

## 2024-05-11 NOTE — Progress Notes (Signed)
 " PROGRESS NOTE    Cody Palmer  FMW:[id number] DOB: 08-Sep-1932 DOA: 05/02/2024 PCP: [name], NP   Brief Narrative:   89 y.o. male, with stage IIIa CKD, hypertension, hyperlipidemia, type 2 diabetes mellitus, controlled, with recent hospitalization due to AKI, workup significant for non-Hodgkin's lymphoma. - Patient started on antibiotic during recent hospitalization for SBP, and recurrent ascites, fluid biopsy was significant for non Hodgkin's lymphoma.  Back to [facility] on Monday.  Assessment & Plan:  Principal Problem:   Non Hodgkin's lymphoma (HCC) Active Problems:   Chronic kidney disease, stage 3a (HCC)   Essential hypertension   Hyperlipidemia   Follicular lymphoma (HCC)   GERD without esophagitis    Non-Hodgkin lymphoma - s/p Rituximab  on 12/19 - High risk for TLS - Continue to follow oncology service recommendation - Maintain adequate hydration - Follow CMP, CBC, uric acid, phosphorus and LDH.  Per oncology service transition to once a day only; goal numbers continue trending down adequately. - Steroids dced - s/p IR guided biopsy of the retroperitoneal/para-aortic lymph node done on 12/23 - Patient is no longer demonstrating any jaundice and his LFTs has continue adequately improving. - Outpatient follow up oncology for second dose of rituximab .   Transaminitis/recurring ascites and hyperbilirubinemia - Continue to follow LFTs trend - Most likely associated with obstruction/hepatic stasis due to ongoing lymphoma -LFTs trending down.  Continue to monitor levels intermittently. - Status post paracentesis on 05/01/2024 and 05/06/24  - Received IV ceftriaxone  for SBP prophylaxis. - Continue minimizing hepatotoxic agents - Continue to maintain adequate hydration - Continue to follow clinical response.   Chronic kidney disease stage IIIb - Appears to be stable and currently at baseline - Continue to follow renal function trend. - Excellent  response to fluid and Lasix ; renal function at the best level in a while after intervention. - Continue to follow trend intermittently. -Cr 0.82 currently   Essential hypertension - Blood pressure stable - Follow-up vital sign - Holding antihypertensive agents to minimize the chance of hypotension.   Type 2 diabetes - Sliding scale insulin  has been started - Hold oral hypoglycemic agent - Follow CBG fluctuation.   Hypokalemia: prn repletion  Disposition: Back to SNF,insurance auth approved.[facility] can accept on Monday.  DVT prophylaxis: heparin  injection 5,000 Units Start: 05/03/24 0600 Maintain sequential compression device Start: 05/02/24 1613     Code Status: Limited: Do not attempt resuscitation (DNR) -DNR-LIMITED -Do Not Intubate/DNI  Family Communication:  daughter and grand daughters at bedside  Status is: Inpatient Remains inpatient appropriate because: Pending placement    Subjective:  No acute events overnight. Denies any active complaints. Insurance auth received.  Examination:  General exam: Appears calm and comfortable  Respiratory system: Clear to auscultation. Respiratory effort normal. Cardiovascular system: S1 & S2 heard, RRR. No JVD, murmurs, rubs, gallops or clicks. No pedal edema. Gastrointestinal system: Abdomen is nondistended, soft and nontender. No organomegaly or masses felt. Normal bowel sounds heard. Central nervous system: Alert and oriented. No focal neurological deficits. Extremities: Symmetric 5 x 5 power. Skin: No rashes, lesions or ulcers Psychiatry: Judgement and insight appear normal. Mood & affect appropriate.     Diet Orders (From admission, onward)     Start     Ordered   05/07/24 1200  Diet heart healthy/carb modified Room service appropriate? Yes; Fluid consistency: Thin  Diet effective now       Question Answer Comment  Diet-HS Snack? Nothing   Room service appropriate? Yes   Fluid consistency:  Thin      05/07/24 1200             Objective: Vitals:   05/10/24 1348 05/10/24 2220 05/11/24 0505 05/11/24 0551  BP: (!) 126/55 132/62 (!) 98/38 127/60  Pulse: 94 88 71 84  Resp: 17 18 18 18   Temp: 97.8 F (36.6 C) 98.6 F (37 C) 98.5 F (36.9 C) 98.7 F (37.1 C)  TempSrc: Oral Oral Oral Oral  SpO2: 95% 94% 96% 94%    Intake/Output Summary (Last 24 hours) at 05/11/2024 0810 Last data filed at 05/10/2024 1818 Gross per 24 hour  Intake 720 ml  Output --  Net 720 ml   There were no vitals filed for this visit.  Scheduled Meds:  allopurinol   150 mg Oral Daily   heparin   5,000 Units Subcutaneous Q8H   insulin  aspart  0-5 Units Subcutaneous QHS   insulin  aspart  0-9 Units Subcutaneous TID WC   pantoprazole   40 mg Oral Daily   Continuous Infusions:  sodium chloride  Stopped (05/03/24 1507)   sodium chloride  Stopped (05/03/24 1105)    Nutritional status     There is no height or weight on file to calculate BMI.  Data Reviewed:   CBC: Recent Labs  Lab 05/05/24 1635 05/06/24 0406 05/06/24 1649 05/07/24 0436 05/07/24 1908  WBC 5.4 4.7 5.3 5.1 6.1  HGB 9.9* 8.9* 9.3* 8.7* 9.4*  HCT 32.0* 29.5* 29.7* 27.6* 31.0*  MCV 89.1 88.9 88.9 89.6 91.2  PLT 221 187 194 199 224   Basic Metabolic Panel: Recent Labs  Lab 05/05/24 0333 05/05/24 1635 05/06/24 0406 05/06/24 1649 05/07/24 0436 05/07/24 1908  NA 142 142 143 142 143  --   K 3.7 3.8 3.8 4.0 3.9  --   CL 104 104 107 107 107  --   CO2 25 24 25 24 25   --   GLUCOSE 107* 138* 118* 139* 119*  --   BUN 38* 34* 31* 30* 26*  --   CREATININE 1.26* 1.12 0.95 0.91 0.82  --   CALCIUM  7.3* 7.5* 7.6* 7.5* 7.4*  --   PHOS 4.8* 4.5 4.4 3.6 3.2 3.2   GFR: Estimated Creatinine Clearance: 68.3 mL/min (by C-G formula based on SCr of 0.82 mg/dL). Liver Function Tests: Recent Labs  Lab 05/05/24 0333 05/05/24 1635 05/06/24 0406 05/06/24 1649 05/07/24 0436  AST 151* 86* 56* 41 34  ALT 183* 158* 121* 107* 81*  ALKPHOS 1,492* 1,413* 1,129*  1,033* 815*  BILITOT 1.2 0.9 0.8 0.7 0.7  PROT 4.3* 4.7* 4.4* 4.6* 4.4*  ALBUMIN  2.4* 2.6* 2.5* 2.6* 2.7*   No results for input(s): LIPASE, AMYLASE in the last 168 hours. No results for input(s): AMMONIA in the last 168 hours.  Coagulation Profile: Recent Labs  Lab 05/07/24 0436  INR 1.0   Cardiac Enzymes: No results for input(s): CKTOTAL, CKMB, CKMBINDEX, TROPONINI in the last 168 hours. BNP (last 3 results) No results for input(s): PROBNP in the last 8760 hours. HbA1C: No results for input(s): HGBA1C in the last 72 hours. CBG: Recent Labs  Lab 05/07/24 0726 05/07/24 1124 05/07/24 1623 05/07/24 2009 05/08/24 0722  GLUCAP 115* 115* 138* 136* 122*   Lipid Profile: No results for input(s): CHOL, HDL, LDLCALC, TRIG, CHOLHDL, LDLDIRECT in the last 72 hours. Thyroid Function Tests: No results for input(s): TSH, T4TOTAL, FREET4, T3FREE, THYROIDAB in the last 72 hours. Anemia Panel: No results for input(s): VITAMINB12, FOLATE, FERRITIN, TIBC, IRON, RETICCTPCT in the last 72 hours. Sepsis  Labs: No results for input(s): PROCALCITON, LATICACIDVEN in the last 168 hours.  Recent Results (from the past 240 hours)  MRSA Next Gen by PCR, Nasal     Status: None   Collection Time: 05/02/24  3:30 PM   Specimen: Nasal Mucosa; Nasal Swab  Result Value Ref Range Status   MRSA by PCR Next Gen NOT DETECTED NOT DETECTED Final    Comment: (NOTE) The GeneXpert MRSA Assay (FDA approved for NASAL specimens only), is one component of a comprehensive MRSA colonization surveillance program. It is not intended to diagnose MRSA infection nor to guide or monitor treatment for MRSA infections. Test performance is not FDA approved in patients less than 63 years old. Performed at [facility], [address] [place], [place] [address]          Radiology Studies: No results found.          LOS: 9 days   Time spent= 35  mins    Deliliah Room, MD [facility]  If 7PM-7AM, please contact night-coverage  05/11/2024, 8:10 AM  "

## 2024-05-12 DIAGNOSIS — N1831 Chronic kidney disease, stage 3a: Secondary | ICD-10-CM | POA: Diagnosis not present

## 2024-05-12 DIAGNOSIS — R7401 Elevation of levels of liver transaminase levels: Secondary | ICD-10-CM

## 2024-05-12 DIAGNOSIS — C8590 Non-Hodgkin lymphoma, unspecified, unspecified site: Secondary | ICD-10-CM | POA: Diagnosis not present

## 2024-05-12 DIAGNOSIS — K219 Gastro-esophageal reflux disease without esophagitis: Secondary | ICD-10-CM | POA: Diagnosis not present

## 2024-05-12 LAB — GLUCOSE, CAPILLARY
Glucose-Capillary: 160 mg/dL — ABNORMAL HIGH (ref 70–99)
Glucose-Capillary: 106 mg/dL — ABNORMAL HIGH (ref 70–99)
Glucose-Capillary: 136 mg/dL — ABNORMAL HIGH (ref 70–99)
Glucose-Capillary: 107 mg/dL — ABNORMAL HIGH (ref 70–99)
Glucose-Capillary: 172 mg/dL — ABNORMAL HIGH (ref 70–99)
Glucose-Capillary: 94 mg/dL (ref 70–99)
Glucose-Capillary: 142 mg/dL — ABNORMAL HIGH (ref 70–99)
Glucose-Capillary: 113 mg/dL — ABNORMAL HIGH (ref 70–99)
Glucose-Capillary: 130 mg/dL — ABNORMAL HIGH (ref 70–99)
Glucose-Capillary: 120 mg/dL — ABNORMAL HIGH (ref 70–99)
Glucose-Capillary: 121 mg/dL — ABNORMAL HIGH (ref 70–99)
Glucose-Capillary: 123 mg/dL — ABNORMAL HIGH (ref 70–99)
Glucose-Capillary: 108 mg/dL — ABNORMAL HIGH (ref 70–99)
Glucose-Capillary: 157 mg/dL — ABNORMAL HIGH (ref 70–99)
Glucose-Capillary: 93 mg/dL (ref 70–99)
Glucose-Capillary: 121 mg/dL — ABNORMAL HIGH (ref 70–99)
Glucose-Capillary: 116 mg/dL — ABNORMAL HIGH (ref 70–99)
Glucose-Capillary: 118 mg/dL — ABNORMAL HIGH (ref 70–99)
Glucose-Capillary: 107 mg/dL — ABNORMAL HIGH (ref 70–99)
Glucose-Capillary: 131 mg/dL — ABNORMAL HIGH (ref 70–99)
Glucose-Capillary: 110 mg/dL — ABNORMAL HIGH (ref 70–99)
Glucose-Capillary: 143 mg/dL — ABNORMAL HIGH (ref 70–99)

## 2024-05-12 MED ORDER — ACETAMINOPHEN 325 MG PO TABS
650.0000 mg | ORAL_TABLET | Freq: Four times a day (QID) | ORAL | Status: DC | PRN
  Administered 2024-05-12: 650 mg via ORAL
  Filled 2024-05-12: qty 2

## 2024-05-12 NOTE — Plan of Care (Signed)
" °  Problem: Activity: Goal: Risk for activity intolerance will decrease Outcome: Adequate for Discharge   Problem: Coping: Goal: Level of anxiety will decrease Outcome: Adequate for Discharge   "

## 2024-05-12 NOTE — Hospital Course (Signed)
 89 y.o. male, with stage IIIa CKD, hypertension, hyperlipidemia, type 2 diabetes mellitus, controlled, with recent hospitalization due to AKI, workup significant for non-Hodgkin's lymphoma.    Assessment and Plan:   Non-Hodgkin's lymphoma - S/p rituximab  12/19.  Follows with oncology service.  Showing improvement in LFTs.   Acute transaminitis/recurring ascites/hyperbilirubinemia - Likely associated with obstruction/hepatic stasis due to ongoing lymphoma.  LFTs improved.  Completed prophylactic antibiotics.   CKD 3B - Creatinine currently at baseline.   Hypertension - Vitals appear stable.   Diabetes mellitus - Currently on sliding scale.   Goals of care - Disposition back to [facility] tomorrow.

## 2024-05-12 NOTE — Progress Notes (Signed)
 " Progress Note   Patient: Cody Palmer FMW:[id number] DOB: 1932/10/10 DOA: 05/02/2024  DOS: the patient was seen and examined on 05/12/2024   Brief hospital course:  89 y.o. male, with stage IIIa CKD, hypertension, hyperlipidemia, type 2 diabetes mellitus, controlled, with recent hospitalization due to AKI, workup significant for non-Hodgkin's lymphoma.   Assessment and Plan:  Non-Hodgkin's lymphoma - S/p rituximab  12/19.  Follows with oncology service.  Showing improvement in LFTs.  Acute transaminitis/recurring ascites/hyperbilirubinemia - Likely associated with obstruction/hepatic stasis due to ongoing lymphoma.  LFTs improved.  Completed prophylactic antibiotics.  CKD 3B - Creatinine currently at baseline.  Hypertension - Vitals appear stable.  Diabetes mellitus - Currently on sliding scale.  Goals of care - Disposition back to [facility] tomorrow.   Subjective: Patient sitting up at the bedside, family there as well.  States he feels well this morning.  Denies any fever, shortness of breath, nausea, vomiting, abdominal pain.  Motivated to sit at bedside chair.  Anticipating discharge tomorrow to [facility].  Physical Exam:  Vitals:   05/11/24 1256 05/11/24 1900 05/11/24 2240 05/12/24 0514  BP: (!) 145/68 (!) 141/60  (!) 118/50  Pulse: 74 78  87  Resp: 18 17  18   Temp: 98.5 F (36.9 C) 98.6 F (37 C)  98.1 F (36.7 C)  TempSrc: Oral Oral    SpO2: 99% 96%  94%  Height:   (P) 5' 9 (1.753 m)     GENERAL:  Alert, pleasant, no acute distress  HEENT:  EOMI CARDIOVASCULAR:  RRR, no murmurs appreciated RESPIRATORY:  Clear to auscultation, no wheezing, rales, or rhonchi GASTROINTESTINAL:  Soft, nontender, nondistended EXTREMITIES:  No LE edema bilaterally NEURO:  No new focal deficits appreciated SKIN:  No rashes noted PSYCH:  Appropriate mood and affect     Data Reviewed:  Imaging Studies: CT BIOPSY Result Date: 05/07/2024 EXAM: CT-guided core biopsy left  paraaortic retroperitoneal adenopathy COMPARISON: PET CT 05/02/2024 CLINICAL HISTORY: Lymphadenopathy Previous biopsy of same target: No Complications: No immediate complications. Conscious Sedation: 1.5 mg Versed  IV, 75 mcg fentanyl  IV. Sedation Technique: Under physician supervision, Versed  and fentanyl  were administered IV for moderate sedation. Pulse oximetry, heart rate, and blood pressure were continuously monitored with an independent trained observer present. Physician face-to-face sedation time: 12 minutes. All CT scans at this facility use dose modulation, iterative reconstruction, and/or size based dosing when appropriate to reduce radiation dose to as low as reasonably achievable. Discussion: Time out performed including verification of patient, date of birth, procedure, and sidedness as appropriate. Informed written consent was obtained. The site was prepared and draped using maximal sterile barrier technique including cutaneous antisepsis. The patient was positioned Prone. Initial imaging was performed. Biopsy target: Organ or target location: Retroperitoneal adenopathy, left paraaortic. Laterality: Left. Maximal diameter: 2.8 cm. Other findings: None. Local anesthesia was administered. Under CT guidance, the 17 gauge core biopsy needle was advanced from a posterior approach to the margin of the lesion. Coaxial 18 gauge core biopsy samples were obtained, submitted and in saline to surgical pathology. Biopsy details: Biopsy type: Core Coaxial needle: 17G Core needle device: Biopince Core needle size: 18G Number of core specimens: Multiple core specimens obtained. Fine needle aspiration performed: No. FNA needle size: N/A Number of FNA specimens: N/A On-site assessment of biopsy adequacy: No. Preliminary assessment of sample adequacy: N/A The biopsy needle was removed and a sterile dressing was applied. Tract closure: None Post-procedure imaging: Immediate post-biopsy imaging: Noncontrast CT Post-biopsy  imaging findings: No hemorrhage or  other apparent complication. Contrast: Contrast agent: None. Contrast volume: 0 mL. Estimated blood loss: Less than 10 mL. Specimens sent for evaluation. IMPRESSION: 1. Successful core biopsy of left paraaortic adenopathy. 2. No immediate complications. Electronically signed by: Katheleen Faes MD 05/07/2024 03:06 PM EST RP Workstation: [id number]   US  Paracentesis Result Date: 05/06/2024 INDICATION: [id number] Ascites 4170 89 year old male. History of lymphoma with recurrent malignant ascites. Request is for therapeutic paracentesis EXAM: ULTRASOUND GUIDED THERAPEUTIC PARACENTESIS MEDICATIONS: Lidocaine  1% 10 mL COMPLICATIONS: None immediate. PROCEDURE: Informed written consent was obtained from the patient after a discussion of the risks, benefits and alternatives to treatment. A timeout was performed prior to the initiation of the procedure. Initial ultrasound scanning demonstrates a moderate amount of ascites within the right lower abdominal quadrant. The right lower abdomen was prepped and draped in the usual sterile fashion. 1% lidocaine  was used for local anesthesia. Following this, a 19 gauge, 7-cm, Yueh catheter was introduced. An ultrasound image was saved for documentation purposes. The paracentesis was performed. The catheter was removed and a dressing was applied. The patient tolerated the procedure well without immediate post procedural complication. FINDINGS: A total of approximately 3 L of straw-colored fluid was removed. IMPRESSION: Successful ultrasound-guided therapeutic paracentesis yielding 3.0 L of peritoneal fluid. Performed by Delon Beagle NP under supervision of Thom Hall, MD Electronically Signed   By: Thom Hall M.D.   On: 05/06/2024 10:39   US  Abdomen Limited RUQ (LIVER/GB) Result Date: 05/03/2024 EXAM: Right Upper Quadrant Abdominal Ultrasound 05/03/2024 08:42:15 AM TECHNIQUE: Real-time ultrasonography of the right upper quadrant of the  abdomen was performed. COMPARISON: US  Abdomen 04/11/2024 and PET CT fusion study dated 05/02/2024. CLINICAL HISTORY: Transaminitis. FINDINGS: LIVER: Normal echogenicity. There is an ill-defined mixed echogenic mass present within the porta hepatitis measuring approximately 9.7 x 9.7 x 5.7 cm, corresponding with the metabolically avid mass noted on the previous PET CT. There is a cyst within the right hepatic lobe measuring approximately 12 mm in diameter. No intrahepatic biliary ductal dilatation. Hepatopetal flow in the portal vein. BILIARY SYSTEM: Gallbladder wall thickness measures 5.9 mm. There is gallbladder wall edema. There is sludge within the gallbladder. The common bile duct measures 3.0 mm. OTHER: There is mild ascites. IMPRESSION: 1. Ill-defined mixed echogenic mass within the porta hepatis measuring approximately 9.7 x 9.7 x 5.7 cm, corresponding with the metabolically avid mass noted on the previous PET CT. 2. Gallbladder sludge with wall edema/thickening (up to 5.9 mm). No focal tenderness over the gallbladder. Common bile duct measures 3 mm. 3. 12 mm right hepatic lobe cyst. 4. Mild ascites. Electronically signed by: Evalene Coho MD 05/03/2024 09:25 AM EST RP Workstation: [id number]   NM PET Image Initial (PI) Skull Base To Thigh Result Date: 05/03/2024 EXAM: PET AND CT SKULL BASE TO MID THIGH 05/02/2024 02:09:18 PM TECHNIQUE: RADIOPHARMACEUTICAL: 10.6 mCi F-18 FDG Uptake time 60 minutes. Glucose level 125 mg/dl. PET imaging was acquired from the base of the skull to the mid thighs. Non-contrast enhanced computed tomography was obtained for attenuation correction and anatomic localization. COMPARISON: CT chest, abdomen and pelvis from 04/05/2024 and 04/04/2024. CLINICAL HISTORY: Abdominal Mass. FINDINGS: HEAD AND NECK: Right orbit lobe prosthesis. Multiple tracer avid left cervical lymph nodes. Index node measures 6 mm with SUV max of 3.4. Tracer avid left supraclavicular lymph node measures  1.2 cm and has an SUV max of 7.0. CHEST: Tracer avid right paratracheal lymph node measures 7 mm with SUV max of 3.4, image 48. Within the posteromediastinum in  the lower chest, there are multiple tracer-avid mediastinal lymph nodes. Along the left side of the distal descending thoracic aorta, a lymph node measures 1.1 cm with an SUV max of 11.0, image 75. Posterior to the left atrium, there is a lymph node which measures 1.3 cm with an SUV max of 7.6, axial image 64. Small bilateral pleural effusions identified. No tracer avid pulmonary nodule or mass identified. Aortic atherosclerosis. Coronary artery calcifications. Amorphous infiltrative soft tissue with Increased tracer uptake noted within the right and left cardiophrenic fat noted. Within the right cardiophrenic fat pad lymph node measures 1.7 cm with SUV max of 4.2, axial image 70. ABDOMEN AND PELVIS: Extensive tracer avid tumor is identified within the abdomen and pelvis with tracer-avid tumor involving the mesentery and retroperitoneum, and peritoneum. This includes: -soft tissue mass within the central mesentery measures 15.9 x 7.3 cm with SUV max of 13.6, axial image 102. -Infiltrative tumor within the ventral abdomen measures 10.0 x 4.3 cm with SUV max of 12.2, axial image 112. -Left retroperitoneal lymph node measures 2.2 cm with SUV max of 15.3, axial image 104. Right retroperitoneal tumor extends into the right perinephric space with SUV max measuring 11.9, axial image 108. -Infiltrative mass within the porta hepatic region extends around the gallbladder and along the falciform ligament with possible invasion into the umbilical vein. This measures 11.0 x 8.5 cm with SUV max of 12.9, image 83. -lymph node within the left inguinal region measures 1.1 cm with an SUV max of 6.0. - small to moderate volume abdominal pelvic ascites. Normal size spleen. No focal areas of increased uptake identified within the splenic parenchyma. The sigmoid colon  demonstrates diverticulosis without evidence of diverticulitis. No bowel wall thickening, pericolonic stranding, abscess, or free air is noted. Physiologic activity within the gastrointestinal and genitourinary systems. BONES AND SOFT TISSUE: Multifocal tracer avid bone lesions are identified. The index lesion within the left iliac bone measures 1.7 cm with an SUV max of 12.2, axial image 125. Index lesion within the proximal left humerus measures 1.4 cm with SUV max of 6.7, axial image 43. The index lesion within the right side of the T3 vertebral body measures 1.3 cm with SUV max of 7.8. IMPRESSION: 1. Extensive tracer-avid tumor is identified within the neck, chest, abdomen, and pelvis. Imaging findings are compatible with biopsy proven lymphoma. 2. Tumor within the abdomen and pelvis involves the peritoneal, mesenteric, and retroperitoneal spaces with bulky mesenteric adenopathy. Tumor within the porta hepatic region invades the falciform ligament with invasion of the umbilical vein. 3. 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 Electronically signed by: Waddell Calk MD 05/03/2024 06:11 AM EST RP Workstation: [id number]   US  Paracentesis Result Date: 05/01/2024 INDICATION: 89 year old male. History of lymphoma with recurrent malignant ascites. Request is for therapeutic paracentesis EXAM: ULTRASOUND GUIDED THERAPEUTIC RIGHT-SIDED PARACENTESIS MEDICATIONS: Lidocaine  1% 10 mL COMPLICATIONS: None immediate. PROCEDURE: Informed written consent was obtained from the patient after a discussion of the risks, benefits and alternatives to treatment. A timeout was performed prior to the initiation of the procedure. Initial ultrasound scanning demonstrates a moderate amount of ascites within the right lower abdominal quadrant. The right lower abdomen was prepped and draped in the usual sterile fashion. 1% lidocaine  was used for local anesthesia. Following this, a 19 gauge, 7-cm, Yueh  catheter was introduced. An ultrasound image was saved for documentation purposes. The paracentesis was performed. The catheter was removed and a dressing was applied. The patient tolerated the procedure well without  immediate post procedural complication. FINDINGS: A total of approximately 4 L of straw-colored fluid was removed. IMPRESSION: Successful ultrasound-guided therapy paracentesis yielding 4 liters of straw-colored peritoneal fluid. Performed by Delon Beagle NP Electronically Signed   By: Wilkie Lent M.D.   On: 05/01/2024 13:01   US  Paracentesis Result Date: 04/24/2024 INDICATION: Patient with recently diagnosed lymphoma, recurrent malignant ascites. Request for therapeutic paracentesis. EXAM: ULTRASOUND GUIDED THERAPEUTIC PARACENTESIS MEDICATIONS: 5 mL 1% lidocaine  COMPLICATIONS: None immediate. PROCEDURE: Informed written consent was obtained from the patient after a discussion of the risks, benefits and alternatives to treatment. A timeout was performed prior to the initiation of the procedure. Initial ultrasound scanning demonstrates a large amount of ascites within the right lower abdominal quadrant. The right lower abdomen was prepped and draped in the usual sterile fashion. 1% lidocaine  was used for local anesthesia. Following this, a 19 gauge, 7-cm, Yueh catheter was introduced. An ultrasound image was saved for documentation purposes. The paracentesis was performed. The catheter was removed and a dressing was applied. The patient tolerated the procedure well without immediate post procedural complication. FINDINGS: A total of approximately 4.0 L of serosanguineous fluid was removed. IMPRESSION: Successful ultrasound-guided paracentesis yielding 4.0 liters of peritoneal fluid. Performed by Clotilda Hesselbach, PA-C Electronically Signed   By: Ester Sides M.D.   On: 04/24/2024 16:19   US  CORE BIOPSY (LYMPH NODES) Result Date: 04/17/2024 INDICATION: Left groin lymphadenopathy.  EXAM: ULTRASOUND GUIDED core needle  BIOPSY OF left inguinal region MEDICATIONS: None. ANESTHESIA/SEDATION: Fentanyl  1.5 mcg IV; Versed  50 mg IV Moderate Sedation Time:  10 The patient was continuously monitored during the procedure by the interventional radiology nurse under my direct supervision. PROCEDURE: The procedure, risks, benefits, and alternatives were explained to the patient. Questions regarding the procedure were encouraged and answered. The patient understands and consents to the procedure. The left groin was prepped with chlorhexidine  in a sterile fashion, and a sterile drape was applied covering the operative field. A sterile gown and sterile gloves were used for the procedure. Local anesthesia was provided with 1% Lidocaine . An introducer needle was advanced through a small incision made in the mid aspect of the left groin. The needle was advanced under ultrasound guidance until the needle tip was engaged within the abnormally enlarged lymph node. The stylet was removed and the BioPince needle was then advanced through the introducer needle until the BioPince needle was engaged within the lymph node tissue. A 2.3 cm core sample was obtained. Sample was placed in saline. A second sample was obtained in similar fashion. All needles removed from the patient. Sterile dressing applied. Final image demonstrates no active hemorrhage. COMPLICATIONS: Satisfactory core needle biopsy of left inguinal region lymph node. Electronically Signed   By: Cordella Banner   On: 04/17/2024 15:24   US  Paracentesis Result Date: 04/17/2024 INDICATION: Patient with enlarging mesenteric mass concerning for lymphoma, new onset ascites. Request for diagnostic and therapeutic paracentesis. EXAM: ULTRASOUND GUIDED DIAGNOSTIC AND THERAPEUTIC PARACENTESIS MEDICATIONS: 6 mL 1% lidocaine  COMPLICATIONS: None immediate. PROCEDURE: Informed written consent was obtained from the patient after a discussion of the risks, benefits and  alternatives to treatment. A timeout was performed prior to the initiation of the procedure. Initial ultrasound scanning demonstrates a moderate amount of ascites within the right lower abdominal quadrant. The right lower abdomen was prepped and draped in the usual sterile fashion. 1% lidocaine  was used for local anesthesia. Following this, a 19 gauge, 7-cm, Yueh catheter was introduced. An ultrasound image was saved for  documentation purposes. The paracentesis was performed. The catheter was removed and a dressing was applied. The patient tolerated the procedure well without immediate post procedural complication. FINDINGS: A total of approximately 2.2 L of bloody fluid was removed. Samples were sent to the laboratory as requested by the clinical team. IMPRESSION: Successful ultrasound-guided paracentesis yielding 2.2 liters of peritoneal fluid. Performed by Clotilda Hesselbach, PA-C Electronically Signed   By: CHRISTELLA.  Shick M.D.   On: 04/17/2024 11:48   CT ABDOMEN PELVIS WO CONTRAST Result Date: 04/16/2024 EXAM: CT ABDOMEN AND PELVIS WITHOUT CONTRAST 04/15/2024 10:17:55 PM TECHNIQUE: CT of the abdomen and pelvis was performed without the administration of intravenous contrast. Multiplanar reformatted images are provided for review. Automated exposure control, iterative reconstruction, and/or weight-based adjustment of the mA/kV was utilized to reduce the radiation dose to as low as reasonably achievable. COMPARISON: Comparison with the 04/04/2024 CLINICAL HISTORY: Abdominal pain, acute, nonlocalized. FINDINGS: LOWER CHEST: Trace bilateral pleural effusions. LIVER: The liver is unremarkable. GALLBLADDER AND BILE DUCTS: Similar reactive gallbladder wall thickening. No biliary ductal dilatation. SPLEEN: No acute abnormality. PANCREAS: Unchanged ill-defined soft tissue about the pancreatic head. This was better assessed on prior CT with contrast. ADRENAL GLANDS: No acute abnormality. KIDNEYS, URETERS AND BLADDER: No  stones in the kidneys or ureters. No hydronephrosis. No perinephric or periureteral stranding. Foley catheter in the decompressed bladder. GI AND BOWEL: Oral contrast is present within the stomach and small bowel. There is dilute contrast in the colon. Wall thickening about the terminal ileum and ascending and transverse colon is grossly similar to prior given differences in contrast administration. There is no bowel obstruction. PERITONEUM AND RETROPERITONEUM: Increased small to moderate abdominal pelvic ascites. Diffuse mesenteric stranding is similar. Redemonstrated ill-defined soft tissue density throughout the base of the mesentery. This was better evaluated with CT with IV contrast on 04/04/2024 but is grossly similar. No free air. VASCULATURE: Aorta is normal in caliber. Aortic atherosclerotic calcification. LYMPH NODES: Unchanged left periaortic lymphadenopathy measuring up to 1.7 cm on series 2 image 45. Similar enlargement of a left inguinal lymph node measuring 1.3 cm. Similar enlargement of a pericardial lymph node measuring 1.8 cm on series 2 image 14. REPRODUCTIVE ORGANS: No acute abnormality. BONES AND SOFT TISSUES: No acute osseous abnormality. No focal soft tissue abnormality. IMPRESSION: 1. Grossly similar ill-defined soft tissue throughout the base of the mesentery. 2. Increased small to moderate abdominopelvic ascites since 11 / 12 / 25 . 3. Wall thickening about the terminal ileum and ascending and transverse colon, grossly similar to prior given differences in contrast administration. 4. Unchanged left periaortic, pericardiac, and left inguinal lymphadenopathy. Electronically signed by: Norman Gatlin MD 04/16/2024 02:38 AM EST RP Workstation: [id number]   US  RENAL Result Date: 04/14/2024 CLINICAL DATA:  Acute kidney injury EXAM: RENAL / URINARY TRACT ULTRASOUND COMPLETE COMPARISON:  CT abdomen pelvis dated 04/04/2024 FINDINGS: Right Kidney: Length = 11.5 cm Normal parenchymal echogenicity  with preserved corticomedullary differentiation. No urinary tract dilation or shadowing calculi. The ureter is not seen. Left Kidney: Length = 11.1 cm Normal parenchymal echogenicity with preserved corticomedullary differentiation. No urinary tract dilation or shadowing calculi. The ureter is not seen. Bladder: Appears normal for degree of bladder distention. Other: Partially imaged small volume ascites. IMPRESSION: 1. No urinary tract dilation or shadowing calculi. Normal cortical echogenicity. 2. Partially imaged small volume ascites. Electronically Signed   By: Limin  Xu M.D.   On: 04/14/2024 15:42   DG Abd 2 Views Result Date: 04/12/2024 CLINICAL DATA:  Abdominal  distention EXAM: ABDOMEN - 2 VIEW COMPARISON:  Abdominal radiograph dated 04/04/2024 FINDINGS: Nonobstructive bowel gas pattern. Relative paucity of bowel gas within the right lower quadrant and central abdomen. No free air or pneumatosis. No abnormal radio-opaque calculi or mass effect. No acute or substantial osseous abnormality. The sacrum and coccyx are partially obscured by overlying bowel contents. IMPRESSION: Nonobstructive bowel gas pattern. Relative paucity of bowel gas within the right lower quadrant and central abdomen may reflect underdistended/compressed or fluid-filled bowel loops. Electronically Signed   By: Limin  Xu M.D.   On: 04/12/2024 16:21    There are no new results to review at this time.  Previous records (including but not limited to H&P, progress notes, nursing notes, TOC management) were reviewed in assessment of this patient.  Labs: CBC: Recent Labs  Lab 05/05/24 1635 05/06/24 0406 05/06/24 1649 05/07/24 0436 05/07/24 1908  WBC 5.4 4.7 5.3 5.1 6.1  HGB 9.9* 8.9* 9.3* 8.7* 9.4*  HCT 32.0* 29.5* 29.7* 27.6* 31.0*  MCV 89.1 88.9 88.9 89.6 91.2  PLT 221 187 194 199 224   Basic Metabolic Panel: Recent Labs  Lab 05/05/24 1635 05/06/24 0406 05/06/24 1649 05/07/24 0436 05/07/24 1908  NA 142 143 142  143  --   K 3.8 3.8 4.0 3.9  --   CL 104 107 107 107  --   CO2 24 25 24 25   --   GLUCOSE 138* 118* 139* 119*  --   BUN 34* 31* 30* 26*  --   CREATININE 1.12 0.95 0.91 0.82  --   CALCIUM  7.5* 7.6* 7.5* 7.4*  --   PHOS 4.5 4.4 3.6 3.2 3.2   Liver Function Tests: Recent Labs  Lab 05/05/24 1635 05/06/24 0406 05/06/24 1649 05/07/24 0436  AST 86* 56* 41 34  ALT 158* 121* 107* 81*  ALKPHOS 1,413* 1,129* 1,033* 815*  BILITOT 0.9 0.8 0.7 0.7  PROT 4.7* 4.4* 4.6* 4.4*  ALBUMIN  2.6* 2.5* 2.6* 2.7*   CBG: Recent Labs  Lab 05/07/24 0726 05/07/24 1124 05/07/24 1623 05/07/24 2009 05/08/24 0722  GLUCAP 115* 115* 138* 136* 122*    Scheduled Meds:  allopurinol   150 mg Oral Daily   heparin   5,000 Units Subcutaneous Q8H   insulin  aspart  0-5 Units Subcutaneous QHS   insulin  aspart  0-9 Units Subcutaneous TID WC   pantoprazole   40 mg Oral Daily   Continuous Infusions:  sodium chloride  Stopped (05/03/24 1507)   sodium chloride  Stopped (05/03/24 1105)   PRN Meds:.sodium chloride , albuterol , alteplase , docusate, heparin  lock flush, hydrALAZINE , melatonin, oxyCODONE , sodium chloride  flush  Family Communication: At bedside  Disposition: Status is: Inpatient Remains inpatient appropriate because: Non-Hodgkin lymphoma     Time spent: 31 minutes  Length of inpatient stay: 10 days  Author: Carliss LELON Canales, DO 05/12/2024 10:15 AM  For on call review [email or web address].   "

## 2024-05-13 ENCOUNTER — Encounter: Payer: Self-pay | Admitting: *Deleted

## 2024-05-13 ENCOUNTER — Inpatient Hospital Stay: Admitting: Dietician

## 2024-05-13 DIAGNOSIS — E78019 Familial hypercholesterolemia, unspecified: Secondary | ICD-10-CM

## 2024-05-13 LAB — GLUCOSE, CAPILLARY
Glucose-Capillary: 107 mg/dL — ABNORMAL HIGH (ref 70–99)
Glucose-Capillary: 153 mg/dL — ABNORMAL HIGH (ref 70–99)

## 2024-05-13 NOTE — Care Management Important Message (Signed)
 Important Message  Patient Details  Name: Cody Palmer MRN: [id number] Date of Birth: 01-Mar-1933   Important Message Given:  Yes - Medicare IM     Lavada Langsam L Donaldson Richter 05/13/2024, 11:49 AM

## 2024-05-13 NOTE — Discharge Summary (Signed)
 " Physician Discharge Summary   Patient: Cody Palmer MRN: [id number] DOB: [birth year]  Admit date:     05/02/2024  Discharge date: 05/13/2024  Discharge Physician: Carliss LELON Canales   PCP: [name], NP   Recommendations at discharge:    Pt to be discharged to [facility].   If you experience worsening fever, chills, chest pain, shortness of breath, or other concerning symptoms, please call your PCP or go to the emergency department immediately.  Discharge Diagnoses: Principal Problem:   Non Hodgkin's lymphoma (HCC) Active Problems:   Chronic kidney disease, stage 3a (HCC)   Essential hypertension   Hyperlipidemia   Follicular lymphoma (HCC)   GERD without esophagitis  Resolved Problems:   * No resolved hospital problems. *   Hospital Course:  89 y.o. male, with stage IIIa CKD, hypertension, hyperlipidemia, type 2 diabetes mellitus, controlled, with recent hospitalization due to AKI, workup significant for non-Hodgkin's lymphoma.    Assessment and Plan:   Non-Hodgkin's lymphoma - S/p rituximab  12/19.  Follows with oncology service.  Showing improvement in LFTs.   Acute transaminitis/recurring ascites/hyperbilirubinemia - Likely associated with obstruction/hepatic stasis due to ongoing lymphoma.  LFTs improved.  Completed prophylactic antibiotics.   CKD 3B - Creatinine currently at baseline.   Hypertension - Vitals appear stable.  No need to resume antihypertensives.   Diabetes mellitus - Resume previous home medication regimen.  Physical debilitation and muscle weakness - PT/OT eval, recommending STR.   Goals of care - Disposition back to [facility] today.   Consultants: Oncology Procedures performed: None Disposition: Skilled nursing facility Diet recommendation:  Regular diet  DISCHARGE MEDICATION: Allergies as of 05/13/2024       Reactions   Rituximab  Shortness Of Breath, Cough   Patient had hypersensitivity reaction to rituximab  which  included SHOB, cough, throat tightness. Emergency Pepcid , Benadryl , Solu-Medrol  given.         Medication List     STOP taking these medications    furosemide  40 MG tablet Commonly known as: Lasix    labetalol  100 MG tablet Commonly known as: NORMODYNE        TAKE these medications    allopurinol  300 MG tablet Commonly known as: ZYLOPRIM  Take 0.5 tablets (150 mg total) by mouth daily.   aspirin  EC 81 MG tablet Take 81 mg by mouth daily. Swallow whole.   EYE HEALTH AREDS 2 PO Take 1 capsule by mouth 2 (two) times daily.   fluticasone  50 MCG/ACT nasal spray Commonly known as: FLONASE  Place 2 sprays into both nostrils daily.   Glucerna 1.0 Cal/Fiber Liqd Take 1 Bottle by mouth 2 (two) times daily between meals.   loratadine  10 MG tablet Commonly known as: CLARITIN  Take 1 tablet (10 mg total) by mouth daily.   omeprazole 40 MG capsule Commonly known as: PRILOSEC Take 40 mg by mouth daily.   oxyCODONE  5 MG immediate release tablet Commonly known as: Oxy IR/ROXICODONE  Take 1 tablet (5 mg total) by mouth every 6 (six) hours as needed for severe pain (pain score 7-10).   polyethylene glycol 17 g packet Commonly known as: MiraLax  Take 17 g by mouth daily as needed for mild constipation or moderate constipation.   prochlorperazine  10 MG tablet Commonly known as: COMPAZINE  Take 1 tablet (10 mg total) by mouth every 6 (six) hours as needed for nausea or vomiting.   rosuvastatin  10 MG tablet Commonly known as: CRESTOR  Take 10 mg by mouth every other day.   simethicone  80 MG chewable tablet Commonly known as:  MYLICON Chew 1 tablet (80 mg total) by mouth every 6 (six) hours as needed for flatulence.   SITagliptin  25 MG Tabs Take 12.5 mg by mouth daily at 6 (six) AM.   Vitamin D3 25 MCG (1000 UT) Caps Take 1,000 Units by mouth daily.         Discharge Exam: There were no vitals filed for this visit.  GENERAL:  Alert, pleasant, no acute distress  HEENT:   EOMI CARDIOVASCULAR:  RRR, no murmurs appreciated RESPIRATORY:  Clear to auscultation, no wheezing, rales, or rhonchi GASTROINTESTINAL:  Soft, nontender, nondistended EXTREMITIES:  No LE edema bilaterally NEURO:  No new focal deficits appreciated SKIN:  No rashes noted PSYCH:  Appropriate mood and affect    Condition at discharge: improving  The results of significant diagnostics from this hospitalization (including imaging, microbiology, ancillary and laboratory) are listed below for reference.   Imaging Studies: CT BIOPSY Result Date: 05/07/2024 EXAM: CT-guided core biopsy left paraaortic retroperitoneal adenopathy COMPARISON: PET CT 05/02/2024 CLINICAL HISTORY: Lymphadenopathy Previous biopsy of same target: No Complications: No immediate complications. Conscious Sedation: 1.5 mg Versed  IV, 75 mcg fentanyl  IV. Sedation Technique: Under physician supervision, Versed  and fentanyl  were administered IV for moderate sedation. Pulse oximetry, heart rate, and blood pressure were continuously monitored with an independent trained observer present. Physician face-to-face sedation time: 12 minutes. All CT scans at this facility use dose modulation, iterative reconstruction, and/or size based dosing when appropriate to reduce radiation dose to as low as reasonably achievable. Discussion: Time out performed including verification of patient, date of birth, procedure, and sidedness as appropriate. Informed written consent was obtained. The site was prepared and draped using maximal sterile barrier technique including cutaneous antisepsis. The patient was positioned Prone. Initial imaging was performed. Biopsy target: Organ or target location: Retroperitoneal adenopathy, left paraaortic. Laterality: Left. Maximal diameter: 2.8 cm. Other findings: None. Local anesthesia was administered. Under CT guidance, the 17 gauge core biopsy needle was advanced from a posterior approach to the margin of the lesion.  Coaxial 18 gauge core biopsy samples were obtained, submitted and in saline to surgical pathology. Biopsy details: Biopsy type: Core Coaxial needle: 17G Core needle device: Biopince Core needle size: 18G Number of core specimens: Multiple core specimens obtained. Fine needle aspiration performed: No. FNA needle size: N/A Number of FNA specimens: N/A On-site assessment of biopsy adequacy: No. Preliminary assessment of sample adequacy: N/A The biopsy needle was removed and a sterile dressing was applied. Tract closure: None Post-procedure imaging: Immediate post-biopsy imaging: Noncontrast CT Post-biopsy imaging findings: No hemorrhage or other apparent complication. Contrast: Contrast agent: None. Contrast volume: 0 mL. Estimated blood loss: Less than 10 mL. Specimens sent for evaluation. IMPRESSION: 1. Successful core biopsy of left paraaortic adenopathy. 2. No immediate complications. Electronically signed by: Katheleen Faes MD 05/07/2024 03:06 PM EST RP Workstation: [id number]   US  Paracentesis Result Date: 05/06/2024 INDICATION: [id number] Ascites [id number] 89 year old male. History of lymphoma with recurrent malignant ascites. Request is for therapeutic paracentesis EXAM: ULTRASOUND GUIDED THERAPEUTIC PARACENTESIS MEDICATIONS: Lidocaine  1% 10 mL COMPLICATIONS: None immediate. PROCEDURE: Informed written consent was obtained from the patient after a discussion of the risks, benefits and alternatives to treatment. A timeout was performed prior to the initiation of the procedure. Initial ultrasound scanning demonstrates a moderate amount of ascites within the right lower abdominal quadrant. The right lower abdomen was prepped and draped in the usual sterile fashion. 1% lidocaine  was used for local anesthesia. Following this, a 19 gauge, 7-cm, Yueh catheter was  introduced. An ultrasound image was saved for documentation purposes. The paracentesis was performed. The catheter was removed and a dressing was applied. The  patient tolerated the procedure well without immediate post procedural complication. FINDINGS: A total of approximately 3 L of straw-colored fluid was removed. IMPRESSION: Successful ultrasound-guided therapeutic paracentesis yielding 3.0 L of peritoneal fluid. Performed by Delon Beagle NP under supervision of Thom Hall, MD Electronically Signed   By: Thom Hall M.D.   On: 05/06/2024 10:39   US  Abdomen Limited RUQ (LIVER/GB) Result Date: 05/03/2024 EXAM: Right Upper Quadrant Abdominal Ultrasound 05/03/2024 08:42:15 AM TECHNIQUE: Real-time ultrasonography of the right upper quadrant of the abdomen was performed. COMPARISON: US  Abdomen 04/11/2024 and PET CT fusion study dated 05/02/2024. CLINICAL HISTORY: Transaminitis. FINDINGS: LIVER: Normal echogenicity. There is an ill-defined mixed echogenic mass present within the porta hepatitis measuring approximately 9.7 x 9.7 x 5.7 cm, corresponding with the metabolically avid mass noted on the previous PET CT. There is a cyst within the right hepatic lobe measuring approximately 12 mm in diameter. No intrahepatic biliary ductal dilatation. Hepatopetal flow in the portal vein. BILIARY SYSTEM: Gallbladder wall thickness measures 5.9 mm. There is gallbladder wall edema. There is sludge within the gallbladder. The common bile duct measures 3.0 mm. OTHER: There is mild ascites. IMPRESSION: 1. Ill-defined mixed echogenic mass within the porta hepatis measuring approximately 9.7 x 9.7 x 5.7 cm, corresponding with the metabolically avid mass noted on the previous PET CT. 2. Gallbladder sludge with wall edema/thickening (up to 5.9 mm). No focal tenderness over the gallbladder. Common bile duct measures 3 mm. 3. 12 mm right hepatic lobe cyst. 4. Mild ascites. Electronically signed by: Evalene Coho MD 05/03/2024 09:25 AM EST RP Workstation: [id number]   NM PET Image Initial (PI) Skull Base To Thigh Result Date: 05/03/2024 EXAM: PET AND CT SKULL BASE TO MID THIGH  05/02/2024 02:09:18 PM TECHNIQUE: RADIOPHARMACEUTICAL: 10.6 mCi F-18 FDG Uptake time 60 minutes. Glucose level 125 mg/dl. PET imaging was acquired from the base of the skull to the mid thighs. Non-contrast enhanced computed tomography was obtained for attenuation correction and anatomic localization. COMPARISON: CT chest, abdomen and pelvis from 04/05/2024 and 04/04/2024. CLINICAL HISTORY: Abdominal Mass. FINDINGS: HEAD AND NECK: Right orbit lobe prosthesis. Multiple tracer avid left cervical lymph nodes. Index node measures 6 mm with SUV max of 3.4. Tracer avid left supraclavicular lymph node measures 1.2 cm and has an SUV max of 7.0. CHEST: Tracer avid right paratracheal lymph node measures 7 mm with SUV max of 3.4, image 48. Within the posteromediastinum in the lower chest, there are multiple tracer-avid mediastinal lymph nodes. Along the left side of the distal descending thoracic aorta, a lymph node measures 1.1 cm with an SUV max of 11.0, image 75. Posterior to the left atrium, there is a lymph node which measures 1.3 cm with an SUV max of 7.6, axial image 64. Small bilateral pleural effusions identified. No tracer avid pulmonary nodule or mass identified. Aortic atherosclerosis. Coronary artery calcifications. Amorphous infiltrative soft tissue with Increased tracer uptake noted within the right and left cardiophrenic fat noted. Within the right cardiophrenic fat pad lymph node measures 1.7 cm with SUV max of 4.2, axial image 70. ABDOMEN AND PELVIS: Extensive tracer avid tumor is identified within the abdomen and pelvis with tracer-avid tumor involving the mesentery and retroperitoneum, and peritoneum. This includes: -soft tissue mass within the central mesentery measures 15.9 x 7.3 cm with SUV max of 13.6, axial image 102. -Infiltrative tumor within the  ventral abdomen measures 10.0 x 4.3 cm with SUV max of 12.2, axial image 112. -Left retroperitoneal lymph node measures 2.2 cm with SUV max of 15.3, axial  image 104. Right retroperitoneal tumor extends into the right perinephric space with SUV max measuring 11.9, axial image 108. -Infiltrative mass within the porta hepatic region extends around the gallbladder and along the falciform ligament with possible invasion into the umbilical vein. This measures 11.0 x 8.5 cm with SUV max of 12.9, image 83. -lymph node within the left inguinal region measures 1.1 cm with an SUV max of 6.0. - small to moderate volume abdominal pelvic ascites. Normal size spleen. No focal areas of increased uptake identified within the splenic parenchyma. The sigmoid colon demonstrates diverticulosis without evidence of diverticulitis. No bowel wall thickening, pericolonic stranding, abscess, or free air is noted. Physiologic activity within the gastrointestinal and genitourinary systems. BONES AND SOFT TISSUE: Multifocal tracer avid bone lesions are identified. The index lesion within the left iliac bone measures 1.7 cm with an SUV max of 12.2, axial image 125. Index lesion within the proximal left humerus measures 1.4 cm with SUV max of 6.7, axial image 43. The index lesion within the right side of the T3 vertebral body measures 1.3 cm with SUV max of 7.8. IMPRESSION: 1. Extensive tracer-avid tumor is identified within the neck, chest, abdomen, and pelvis. Imaging findings are compatible with biopsy proven lymphoma. 2. Tumor within the abdomen and pelvis involves the peritoneal, mesenteric, and retroperitoneal spaces with bulky mesenteric adenopathy. Tumor within the porta hepatic region invades the falciform ligament with invasion of the umbilical vein. 3. 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 Electronically signed by: Waddell Calk MD 05/03/2024 06:11 AM EST RP Workstation: [id number]   US  Paracentesis Result Date: 05/01/2024 INDICATION: 89 year old male. History of lymphoma with recurrent malignant ascites. Request is for therapeutic paracentesis  EXAM: ULTRASOUND GUIDED THERAPEUTIC RIGHT-SIDED PARACENTESIS MEDICATIONS: Lidocaine  1% 10 mL COMPLICATIONS: None immediate. PROCEDURE: Informed written consent was obtained from the patient after a discussion of the risks, benefits and alternatives to treatment. A timeout was performed prior to the initiation of the procedure. Initial ultrasound scanning demonstrates a moderate amount of ascites within the right lower abdominal quadrant. The right lower abdomen was prepped and draped in the usual sterile fashion. 1% lidocaine  was used for local anesthesia. Following this, a 19 gauge, 7-cm, Yueh catheter was introduced. An ultrasound image was saved for documentation purposes. The paracentesis was performed. The catheter was removed and a dressing was applied. The patient tolerated the procedure well without immediate post procedural complication. FINDINGS: A total of approximately 4 L of straw-colored fluid was removed. IMPRESSION: Successful ultrasound-guided therapy paracentesis yielding 4 liters of straw-colored peritoneal fluid. Performed by Delon Beagle NP Electronically Signed   By: Wilkie Lent M.D.   On: 05/01/2024 13:01   US  Paracentesis Result Date: 04/24/2024 INDICATION: Patient with recently diagnosed lymphoma, recurrent malignant ascites. Request for therapeutic paracentesis. EXAM: ULTRASOUND GUIDED THERAPEUTIC PARACENTESIS MEDICATIONS: 5 mL 1% lidocaine  COMPLICATIONS: None immediate. PROCEDURE: Informed written consent was obtained from the patient after a discussion of the risks, benefits and alternatives to treatment. A timeout was performed prior to the initiation of the procedure. Initial ultrasound scanning demonstrates a large amount of ascites within the right lower abdominal quadrant. The right lower abdomen was prepped and draped in the usual sterile fashion. 1% lidocaine  was used for local anesthesia. Following this, a 19 gauge, 7-cm, Yueh catheter was introduced. An ultrasound  image was  saved for documentation purposes. The paracentesis was performed. The catheter was removed and a dressing was applied. The patient tolerated the procedure well without immediate post procedural complication. FINDINGS: A total of approximately 4.0 L of serosanguineous fluid was removed. IMPRESSION: Successful ultrasound-guided paracentesis yielding 4.0 liters of peritoneal fluid. Performed by Clotilda Hesselbach, PA-C Electronically Signed   By: Ester Sides M.D.   On: 04/24/2024 16:19   US  CORE BIOPSY (LYMPH NODES) Result Date: 04/17/2024 INDICATION: Left groin lymphadenopathy. EXAM: ULTRASOUND GUIDED core needle  BIOPSY OF left inguinal region MEDICATIONS: None. ANESTHESIA/SEDATION: Fentanyl  1.5 mcg IV; Versed  50 mg IV Moderate Sedation Time:  10 The patient was continuously monitored during the procedure by the interventional radiology nurse under my direct supervision. PROCEDURE: The procedure, risks, benefits, and alternatives were explained to the patient. Questions regarding the procedure were encouraged and answered. The patient understands and consents to the procedure. The left groin was prepped with chlorhexidine  in a sterile fashion, and a sterile drape was applied covering the operative field. A sterile gown and sterile gloves were used for the procedure. Local anesthesia was provided with 1% Lidocaine . An introducer needle was advanced through a small incision made in the mid aspect of the left groin. The needle was advanced under ultrasound guidance until the needle tip was engaged within the abnormally enlarged lymph node. The stylet was removed and the BioPince needle was then advanced through the introducer needle until the BioPince needle was engaged within the lymph node tissue. A 2.3 cm core sample was obtained. Sample was placed in saline. A second sample was obtained in similar fashion. All needles removed from the patient. Sterile dressing applied. Final image demonstrates no  active hemorrhage. COMPLICATIONS: Satisfactory core needle biopsy of left inguinal region lymph node. Electronically Signed   By: Cordella Banner   On: 04/17/2024 15:24   US  Paracentesis Result Date: 04/17/2024 INDICATION: Patient with enlarging mesenteric mass concerning for lymphoma, new onset ascites. Request for diagnostic and therapeutic paracentesis. EXAM: ULTRASOUND GUIDED DIAGNOSTIC AND THERAPEUTIC PARACENTESIS MEDICATIONS: 6 mL 1% lidocaine  COMPLICATIONS: None immediate. PROCEDURE: Informed written consent was obtained from the patient after a discussion of the risks, benefits and alternatives to treatment. A timeout was performed prior to the initiation of the procedure. Initial ultrasound scanning demonstrates a moderate amount of ascites within the right lower abdominal quadrant. The right lower abdomen was prepped and draped in the usual sterile fashion. 1% lidocaine  was used for local anesthesia. Following this, a 19 gauge, 7-cm, Yueh catheter was introduced. An ultrasound image was saved for documentation purposes. The paracentesis was performed. The catheter was removed and a dressing was applied. The patient tolerated the procedure well without immediate post procedural complication. FINDINGS: A total of approximately 2.2 L of bloody fluid was removed. Samples were sent to the laboratory as requested by the clinical team. IMPRESSION: Successful ultrasound-guided paracentesis yielding 2.2 liters of peritoneal fluid. Performed by Clotilda Hesselbach, PA-C Electronically Signed   By: CHRISTELLA.  Shick M.D.   On: 04/17/2024 11:48   CT ABDOMEN PELVIS WO CONTRAST Result Date: 04/16/2024 EXAM: CT ABDOMEN AND PELVIS WITHOUT CONTRAST 04/15/2024 10:17:55 PM TECHNIQUE: CT of the abdomen and pelvis was performed without the administration of intravenous contrast. Multiplanar reformatted images are provided for review. Automated exposure control, iterative reconstruction, and/or weight-based adjustment of the mA/kV  was utilized to reduce the radiation dose to as low as reasonably achievable. COMPARISON: Comparison with the 04/04/2024 CLINICAL HISTORY: Abdominal pain, acute, nonlocalized. FINDINGS: LOWER CHEST: Trace  bilateral pleural effusions. LIVER: The liver is unremarkable. GALLBLADDER AND BILE DUCTS: Similar reactive gallbladder wall thickening. No biliary ductal dilatation. SPLEEN: No acute abnormality. PANCREAS: Unchanged ill-defined soft tissue about the pancreatic head. This was better assessed on prior CT with contrast. ADRENAL GLANDS: No acute abnormality. KIDNEYS, URETERS AND BLADDER: No stones in the kidneys or ureters. No hydronephrosis. No perinephric or periureteral stranding. Foley catheter in the decompressed bladder. GI AND BOWEL: Oral contrast is present within the stomach and small bowel. There is dilute contrast in the colon. Wall thickening about the terminal ileum and ascending and transverse colon is grossly similar to prior given differences in contrast administration. There is no bowel obstruction. PERITONEUM AND RETROPERITONEUM: Increased small to moderate abdominal pelvic ascites. Diffuse mesenteric stranding is similar. Redemonstrated ill-defined soft tissue density throughout the base of the mesentery. This was better evaluated with CT with IV contrast on 04/04/2024 but is grossly similar. No free air. VASCULATURE: Aorta is normal in caliber. Aortic atherosclerotic calcification. LYMPH NODES: Unchanged left periaortic lymphadenopathy measuring up to 1.7 cm on series 2 image 45. Similar enlargement of a left inguinal lymph node measuring 1.3 cm. Similar enlargement of a pericardial lymph node measuring 1.8 cm on series 2 image 14. REPRODUCTIVE ORGANS: No acute abnormality. BONES AND SOFT TISSUES: No acute osseous abnormality. No focal soft tissue abnormality. IMPRESSION: 1. Grossly similar ill-defined soft tissue throughout the base of the mesentery. 2. Increased small to moderate abdominopelvic  ascites since 11 / 12 / 25 . 3. Wall thickening about the terminal ileum and ascending and transverse colon, grossly similar to prior given differences in contrast administration. 4. Unchanged left periaortic, pericardiac, and left inguinal lymphadenopathy. Electronically signed by: Norman Gatlin MD 04/16/2024 02:38 AM EST RP Workstation: [id number]   US  RENAL Result Date: 04/14/2024 CLINICAL DATA:  Acute kidney injury EXAM: RENAL / URINARY TRACT ULTRASOUND COMPLETE COMPARISON:  CT abdomen pelvis dated 04/04/2024 FINDINGS: Right Kidney: Length = 11.5 cm Normal parenchymal echogenicity with preserved corticomedullary differentiation. No urinary tract dilation or shadowing calculi. The ureter is not seen. Left Kidney: Length = 11.1 cm Normal parenchymal echogenicity with preserved corticomedullary differentiation. No urinary tract dilation or shadowing calculi. The ureter is not seen. Bladder: Appears normal for degree of bladder distention. Other: Partially imaged small volume ascites. IMPRESSION: 1. No urinary tract dilation or shadowing calculi. Normal cortical echogenicity. 2. Partially imaged small volume ascites. Electronically Signed   By: Limin  Xu M.D.   On: 04/14/2024 15:42    Microbiology: Results for orders placed or performed during the hospital encounter of 05/02/24  MRSA Next Gen by PCR, Nasal     Status: None   Collection Time: 05/02/24  3:30 PM   Specimen: Nasal Mucosa; Nasal Swab  Result Value Ref Range Status   MRSA by PCR Next Gen NOT DETECTED NOT DETECTED Final    Comment: (NOTE) The GeneXpert MRSA Assay (FDA approved for NASAL specimens only), is one component of a comprehensive MRSA colonization surveillance program. It is not intended to diagnose MRSA infection nor to guide or monitor treatment for MRSA infections. Test performance is not FDA approved in patients less than 32 years old. Performed at [facility], [address], [place], [place] [address]      Labs: CBC: Recent Labs  Lab 05/06/24 1649 05/07/24 0436 05/07/24 1908  WBC 5.3 5.1 6.1  HGB 9.3* 8.7* 9.4*  HCT 29.7* 27.6* 31.0*  MCV 88.9 89.6 91.2  PLT 194 199 224   Basic Metabolic  Panel: Recent Labs  Lab 05/06/24 1649 05/07/24 0436 05/07/24 1908  NA 142 143  --   K 4.0 3.9  --   CL 107 107  --   CO2 24 25  --   GLUCOSE 139* 119*  --   BUN 30* 26*  --   CREATININE 0.91 0.82  --   CALCIUM  7.5* 7.4*  --   PHOS 3.6 3.2 3.2   Liver Function Tests: Recent Labs  Lab 05/06/24 1649 05/07/24 0436  AST 41 34  ALT 107* 81*  ALKPHOS 1,033* 815*  BILITOT 0.7 0.7  PROT 4.6* 4.4*  ALBUMIN  2.6* 2.7*   CBG: Recent Labs  Lab 05/11/24 2136 05/12/24 0734 05/12/24 1119 05/12/24 1613 05/12/24 1954  GLUCAP 118* 107* 131* 110* 143*    Discharge time spent: 26 minutes.  Length of inpatient stay: 11 days  Signed: Carliss LELON Canales, DO [facility] 05/13/2024         "

## 2024-05-13 NOTE — TOC Transition Note (Signed)
 Transition of Care [facility]) - Discharge Note   Patient Details  Name: Cody Palmer MRN: [id number] Date of Birth: 06-Jan-1933  Transition of [facility]) CM/SW Contact:  [name] Phone Number: 05/13/2024, 10:47 AM   Clinical Narrative:     Patient is DC today to [facility]. CSW provided nurse with room and report number. CSW reached out to daughter and had to leave a HIPAA VM regarding DC. CSW signing off.   Final next level of care: Skilled Nursing Facility Barriers to Discharge: Barriers Resolved   Patient Goals and CMS Choice Patient states their goals for this hospitalization and ongoing recovery are:: get better CMS Medicare.gov Compare Post Acute Care list provided to:: Patient Choice offered to / list presented to : Patient, Adult Children      Discharge Placement              Patient chooses bed at: [facility] Patient to be transferred to facility by: Staff Name of family member notified: Left daughter [name] a VM Patient and family notified of of transfer: 05/13/24  Discharge Plan and Services Additional resources added to the After Visit Summary for   In-house Referral: Clinical Social Work Discharge Planning Services: CM Consult                                 Social Drivers of Health (SDOH) Interventions SDOH Screenings   Food Insecurity: No Food Insecurity (05/02/2024)  Housing: Patient Declined (05/02/2024)  Transportation Needs: Patient Declined (05/02/2024)  Utilities: Patient Declined (05/02/2024)  Depression (PHQ2-9): Low Risk (05/02/2024)  Social Connections: Patient Declined (05/02/2024)  Recent Concern: Social Connections - Moderately Isolated (04/10/2024)  Tobacco Use: Medium Risk (05/06/2024)     Readmission Risk Interventions    05/13/2024   10:44 AM 04/19/2024    1:46 PM  Readmission Risk Prevention Plan  Transportation Screening Complete Complete  PCP or Specialist Appt within 3-5 Days  Not Complete  HRI or  Home Care Consult Complete Complete  Social Work Consult for Recovery Care Planning/Counseling Complete Complete  Palliative Care Screening Not Applicable Not Applicable  Medication Review Oceanographer) Complete Complete

## 2024-05-14 ENCOUNTER — Inpatient Hospital Stay: Admitting: Oncology

## 2024-05-14 ENCOUNTER — Inpatient Hospital Stay

## 2024-05-14 ENCOUNTER — Encounter: Payer: Self-pay | Admitting: Adult Health

## 2024-05-14 ENCOUNTER — Non-Acute Institutional Stay: Payer: Self-pay | Admitting: Adult Health

## 2024-05-14 ENCOUNTER — Other Ambulatory Visit: Payer: Self-pay | Admitting: Oncology

## 2024-05-14 VITALS — BP 137/67 | HR 91 | Temp 98.4°F | Resp 18 | Ht 68.0 in | Wt 189.0 lb

## 2024-05-14 VITALS — BP 132/64 | HR 97 | Temp 96.9°F | Resp 20

## 2024-05-14 DIAGNOSIS — N183 Chronic kidney disease, stage 3 unspecified: Secondary | ICD-10-CM | POA: Diagnosis not present

## 2024-05-14 DIAGNOSIS — C8590 Non-Hodgkin lymphoma, unspecified, unspecified site: Secondary | ICD-10-CM

## 2024-05-14 DIAGNOSIS — R600 Localized edema: Secondary | ICD-10-CM | POA: Diagnosis not present

## 2024-05-14 DIAGNOSIS — E1169 Type 2 diabetes mellitus with other specified complication: Secondary | ICD-10-CM | POA: Diagnosis not present

## 2024-05-14 DIAGNOSIS — C8299 Follicular lymphoma, unspecified, extranodal and solid organ sites: Secondary | ICD-10-CM

## 2024-05-14 DIAGNOSIS — N1832 Chronic kidney disease, stage 3b: Secondary | ICD-10-CM

## 2024-05-14 DIAGNOSIS — I129 Hypertensive chronic kidney disease with stage 1 through stage 4 chronic kidney disease, or unspecified chronic kidney disease: Secondary | ICD-10-CM

## 2024-05-14 DIAGNOSIS — E440 Moderate protein-calorie malnutrition: Secondary | ICD-10-CM

## 2024-05-14 DIAGNOSIS — J3089 Other allergic rhinitis: Secondary | ICD-10-CM | POA: Diagnosis not present

## 2024-05-14 DIAGNOSIS — Z7984 Long term (current) use of oral hypoglycemic drugs: Secondary | ICD-10-CM | POA: Diagnosis not present

## 2024-05-14 DIAGNOSIS — Z79620 Long term (current) use of immunosuppressive biologic: Secondary | ICD-10-CM | POA: Diagnosis not present

## 2024-05-14 DIAGNOSIS — C8295 Follicular lymphoma, unspecified, lymph nodes of inguinal region and lower limb: Secondary | ICD-10-CM | POA: Diagnosis present

## 2024-05-14 DIAGNOSIS — E785 Hyperlipidemia, unspecified: Secondary | ICD-10-CM

## 2024-05-14 DIAGNOSIS — E1122 Type 2 diabetes mellitus with diabetic chronic kidney disease: Secondary | ICD-10-CM

## 2024-05-14 DIAGNOSIS — Z5112 Encounter for antineoplastic immunotherapy: Secondary | ICD-10-CM | POA: Diagnosis present

## 2024-05-14 LAB — COMPREHENSIVE METABOLIC PANEL WITH GFR
ALT: 26 U/L (ref 0–44)
AST: 19 U/L (ref 15–41)
Albumin: 3 g/dL — ABNORMAL LOW (ref 3.5–5.0)
Alkaline Phosphatase: 330 U/L — ABNORMAL HIGH (ref 38–126)
Anion gap: 11 (ref 5–15)
BUN: 18 mg/dL (ref 8–23)
CO2: 24 mmol/L (ref 22–32)
Calcium: 7.9 mg/dL — ABNORMAL LOW (ref 8.9–10.3)
Chloride: 108 mmol/L (ref 98–111)
Creatinine, Ser: 1.16 mg/dL (ref 0.61–1.24)
GFR, Estimated: >60 mL/min
Glucose, Bld: 102 mg/dL — ABNORMAL HIGH (ref 70–99)
Potassium: 4.2 mmol/L (ref 3.5–5.1)
Sodium: 142 mmol/L (ref 135–145)
Total Bilirubin: 0.6 mg/dL (ref 0.0–1.2)
Total Protein: 4.8 g/dL — ABNORMAL LOW (ref 6.5–8.1)

## 2024-05-14 LAB — CBC WITH DIFFERENTIAL/PLATELET
Abs Immature Granulocytes: 0.03 K/uL (ref 0.00–0.07)
Basophils Absolute: 0 K/uL (ref 0.0–0.1)
Basophils Relative: 1 %
Eosinophils Absolute: 0.2 K/uL (ref 0.0–0.5)
Eosinophils Relative: 3 %
HCT: 29.4 % — ABNORMAL LOW (ref 39.0–52.0)
Hemoglobin: 9.2 g/dL — ABNORMAL LOW (ref 13.0–17.0)
Immature Granulocytes: 1 %
Lymphocytes Relative: 18 %
Lymphs Abs: 1.1 K/uL (ref 0.7–4.0)
MCH: 28 pg (ref 26.0–34.0)
MCHC: 31.3 g/dL (ref 30.0–36.0)
MCV: 89.6 fL (ref 80.0–100.0)
Monocytes Absolute: 0.6 K/uL (ref 0.1–1.0)
Monocytes Relative: 10 %
Neutro Abs: 4.3 K/uL (ref 1.7–7.7)
Neutrophils Relative %: 67 %
Platelets: 220 K/uL (ref 150–400)
RBC: 3.28 MIL/uL — ABNORMAL LOW (ref 4.22–5.81)
RDW: 18.6 % — ABNORMAL HIGH (ref 11.5–15.5)
WBC: 6.4 K/uL (ref 4.0–10.5)
nRBC: 0 % (ref 0.0–0.2)

## 2024-05-14 LAB — MAGNESIUM: Magnesium: 2 mg/dL (ref 1.7–2.4)

## 2024-05-14 LAB — SURGICAL PATHOLOGY

## 2024-05-14 MED ORDER — ACETAMINOPHEN 325 MG PO TABS
650.0000 mg | ORAL_TABLET | Freq: Once | ORAL | Status: AC
  Administered 2024-05-14: 650 mg via ORAL
  Filled 2024-05-14: qty 2

## 2024-05-14 MED ORDER — SODIUM CHLORIDE 0.9 % IV SOLN: INTRAVENOUS | Status: DC

## 2024-05-14 MED ORDER — SODIUM CHLORIDE 0.9 % IV SOLN
375.0000 mg/m2 | Freq: Once | INTRAVENOUS | Status: AC
  Administered 2024-05-14: 800 mg via INTRAVENOUS
  Filled 2024-05-14: qty 50

## 2024-05-14 MED ORDER — DIPHENHYDRAMINE HCL 50 MG/ML IJ SOLN
25.0000 mg | Freq: Once | INTRAMUSCULAR | Status: AC
  Administered 2024-05-14: 25 mg via INTRAVENOUS
  Filled 2024-05-14: qty 1

## 2024-05-14 MED ORDER — FAMOTIDINE IN NACL 20-0.9 MG/50ML-% IV SOLN
20.0000 mg | Freq: Once | INTRAVENOUS | Status: AC
  Administered 2024-05-14: 20 mg via INTRAVENOUS
  Filled 2024-05-14: qty 50

## 2024-05-14 MED ORDER — METHYLPREDNISOLONE SODIUM SUCC 125 MG IJ SOLR
125.0000 mg | Freq: Once | INTRAMUSCULAR | Status: AC
  Administered 2024-05-14: 125 mg via INTRAVENOUS
  Filled 2024-05-14: qty 2

## 2024-05-14 NOTE — Patient Instructions (Signed)
 [facility] [place] - A DEPT OF [facility]  Discharge Instructions: Thank you for choosing [facility] to provide your oncology and hematology care.  If you have a lab appointment with the Cancer Center - please note that after April 8th, 2024, all labs will be drawn in the cancer center.  You do not have to check in or register with the main entrance as you have in the past but will complete your check-in in the cancer center.  Wear comfortable clothing and clothing appropriate for easy access to any Portacath or PICC line.   We strive to give you quality time with your provider. You may need to reschedule your appointment if you arrive late (15 or more minutes).  Arriving late affects you and other patients whose appointments are after yours.  Also, if you miss three or more appointments without notifying the office, you may be dismissed from the clinic at the provider's discretion.      For prescription refill requests, have your pharmacy contact our office and allow 72 hours for refills to be completed.    Today you received the following chemotherapy and/or immunotherapy agents Rituxan      To help prevent nausea and vomiting after your treatment, we encourage you to take your nausea medication as directed.  BELOW ARE SYMPTOMS THAT SHOULD BE REPORTED IMMEDIATELY: *FEVER GREATER THAN 100.4 F (38 C) OR HIGHER *CHILLS OR SWEATING *NAUSEA AND VOMITING THAT IS NOT CONTROLLED WITH YOUR NAUSEA MEDICATION *UNUSUAL SHORTNESS OF BREATH *UNUSUAL BRUISING OR BLEEDING *URINARY PROBLEMS (pain or burning when urinating, or frequent urination) *BOWEL PROBLEMS (unusual diarrhea, constipation, pain near the anus) TENDERNESS IN MOUTH AND THROAT WITH OR WITHOUT PRESENCE OF ULCERS (sore throat, sores in mouth, or a toothache) UNUSUAL RASH, SWELLING OR PAIN  UNUSUAL VAGINAL DISCHARGE OR ITCHING   Items with * indicate a potential emergency and should be followed up  as soon as possible or go to the Emergency Department if any problems should occur.  Please show the CHEMOTHERAPY ALERT CARD or IMMUNOTHERAPY ALERT CARD at check-in to the Emergency Department and triage nurse.  Should you have questions after your visit or need to cancel or reschedule your appointment, please contact [facility] [place] - A DEPT OF Eligha Bridegroom [facility] [phone]  and follow the prompts.  Office hours are 8:00 a.m. to 4:30 p.m. Monday - Friday. Please note that voicemails left after 4:00 p.m. may not be returned until the following business day.  We are closed weekends and major holidays. You have access to a nurse at all times for urgent questions. Please call the main number to the clinic [phone] and follow the prompts.  For any non-urgent questions, you may also contact your provider using MyChart. We now offer e-Visits for anyone 77 and older to request care online for non-urgent symptoms. For details visit [email or web address].   Also download the MyChart app! Go to the app store, search "MyChart", open the app, select [place], and log in with your MyChart username and password.

## 2024-05-14 NOTE — Progress Notes (Unsigned)
 "  Hematology-Oncology Clinic Note  [name], [name], NP   Reason for Referral: Follicular lymphoma  Oncology History: I have reviewed his chart and materials related to his cancer extensively and collaborated history with the patient. 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 -03/27/2024: Immature Cell Panel: Metamyelocytes : 3, Myelocytes 2, Uncertain lineage: 13.  -04/04/2024: Peripheral Blood Flow Cytometry: Clonal B-cell population comprising 37% of the lymphocytes.  The cells are positive for CD19, CD20, CD22, CD11c and express lambda light chains.  -04/04/2024: LDH 1,128 -04/04/2024: CT [facility]: Interval increased ill-defined soft tissue density throughout the base of the mesentery with diffuse stranding of the mesenteric fat and new ascites. Findings are nonspecific, and potentially still inflammatory, but suspicious for lymphoma given the associated enlarged pericardiac, retroperitoneal and inguinal lymph nodes. Persistent wall thickening of the ascending and transverse colon with increased wall thickening of the terminal ileum. These findings could be secondary to infection, inflammatory bowel disease or lymphedema. No evidence of bowel obstruction or perforation. -04/05/2024: CT chest: No evidence of thoracic metastasis.  -04/15/2024: CT [facility]:  Grossly similar ill-defined soft tissue throughout the base of the mesentery. Increased small to moderate abdominopelvic ascites since 03/27/24. Wall thickening about the terminal ileum and ascending and transverse colon, grossly similar to prior given differences in contrast administration. Unchanged left periaortic, pericardiac, and left inguinal lymphadenopathy. -04/17/2024: Paracentesis yielding 2.2 liters of fluid.   Cytology: Lymphoma  Flow cytometry: Tight clonal cluster of CD10, CD19, CD20, and CD38 positive B cells comprising 54% of gated population. The tight clonal cluster of B cells shows dim light chain positivity of a clinically occult light chain restriction.  This is due to the presence of the clonal cluster very low/dim kappa/lambda borderline expression making definitive light chain restriction difficult to interpret.  -04/17/2024: Left groin lymph node biopsy.  Pathology: Follicular lymphoma. The cells of interest are CD20 positive B cells which coexpress CD10, BCL6 and aberrantly strongly Bcl-2. There is a relative loss of the follicular dendritic cell meshwork and the predominant CD23 positive cells are the cells of interest. The proliferation rate by Ki-67 is quite low (<5%). CD3, CD5 and CD43 highlight background small T cells. Cyclin D1 is negative in the cells of interest. CK AE1/AE3 is negative.  Flow Cytometry: Tight clonal cluster of CD10, CD19, CD20, and CD38 positive B cells comprising 88% of gated population. -04/17/2024: Uric Acid- 20.4. LDH- 1,263. Phosphorous- 6.4 -04/17/2024 - current: Patient requiring weekly paracentesis yielding multiple liters each time.  -04/18/2024: Hepatitis B S AB quant <3.5 -04/21/2024: Uric acid 7.4 -04/24/2024: Albumin - 3.0. AST- 47. ALT- 40. Alkaline Phosphatase- 152. Phosphorous- 2.9. -05/02/2024: CMP: AST- 453, ALT- 297, Alkaline Phosphatase- 1,402, Total bilirubin- 3.3.  -05/02/2024: Initial PET: Results showed extensive tracer avid tumor in the neck chest abdomen and pelvis consistent with proven lymphoma.  Tumor within the abdomen and pelvis involves the peritoneal and mesenteric and retroperitoneal spaces with bulky mesenteric adenopathy. - 05/03/2024: Received first dose of rituximab .   History of Presenting Illness: Cody Palmer is a 89 y.o. male referred today for follicular lymphoma by Maree Bracken D, DO. He is accompanied by his son and  daughter.   Patient has a medical history of hypertension, DM type II, CKD stage 3, and arthritis.   Since his last visit, he was started  on Rituxan  which he received in the hospital with good tolerance.  PET scan from 05/02/2024 showed extensive tracer avid tumor in the neck chest abdomen and pelvis consistent with proven lymphoma.  Tumor within the abdomen and pelvis involves the peritoneal and mesenteric and retroperitoneal spaces with bulky mesenteric adenopathy.  He has had multiple paracentesis most recently on 05/06/2024 with 3 L of fluid were removed.  Prior to that, he had 4 L removed on 05/01/2024, 4 L on 04/24/2024 and 2.2 L on 04/17/2024.   At the time of diagnosis, he presented with abdominal distention and imaging at that time showed diffuse lymphadenopathy with eccentric mass.  Biopsy of the groin lymph node was consistent with follicular lymphoma but patient had labs consistent with TLS at that time.  Patient later had recurrent ascites requiring paracentesis flow cytometry at that time was consistent with lymphoma followed by groin lymph node biopsy consistent with follicular lymphoma.    He is currently at the [facility] for physical rehab.  Patient was living by himself prior to this and was in very good functional status.  Medical History: Past Medical History:  Diagnosis Date   Arthritis    Cancer (HCC)    Skin cancer   Diabetes mellitus without complication (HCC)    HTN (hypertension)    Hypercholesteremia     Surgical history: Past Surgical History:  Procedure Laterality Date   CATARACT EXTRACTION W/PHACO Left 04/25/2016   Procedure: CATARACT EXTRACTION PHACO AND INTRAOCULAR LENS PLACEMENT LEFT EYE;  Surgeon: Cherene Mania, MD;  Location: [facility];  Service: Ophthalmology;  Laterality: Left;  CDE: 10.37   COLONOSCOPY N/A 04/05/2024   Procedure: COLONOSCOPY;  Surgeon: Cindie Carlin POUR, DO;  Location: [facility] END[facility] SU[facility];  Service: Endoscopy;  Laterality: N/A;    CYSTOSCOPY WITH BIOPSY N/A 08/13/2013   Procedure: CYSTOSCOPY WITH BLADDER BIOPSY;  Surgeon: Garnette Shack, MD;  Location: [facility];  Service: Urology;  Laterality: N/A;   ENUCLEATION Right    Removed in 1958   EYE SURGERY  1958   removal of eye   TOTAL KNEE ARTHROPLASTY Right 01/11/2013   Procedure: RIGHT TOTAL KNEE ARTHROPLASTY;  Surgeon: Taft FORBES Minerva, MD;  Location: [facility];  Service: Orthopedics;  Laterality: Right;   TOTAL KNEE ARTHROPLASTY Left 07/12/2022   Procedure: TOTAL KNEE ARTHROPLASTY;  Surgeon: Minerva Taft FORBES, MD;  Location: [facility];  Service: Orthopedics;  Laterality: Left;     Allergies:  is allergic to rituximab .  Medications:  Current Outpatient Medications  Medication Sig Dispense Refill   allopurinol  (ZYLOPRIM ) 300 MG tablet Take 0.5 tablets (150 mg total) by mouth daily.     aspirin  EC 81 MG tablet Take 81 mg by mouth daily. Swallow whole.     Cholecalciferol  (VITAMIN D3) 25 MCG (1000 UT) CAPS Take 1,000 Units by mouth daily.     fluticasone  (FLONASE ) 50 MCG/ACT nasal spray Place 2 sprays into both nostrils daily.     loratadine  (CLARITIN ) 10 MG tablet Take 1 tablet (10 mg total) by mouth daily.     Multiple Vitamins-Minerals (EYE HEALTH AREDS 2 PO) Take 1 capsule by mouth 2 (two) times daily.     Nutritional Supplements (GLUCERNA 1.0 CAL/FIBER) LIQD Take 1 Bottle by mouth 2 (two) times daily between meals.     omeprazole (PRILOSEC) 40 MG capsule Take 40 mg by mouth daily.     oxyCODONE  (OXY IR/ROXICODONE ) 5 MG immediate release tablet Take 1 tablet (5 mg total) by mouth every 6 (six) hours  as needed for severe pain (pain score 7-10). 30 tablet 0   polyethylene glycol (MIRALAX ) 17 g packet Take 17 g by mouth daily as needed for mild constipation or moderate constipation. 14 each 0   prochlorperazine  (COMPAZINE ) 10 MG tablet Take 1 tablet (10 mg total) by mouth every 6 (six) hours as needed for nausea or vomiting. 30 tablet 0   rosuvastatin  (CRESTOR ) 10 MG  tablet Take 10 mg by mouth every other day.     simethicone  (MYLICON) 80 MG chewable tablet Chew 1 tablet (80 mg total) by mouth every 6 (six) hours as needed for flatulence. 30 tablet 0   SITagliptin  25 MG TABS Take 12.5 mg by mouth daily at 6 (six) AM.     No current facility-administered medications for this visit.     Physical Examination: ECOG PERFORMANCE STATUS: 2 - Symptomatic, <50% confined to bed  There were no vitals filed for this visit.  There were no vitals filed for this visit.   Physical Exam Constitutional:      Appearance: Normal appearance.  Cardiovascular:     Rate and Rhythm: Normal rate and regular rhythm.  Pulmonary:     Effort: Pulmonary effort is normal.     Breath sounds: Normal breath sounds.  Abdominal:     General: Bowel sounds are normal.     Palpations: Abdomen is soft.  Musculoskeletal:        General: No swelling. Normal range of motion.  Neurological:     Mental Status: He is alert and oriented to person, place, and time. Mental status is at baseline.       Laboratory Data: I have reviewed the data as listed Lab Results  Component Value Date   WBC 6.1 05/07/2024   HGB 9.4 (L) 05/07/2024   HCT 31.0 (L) 05/07/2024   MCV 91.2 05/07/2024   PLT 224 05/07/2024      Chemistry      Component Value Date/Time   NA 143 05/07/2024 0436   NA 139 03/27/2024 1513   K 3.9 05/07/2024 0436   CL 107 05/07/2024 0436   CO2 25 05/07/2024 0436   BUN 26 (H) 05/07/2024 0436   BUN 32 (H) 03/27/2024 1513   CREATININE 0.82 05/07/2024 0436      Component Value Date/Time   CALCIUM  7.4 (L) 05/07/2024 0436   ALKPHOS 815 (H) 05/07/2024 0436   AST 34 05/07/2024 0436   ALT 81 (H) 05/07/2024 0436   BILITOT 0.7 05/07/2024 0436   BILITOT 0.7 03/27/2024 1513       Latest Reference Range & Units 05/02/24 13:49  Phosphorus 2.5 - 4.6 mg/dL 3.8  Magnesium  1.7 - 2.4 mg/dL 2.0  Uric Acid, Serum 3.7 - 8.6 mg/dL 8.0  LDH 894 - 764 U/L 1,517 (H)  (H): Data is  abnormally high  Radiographic Studies: I have personally reviewed the radiological images as listed and agreed with the findings in the report.  EXAM: CT ABDOMEN AND PELVIS WITHOUT CONTRAST 04/15/2024 10:17:55 PM   TECHNIQUE: CT of the abdomen and pelvis was performed without the administration of intravenous contrast. Multiplanar reformatted images are provided for review. Automated exposure control, iterative reconstruction, and/or weight-based adjustment of the mA/kV was utilized to reduce the radiation dose to as low as reasonably achievable.   COMPARISON: Comparison with the 04/04/2024   CLINICAL HISTORY: Abdominal pain, acute, nonlocalized.   FINDINGS:   LOWER CHEST: Trace bilateral pleural effusions.   LIVER: The liver is unremarkable.   GALLBLADDER  AND BILE DUCTS: Similar reactive gallbladder wall thickening. No biliary ductal dilatation.   SPLEEN: No acute abnormality.   PANCREAS: Unchanged ill-defined soft tissue about the pancreatic head. This was better assessed on prior CT with contrast.   ADRENAL GLANDS: No acute abnormality.   KIDNEYS, URETERS AND BLADDER: No stones in the kidneys or ureters. No hydronephrosis. No perinephric or periureteral stranding. Foley catheter in the decompressed bladder.   GI AND BOWEL: Oral contrast is present within the stomach and small bowel. There is dilute contrast in the colon. Wall thickening about the terminal ileum and ascending and transverse colon is grossly similar to prior given differences in contrast administration. There is no bowel obstruction.   PERITONEUM AND RETROPERITONEUM: Increased small to moderate abdominal pelvic ascites. Diffuse mesenteric stranding is similar. Redemonstrated ill-defined soft tissue density throughout the base of the mesentery. This was better evaluated with CT with IV contrast on 04/04/2024 but is grossly similar. No free air.   VASCULATURE: Aorta is normal in caliber.  Aortic atherosclerotic calcification.   LYMPH NODES: Unchanged left periaortic lymphadenopathy measuring up to 1.7 cm on series 2 image 45. Similar enlargement of a left inguinal lymph node measuring 1.3 cm. Similar enlargement of a pericardial lymph node measuring 1.8 cm on series 2 image 14.   REPRODUCTIVE ORGANS: No acute abnormality.   BONES AND SOFT TISSUES: No acute osseous abnormality. No focal soft tissue abnormality.   IMPRESSION: 1. Grossly similar ill-defined soft tissue throughout the base of the mesentery. 2. Increased small to moderate abdominopelvic ascites since 11 / 12 / 25 . 3. Wall thickening about the terminal ileum and ascending and transverse colon, grossly similar to prior given differences in contrast administration. 4. Unchanged left periaortic, pericardiac, and left inguinal lymphadenopathy.   Electronically signed by: Norman Gatlin MD 04/16/2024 02:38 AM EST RP Workstation: [id number]  CT CHEST WITH CONTRAST 04/05/2024 03:37:22 PM   TECHNIQUE: CT of the chest was performed with the administration of 75 mL of iohexol  (OMNIPAQUE ) 300 MG/ML solution. Multiplanar reformatted images are provided for review. Automated exposure control, iterative reconstruction, and/or weight based adjustment of the mA/kV was utilized to reduce the radiation dose to as low as reasonably achievable.   COMPARISON: CT 04/04/2024 and CT abdomen and pelvis 1 day prior.   CLINICAL HISTORY: Lymphadenopathy on recent CT abdomen - needs CT chest to assess for further lymphadenopathy/possible biopsy in IR. * Tracking Code: BO *   FINDINGS:   MEDIASTINUM: Heart is unremarkable. No pericardial fluid. The central airways are clear.   LYMPH NODES: Bilateral small axillary lymph nodes. No Supraclavicular lymph nodes. No enlargement of the mediastinal lymph nodes. No hilar lymphadenopathy.   LUNGS AND PLEURA: No focal consolidation or pulmonary edema. No pleural effusion or  pneumothorax. No suspicious pulmonary nodules. Mild thickening along the pleural surface of the fissures.   SOFT TISSUES/BONES: No aggressive osseous lesion. No acute abnormality of the soft tissues.   UPPER ABDOMEN: Limited view of the upper abdomen demonstrates infiltrative process in the upper abdominal mesentery as seen on recent CT of the abdomen and pelvis. Findings suggestive of carcinomatosis. A small enhancing lesion in the right hepatic lobe on image 119 is also described on comparison CT.   IMPRESSION: 1. No evidence of thoracic metastasis. 2. Infiltrative process in the upper abdominal mesentery suggestive of peritoneal carcinomatosis. 3. See CT abdomen and pelvis 1 day prior.   Electronically signed by: Norleen Boxer MD 04/05/2024 03:54 PM EST RP Workstation: [id number]  ASSESSMENT &  PLAN:  Patient is a 89 y.o. male presenting for follicular lymphoma and treatment  Assessment and Plan  Non-Hodgkin lymphoma/follicular lymphoma Likely stage IV considering diffuse lymphadenopathy and mesenteric mass. Even though biopsy is consistent with follicular lymphoma, patient has been behaving like an aggressive lymphoma.  - Labs reviewed today:CMP: Potassium: 3.3, creatinine: 1.34, AST: 453, ALT: 297, total bilirubin: 3.3, alkaline phosphatase: 1402, LDH: 1517.  CBC: Hemoglobin: 10.7, platelets and WBC normal - Considering the above labs, will start rituximab  treatment sooner. - With the extent of disease and risk of TLS, will consider doing inpatient rituximab  tomorrow.  Will contact hospitalist team for admission to the hospital. - Recommend doing TLS labs-CBC with differential, CMP, phosphorus, LDH, uric acid every 12 hours. - Will order rituximab  for tomorrow.  Plan to start at 8:30 AM -Previous hepatitis panel was negative -Will consider starting steroids based on patient's status tomorrow. - Will await on PET scan results. - Can consider rebiopsy of a higher SUV area to  rule out DLBCL - If all the path is consistent with follicular lymphoma, considering high tumor burden will add Bendamustine outpatient. - Hematology will closely follow-up while inpatient  Return to clinic after discharge for follow-up.  Ascites Likely secondary to lymphoma -Continue symptomatic paracentesis.  Please make sure to send flow cytometry on the specimen.  Elevated liver enzymes Likely secondary to liver involvement with lymphoma versus lymphadenopathy  - Continue to monitor for now   No orders of the defined types were placed in this encounter.   I spent *** minutes dedicated to the care of this patient (face-to-face and non-face-to-face) on the date of the encounter to include what is described in the assessment and plan.   Delon FORBES Hope, NP 12/30/20258:24 AM "

## 2024-05-14 NOTE — Progress Notes (Unsigned)
 " Location:  [facility] Room Number: South/159/P Place of Service:  SNF (31)   CODE STATUS: DNR  Allergies[1]  Chief Complaint  Patient presents with   Hospitalization Follow-up    HPI:  He is a 89 year old man who has been hospitalized from 05-02-24 through 05-13-24. His past medical history includes: ckd stage 3a; hypertension; hyperlipidemia; type 2 diabetes mellitus. He has had a recent hospitalization due to [facility] with workup significant for non-hodgkin's lymphoma.  He received rituximab  on 05-03-24. He is followed by oncology. There was recurring ascites abnormal liver function likely associated with obstruction /hepatic stasis due to ongoing lymphoma. Hie liver function tests did improve.  He is here for short term rehab with his goal to return back home. He will continue to be followed for his chronic illnesses including:  CKD stage 3 due to type 2 diabetes mellitus:  Type 2 diabetes mellitus with stage 3b chronic kidney disease without long term current use of insulin :  Hyperlipidemia associated with type 2 diabetes mellitus:    Past Medical History:  Diagnosis Date   Arthritis    Cancer (HCC)    Skin cancer   Diabetes mellitus without complication (HCC)    HTN (hypertension)    Hypercholesteremia     Past Surgical History:  Procedure Laterality Date   CATARACT EXTRACTION W/PHACO Left 04/25/2016   Procedure: CATARACT EXTRACTION PHACO AND INTRAOCULAR LENS PLACEMENT LEFT EYE;  Surgeon: Cherene Mania, MD;  Location: [facility] ORS;  Service: Ophthalmology;  Laterality: Left;  CDE: 10.37   COLONOSCOPY N/A 04/05/2024   Procedure: COLONOSCOPY;  Surgeon: Cindie Carlin POUR, DO;  Location: [facility] ENDO SUITE;  Service: Endoscopy;  Laterality: N/A;   CYSTOSCOPY WITH BIOPSY N/A 08/13/2013   Procedure: CYSTOSCOPY WITH BLADDER BIOPSY;  Surgeon: Garnette Shack, MD;  Location: [facility]S;  Service: Urology;  Laterality: N/A;   ENUCLEATION Right    Removed in 1958   EYE SURGERY   1958   removal of eye   TOTAL KNEE ARTHROPLASTY Right 01/11/2013   Procedure: RIGHT TOTAL KNEE ARTHROPLASTY;  Surgeon: Taft FORBES Minerva, MD;  Location: [facility] ORS;  Service: Orthopedics;  Laterality: Right;   TOTAL KNEE ARTHROPLASTY Left 07/12/2022   Procedure: TOTAL KNEE ARTHROPLASTY;  Surgeon: Minerva Taft FORBES, MD;  Location: [facility];  Service: Orthopedics;  Laterality: Left;    Social History   Socioeconomic History   Marital status: Widowed    Spouse name: Not on file   Number of children: Not on file   Years of education: Not on file   Highest education level: Not on file  Occupational History   Not on file  Tobacco Use   Smoking status: Former    Current packs/day: 0.00    Average packs/day: 1 pack/day for 8.0 years (8.0 ttl pk-yrs)    Types: Cigarettes    Start date: 01/07/1957    Quit date: 01/07/1965    Years since quitting: 59.3   Smokeless tobacco: Never  Vaping Use   Vaping status: Never Used  Substance and Sexual Activity   Alcohol use: No   Drug use: No   Sexual activity: Yes    Birth control/protection: None  Other Topics Concern   Not on file  Social History Narrative   Not on file   Social Drivers of Health   Tobacco Use: Medium Risk (05/14/2024)   Patient History    Smoking Tobacco Use: Former    Smokeless Tobacco Use: Never    Passive  Exposure: Not on file  Financial Resource Strain: Not on file  Food Insecurity: No Food Insecurity (05/02/2024)   Epic    Worried About Programme Researcher, Broadcasting/film/video in the Last Year: Never true    Ran Out of Food in the Last Year: Never true  Transportation Needs: Patient Declined (05/02/2024)   Epic    Lack of Transportation (Medical): Patient declined    Lack of Transportation (Non-Medical): Patient declined  Physical Activity: Not on file  Stress: Not on file  Social Connections: Patient Declined (05/02/2024)   Social Connection and Isolation Panel    Frequency of Communication with Friends and Family: Patient declined     Frequency of Social Gatherings with Friends and Family: Patient declined    Attends Religious Services: Patient declined    Database Administrator or Organizations: Patient declined    Attends Banker Meetings: Patient declined    Marital Status: Patient declined  Recent Concern: Social Connections - Moderately Isolated (04/10/2024)   Social Connection and Isolation Panel    Frequency of Communication with Friends and Family: More than three times a week    Frequency of Social Gatherings with Friends and Family: More than three times a week    Attends Religious Services: 1 to 4 times per year    Active Member of [employer] or Organizations: No    Attends Banker Meetings: Never    Marital Status: Widowed  Intimate Partner Violence: Patient Declined (05/02/2024)   Epic    Fear of Current or Ex-Partner: Patient declined    Emotionally Abused: Patient declined    Physically Abused: Patient declined    Sexually Abused: Patient declined  Depression (PHQ2-9): Low Risk (05/14/2024)   Depression (PHQ2-9)    PHQ-2 Score: 0  Alcohol Screen: Not on file  Housing: Patient Declined (05/02/2024)   Epic    Unable to Pay for Housing in the Last Year: Patient declined    Number of Times Moved in the Last Year: Not on file    Homeless in the Last Year: Patient declined  Utilities: Patient Declined (05/02/2024)   Epic    Threatened with loss of utilities: Patient declined  Health Literacy: Not on file   Family History  Problem Relation Age of Onset   Arthritis Unknown    Cancer Unknown    Diabetes Unknown       VITAL SIGNS BP (!) 116/52   Pulse 77   Temp 97.7 F (36.5 C)   Resp 19   Ht 5' 9 (1.753 m)   Wt 199 lb 3.2 oz (90.4 kg)   SpO2 97%   BMI 29.42 kg/m   Outpatient Encounter Medications as of 05/14/2024  Medication Sig   allopurinol  (ZYLOPRIM ) 300 MG tablet Take 0.5 tablets (150 mg total) by mouth daily.   aspirin  EC 81 MG tablet Take 81 mg by mouth  daily. Swallow whole.   Cholecalciferol  (VITAMIN D3) 25 MCG (1000 UT) CAPS Take 1,000 Units by mouth daily.   fluticasone  (FLONASE ) 50 MCG/ACT nasal spray Place 2 sprays into both nostrils daily.   loratadine  (CLARITIN ) 10 MG tablet Take 1 tablet (10 mg total) by mouth daily.   Multiple Vitamins-Minerals (EYE HEALTH AREDS 2 PO) Take 1 capsule by mouth 2 (two) times daily.   Nutritional Supplements (GLUCERNA 1.0 CAL/FIBER) LIQD Take 1 Bottle by mouth 2 (two) times daily between meals.   Nutritional Supplements (PROSOURCE) LIQD Take 30 mLs by mouth 3 (three) times daily.  omeprazole (PRILOSEC) 40 MG capsule Take 40 mg by mouth daily.   oxyCODONE  (OXY IR/ROXICODONE ) 5 MG immediate release tablet Take 1 tablet (5 mg total) by mouth every 6 (six) hours as needed for severe pain (pain score 7-10).   polyethylene glycol (MIRALAX ) 17 g packet Take 17 g by mouth daily as needed for mild constipation or moderate constipation.   prochlorperazine  (COMPAZINE ) 10 MG tablet Take 1 tablet (10 mg total) by mouth every 6 (six) hours as needed for nausea or vomiting.   rosuvastatin  (CRESTOR ) 10 MG tablet Take 10 mg by mouth every other day.   simethicone  (MYLICON) 80 MG chewable tablet Chew 1 tablet (80 mg total) by mouth every 6 (six) hours as needed for flatulence.   SITagliptin  25 MG TABS Take 12.5 mg by mouth daily at 6 (six) AM.   [DISCONTINUED] 0.9 %  sodium chloride  infusion    No facility-administered encounter medications on file as of 05/14/2024.     SIGNIFICANT DIAGNOSTIC EXAMS  LABS:   04-10-24: wbc 5.0; hgb 9.3; hct 83.8 plt 153; glucose 102; bun 60; creat 2.45; k+ 5.0; na++ 134; ca 8. 3; gfr 25; protein 5.6; albumin  3.6; ast 70 alt 50 04-13-24: glucose 81; bun 43; creat 1.53; k+ 4.5; na++ 139; ca 8.3 gfr 43 04-16-24: glucose 91; bun 59; creat 2.35; k+ 5.2; na++ 137; ca 8.2 gfr 26; phos 5.3 albumin  3.2 LDH 1249.0 04-17-24: uric acid 20.4 04-21-24: uric acid 7.4 04-24-24: wbc 7.7; hgb 9.2; hct  29.9; mcv 90.3 plt 213; glucose 93; bun 51; creat 1.27; k+ 3.3; na++ 144; ca 7.8 gfr 54; alk phos 152; LDH 996 05-14-24: wbc 6.4; hgb 9.2; hct 29.4 plt 220; glucose 102; bun 18; creat 1.16; k+ 4.7; na++ 142; ca 7.9 gfr >60; protein 4.8 albumin  3.0; alk phos 330; mag 2.0   Review of Systems  Constitutional:  Negative for malaise/fatigue.  Respiratory:  Negative for cough and shortness of breath.   Cardiovascular:  Negative for chest pain, palpitations and leg swelling.  Gastrointestinal:  Negative for abdominal pain, constipation and heartburn.  Musculoskeletal:  Negative for back pain, joint pain and myalgias.  Skin: Negative.   Neurological:  Negative for dizziness.  Psychiatric/Behavioral:  The patient is not nervous/anxious.     Physical Exam Constitutional:      General: He is not in acute distress.    Appearance: He is well-developed. He is not diaphoretic.  Neck:     Thyroid: No thyromegaly.  Cardiovascular:     Rate and Rhythm: Normal rate and regular rhythm.     Pulses: Normal pulses.     Heart sounds: Murmur heard.  Pulmonary:     Effort: Pulmonary effort is normal. No respiratory distress.     Breath sounds: Normal breath sounds.  Abdominal:     General: Bowel sounds are normal. There is distension.     Palpations: Abdomen is soft.     Tenderness: There is no abdominal tenderness.  Musculoskeletal:        General: Normal range of motion.     Cervical back: Neck supple.     Right lower leg: Edema present.     Left lower leg: Edema present.  Lymphadenopathy:     Cervical: No cervical adenopathy.  Skin:    General: Skin is warm and dry.  Neurological:     Mental Status: He is alert. Mental status is at baseline.  Psychiatric:        Mood and Affect: Mood normal.  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 Failure to thrive in adult: has been in the hospital recently twice. His weight is 199 pounds  3. CKD stage 3 due to type 2 diabetes mellitus: bun 18; creat 1.16; gfr  >60  4. Type 2 diabetes mellitus with stage 3b chronic kidney disease without long term current use of insulin : will continue januvia  12.5 mg daily   5. Hyperlipidemia associated with type 2 diabetes mellitus: will continue crestor  10 mg daily   6. GERD without esophagitis: will continue prilosec 40 mg daily   7. Chronic non-seasonal allergic rhinitis: will continue flonase  2 sprays daily and claritin  10 mg daily   8. Bilateral lower extremity edema: is off lasix    9. Hypertension associated with stage 3b chronic kidney disease due type 2 diabetes mellitus: b/p 116/52 will monitor  10.  Chronic constipation: will continue miralax  daily as needed  11. Chronic gout due to renal impairment: will continue allopurinol  150 mg  daily   12. Severe protein calorie malnutrition: is on prosource 30 mL three times daily; and supplements twice daily.            Barnie Seip NP [facility]   call [phone]      [1]  Allergies Allergen Reactions   Rituximab  Shortness Of Breath and Cough    Patient had hypersensitivity reaction to rituximab  which included SHOB, cough, throat tightness. Emergency Pepcid , Benadryl , Solu-Medrol  given.    "

## 2024-05-14 NOTE — Progress Notes (Signed)
 Patient presents today for Rituxan  infusion per providers order.  Vital signs and labs reviewed by NP.  Per Delon Hope NP Patient okay for treatment.  Peripheral IV started and blood return noted pre and post infusion.  Treatment given today per MD orders.  Stable during infusion without adverse affects.  Vital signs stable.  No complaints at this time.  Discharge from clinic ambulatory in stable condition.  Alert and oriented X 3.  Follow up with [facility] as scheduled.

## 2024-05-15 ENCOUNTER — Encounter: Payer: Self-pay | Admitting: Oncology

## 2024-05-15 ENCOUNTER — Other Ambulatory Visit: Payer: Self-pay | Admitting: Oncology

## 2024-05-15 DIAGNOSIS — C8299 Follicular lymphoma, unspecified, extranodal and solid organ sites: Secondary | ICD-10-CM

## 2024-05-21 ENCOUNTER — Inpatient Hospital Stay

## 2024-05-21 ENCOUNTER — Inpatient Hospital Stay: Attending: Oncology

## 2024-05-21 ENCOUNTER — Inpatient Hospital Stay: Admitting: Oncology

## 2024-05-21 VITALS — BP 153/82 | HR 89 | Temp 96.6°F | Resp 18

## 2024-05-21 VITALS — BP 145/70 | HR 95 | Temp 99.1°F | Resp 18 | Ht 69.0 in | Wt 192.6 lb

## 2024-05-21 DIAGNOSIS — C8295 Follicular lymphoma, unspecified, lymph nodes of inguinal region and lower limb: Secondary | ICD-10-CM | POA: Insufficient documentation

## 2024-05-21 DIAGNOSIS — C8299 Follicular lymphoma, unspecified, extranodal and solid organ sites: Secondary | ICD-10-CM

## 2024-05-21 DIAGNOSIS — R748 Abnormal levels of other serum enzymes: Secondary | ICD-10-CM

## 2024-05-21 DIAGNOSIS — R600 Localized edema: Secondary | ICD-10-CM

## 2024-05-21 DIAGNOSIS — J90 Pleural effusion, not elsewhere classified: Secondary | ICD-10-CM | POA: Diagnosis not present

## 2024-05-21 DIAGNOSIS — Z79620 Long term (current) use of immunosuppressive biologic: Secondary | ICD-10-CM | POA: Diagnosis not present

## 2024-05-21 DIAGNOSIS — Z5112 Encounter for antineoplastic immunotherapy: Secondary | ICD-10-CM | POA: Diagnosis present

## 2024-05-21 DIAGNOSIS — E876 Hypokalemia: Secondary | ICD-10-CM

## 2024-05-21 DIAGNOSIS — R180 Malignant ascites: Secondary | ICD-10-CM | POA: Diagnosis not present

## 2024-05-21 LAB — CBC WITH DIFFERENTIAL/PLATELET
Abs Immature Granulocytes: 0.03 K/uL (ref 0.00–0.07)
Basophils Absolute: 0 K/uL (ref 0.0–0.1)
Basophils Relative: 0 %
Eosinophils Absolute: 0.2 K/uL (ref 0.0–0.5)
Eosinophils Relative: 3 %
HCT: 29.4 % — ABNORMAL LOW (ref 39.0–52.0)
Hemoglobin: 9.1 g/dL — ABNORMAL LOW (ref 13.0–17.0)
Immature Granulocytes: 0 %
Lymphocytes Relative: 19 %
Lymphs Abs: 1.3 K/uL (ref 0.7–4.0)
MCH: 27.2 pg (ref 26.0–34.0)
MCHC: 31 g/dL (ref 30.0–36.0)
MCV: 88 fL (ref 80.0–100.0)
Monocytes Absolute: 0.6 K/uL (ref 0.1–1.0)
Monocytes Relative: 8 %
Neutro Abs: 4.6 K/uL (ref 1.7–7.7)
Neutrophils Relative %: 70 %
Platelets: 187 K/uL (ref 150–400)
RBC: 3.34 MIL/uL — ABNORMAL LOW (ref 4.22–5.81)
RDW: 17.7 % — ABNORMAL HIGH (ref 11.5–15.5)
WBC: 6.7 K/uL (ref 4.0–10.5)
nRBC: 0 % (ref 0.0–0.2)

## 2024-05-21 LAB — COMPREHENSIVE METABOLIC PANEL WITH GFR
ALT: 18 U/L (ref 0–44)
AST: 26 U/L (ref 15–41)
Albumin: 3 g/dL — ABNORMAL LOW (ref 3.5–5.0)
Alkaline Phosphatase: 210 U/L — ABNORMAL HIGH (ref 38–126)
Anion gap: 7 (ref 5–15)
BUN: 12 mg/dL (ref 8–23)
CO2: 30 mmol/L (ref 22–32)
Calcium: 7.6 mg/dL — ABNORMAL LOW (ref 8.9–10.3)
Chloride: 105 mmol/L (ref 98–111)
Creatinine, Ser: 0.78 mg/dL (ref 0.61–1.24)
GFR, Estimated: >60 mL/min
Glucose, Bld: 137 mg/dL — ABNORMAL HIGH (ref 70–99)
Potassium: 3.1 mmol/L — ABNORMAL LOW (ref 3.5–5.1)
Sodium: 142 mmol/L (ref 135–145)
Total Bilirubin: 0.6 mg/dL (ref 0.0–1.2)
Total Protein: 4.9 g/dL — ABNORMAL LOW (ref 6.5–8.1)

## 2024-05-21 LAB — MAGNESIUM: Magnesium: 1.6 mg/dL — ABNORMAL LOW (ref 1.7–2.4)

## 2024-05-21 MED ORDER — MAGNESIUM SULFATE 2 GM/50ML IV SOLN
2.0000 g | Freq: Once | INTRAVENOUS | Status: AC
  Administered 2024-05-21: 2 g via INTRAVENOUS
  Filled 2024-05-21: qty 50

## 2024-05-21 MED ORDER — FAMOTIDINE IN NACL 20-0.9 MG/50ML-% IV SOLN
20.0000 mg | Freq: Once | INTRAVENOUS | Status: AC
  Administered 2024-05-21: 20 mg via INTRAVENOUS
  Filled 2024-05-21: qty 50

## 2024-05-21 MED ORDER — FUROSEMIDE 40 MG PO TABS: 40.0000 mg | ORAL_TABLET | Freq: Every day | ORAL | 3 refills | Status: DC | PRN

## 2024-05-21 MED ORDER — DIPHENHYDRAMINE HCL 50 MG/ML IJ SOLN
25.0000 mg | Freq: Once | INTRAMUSCULAR | Status: AC
  Administered 2024-05-21: 25 mg via INTRAVENOUS
  Filled 2024-05-21: qty 1

## 2024-05-21 MED ORDER — SODIUM CHLORIDE 0.9 % IV SOLN: INTRAVENOUS | Status: DC

## 2024-05-21 MED ORDER — POTASSIUM CHLORIDE CRYS ER 20 MEQ PO TBCR
40.0000 meq | EXTENDED_RELEASE_TABLET | Freq: Once | ORAL | Status: AC
  Administered 2024-05-21: 40 meq via ORAL
  Filled 2024-05-21: qty 2

## 2024-05-21 MED ORDER — ACETAMINOPHEN 325 MG PO TABS
650.0000 mg | ORAL_TABLET | Freq: Once | ORAL | Status: AC
  Administered 2024-05-21: 650 mg via ORAL
  Filled 2024-05-21: qty 2

## 2024-05-21 MED ORDER — METHYLPREDNISOLONE SODIUM SUCC 125 MG IJ SOLR
125.0000 mg | Freq: Once | INTRAMUSCULAR | Status: AC
  Administered 2024-05-21: 125 mg via INTRAVENOUS
  Filled 2024-05-21: qty 2

## 2024-05-21 MED ORDER — SODIUM CHLORIDE 0.9 % IV SOLN
375.0000 mg/m2 | Freq: Once | INTRAVENOUS | Status: AC
  Administered 2024-05-21: 800 mg via INTRAVENOUS
  Filled 2024-05-21: qty 50

## 2024-05-21 NOTE — Progress Notes (Signed)
 Patient presents for D1C3 Ruxience  and follow up appointment with Dr. Rogers. Vital signs and lab work are within parameters for treatment. Potassium 3.1. Standing orders followed patient will receive 40 mEq PO potassium. Magnesium  1.6. Standing orders patient will receive 2 grams of magnesium  sulfate IV.   Message received from A.Lenon RN / Dr. Davonna to proceed with treatment.

## 2024-05-21 NOTE — Patient Instructions (Signed)
 [facility] [place] - A DEPT OF [facility]  Discharge Instructions: Thank you for choosing [facility] to provide your oncology and hematology care.  If you have a lab appointment with the Cancer Center - please note that after April 8th, 2024, all labs will be drawn in the cancer center.  You do not have to check in or register with the main entrance as you have in the past but will complete your check-in in the cancer center.  Wear comfortable clothing and clothing appropriate for easy access to any Portacath or PICC line.   We strive to give you quality time with your provider. You may need to reschedule your appointment if you arrive late (15 or more minutes).  Arriving late affects you and other patients whose appointments are after yours.  Also, if you miss three or more appointments without notifying the office, you may be dismissed from the clinic at the providers discretion.      For prescription refill requests, have your pharmacy contact our office and allow 72 hours for refills to be completed.    Today you received the following chemotherapy and/or immunotherapy agents Ruxience . Rituximab  Injection What is this medication? RITUXIMAB  (ri TUX i mab) treats leukemia and lymphoma. It works by blocking a protein that causes cancer cells to grow and multiply. This helps to slow or stop the spread of cancer cells. It may also be used to treat autoimmune conditions, such as arthritis. It works by slowing down an overactive immune system. It is a monoclonal antibody. This medicine may be used for other purposes; ask your health care provider or pharmacist if you have questions. COMMON BRAND NAME(S): RIABNI , Rituxan , RUXIENCE , truxima  What should I tell my care team before I take this medication? They need to know if you have any of these conditions: Chest pain Heart disease Immune system problems Infection, such as chickenpox, cold sores, hepatitis B,  herpes Irregular heartbeat or rhythm Kidney disease Low blood counts, such as low white cells, platelets, red cells Lung disease Recent or upcoming vaccine An unusual or allergic reaction to rituximab , other medications, foods, dyes, or preservatives Pregnant or trying to get pregnant Breast-feeding How should I use this medication? This medication is injected into a vein. It is given by a care team in a hospital or clinic setting. A special MedGuide will be given to you before each treatment. Be sure to read this information carefully each time. Talk to your care team about the use of this medication in children. While this medication may be prescribed for children as young as 6 months for selected conditions, precautions do apply. Overdosage: If you think you have taken too much of this medicine contact a poison control center or emergency room at once. NOTE: This medicine is only for you. Do not share this medicine with others. What if I miss a dose? Keep appointments for follow-up doses. It is important not to miss your dose. Call your care team if you are unable to keep an appointment. What may interact with this medication? Do not take this medication with any of the following: Live vaccines This medication may also interact with the following: Cisplatin This list may not describe all possible interactions. Give your health care provider a list of all the medicines, herbs, non-prescription drugs, or dietary supplements you use. Also tell them if you smoke, drink alcohol, or use illegal drugs. Some items may interact with your medicine. What  should I watch for while using this medication? Your condition will be monitored carefully while you are receiving this medication. You may need blood work while taking this medication. This medication can cause serious infusion reactions. To reduce the risk your care team may give you other medications to take before receiving this one. Be sure to  follow the directions from your care team. This medication may increase your risk of getting an infection. Call your care team for advice if you get a fever, chills, sore throat, or other symptoms of a cold or flu. Do not treat yourself. Try to avoid being around people who are sick. Call your care team if you are around anyone with measles, chickenpox, or if you develop sores or blisters that do not heal properly. Avoid taking medications that contain aspirin , acetaminophen , ibuprofen , naproxen, or ketoprofen unless instructed by your care team. These medications may hide a fever. This medication may cause serious skin reactions. They can happen weeks to months after starting the medication. Contact your care team right away if you notice fevers or flu-like symptoms with a rash. The rash may be red or purple and then turn into blisters or peeling of the skin. You may also notice a red rash with swelling of the face, lips, or lymph nodes in your neck or under your arms. In some patients, this medication may cause a serious brain infection that may cause death. If you have any problems seeing, thinking, speaking, walking, or standing, tell your care team right away. If you cannot reach your care team, urgently seek another source of medical care. Talk to your care team if you may be pregnant. Serious birth defects can occur if you take this medication during pregnancy and for 12 months after the last dose. You will need a negative pregnancy test before starting this medication. Contraception is recommended while taking this medication and for 12 months after the last dose. Your care team can help you find the option that works for you. Do not breastfeed while taking this medication and for at least 6 months after the last dose. What side effects may I notice from receiving this medication? Side effects that you should report to your care team as soon as possible: Allergic reactions or angioedema--skin rash,  itching or hives, swelling of the face, eyes, lips, tongue, arms, or legs, trouble swallowing or breathing Bowel blockage--stomach cramping, unable to have a bowel movement or pass gas, loss of appetite, vomiting Dizziness, loss of balance or coordination, confusion or trouble speaking Heart attack--pain or tightness in the chest, shoulders, arms, or jaw, nausea, shortness of breath, cold or clammy skin, feeling faint or lightheaded Heart rhythm changes--fast or irregular heartbeat, dizziness, feeling faint or lightheaded, chest pain, trouble breathing Infection--fever, chills, cough, sore throat, wounds that don't heal, pain or trouble when passing urine, general feeling of discomfort or being unwell Infusion reactions--chest pain, shortness of breath or trouble breathing, feeling faint or lightheaded Kidney injury--decrease in the amount of urine, swelling of the ankles, hands, or feet Liver injury--right upper belly pain, loss of appetite, nausea, light-colored stool, dark yellow or brown urine, yellowing skin or eyes, unusual weakness or fatigue Redness, blistering, peeling, or loosening of the skin, including inside the mouth Stomach pain that is severe, does not go away, or gets worse Tumor lysis syndrome (TLS)--nausea, vomiting, diarrhea, decrease in the amount of urine, dark urine, unusual weakness or fatigue, confusion, muscle pain or cramps, fast or irregular heartbeat, joint pain Side  effects that usually do not require medical attention (report to your care team if they continue or are bothersome): Headache Joint pain Nausea Runny or stuffy nose Unusual weakness or fatigue This list may not describe all possible side effects. Call your doctor for medical advice about side effects. You may report side effects to FDA at 1-800-FDA-1088. Where should I keep my medication? This medication is given in a hospital or clinic. It will not be stored at home. NOTE: This sheet is a summary. It  may not cover all possible information. If you have questions about this medicine, talk to your doctor, pharmacist, or health care provider.  2024 Elsevier/Gold Standard (2021-09-23 00:00:00)      To help prevent nausea and vomiting after your treatment, we encourage you to take your nausea medication as directed.  BELOW ARE SYMPTOMS THAT SHOULD BE REPORTED IMMEDIATELY: *FEVER GREATER THAN 100.4 F (38 C) OR HIGHER *CHILLS OR SWEATING *NAUSEA AND VOMITING THAT IS NOT CONTROLLED WITH YOUR NAUSEA MEDICATION *UNUSUAL SHORTNESS OF BREATH *UNUSUAL BRUISING OR BLEEDING *URINARY PROBLEMS (pain or burning when urinating, or frequent urination) *BOWEL PROBLEMS (unusual diarrhea, constipation, pain near the anus) TENDERNESS IN MOUTH AND THROAT WITH OR WITHOUT PRESENCE OF ULCERS (sore throat, sores in mouth, or a toothache) UNUSUAL RASH, SWELLING OR PAIN  UNUSUAL VAGINAL DISCHARGE OR ITCHING   Items with * indicate a potential emergency and should be followed up as soon as possible or go to the Emergency Department if any problems should occur.  Please show the CHEMOTHERAPY ALERT CARD or IMMUNOTHERAPY ALERT CARD at check-in to the Emergency Department and triage nurse.  Should you have questions after your visit or need to cancel or reschedule your appointment, please contact [facility] [place] - A DEPT OF [facility] [phone]  and follow the prompts.  Office hours are 8:00 a.m. to 4:30 p.m. Monday - Friday. Please note that voicemails left after 4:00 p.m. may not be returned until the following business day.  We are closed weekends and major holidays. You have access to a nurse at all times for urgent questions. Please call the main number to the clinic [phone] and follow the prompts.  For any non-urgent questions, you may also contact your provider using MyChart. We now offer e-Visits for anyone 49 and older to request care online for non-urgent symptoms. For details  visit [email or web address].   Also download the MyChart app! Go to the app store, search MyChart, open the app, select [place], and log in with your MyChart username and password.

## 2024-05-21 NOTE — Progress Notes (Signed)
Treatment given today per MD orders. Tolerated infusion without adverse affects. Vital signs stable. No complaints at this time. Discharged from clinic via wheel chair in stable condition. Alert and oriented x 3. F/U with [facility] as scheduled.  

## 2024-05-21 NOTE — Patient Instructions (Signed)
 [facility] at [facility] Discharge Instructions   You were seen and examined today by Dr. Davonna.  She reviewed the results of your lab work which are normal/stable.   We will send in some lasix  for you to use as needed for swelling in your leg.   We will proceed with your treatment today.   Return as scheduled.    Thank you for choosing [facility] at [facility] to provide your oncology and hematology care.  To afford each patient quality time with our provider, please arrive at least 15 minutes before your scheduled appointment time.   If you have a lab appointment with the Cancer Center please come in thru the Main Entrance and check in at the main information desk.  You need to re-schedule your appointment should you arrive 10 or more minutes late.  We strive to give you quality time with our providers, and arriving late affects you and other patients whose appointments are after yours.  Also, if you no show three or more times for appointments you may be dismissed from the clinic at the providers discretion.     Again, thank you for choosing [facility].  Our hope is that these requests will decrease the amount of time that you wait before being seen by our physicians.       _____________________________________________________________  Should you have questions after your visit to [facility], please contact our office at [phone] and follow the prompts.  Our office hours are 8:00 a.m. and 4:30 p.m. Monday - Friday.  Please note that voicemails left after 4:00 p.m. may not be returned until the following business day.  We are closed weekends and major holidays.  You do have access to a nurse 24-7, just call the main number to the clinic [phone] and do not press any options, hold on the line and a nurse will answer the phone.    For prescription refill requests, have your pharmacy contact our office and  allow 72 hours.    Due to Covid, you will need to wear a mask upon entering the hospital. If you do not have a mask, a mask will be given to you at the Main Entrance upon arrival. For doctor visits, patients may have 1 support person age 67 or older with them. For treatment visits, patients can not have anyone with them due to social distancing guidelines and our immunocompromised population.

## 2024-05-21 NOTE — Progress Notes (Signed)
 " [facility] at [facility]  HEMATOLOGY FOLLOW-UP VISIT  Cody Palmer, Cody Elizabeth, NP  REASON FOR FOLLOW-UP: Follicular lymphoma  ASSESSMENT & PLAN:  Patient is a 89 y.o. 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 Even though biopsy is consistent with follicular lymphoma, patient has been behaving like an aggressive lymphoma. Repeat biopsy from para-aortic lymph node showed atypical lymphoid infiltrate likely secondary to steroid and rituximab  therapy.  There was however a focus of grade 3B follicular lymphoma.  - Patient is significantly better after 2 cycles of rituximab .  LFTs improved to normal and ascites resolved at this time. - Labs reviewed today:CMP: Potassium: 3.1, creatinine: Normal, normal LFTs, normal bilirubin, alkaline phosphatase:210, last LDH: 687.  CBC: WBC: 6.7, hemoglobin: 9.1, platelets: 187 - Physical exam stable today.  Will proceed with rituximab  today. -As patient significantly improved with rituximab , considering his age and comorbidities, and there is no evidence of DLBCL at this time, will consider rituximab  monotherapy.  Can consider weekly rituximab  for 4-6 cycles followed by every 2 months for 2 years. - Will obtain PET scan after fourth cycle.  If patient does not have good response on this, can consider adding Bendamustine.  Return to clinic prior to fifth cycle with PET scan.  Pedal edema Patient has bilateral pitting pedal edema  - Start Lasix  40 mg daily as needed -Will consider obtaining echo if patient does not improve with rituximab  treatment.   Ascites Likely secondary to lymphoma Significantly improved at this time  Hypokalemia  Will replace per protocol   Elevated liver enzymes Resolved  Orders Placed This Encounter  Procedures   NM PET Image Restag (PS) Skull Base To Thigh     Standing Status:   Future    Expected Date:   05/28/2024    Expiration Date:   05/21/2025    If indicated for the ordered procedure, I authorize the administration of a radiopharmaceutical per Radiology protocol:   Yes    Preferred imaging location?:   [name]    The total time spent in the appointment was 20 minutes encounter with patients including review of chart and various tests results, discussions about plan of care and coordination of care plan   All questions were answered. The patient knows to call the clinic with any problems, questions or concerns. No barriers to learning was detected.   LILLETTE Verneta SAUNDERS Teague,acting as a neurosurgeon for Cody Dry, MD.,have documented all relevant documentation on the behalf of Cody Dry, MD,as directed by  Cody Dry, MD while in the presence of Cody Dry, MD.  I, Cody Dry MD, have reviewed the above documentation for accuracy and completeness, and I agree with the above.    Cody Dry, MD 1/6/202610:03 PM   SUMMARY OF HEMATOLOGIC HISTORY: Diagnosis: Follicular lymphoma- Likely stage IV   -Presentation: Abdominal distention with poor oral tolerance and nausea -03/27/2024: CBC diff: WBC:12.9 with elevated absolute lymphocytes at 3.2, HGB: 12.7, PLT: 126. -03/27/2024: Immature Cell Panel: Metamyelocytes : 3, Myelocytes 2, Uncertain lineage: 13.  -04/04/2024: Peripheral Blood Flow Cytometry: Clonal B-cell population comprising 37% of the lymphocytes.  The cells are positive for CD19, CD20, CD22, CD11c and express lambda light chains.  -04/04/2024: LDH 1,128 -04/04/2024: CT AP: Interval increased ill-defined soft tissue density throughout the base of the mesentery with diffuse stranding of the mesenteric fat and new ascites. Findings are nonspecific, and potentially  still inflammatory, but suspicious for lymphoma given the associated enlarged pericardiac, retroperitoneal and inguinal lymph nodes. Persistent wall  thickening of the ascending and transverse colon with increased wall thickening of the terminal ileum. These findings could be secondary to infection, inflammatory bowel disease or lymphedema. No evidence of bowel obstruction or perforation. -04/05/2024: CT chest: No evidence of thoracic metastasis.  -04/15/2024: CT AP:  Grossly similar ill-defined soft tissue throughout the base of the mesentery. Increased small to moderate abdominopelvic ascites since 03/27/24. Wall thickening about the terminal ileum and ascending and transverse colon, grossly similar to prior given differences in contrast administration. Unchanged left periaortic, pericardiac, and left inguinal lymphadenopathy. -04/17/2024: Paracentesis yielding 2.2 liters of fluid.  Cytology: Lymphoma  Flow cytometry: Tight clonal cluster of CD10, CD19, CD20, and CD38 positive B cells comprising 54% of gated population. The tight clonal cluster of B cells shows dim light chain positivity of a clinically occult light chain restriction.  This is due to the presence of the clonal cluster very low/dim kappa/lambda borderline expression making definitive light chain restriction difficult to interpret.  -04/17/2024: Left groin lymph node biopsy.  Pathology: Follicular lymphoma. The cells of interest are CD20 positive B cells which coexpress CD10, BCL6 and aberrantly strongly Bcl-2. There is a relative loss of the follicular dendritic cell meshwork and the predominant CD23 positive cells are the cells of interest. The proliferation rate by Ki-67 is quite low (<5%). CD3, CD5 and CD43 highlight background small T cells. Cyclin D1 is negative in the cells of interest. CK AE1/AE3 is negative.  Flow Cytometry: Tight clonal cluster of CD10, CD19, CD20, and CD38 positive B cells comprising 88% of gated population. -04/17/2024: Uric Acid- 20.4. LDH- 1,263. Phosphorous- 6.4 -04/18/2024: Hepatitis B S AB quant <3.5 -04/21/2024: Uric acid 7.4 -04/24/2024: Albumin -  3.0. AST- 47. ALT- 40. Alkaline Phosphatase- 152. Phosphorous- 2.9. -05/02/2024: CMP: AST- 453, ALT- 297, Alkaline Phosphatase- 1,402, Total bilirubin- 3.3.  -05/02/2024: Initial PET: Extensive tracer-avid tumor is identified within the neck, chest, abdomen, and pelvis. Imaging findings are compatible with biopsy proven lymphoma. Tumor within the abdomen and pelvis involves the peritoneal, mesenteric, and retroperitoneal spaces with bulky mesenteric adenopathy. Tumor within the porta hepatic region invades the falciform ligament with invasion of the umbilical vein. 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 -05/03/2024: US  of RUQ:  Ill-defined mixed echogenic mass within the porta hepatis measuring approximately 9.7 x 9.7 x 5.7 cm, corresponding with the metabolically avid mass noted on the previous PET CT. -05/03/2024- Current: Weekly Rituximab  -05/07/2024: Left paraaortic lymph node biopsy.  Pathology: Atypical lymphoid infiltrate. Given the clinical history of a follicular lymphoma, however, is highly suspicious for a possible focal area of grade 3B follicular lymphoma; however, the definitive pathognomonic diagnosis cannot be made on this highly limited sample.  Flow cytometry: Limited material with a small population of dim CD19, CD20 negative cells possibly related to Rituxan  therapy, refer to concurrent surgical report for further discussion.   INTERVAL HISTORY: LAZER WOLLARD 89 y.o. male following for follicular lymphoma. Patient was accompanied by his son and daughter.  Zaivion reports feeling well overall and significantly improved since starting treatment. He notes lower extremity swelling, worsened in the right foot. We will prescribe lasix  40 mg. Euriah also reports diarrhea almost daily, and has low potassium and magnesium  on his labs today.   He currently resides in rehab and his family is unaware when he will be discharged, though his strength has  improved.   I have  reviewed the past medical history, past surgical history, social history and family history with the patient   ALLERGIES:  is allergic to rituximab .  MEDICATIONS:  Current Outpatient Medications  Medication Sig Dispense Refill   furosemide  (LASIX ) 40 MG tablet Take 1 tablet (40 mg total) by mouth daily as needed (Take in am for swelling). 30 tablet 3   allopurinol  (ZYLOPRIM ) 300 MG tablet Take 0.5 tablets (150 mg total) by mouth daily.     aspirin  EC 81 MG tablet Take 81 mg by mouth daily. Swallow whole.     Cholecalciferol  (VITAMIN D3) 25 MCG (1000 UT) CAPS Take 1,000 Units by mouth daily.     fluticasone  (FLONASE ) 50 MCG/ACT nasal spray Place 2 sprays into both nostrils daily.     loratadine  (CLARITIN ) 10 MG tablet Take 1 tablet (10 mg total) by mouth daily.     Multiple Vitamins-Minerals (EYE HEALTH AREDS 2 PO) Take 1 capsule by mouth 2 (two) times daily.     Nutritional Supplements (GLUCERNA 1.0 CAL/FIBER) LIQD Take 1 Bottle by mouth 2 (two) times daily between meals.     Nutritional Supplements (PROSOURCE) LIQD Take 30 mLs by mouth 3 (three) times daily.     omeprazole (PRILOSEC) 40 MG capsule Take 40 mg by mouth daily.     oxyCODONE  (OXY IR/ROXICODONE ) 5 MG immediate release tablet Take 1 tablet (5 mg total) by mouth every 6 (six) hours as needed for severe pain (pain score 7-10). 30 tablet 0   polyethylene glycol (MIRALAX ) 17 g packet Take 17 g by mouth daily as needed for mild constipation or moderate constipation. 14 each 0   prochlorperazine  (COMPAZINE ) 10 MG tablet Take 1 tablet (10 mg total) by mouth every 6 (six) hours as needed for nausea or vomiting. 30 tablet 0   rosuvastatin  (CRESTOR ) 10 MG tablet Take 10 mg by mouth every other day.     simethicone  (MYLICON) 80 MG chewable tablet Chew 1 tablet (80 mg total) by mouth every 6 (six) hours as needed for flatulence. 30 tablet 0   SITagliptin  25 MG TABS Take 12.5 mg by mouth daily at 6 (six) AM.     No current  facility-administered medications for this visit.    PHYSICAL EXAMINATION:   Vitals:   05/21/24 1046 05/21/24 1049  BP: (!) 146/68 (!) 145/70  Pulse: 95   Resp: 18   Temp: 99.1 F (37.3 C)   SpO2: 96%     GENERAL:alert, no distress and comfortable SKIN: skin color, texture, turgor are normal, no rashes or significant lesions LYMPH:  no palpable lymphadenopathy in the cervical, axillary or inguinal LUNGS: clear to auscultation and percussion with normal breathing effort HEART: regular rate & rhythm and no murmurs and B/L pitting pedal edema extending upto thighs ABDOMEN:Distended, soft, normal bowel sounds Musculoskeletal:no cyanosis of digits and no clubbing  NEURO: alert & oriented x 3 with fluent speech  LABORATORY DATA:  I have reviewed the data as listed  Lab Results  Component Value Date   WBC 6.7 05/21/2024   NEUTROABS 4.6 05/21/2024   HGB 9.1 (L) 05/21/2024   HCT 29.4 (L) 05/21/2024   MCV 88.0 05/21/2024   PLT 187 05/21/2024        Chemistry      Component Value Date/Time   NA 142 05/21/2024 1000   NA 139 03/27/2024 1513   K 3.1 (L) 05/21/2024 1000   CL 105 05/21/2024 1000   CO2 30 05/21/2024 1000   BUN 12 05/21/2024  1000   BUN 32 (H) 03/27/2024 1513   CREATININE 0.78 05/21/2024 1000      Component Value Date/Time   CALCIUM  7.6 (L) 05/21/2024 1000   ALKPHOS 210 (H) 05/21/2024 1000   AST 26 05/21/2024 1000   ALT 18 05/21/2024 1000   BILITOT 0.6 05/21/2024 1000   BILITOT 0.7 03/27/2024 1513       Latest Reference Range & Units 05/07/24 19:08  LDH 105 - 235 U/L 687 (H)  (H): Data is abnormally high  RADIOGRAPHIC STUDIES: I have personally reviewed the radiological images as listed and agreed with the findings in the report.  "

## 2024-05-23 ENCOUNTER — Other Ambulatory Visit: Payer: Self-pay | Admitting: Adult Health

## 2024-05-23 ENCOUNTER — Telehealth: Payer: Self-pay

## 2024-05-23 ENCOUNTER — Non-Acute Institutional Stay (SKILLED_NURSING_FACILITY): Payer: Self-pay | Admitting: Adult Health

## 2024-05-23 ENCOUNTER — Encounter: Payer: Self-pay | Admitting: Adult Health

## 2024-05-23 DIAGNOSIS — Z7984 Long term (current) use of oral hypoglycemic drugs: Secondary | ICD-10-CM

## 2024-05-23 DIAGNOSIS — I129 Hypertensive chronic kidney disease with stage 1 through stage 4 chronic kidney disease, or unspecified chronic kidney disease: Secondary | ICD-10-CM

## 2024-05-23 DIAGNOSIS — C8299 Follicular lymphoma, unspecified, extranodal and solid organ sites: Secondary | ICD-10-CM

## 2024-05-23 DIAGNOSIS — N1831 Chronic kidney disease, stage 3a: Secondary | ICD-10-CM

## 2024-05-23 DIAGNOSIS — E1122 Type 2 diabetes mellitus with diabetic chronic kidney disease: Secondary | ICD-10-CM | POA: Diagnosis not present

## 2024-05-23 DIAGNOSIS — N1832 Chronic kidney disease, stage 3b: Secondary | ICD-10-CM

## 2024-05-23 MED ORDER — FUROSEMIDE 40 MG PO TABS: 40.0000 mg | ORAL_TABLET | Freq: Every day | ORAL | 0 refills | Status: DC | PRN

## 2024-05-23 MED ORDER — PROCHLORPERAZINE MALEATE 10 MG PO TABS: 10.0000 mg | ORAL_TABLET | Freq: Four times a day (QID) | ORAL | 0 refills | Status: AC | PRN

## 2024-05-23 MED ORDER — OMEPRAZOLE 40 MG PO CPDR: 40.0000 mg | DELAYED_RELEASE_CAPSULE | Freq: Every day | ORAL | 0 refills | Status: DC

## 2024-05-23 MED ORDER — ALLOPURINOL 300 MG PO TABS: 150.0000 mg | ORAL_TABLET | Freq: Every day | ORAL | 0 refills | Status: AC

## 2024-05-23 MED ORDER — ROSUVASTATIN CALCIUM 10 MG PO TABS: 10.0000 mg | ORAL_TABLET | ORAL | 0 refills | Status: AC

## 2024-05-23 MED ORDER — SITAGLIPTIN 25 MG PO TABS: 12.5000 mg | ORAL_TABLET | Freq: Every day | ORAL | 0 refills | Status: DC

## 2024-05-23 NOTE — Progress Notes (Unsigned)
 " Location:  [facility] Room Number: 159 Place of Service:  SNF (31)   CODE STATUS: dnr   Allergies[1]  Chief Complaint  Patient presents with   Discharge Note    To home     HPI:  He is being discharged to home with home health for pt/ot. He will need his prescriptions written and will need to follow up with his medical provider. He will not need any dme. He had been discharged to home 4 days prior to his hospitalization. He became increasingly weaker. He was taken to the ED for poor intake; lack of appetite; poor fluid intact and failure to thrive in adult. He had a colonoscopy on 04-05-24; with normal findings except for polyps.  He was also seen by oncology for an outpatient PET scan and lymph node biopsy. He was treated for ckd stage 3a with hyperkalemia. He was admitted to this facility for short term rehab: therapy:  ambulate 200 feet with rolling walker and moderate I; upper body independent; lower body with mod assist; steps bilateral rail and supervision; brp: mod I.   Past Medical History:  Diagnosis Date   Arthritis    Cancer (HCC)    Skin cancer   Diabetes mellitus without complication (HCC)    HTN (hypertension)    Hypercholesteremia     Past Surgical History:  Procedure Laterality Date   CATARACT EXTRACTION W/PHACO Left 04/25/2016   Procedure: CATARACT EXTRACTION PHACO AND INTRAOCULAR LENS PLACEMENT LEFT EYE;  Surgeon: Cherene Mania, MD;  Location: [facility];  Service: Ophthalmology;  Laterality: Left;  CDE: 10.37   COLONOSCOPY N/A 04/05/2024   Procedure: COLONOSCOPY;  Surgeon: Cindie Carlin POUR, DO;  Location: [facility];  Service: Endoscopy;  Laterality: N/A;   CYSTOSCOPY WITH BIOPSY N/A 08/13/2013   Procedure: CYSTOSCOPY WITH BLADDER BIOPSY;  Surgeon: Garnette Shack, MD;  Location: [facility];  Service: Urology;  Laterality: N/A;   ENUCLEATION Right    Removed in 1958   EYE SURGERY  1958   removal of eye   TOTAL KNEE ARTHROPLASTY Right  01/11/2013   Procedure: RIGHT TOTAL KNEE ARTHROPLASTY;  Surgeon: Taft FORBES Minerva, MD;  Location: [facility];  Service: Orthopedics;  Laterality: Right;   TOTAL KNEE ARTHROPLASTY Left 07/12/2022   Procedure: TOTAL KNEE ARTHROPLASTY;  Surgeon: Minerva Taft FORBES, MD;  Location: [facility];  Service: Orthopedics;  Laterality: Left;    Social History   Socioeconomic History   Marital status: Widowed    Spouse name: Not on file   Number of children: Not on file   Years of education: Not on file   Highest education level: Not on file  Occupational History   Not on file  Tobacco Use   Smoking status: Former    Current packs/day: 0.00    Average packs/day: 1 pack/day for 8.0 years (8.0 ttl pk-yrs)    Types: Cigarettes    Start date: 01/07/1957    Quit date: 01/07/1965    Years since quitting: 59.4   Smokeless tobacco: Never  Vaping Use   Vaping status: Never Used  Substance and Sexual Activity   Alcohol use: No   Drug use: No   Sexual activity: Yes    Birth control/protection: None  Other Topics Concern   Not on file  Social History Narrative   Not on file   Social Drivers of Health   Tobacco Use: Medium Risk (05/23/2024)   Patient History    Smoking Tobacco Use: Former  Smokeless Tobacco Use: Never    Passive Exposure: Not on file  Financial Resource Strain: Not on file  Food Insecurity: No Food Insecurity (05/02/2024)   Epic    Worried About Programme Researcher, Broadcasting/film/video in the Last Year: Never true    Ran Out of Food in the Last Year: Never true  Transportation Needs: Patient Declined (05/02/2024)   Epic    Lack of Transportation (Medical): Patient declined    Lack of Transportation (Non-Medical): Patient declined  Physical Activity: Not on file  Stress: Not on file  Social Connections: Patient Declined (05/02/2024)   Social Connection and Isolation Panel    Frequency of Communication with Friends and Family: Patient declined    Frequency of Social Gatherings with Friends and  Family: Patient declined    Attends Religious Services: Patient declined    Database Administrator or Organizations: Patient declined    Attends Banker Meetings: Patient declined    Marital Status: Patient declined  Recent Concern: Social Connections - Moderately Isolated (04/10/2024)   Social Connection and Isolation Panel    Frequency of Communication with Friends and Family: More than three times a week    Frequency of Social Gatherings with Friends and Family: More than three times a week    Attends Religious Services: 1 to 4 times per year    Active Member of [employer] or Organizations: No    Attends Banker Meetings: Never    Marital Status: Widowed  Intimate Partner Violence: Patient Declined (05/02/2024)   Epic    Fear of Current or Ex-Partner: Patient declined    Emotionally Abused: Patient declined    Physically Abused: Patient declined    Sexually Abused: Patient declined  Depression (PHQ2-9): Low Risk (05/21/2024)   Depression (PHQ2-9)    PHQ-2 Score: 0  Alcohol Screen: Not on file  Housing: Patient Declined (05/02/2024)   Epic    Unable to Pay for Housing in the Last Year: Patient declined    Number of Times Moved in the Last Year: Not on file    Homeless in the Last Year: Patient declined  Utilities: Patient Declined (05/02/2024)   Epic    Threatened with loss of utilities: Patient declined  Health Literacy: Not on file   Family History  Problem Relation Age of Onset   Arthritis Unknown    Cancer Unknown    Diabetes Unknown       VITAL SIGNS BP 132/66   Pulse 86   Temp (!) 97.5 F (36.4 C)   Resp 18   Ht 5' 9 (1.753 m)   Wt 189 lb 3.2 oz (85.8 kg)   SpO2 95%   BMI 27.94 kg/m   Outpatient Encounter Medications as of 05/23/2024  Medication Sig   allopurinol  (ZYLOPRIM ) 300 MG tablet Take 0.5 tablets (150 mg total) by mouth daily.   aspirin  EC 81 MG tablet Take 81 mg by mouth daily. Swallow whole.   Cholecalciferol  (VITAMIN D3)  25 MCG (1000 UT) CAPS Take 1,000 Units by mouth daily.   fluticasone  (FLONASE ) 50 MCG/ACT nasal spray Place 2 sprays into both nostrils daily.   furosemide  (LASIX ) 40 MG tablet Take 1 tablet (40 mg total) by mouth daily as needed (Take in am for swelling).   loratadine  (CLARITIN ) 10 MG tablet Take 1 tablet (10 mg total) by mouth daily.   Multiple Vitamins-Minerals (EYE HEALTH AREDS 2 PO) Take 1 capsule by mouth 2 (two) times daily.   Nutritional Supplements (GLUCERNA  1.0 CAL/FIBER) LIQD Take 1 Bottle by mouth 2 (two) times daily between meals.   Nutritional Supplements (PROSOURCE) LIQD Take 30 mLs by mouth 3 (three) times daily.   omeprazole  (PRILOSEC) 40 MG capsule Take 1 capsule (40 mg total) by mouth daily.   polyethylene glycol (MIRALAX ) 17 g packet Take 17 g by mouth daily as needed for mild constipation or moderate constipation.   prochlorperazine  (COMPAZINE ) 10 MG tablet Take 1 tablet (10 mg total) by mouth every 6 (six) hours as needed for nausea or vomiting.   rosuvastatin  (CRESTOR ) 10 MG tablet Take 1 tablet (10 mg total) by mouth every other day.   simethicone  (MYLICON) 80 MG chewable tablet Chew 1 tablet (80 mg total) by mouth every 6 (six) hours as needed for flatulence.   SITagliptin  25 MG TABS Take 12.5 mg by mouth daily at 6 (six) AM.   No facility-administered encounter medications on file as of 05/23/2024.     SIGNIFICANT DIAGNOSTIC EXAMS  LABS:   04-10-24: wbc 5.0; hgb 9.3; hct 83.8 plt 153; glucose 102; bun 60; creat 2.45; k+ 5.0; na++ 134; ca 8. 3; gfr 25; protein 5.6; albumin  3.6; ast 70 alt 50 04-13-24: glucose 81; bun 43; creat 1.53; k+ 4.5; na++ 139; ca 8.3 gfr 43 04-16-24: glucose 91; bun 59; creat 2.35; k+ 5.2; na++ 137; ca 8.2 gfr 26; phos 5.3 albumin  3.2 LDH 1249.0 04-17-24: uric acid 20.4 04-21-24: uric acid 7.4 04-24-24: wbc 7.7; hgb 9.2; hct 29.9; mcv 90.3 plt 213; glucose 93; bun 51; creat 1.27; k+ 3.3; na++ 144; ca 7.8 gfr 54; alk phos 152; LDH 996 05-14-24:  wbc 6.4; hgb 9.2; hct 29.4 plt 220; glucose 102; bun 18; creat 1.16; k+ 4.7; na++ 142; ca 7.9 gfr >60; protein 4.8 albumin  3.0; alk phos 330; mag 2.0   Review of Systems  Constitutional:  Negative for malaise/fatigue.  Respiratory:  Negative for cough and shortness of breath.   Cardiovascular:  Negative for chest pain, palpitations and leg swelling.  Gastrointestinal:  Negative for abdominal pain, constipation and heartburn.  Musculoskeletal:  Negative for back pain, joint pain and myalgias.  Skin: Negative.   Neurological:  Negative for dizziness.  Psychiatric/Behavioral:  The patient is not nervous/anxious.    Physical Exam Constitutional:      General: He is not in acute distress.    Appearance: He is well-developed. He is not diaphoretic.  Neck:     Thyroid: No thyromegaly.  Cardiovascular:     Rate and Rhythm: Normal rate and regular rhythm.     Pulses: Normal pulses.     Heart sounds: Murmur heard.  Pulmonary:     Effort: Pulmonary effort is normal. No respiratory distress.     Breath sounds: Normal breath sounds.  Abdominal:     General: Bowel sounds are normal. There is distension.     Palpations: Abdomen is soft.     Tenderness: There is no abdominal tenderness.  Musculoskeletal:        General: Normal range of motion.     Cervical back: Neck supple.     Right lower leg: Edema present.     Left lower leg: Edema present.  Lymphadenopathy:     Cervical: No cervical adenopathy.  Skin:    General: Skin is warm and dry.  Neurological:     Mental Status: He is alert. Mental status is at baseline.  Psychiatric:        Mood and Affect: Mood normal.  ASSESSMENT/ PLAN:  Patient is being discharged with the following home health services: pt/ot to evaluate and treat as indicated for gait balance strength adl training.    Patient is being discharged with the following durable medical equipment:  none needed   Patient has been advised to f/u with their PCP in 1-2  weeks to for a transitions of care visit.  Social services at their facility was responsible for arranging this appointment.  Pt was provided with adequate prescriptions of noncontrolled medications to reach the scheduled appointment .  For controlled substances, a limited supply was provided as appropriate for the individual patient.  If the pt normally receives these medications from a pain clinic or has a contract with another physician, these medications should be received from that clinic or physician only).    Medications have been sent to [facility]   Time spent with patient: 35 minutes: medications; home health; dme.    ASSESSMENT/ PLAN:     Barnie Seip NP [facility]  Contact [phone] Monday through Friday 8am- 5pm  After hours call [phone]      [1]  Allergies Allergen Reactions   Rituximab  Shortness Of Breath and Cough    Patient had hypersensitivity reaction to rituximab  which included SHOB, cough, throat tightness. Emergency Pepcid , Benadryl , Solu-Medrol  given.    "

## 2024-05-23 NOTE — Telephone Encounter (Signed)
 Copied from CRM [id number]. Topic: Clinical - Prescription Issue >> May 23, 2024  2:10 PM Susanna ORN wrote: Reason for CRM: Tammy, with [facility], called in stating that Barnie Seip sent in a prescription for SITagliptin  25 MG TABS for a half a tablet. Tammy stated that she doesn't think this pill is suppose to be broken in half. Please have Barnie or nurse to give her a call back. CB #: [phone]. Please advise

## 2024-05-23 NOTE — Telephone Encounter (Signed)
 Cody Palmer has been notified and voiced understanding

## 2024-05-28 ENCOUNTER — Inpatient Hospital Stay

## 2024-05-28 ENCOUNTER — Inpatient Hospital Stay: Admitting: Oncology

## 2024-05-28 ENCOUNTER — Other Ambulatory Visit: Payer: Self-pay | Admitting: Oncology

## 2024-05-28 VITALS — BP 135/70 | HR 93 | Temp 97.2°F | Resp 19 | Wt 178.8 lb

## 2024-05-28 DIAGNOSIS — C8299 Follicular lymphoma, unspecified, extranodal and solid organ sites: Secondary | ICD-10-CM

## 2024-05-28 DIAGNOSIS — Z5112 Encounter for antineoplastic immunotherapy: Secondary | ICD-10-CM | POA: Diagnosis not present

## 2024-05-28 LAB — COMPREHENSIVE METABOLIC PANEL WITH GFR
ALT: 14 U/L (ref 0–44)
AST: 23 U/L (ref 15–41)
Albumin: 3.4 g/dL — ABNORMAL LOW (ref 3.5–5.0)
Alkaline Phosphatase: 178 U/L — ABNORMAL HIGH (ref 38–126)
Anion gap: 14 (ref 5–15)
BUN: 16 mg/dL (ref 8–23)
CO2: 27 mmol/L (ref 22–32)
Calcium: 8.5 mg/dL — ABNORMAL LOW (ref 8.9–10.3)
Chloride: 103 mmol/L (ref 98–111)
Creatinine, Ser: 1.09 mg/dL (ref 0.61–1.24)
GFR, Estimated: >60 mL/min
Glucose, Bld: 137 mg/dL — ABNORMAL HIGH (ref 70–99)
Potassium: 3.3 mmol/L — ABNORMAL LOW (ref 3.5–5.1)
Sodium: 143 mmol/L (ref 135–145)
Total Bilirubin: 0.7 mg/dL (ref 0.0–1.2)
Total Protein: 5.5 g/dL — ABNORMAL LOW (ref 6.5–8.1)

## 2024-05-28 LAB — CBC WITH DIFFERENTIAL/PLATELET
Abs Immature Granulocytes: 0.08 K/uL — ABNORMAL HIGH (ref 0.00–0.07)
Basophils Absolute: 0 K/uL (ref 0.0–0.1)
Basophils Relative: 0 %
Eosinophils Absolute: 0.3 K/uL (ref 0.0–0.5)
Eosinophils Relative: 3 %
HCT: 33 % — ABNORMAL LOW (ref 39.0–52.0)
Hemoglobin: 10.1 g/dL — ABNORMAL LOW (ref 13.0–17.0)
Immature Granulocytes: 1 %
Lymphocytes Relative: 22 %
Lymphs Abs: 2.2 K/uL (ref 0.7–4.0)
MCH: 27.2 pg (ref 26.0–34.0)
MCHC: 30.6 g/dL (ref 30.0–36.0)
MCV: 88.7 fL (ref 80.0–100.0)
Monocytes Absolute: 0.9 K/uL (ref 0.1–1.0)
Monocytes Relative: 9 %
Neutro Abs: 6.5 K/uL (ref 1.7–7.7)
Neutrophils Relative %: 65 %
Platelets: 239 K/uL (ref 150–400)
RBC: 3.72 MIL/uL — ABNORMAL LOW (ref 4.22–5.81)
RDW: 18.6 % — ABNORMAL HIGH (ref 11.5–15.5)
WBC: 9.9 K/uL (ref 4.0–10.5)
nRBC: 0 % (ref 0.0–0.2)

## 2024-05-28 LAB — MAGNESIUM: Magnesium: 1.7 mg/dL (ref 1.7–2.4)

## 2024-05-28 MED ORDER — FAMOTIDINE IN NACL 20-0.9 MG/50ML-% IV SOLN
20.0000 mg | Freq: Once | INTRAVENOUS | Status: AC
  Administered 2024-05-28: 20 mg via INTRAVENOUS
  Filled 2024-05-28: qty 50

## 2024-05-28 MED ORDER — ACETAMINOPHEN 325 MG PO TABS
650.0000 mg | ORAL_TABLET | Freq: Once | ORAL | Status: AC
  Administered 2024-05-28: 650 mg via ORAL
  Filled 2024-05-28: qty 2

## 2024-05-28 MED ORDER — METHYLPREDNISOLONE SODIUM SUCC 125 MG IJ SOLR
125.0000 mg | Freq: Once | INTRAMUSCULAR | Status: AC
  Administered 2024-05-28: 125 mg via INTRAVENOUS
  Filled 2024-05-28: qty 2

## 2024-05-28 MED ORDER — SODIUM CHLORIDE 0.9 % IV SOLN: INTRAVENOUS | Status: DC

## 2024-05-28 MED ORDER — DIPHENHYDRAMINE HCL 50 MG/ML IJ SOLN
25.0000 mg | Freq: Once | INTRAMUSCULAR | Status: AC
  Administered 2024-05-28: 25 mg via INTRAVENOUS
  Filled 2024-05-28: qty 1

## 2024-05-28 MED ORDER — SODIUM CHLORIDE 0.9 % IV SOLN
375.0000 mg/m2 | Freq: Once | INTRAVENOUS | Status: AC
  Administered 2024-05-28: 800 mg via INTRAVENOUS
  Filled 2024-05-28: qty 50

## 2024-05-28 MED ORDER — POTASSIUM CHLORIDE CRYS ER 20 MEQ PO TBCR
40.0000 meq | EXTENDED_RELEASE_TABLET | Freq: Once | ORAL | Status: AC
  Administered 2024-05-28: 40 meq via ORAL
  Filled 2024-05-28: qty 2

## 2024-05-28 NOTE — Progress Notes (Signed)
 Patient presents today for Rituxan  per providers order.  Vital signs and labs within parameters for treatment.  Patient has no new complaints at this time.  Peripheral IV started and blood return noted pre and post infusion.  Treatment given today per MD orders.  Stable during infusion without adverse affects.  Vital signs stable.  No complaints at this time.  Discharge from clinic ambulatory in stable condition.  Alert and oriented X 3.  Follow [facility] with [facility] as scheduled.

## 2024-05-28 NOTE — Patient Instructions (Signed)
 [facility] [place] - A DEPT OF [facility]  Discharge Instructions: Thank you for choosing [facility] to provide your oncology and hematology care.  If you have a lab appointment with the Cancer Center - please note that after April 8th, 2024, all labs will be drawn in the cancer center.  You do not have to check in or register with the main entrance as you have in the past but will complete your check-in in the cancer center.  Wear comfortable clothing and clothing appropriate for easy access to any Portacath or PICC line.   We strive to give you quality time with your provider. You may need to reschedule your appointment if you arrive late (15 or more minutes).  Arriving late affects you and other patients whose appointments are after yours.  Also, if you miss three or more appointments without notifying the office, you may be dismissed from the clinic at the provider's discretion.      For prescription refill requests, have your pharmacy contact our office and allow 72 hours for refills to be completed.    Today you received the following chemotherapy and/or immunotherapy agents rituxan.        To help prevent nausea and vomiting after your treatment, we encourage you to take your nausea medication as directed.  BELOW ARE SYMPTOMS THAT SHOULD BE REPORTED IMMEDIATELY: *FEVER GREATER THAN 100.4 F (38 C) OR HIGHER *CHILLS OR SWEATING *NAUSEA AND VOMITING THAT IS NOT CONTROLLED WITH YOUR NAUSEA MEDICATION *UNUSUAL SHORTNESS OF BREATH *UNUSUAL BRUISING OR BLEEDING *URINARY PROBLEMS (pain or burning when urinating, or frequent urination) *BOWEL PROBLEMS (unusual diarrhea, constipation, pain near the anus) TENDERNESS IN MOUTH AND THROAT WITH OR WITHOUT PRESENCE OF ULCERS (sore throat, sores in mouth, or a toothache) UNUSUAL RASH, SWELLING OR PAIN  UNUSUAL VAGINAL DISCHARGE OR ITCHING   Items with * indicate a potential emergency and should be followed  up as soon as possible or go to the Emergency Department if any problems should occur.  Please show the CHEMOTHERAPY ALERT CARD or IMMUNOTHERAPY ALERT CARD at check-in to the Emergency Department and triage nurse.  Should you have questions after your visit or need to cancel or reschedule your appointment, please contact [facility] [place] - A DEPT OF [facility] [phone]  and follow the prompts.  Office hours are 8:00 a.m. to 4:30 p.m. Monday - Friday. Please note that voicemails left after 4:00 p.m. may not be returned until the following business day.  We are closed weekends and major holidays. You have access to a nurse at all times for urgent questions. Please call the main number to the clinic [phone] and follow the prompts.  For any non-urgent questions, you may also contact your provider using MyChart. We now offer e-Visits for anyone 73 and older to request care online for non-urgent symptoms. For details visit [email or web address].   Also download the MyChart app! Go to the app store, search "MyChart", open the app, select [place], and log in with your MyChart username and password.

## 2024-05-30 ENCOUNTER — Ambulatory Visit (HOSPITAL_COMMUNITY)
Admission: RE | Admit: 2024-05-30 | Discharge: 2024-05-30 | Disposition: A | Discharge status: Home / Self Care | Source: Ambulatory Visit | Attending: Oncology | Admitting: Oncology

## 2024-05-30 DIAGNOSIS — C8299 Follicular lymphoma, unspecified, extranodal and solid organ sites: Secondary | ICD-10-CM | POA: Insufficient documentation

## 2024-05-30 MED ORDER — FLUDEOXYGLUCOSE F - 18 (FDG) INJECTION
9.3000 | Freq: Once | INTRAVENOUS | Status: AC | PRN
  Administered 2024-05-30: 9.3 via INTRAVENOUS

## 2024-06-02 ENCOUNTER — Other Ambulatory Visit: Payer: Self-pay

## 2024-06-02 ENCOUNTER — Emergency Department (HOSPITAL_COMMUNITY)

## 2024-06-02 ENCOUNTER — Inpatient Hospital Stay (HOSPITAL_COMMUNITY)
Admission: EM | Admit: 2024-06-02 | Discharge: 2024-06-07 | DRG: 871 | Disposition: A | Discharge status: Home Health | Attending: Family Medicine | Admitting: Family Medicine

## 2024-06-02 ENCOUNTER — Encounter (HOSPITAL_COMMUNITY): Payer: Self-pay | Admitting: Emergency Medicine

## 2024-06-02 DIAGNOSIS — A419 Sepsis, unspecified organism: Principal | ICD-10-CM | POA: Diagnosis present

## 2024-06-02 DIAGNOSIS — J9601 Acute respiratory failure with hypoxia: Secondary | ICD-10-CM | POA: Diagnosis not present

## 2024-06-02 DIAGNOSIS — J90 Pleural effusion, not elsewhere classified: Secondary | ICD-10-CM | POA: Diagnosis present

## 2024-06-02 DIAGNOSIS — N1832 Chronic kidney disease, stage 3b: Secondary | ICD-10-CM | POA: Diagnosis present

## 2024-06-02 DIAGNOSIS — J189 Pneumonia, unspecified organism: Secondary | ICD-10-CM | POA: Diagnosis present

## 2024-06-02 DIAGNOSIS — E1122 Type 2 diabetes mellitus with diabetic chronic kidney disease: Secondary | ICD-10-CM | POA: Diagnosis present

## 2024-06-02 DIAGNOSIS — Z7984 Long term (current) use of oral hypoglycemic drugs: Secondary | ICD-10-CM

## 2024-06-02 DIAGNOSIS — Z6826 Body mass index (BMI) 26.0-26.9, adult: Secondary | ICD-10-CM

## 2024-06-02 DIAGNOSIS — E43 Unspecified severe protein-calorie malnutrition: Secondary | ICD-10-CM | POA: Diagnosis present

## 2024-06-02 DIAGNOSIS — N183 Chronic kidney disease, stage 3 unspecified: Secondary | ICD-10-CM | POA: Diagnosis present

## 2024-06-02 DIAGNOSIS — E7800 Pure hypercholesterolemia, unspecified: Secondary | ICD-10-CM | POA: Diagnosis present

## 2024-06-02 DIAGNOSIS — J9811 Atelectasis: Secondary | ICD-10-CM | POA: Diagnosis present

## 2024-06-02 DIAGNOSIS — R180 Malignant ascites: Secondary | ICD-10-CM | POA: Diagnosis present

## 2024-06-02 DIAGNOSIS — Z79899 Other long term (current) drug therapy: Secondary | ICD-10-CM

## 2024-06-02 DIAGNOSIS — Z96653 Presence of artificial knee joint, bilateral: Secondary | ICD-10-CM | POA: Diagnosis present

## 2024-06-02 DIAGNOSIS — R140 Abdominal distension (gaseous): Secondary | ICD-10-CM | POA: Diagnosis present

## 2024-06-02 DIAGNOSIS — Z87891 Personal history of nicotine dependence: Secondary | ICD-10-CM

## 2024-06-02 DIAGNOSIS — C8290 Follicular lymphoma, unspecified, unspecified site: Secondary | ICD-10-CM | POA: Diagnosis present

## 2024-06-02 DIAGNOSIS — I129 Hypertensive chronic kidney disease with stage 1 through stage 4 chronic kidney disease, or unspecified chronic kidney disease: Secondary | ICD-10-CM | POA: Diagnosis present

## 2024-06-02 DIAGNOSIS — Z85828 Personal history of other malignant neoplasm of skin: Secondary | ICD-10-CM

## 2024-06-02 DIAGNOSIS — I10 Essential (primary) hypertension: Secondary | ICD-10-CM | POA: Diagnosis present

## 2024-06-02 DIAGNOSIS — E1165 Type 2 diabetes mellitus with hyperglycemia: Secondary | ICD-10-CM | POA: Diagnosis present

## 2024-06-02 DIAGNOSIS — Z8572 Personal history of non-Hodgkin lymphomas: Secondary | ICD-10-CM

## 2024-06-02 DIAGNOSIS — E8770 Fluid overload, unspecified: Secondary | ICD-10-CM | POA: Diagnosis present

## 2024-06-02 DIAGNOSIS — Z66 Do not resuscitate: Secondary | ICD-10-CM | POA: Diagnosis present

## 2024-06-02 DIAGNOSIS — E872 Acidosis, unspecified: Secondary | ICD-10-CM | POA: Diagnosis present

## 2024-06-02 DIAGNOSIS — Z7982 Long term (current) use of aspirin: Secondary | ICD-10-CM

## 2024-06-02 LAB — URINALYSIS, W/ REFLEX TO CULTURE (INFECTION SUSPECTED)
Bacteria, UA: NONE SEEN
Bilirubin Urine: NEGATIVE
Glucose, UA: NEGATIVE mg/dL
Hgb urine dipstick: NEGATIVE
Ketones, ur: NEGATIVE mg/dL
Nitrite: NEGATIVE
Protein, ur: NEGATIVE mg/dL
Specific Gravity, Urine: 1.021 (ref 1.005–1.030)
pH: 6 (ref 5.0–8.0)

## 2024-06-02 LAB — COMPREHENSIVE METABOLIC PANEL WITH GFR
ALT: 14 U/L (ref 0–44)
AST: 21 U/L (ref 15–41)
Albumin: 3.5 g/dL (ref 3.5–5.0)
Alkaline Phosphatase: 139 U/L — ABNORMAL HIGH (ref 38–126)
Anion gap: 17 — ABNORMAL HIGH (ref 5–15)
BUN: 26 mg/dL — ABNORMAL HIGH (ref 8–23)
CO2: 25 mmol/L (ref 22–32)
Calcium: 8.6 mg/dL — ABNORMAL LOW (ref 8.9–10.3)
Chloride: 101 mmol/L (ref 98–111)
Creatinine, Ser: 0.99 mg/dL (ref 0.61–1.24)
GFR, Estimated: >60 mL/min
Glucose, Bld: 154 mg/dL — ABNORMAL HIGH (ref 70–99)
Potassium: 3.7 mmol/L (ref 3.5–5.1)
Sodium: 143 mmol/L (ref 135–145)
Total Bilirubin: 0.8 mg/dL (ref 0.0–1.2)
Total Protein: 6.1 g/dL — ABNORMAL LOW (ref 6.5–8.1)

## 2024-06-02 LAB — CBC WITH DIFFERENTIAL/PLATELET
Abs Immature Granulocytes: 0.18 K/uL — ABNORMAL HIGH (ref 0.00–0.07)
Basophils Absolute: 0.1 K/uL (ref 0.0–0.1)
Basophils Relative: 0 %
Eosinophils Absolute: 0.1 K/uL (ref 0.0–0.5)
Eosinophils Relative: 0 %
HCT: 40.6 % (ref 39.0–52.0)
Hemoglobin: 12.4 g/dL — ABNORMAL LOW (ref 13.0–17.0)
Immature Granulocytes: 1 %
Lymphocytes Relative: 17 %
Lymphs Abs: 3.2 K/uL (ref 0.7–4.0)
MCH: 27.2 pg (ref 26.0–34.0)
MCHC: 30.5 g/dL (ref 30.0–36.0)
MCV: 89 fL (ref 80.0–100.0)
Monocytes Absolute: 1.6 K/uL — ABNORMAL HIGH (ref 0.1–1.0)
Monocytes Relative: 8 %
Neutro Abs: 14.1 K/uL — ABNORMAL HIGH (ref 1.7–7.7)
Neutrophils Relative %: 74 %
Platelets: 345 K/uL (ref 150–400)
RBC: 4.56 MIL/uL (ref 4.22–5.81)
RDW: 18.5 % — ABNORMAL HIGH (ref 11.5–15.5)
WBC: 19.2 K/uL — ABNORMAL HIGH (ref 4.0–10.5)
nRBC: 0 % (ref 0.0–0.2)

## 2024-06-02 LAB — GLUCOSE, CAPILLARY
Glucose-Capillary: 117 mg/dL — ABNORMAL HIGH (ref 70–99)
Glucose-Capillary: 120 mg/dL — ABNORMAL HIGH (ref 70–99)

## 2024-06-02 LAB — MRSA NEXT GEN BY PCR, NASAL: MRSA by PCR Next Gen: DETECTED — AB

## 2024-06-02 LAB — RESP PANEL BY RT-PCR (RSV, FLU A&B, COVID)  RVPGX2
Influenza A by PCR: NEGATIVE
Influenza B by PCR: NEGATIVE
Resp Syncytial Virus by PCR: NEGATIVE
SARS Coronavirus 2 by RT PCR: NEGATIVE

## 2024-06-02 LAB — LACTIC ACID, PLASMA
Lactic Acid, Venous: 3.9 mmol/L (ref 0.5–1.9)
Lactic Acid, Venous: 2.5 mmol/L (ref 0.5–1.9)

## 2024-06-02 LAB — TROPONIN T, HIGH SENSITIVITY
Troponin T High Sensitivity: 64 ng/L — ABNORMAL HIGH (ref 0–19)
Troponin T High Sensitivity: 58 ng/L — ABNORMAL HIGH (ref 0–19)

## 2024-06-02 LAB — PROTIME-INR
INR: 1.1 (ref 0.8–1.2)
Prothrombin Time: 14.6 s (ref 11.4–15.2)

## 2024-06-02 LAB — PRO BRAIN NATRIURETIC PEPTIDE: Pro Brain Natriuretic Peptide: 933 pg/mL — ABNORMAL HIGH

## 2024-06-02 MED ORDER — PROSIGHT PO TABS
1.0000 | ORAL_TABLET | Freq: Two times a day (BID) | ORAL | Status: DC
  Administered 2024-06-02 – 2024-06-07 (×10): 1 via ORAL
  Filled 2024-06-02 (×10): qty 1

## 2024-06-02 MED ORDER — AZITHROMYCIN 250 MG PO TABS
500.0000 mg | ORAL_TABLET | Freq: Every day | ORAL | Status: AC
  Administered 2024-06-03 – 2024-06-06 (×4): 500 mg via ORAL
  Filled 2024-06-02 (×4): qty 2

## 2024-06-02 MED ORDER — ACETAMINOPHEN 650 MG RE SUPP: 650.0000 mg | Freq: Four times a day (QID) | RECTAL | Status: DC | PRN

## 2024-06-02 MED ORDER — SIMETHICONE 80 MG PO CHEW
80.0000 mg | CHEWABLE_TABLET | Freq: Four times a day (QID) | ORAL | Status: DC | PRN
  Administered 2024-06-04 – 2024-06-06 (×2): 80 mg via ORAL
  Filled 2024-06-02 (×2): qty 1

## 2024-06-02 MED ORDER — LORATADINE 10 MG PO TABS
10.0000 mg | ORAL_TABLET | Freq: Every day | ORAL | Status: DC
  Administered 2024-06-02 – 2024-06-07 (×6): 10 mg via ORAL
  Filled 2024-06-02 (×6): qty 1

## 2024-06-02 MED ORDER — MUPIROCIN 2 % EX OINT
1.0000 | TOPICAL_OINTMENT | Freq: Two times a day (BID) | CUTANEOUS | Status: DC
  Administered 2024-06-02 – 2024-06-07 (×10): 1 via NASAL
  Filled 2024-06-02 (×2): qty 22

## 2024-06-02 MED ORDER — MELATONIN 3 MG PO TABS
3.0000 mg | ORAL_TABLET | Freq: Every evening | ORAL | Status: DC | PRN
  Administered 2024-06-02 – 2024-06-06 (×4): 3 mg via ORAL
  Filled 2024-06-02 (×4): qty 1

## 2024-06-02 MED ORDER — ROSUVASTATIN CALCIUM 10 MG PO TABS
10.0000 mg | ORAL_TABLET | ORAL | Status: DC
  Administered 2024-06-03 – 2024-06-07 (×3): 10 mg via ORAL
  Filled 2024-06-02 (×3): qty 1

## 2024-06-02 MED ORDER — ACETAMINOPHEN 325 MG PO TABS: 650.0000 mg | ORAL_TABLET | Freq: Four times a day (QID) | ORAL | Status: DC | PRN

## 2024-06-02 MED ORDER — PANTOPRAZOLE SODIUM 40 MG PO TBEC
40.0000 mg | DELAYED_RELEASE_TABLET | Freq: Every day | ORAL | Status: DC
  Administered 2024-06-02 – 2024-06-07 (×6): 40 mg via ORAL
  Filled 2024-06-02 (×6): qty 1

## 2024-06-02 MED ORDER — AZITHROMYCIN 250 MG PO TABS
500.0000 mg | ORAL_TABLET | Freq: Once | ORAL | Status: AC
  Administered 2024-06-02: 500 mg via ORAL
  Filled 2024-06-02: qty 2

## 2024-06-02 MED ORDER — INSULIN ASPART 100 UNIT/ML IJ SOLN
0.0000 [IU] | Freq: Three times a day (TID) | INTRAMUSCULAR | Status: DC
  Administered 2024-06-03: 1 [IU] via SUBCUTANEOUS
  Administered 2024-06-04 (×2): 2 [IU] via SUBCUTANEOUS
  Administered 2024-06-04: 1 [IU] via SUBCUTANEOUS
  Administered 2024-06-05 – 2024-06-07 (×5): 2 [IU] via SUBCUTANEOUS
  Filled 2024-06-02 (×9): qty 1

## 2024-06-02 MED ORDER — FLUTICASONE PROPIONATE 50 MCG/ACT NA SUSP
2.0000 | Freq: Every day | NASAL | Status: DC
  Administered 2024-06-03 – 2024-06-07 (×5): 2 via NASAL
  Filled 2024-06-02 (×2): qty 16

## 2024-06-02 MED ORDER — ONDANSETRON HCL 4 MG/2ML IJ SOLN: 4.0000 mg | Freq: Four times a day (QID) | INTRAMUSCULAR | Status: DC | PRN

## 2024-06-02 MED ORDER — CHLORHEXIDINE GLUCONATE CLOTH 2 % EX PADS
6.0000 | MEDICATED_PAD | Freq: Every day | CUTANEOUS | Status: DC
  Administered 2024-06-03 – 2024-06-07 (×5): 6 via TOPICAL

## 2024-06-02 MED ORDER — ENOXAPARIN SODIUM 40 MG/0.4ML IJ SOSY
40.0000 mg | PREFILLED_SYRINGE | INTRAMUSCULAR | Status: DC
  Administered 2024-06-02 – 2024-06-06 (×5): 40 mg via SUBCUTANEOUS
  Filled 2024-06-02 (×5): qty 0.4

## 2024-06-02 MED ORDER — IPRATROPIUM-ALBUTEROL 0.5-2.5 (3) MG/3ML IN SOLN: 3.0000 mL | Freq: Four times a day (QID) | RESPIRATORY_TRACT | Status: DC | PRN

## 2024-06-02 MED ORDER — FUROSEMIDE 10 MG/ML IJ SOLN
40.0000 mg | Freq: Once | INTRAMUSCULAR | Status: AC
  Administered 2024-06-02: 40 mg via INTRAVENOUS
  Filled 2024-06-02: qty 4

## 2024-06-02 MED ORDER — ASPIRIN 81 MG PO TBEC
81.0000 mg | DELAYED_RELEASE_TABLET | Freq: Every day | ORAL | Status: DC
  Administered 2024-06-02 – 2024-06-07 (×6): 81 mg via ORAL
  Filled 2024-06-02 (×6): qty 1

## 2024-06-02 MED ORDER — IOHEXOL 350 MG/ML SOLN
100.0000 mL | Freq: Once | INTRAVENOUS | Status: AC | PRN
  Administered 2024-06-02: 100 mL via INTRAVENOUS

## 2024-06-02 MED ORDER — FUROSEMIDE 10 MG/ML IJ SOLN
40.0000 mg | Freq: Two times a day (BID) | INTRAMUSCULAR | Status: DC
  Administered 2024-06-02 – 2024-06-07 (×10): 40 mg via INTRAVENOUS
  Filled 2024-06-02 (×10): qty 4

## 2024-06-02 MED ORDER — SODIUM CHLORIDE 0.9 % IV SOLN
2.0000 g | INTRAVENOUS | Status: DC
  Administered 2024-06-03 – 2024-06-07 (×5): 2 g via INTRAVENOUS
  Filled 2024-06-02 (×5): qty 20

## 2024-06-02 MED ORDER — POLYETHYLENE GLYCOL 3350 17 G PO PACK: 17.0000 g | PACK | Freq: Every day | ORAL | Status: DC | PRN

## 2024-06-02 MED ORDER — INSULIN ASPART 100 UNIT/ML IJ SOLN: 0.0000 [IU] | Freq: Every day | INTRAMUSCULAR | Status: DC

## 2024-06-02 MED ORDER — ALLOPURINOL 300 MG PO TABS
150.0000 mg | ORAL_TABLET | Freq: Every day | ORAL | Status: DC
  Administered 2024-06-02 – 2024-06-07 (×6): 150 mg via ORAL
  Filled 2024-06-02 (×6): qty 1

## 2024-06-02 MED ORDER — LABETALOL HCL 5 MG/ML IV SOLN: 10.0000 mg | INTRAVENOUS | Status: DC | PRN

## 2024-06-02 MED ORDER — ONDANSETRON HCL 4 MG PO TABS: 4.0000 mg | ORAL_TABLET | Freq: Four times a day (QID) | ORAL | Status: DC | PRN

## 2024-06-02 MED ORDER — SODIUM CHLORIDE 0.9 % IV SOLN
2.0000 g | Freq: Once | INTRAVENOUS | Status: AC
  Administered 2024-06-02: 2 g via INTRAVENOUS
  Filled 2024-06-02: qty 20

## 2024-06-02 NOTE — ED Notes (Signed)
 ED Provider at bedside.

## 2024-06-02 NOTE — H&P (Signed)
 " History and Physical    Cody Palmer FMW:[id number] DOB: 06/28/1932 DOA: 06/02/2024  PCP: [name], NP   Patient coming from: Home  Chief Complaint: Shortness of breath  HPI: Cody Palmer is a 89 y.o. male with medical history significant for CKD stage IIIa, hypertension, dyslipidemia, type 2 diabetes, and non-Hodgkin's lymphoma who presented to the ED with worsening shortness of breath over the last few days.  He would routinely get paracentesis done, but has not required 1 in the last 3 weeks.  He has noticed some leg swelling as well as some mild abdominal distention and has increased cough and fatigue.  He normally does not wear oxygen at home and denies any other symptoms of fevers, chills, abdominal pain, chest pain, or sick contacts.   ED Course: Vital signs with tachycardia and noted tachypnea, but able to speak in complete sentences and does not appear to be in significant distress.  Currently on 2 L nasal cannula oxygen given hypoxemia with 89%.  Leukocytosis of 19,200 with hemoglobin 12.4 and lactic acid of 2.5 after initial noted to be 3.9.  BNP 933.  Patient noted to have significant right sided pleural effusion with atelectasis as well as small to moderate ascites in the setting of his lymphoma.  He was noted to be 89% on room air and started on nasal cannula oxygen.  He has been started on IV Lasix  for diuresis as well as azithromycin  and Rocephin  due to concerns for pneumonia.  Review of Systems: Reviewed as noted above, otherwise negative.  Past Medical History:  Diagnosis Date   Arthritis    Cancer (HCC)    Skin cancer   Diabetes mellitus without complication (HCC)    HTN (hypertension)    Hypercholesteremia     Past Surgical History:  Procedure Laterality Date   CATARACT EXTRACTION W/PHACO Left 04/25/2016   Procedure: CATARACT EXTRACTION PHACO AND INTRAOCULAR LENS PLACEMENT LEFT EYE;  Surgeon: Cherene Mania, MD;  Location: [facility];  Service:  Ophthalmology;  Laterality: Left;  CDE: 10.37   COLONOSCOPY N/A 04/05/2024   Procedure: COLONOSCOPY;  Surgeon: Cindie Carlin POUR, DO;  Location: [facility];  Service: Endoscopy;  Laterality: N/A;   CYSTOSCOPY WITH BIOPSY N/A 08/13/2013   Procedure: CYSTOSCOPY WITH BLADDER BIOPSY;  Surgeon: Garnette Shack, MD;  Location: [facility];  Service: Urology;  Laterality: N/A;   ENUCLEATION Right    Removed in 1958   EYE SURGERY  1958   removal of eye   TOTAL KNEE ARTHROPLASTY Right 01/11/2013   Procedure: RIGHT TOTAL KNEE ARTHROPLASTY;  Surgeon: Taft FORBES Minerva, MD;  Location: [facility];  Service: Orthopedics;  Laterality: Right;   TOTAL KNEE ARTHROPLASTY Left 07/12/2022   Procedure: TOTAL KNEE ARTHROPLASTY;  Surgeon: Minerva Taft FORBES, MD;  Location: [facility];  Service: Orthopedics;  Laterality: Left;     reports that he quit smoking about 59 years ago. His smoking use included cigarettes. He started smoking about 67 years ago. He has a 8 pack-year smoking history. He has never used smokeless tobacco. He reports that he does not drink alcohol and does not use drugs.  Allergies[1]  Family History  Problem Relation Age of Onset   Arthritis Unknown    Cancer Unknown    Diabetes Unknown     Prior to Admission medications  Medication Sig Start Date End Date Taking? Authorizing Provider  allopurinol  (ZYLOPRIM ) 300 MG tablet Take 0.5 tablets (150 mg total) by mouth daily. 05/23/24   Landy,  Barnie RAMAN, NP  aspirin  EC 81 MG tablet Take 81 mg by mouth daily. Swallow whole.    [provider]  Cholecalciferol  (VITAMIN D3) 25 MCG (1000 UT) CAPS Take 1,000 Units by mouth daily.    [provider]  fluticasone  (FLONASE ) 50 MCG/ACT nasal spray Place 2 sprays into both nostrils daily. 04/25/24   Ricky Fines, MD  furosemide  (LASIX ) 40 MG tablet Take 1 tablet (40 mg total) by mouth daily as needed (Take in am for swelling). 05/23/24   Landy Barnie RAMAN, NP  loratadine  (CLARITIN ) 10 MG tablet  Take 1 tablet (10 mg total) by mouth daily. 04/25/24   Ricky Fines, MD  Multiple Vitamins-Minerals (EYE HEALTH AREDS 2 PO) Take 1 capsule by mouth 2 (two) times daily.    [provider]  Nutritional Supplements (GLUCERNA 1.0 CAL/FIBER) LIQD Take 1 Bottle by mouth 2 (two) times daily between meals. 04/24/24   Ricky Fines, MD  Nutritional Supplements (PROSOURCE) LIQD Take 30 mLs by mouth 3 (three) times daily.    [provider]  omeprazole  (PRILOSEC) 40 MG capsule Take 1 capsule (40 mg total) by mouth daily. 05/23/24   Landy Barnie RAMAN, NP  polyethylene glycol (MIRALAX ) 17 g packet Take 17 g by mouth daily as needed for mild constipation or moderate constipation. 04/06/24   Maree, Deana Krock D, DO  prochlorperazine  (COMPAZINE ) 10 MG tablet Take 1 tablet (10 mg total) by mouth every 6 (six) hours as needed for nausea or vomiting. 05/23/24   Landy Barnie RAMAN, NP  rosuvastatin  (CRESTOR ) 10 MG tablet Take 1 tablet (10 mg total) by mouth every other day. 05/23/24   Landy Barnie RAMAN, NP  simethicone  (MYLICON) 80 MG chewable tablet Chew 1 tablet (80 mg total) by mouth every 6 (six) hours as needed for flatulence. 04/06/24   Maree, Candyce Gambino D, DO  SITagliptin  25 MG TABS Take 12.5 mg by mouth daily at 6 (six) AM. 05/23/24   Landy Barnie RAMAN, NP    Physical Exam: Vitals:   06/02/24 0900 06/02/24 1000 06/02/24 1030 06/02/24 1100  BP: 121/78 138/77 131/78 132/82  Pulse: (!) 129 (!) 123 (!) 122 (!) 116  Resp: (!) 26 (!) 27 (!) 27 (!) 26  Temp:      TempSrc:      SpO2: 97% 96% 96% 96%  Weight:      Height:        Constitutional: NAD, calm, comfortable Vitals:   06/02/24 0900 06/02/24 1000 06/02/24 1030 06/02/24 1100  BP: 121/78 138/77 131/78 132/82  Pulse: (!) 129 (!) 123 (!) 122 (!) 116  Resp: (!) 26 (!) 27 (!) 27 (!) 26  Temp:      TempSrc:      SpO2: 97% 96% 96% 96%  Weight:      Height:       Eyes: lids and conjunctivae normal Neck: normal, supple Respiratory: Diminished  bilaterally. Normal respiratory effort. No accessory muscle use.  2 L nasal cannula oxygen Cardiovascular: Regular rate and rhythm, no murmurs. Abdomen: no tenderness, no distention. Bowel sounds positive.  Musculoskeletal:  No edema. Skin: no rashes, lesions, ulcers.  Psychiatric: Flat affect  Labs on Admission: I have personally reviewed following labs and imaging studies  CBC: Recent Labs  Lab 05/28/24 0803 06/02/24 0842  WBC 9.9 19.2*  NEUTROABS 6.5 14.1*  HGB 10.1* 12.4*  HCT 33.0* 40.6  MCV 88.7 89.0  PLT 239 345   Basic Metabolic Panel: Recent Labs  Lab 05/28/24 0803 06/02/24  0842  NA 143 143  K 3.3* 3.7  CL 103 101  CO2 27 25  GLUCOSE 137* 154*  BUN 16 26*  CREATININE 1.09 0.99  CALCIUM  8.5* 8.6*  MG 1.7  --    GFR: Estimated Creatinine Clearance: 50.6 mL/min (by C-G formula based on SCr of 0.99 mg/dL). Liver Function Tests: Recent Labs  Lab 05/28/24 0803 06/02/24 0842  AST 23 21  ALT 14 14  ALKPHOS 178* 139*  BILITOT 0.7 0.8  PROT 5.5* 6.1*  ALBUMIN  3.4* 3.5   No results for input(s): LIPASE, AMYLASE in the last 168 hours. No results for input(s): AMMONIA in the last 168 hours. Coagulation Profile: Recent Labs  Lab 06/02/24 0842  INR 1.1   Cardiac Enzymes: No results for input(s): CKTOTAL, CKMB, CKMBINDEX, TROPONINI in the last 168 hours. BNP (last 3 results) Recent Labs    06/02/24 0911  PROBNP 933.0*   HbA1C: No results for input(s): HGBA1C in the last 72 hours. CBG: No results for input(s): GLUCAP in the last 168 hours. Lipid Profile: No results for input(s): CHOL, HDL, LDLCALC, TRIG, CHOLHDL, LDLDIRECT in the last 72 hours. Thyroid Function Tests: No results for input(s): TSH, T4TOTAL, FREET4, T3FREE, THYROIDAB in the last 72 hours. Anemia Panel: No results for input(s): VITAMINB12, FOLATE, FERRITIN, TIBC, IRON, RETICCTPCT in the last 72 hours. Urine analysis:    Component  Value Date/Time   COLORURINE YELLOW 06/02/2024 1127   APPEARANCEUR CLEAR 06/02/2024 1127   LABSPEC 1.021 06/02/2024 1127   PHURINE 6.0 06/02/2024 1127   GLUCOSEU NEGATIVE 06/02/2024 1127   HGBUR NEGATIVE 06/02/2024 1127   BILIRUBINUR NEGATIVE 06/02/2024 1127   KETONESUR NEGATIVE 06/02/2024 1127   PROTEINUR NEGATIVE 06/02/2024 1127   UROBILINOGEN 2.0 (H) 07/04/2013 1917   NITRITE NEGATIVE 06/02/2024 1127   LEUKOCYTESUR SMALL (A) 06/02/2024 1127    Radiological Exams on Admission: CT Angio Chest Pulmonary Embolism (PE) W or WO Contrast Result Date: 06/02/2024 EXAM: CTA CHEST PE WITHOUT AND WITH CONTRAST CT ABDOMEN AND PELVIS WITHOUT AND WITH CONTRAST 06/02/2024 09:43:31 AM TECHNIQUE: CTA of the chest was performed after the administration of intravenous contrast. Multiplanar reformatted images are provided for review. MIP images are provided for review. CT of the abdomen and pelvis was performed without and with the administration of intravenous contrast. Automated exposure control, iterative reconstruction, and/or weight based adjustment of the mA/kV was utilized to reduce the radiation dose to as low as reasonably achievable. COMPARISON: PET/CT 05/02/2024. CLINICAL HISTORY: Lymphoma. Patient presents with shortness of breath and abdominal distention. FINDINGS: CHEST: PULMONARY ARTERIES: Pulmonary arteries are adequately opacified for evaluation. No intraluminal filling defect to suggest pulmonary embolism. Main pulmonary artery is normal in caliber. MEDIASTINUM: No mediastinal lymphadenopathy. No axillary or supraclavicular adenopathy. Previous right costophrenic (CP) angle lymph node measures 1.5 cm (image 101/5), previously 1.6 cm. The heart and pericardium demonstrate no acute abnormality. Aortic atherosclerosis and coronary artery calcification. There is no acute abnormality of the thoracic aorta. LUNGS AND PLEURA: There is a large right pleural effusion which is significantly increased from  previous exam with near complete atelectasis of the right lung with significantly reduced aeration to the right middle lobe, right upper lobe, and right lower lobe. Diffuse hazy ground-glass opacities are noted within the left lung, with a few scattered areas of air trapping in the lower lung zone, suggesting small airways disease. No pneumothorax. SOFT TISSUES AND BONES: No acute bone or soft tissue abnormality. ABDOMEN AND PELVIS: LIVER: Posterior the right lobe of the  liver cyst is unchanged, measuring 1.4 cm. A focal hemangioma is noted within the anterior right hepatic lobe, unchanged measuring 2.1 cm. GALLBLADDER AND BILE DUCTS: Tiny stone noted within the gallbladder measuring 3 mm. Gallbladder wall thickening measures 5 mm in thickness. Small amount of pericholecystic fluid. No biliary ductal dilatation. SPLEEN: The spleen is within normal limits in size and appearance. PANCREAS: The pancreas is normal in size and contour without a focal lesion or ductal dilatation. ADRENAL GLANDS: Normal size and morphology bilaterally. No nodule, thickening, or hemorrhage. No periadrenal stranding. KIDNEYS, URETERS AND BLADDER: Bilateral pelvocaliectasis. No obstructing stones or mass noted. No perinephric or periureteral stranding. Bladder diverticula noted right dome of bladder diverticula measures 3.9 cm. GI AND BOWEL: Stomach appears normal. There is no pathologic dilatation of the large or small bowel loops. There is wall thickening involving the right lower quadrant small bowel loops, which have wall thickness measuring up to 7 mm. Wall thickening involving the cecum, ascending, and proximal transverse colon are identified which subjectively may be improved from 04/15/2024. Wall thickening with surrounding soft tissue stranding is noted involving the left colon from the splenic flexure to the mid sigmoid colon. Extensive left-sided colonic diverticulosis without signs of acute diverticulitis. REPRODUCTIVE:  Reproductive organs are unremarkable. PERITONEUM AND RETROPERITONEUM: Soft tissue infiltration which involves the root of mesentery and extending into the right lower quadrant. Ileocolic mesentery is again noted but appears improved in the interval. The central mesenteric tumor measures 8.1 x 2.2 cm (image 50/2), on the previous exam this measured 14.5 x 6.5 cm. Tumor within the right lower quadrant ileocolic mesentery encasing the vascular pedicle for the right lower quadrant small bowel loops and right colon has also decreased in the interval. This measures 6.8 x 3.9 cm (image 57/2), previously 10.9 x 9.1 cm. There is a small volume moderate ascites overlying the liver. Compared with the prior imaging the volume of ascites within the abdomen and pelvis is decreased in the interval with resolution of free fluid within the pelvis. There are no signs of bowel perforation or abscess. No free air. LYMPH NODES: Left periaortic lymph node measures 1.2 cm (image 42/2). Previously 2.3 cm. BONES AND SOFT TISSUES: Lumbar degenerative disc disease. No acute abnormality of the visualized bones. No focal soft tissue abnormality. IMPRESSION: 1. No evidence of acute pulmonary embolism. 2. Large right pleural effusion, significantly increased from prior, with near complete right lung atelectasis, likely accounting for shortness of breath; consider malignant effusion (given lymphoma), heart failure, or infection, and consider diagnostic and therapeutic thoracentesis with follow-up chest imaging after drainage. 3. Diffuse hazy left lung ground-glass opacities with scattered air trapping, which may reflect pulmonary edema and/or small airways disease. 4. Small-to-moderate ascites, decreased from prior, which may contribute to abdominal distention. 5. Interval decrease in mesenteric tumor burden and retroperitoneal/porta hepatis adenopathy, compatible with treatment response; continue oncologic follow-up imaging per lymphoma protocol.  6. Persistent bowel wall thickening involving right lower quadrant small bowel and colon with surrounding stranding, overall subjectively improved on the right, with differential including enterocolitis, or lymphomatous involvement. 7. No signs of bowel obstruction, perforation, or abscess formation. 8. Wall thickening and pericholecystic fluid involving the gallbladder. This is nonspecific in the setting of ascites. Correlate for any clinical signs or symptoms of cholecystitis. Electronically signed by: Waddell Calk MD 06/02/2024 10:34 AM EST RP Workstation: [id number]   CT ABDOMEN PELVIS W CONTRAST Result Date: 06/02/2024 EXAM: CTA CHEST PE WITHOUT AND WITH CONTRAST CT ABDOMEN AND PELVIS WITHOUT AND  WITH CONTRAST 06/02/2024 09:43:31 AM TECHNIQUE: CTA of the chest was performed after the administration of intravenous contrast. Multiplanar reformatted images are provided for review. MIP images are provided for review. CT of the abdomen and pelvis was performed without and with the administration of intravenous contrast. Automated exposure control, iterative reconstruction, and/or weight based adjustment of the mA/kV was utilized to reduce the radiation dose to as low as reasonably achievable. COMPARISON: PET/CT 05/02/2024. CLINICAL HISTORY: Lymphoma. Patient presents with shortness of breath and abdominal distention. FINDINGS: CHEST: PULMONARY ARTERIES: Pulmonary arteries are adequately opacified for evaluation. No intraluminal filling defect to suggest pulmonary embolism. Main pulmonary artery is normal in caliber. MEDIASTINUM: No mediastinal lymphadenopathy. No axillary or supraclavicular adenopathy. Previous right costophrenic (CP) angle lymph node measures 1.5 cm (image 101/5), previously 1.6 cm. The heart and pericardium demonstrate no acute abnormality. Aortic atherosclerosis and coronary artery calcification. There is no acute abnormality of the thoracic aorta. LUNGS AND PLEURA: There is a large right  pleural effusion which is significantly increased from previous exam with near complete atelectasis of the right lung with significantly reduced aeration to the right middle lobe, right upper lobe, and right lower lobe. Diffuse hazy ground-glass opacities are noted within the left lung, with a few scattered areas of air trapping in the lower lung zone, suggesting small airways disease. No pneumothorax. SOFT TISSUES AND BONES: No acute bone or soft tissue abnormality. ABDOMEN AND PELVIS: LIVER: Posterior the right lobe of the liver cyst is unchanged, measuring 1.4 cm. A focal hemangioma is noted within the anterior right hepatic lobe, unchanged measuring 2.1 cm. GALLBLADDER AND BILE DUCTS: Tiny stone noted within the gallbladder measuring 3 mm. Gallbladder wall thickening measures 5 mm in thickness. Small amount of pericholecystic fluid. No biliary ductal dilatation. SPLEEN: The spleen is within normal limits in size and appearance. PANCREAS: The pancreas is normal in size and contour without a focal lesion or ductal dilatation. ADRENAL GLANDS: Normal size and morphology bilaterally. No nodule, thickening, or hemorrhage. No periadrenal stranding. KIDNEYS, URETERS AND BLADDER: Bilateral pelvocaliectasis. No obstructing stones or mass noted. No perinephric or periureteral stranding. Bladder diverticula noted right dome of bladder diverticula measures 3.9 cm. GI AND BOWEL: Stomach appears normal. There is no pathologic dilatation of the large or small bowel loops. There is wall thickening involving the right lower quadrant small bowel loops, which have wall thickness measuring up to 7 mm. Wall thickening involving the cecum, ascending, and proximal transverse colon are identified which subjectively may be improved from 04/15/2024. Wall thickening with surrounding soft tissue stranding is noted involving the left colon from the splenic flexure to the mid sigmoid colon. Extensive left-sided colonic diverticulosis without  signs of acute diverticulitis. REPRODUCTIVE: Reproductive organs are unremarkable. PERITONEUM AND RETROPERITONEUM: Soft tissue infiltration which involves the root of mesentery and extending into the right lower quadrant. Ileocolic mesentery is again noted but appears improved in the interval. The central mesenteric tumor measures 8.1 x 2.2 cm (image 50/2), on the previous exam this measured 14.5 x 6.5 cm. Tumor within the right lower quadrant ileocolic mesentery encasing the vascular pedicle for the right lower quadrant small bowel loops and right colon has also decreased in the interval. This measures 6.8 x 3.9 cm (image 57/2), previously 10.9 x 9.1 cm. There is a small volume moderate ascites overlying the liver. Compared with the prior imaging the volume of ascites within the abdomen and pelvis is decreased in the interval with resolution of free fluid within the pelvis. There  are no signs of bowel perforation or abscess. No free air. LYMPH NODES: Left periaortic lymph node measures 1.2 cm (image 42/2). Previously 2.3 cm. BONES AND SOFT TISSUES: Lumbar degenerative disc disease. No acute abnormality of the visualized bones. No focal soft tissue abnormality. IMPRESSION: 1. No evidence of acute pulmonary embolism. 2. Large right pleural effusion, significantly increased from prior, with near complete right lung atelectasis, likely accounting for shortness of breath; consider malignant effusion (given lymphoma), heart failure, or infection, and consider diagnostic and therapeutic thoracentesis with follow-up chest imaging after drainage. 3. Diffuse hazy left lung ground-glass opacities with scattered air trapping, which may reflect pulmonary edema and/or small airways disease. 4. Small-to-moderate ascites, decreased from prior, which may contribute to abdominal distention. 5. Interval decrease in mesenteric tumor burden and retroperitoneal/porta hepatis adenopathy, compatible with treatment response; continue  oncologic follow-up imaging per lymphoma protocol. 6. Persistent bowel wall thickening involving right lower quadrant small bowel and colon with surrounding stranding, overall subjectively improved on the right, with differential including enterocolitis, or lymphomatous involvement. 7. No signs of bowel obstruction, perforation, or abscess formation. 8. Wall thickening and pericholecystic fluid involving the gallbladder. This is nonspecific in the setting of ascites. Correlate for any clinical signs or symptoms of cholecystitis. Electronically signed by: Waddell Calk MD 06/02/2024 10:34 AM EST RP Workstation: [id number]   DG Chest 1 View Result Date: 06/02/2024 EXAM: 1 VIEW(S) XRAY OF THE CHEST 06/02/2024 09:08:49 AM COMPARISON: PET CT 05/30/2024. CLINICAL HISTORY: sob FINDINGS: LUNGS AND PLEURA: Large right pleural effusion with atelectatic changes involving the right lung is unchanged from PET/CT dated 10/28/2024. Left lung appears clear. No pneumothorax. HEART AND MEDIASTINUM: Aortic atherosclerosis. Stable cardiomediastinal contours. BONES AND SOFT TISSUES: No acute osseous abnormality. IMPRESSION: 1. Large right pleural effusion with atelectatic changes involving the right lung. Electronically signed by: Waddell Calk MD 06/02/2024 09:42 AM EST RP Workstation: [id number]    EKG: Independently reviewed. ST 125bpm.  Assessment/Plan Principal Problem:   Acute hypoxemic respiratory failure (HCC) Active Problems:   Essential hypertension   Abdominal distention   DNR (do not resuscitate)   Follicular lymphoma (HCC)   CKD stage 3 due to type 2 diabetes mellitus (HCC)    Acute hypoxemic respiratory failure secondary to large right pleural effusion with atelectasis - Plan for ultrasound thoracentesis in a.m. - Monitor in stepdown unit and utilize BiPAP as needed for respiratory distress - Continue IV Lasix  for diuresis 40 mg twice daily - Continue nasal cannula support  Sepsis, POA secondary to  pneumonia - Continue IV antibiotics with azithromycin  and Rocephin  - Monitor CBC and lactic acid - Further culture and Gram stain with thoracentesis in a.m.  Non-Hodgkin lymphoma - Follows with Dr. Davonna and has recently had PET scan and results are pending  Recurring ascites likely secondary to lymphoma - Small to moderate on abdominal imaging with no tense abdomen or signs of SBP - Hold on paracentesis for now  CKD stage IIIb - Current creatinine near baseline  Hypertension - Currently stable, monitor with IV diuresis and hold other antihypertensives  Type 2 diabetes-with mild hyperglycemia - Carb modified diet and SSI   DVT prophylaxis: Lovenox  Code Status: DNR Family Communication: Son-in-law at bedside 1/18 Disposition Plan: Admit for shortness of breath with pleural effusion Consults called: None Admission status: Observation, stepdown  Severity of Illness: The appropriate patient status for this patient is OBSERVATION. Observation status is judged to be reasonable and necessary in order to provide the required intensity of service to ensure  the patient's safety. The patient's presenting symptoms, physical exam findings, and initial radiographic and laboratory data in the context of their medical condition is felt to place them at decreased risk for further clinical deterioration. Furthermore, it is anticipated that the patient will be medically stable for discharge from the hospital within 2 midnights of admission.    Cody Palmer D Maree DO [facility]  If 7PM-7AM, please contact night-coverage [email or web address]  06/02/2024, 2:08 PM       [1]  Allergies Allergen Reactions   Rituximab  Shortness Of Breath and Cough    Patient had hypersensitivity reaction to rituximab  which included SHOB, cough, throat tightness. Emergency Pepcid , Benadryl , Solu-Medrol  given.    "

## 2024-06-02 NOTE — ED Notes (Signed)
 Lactic acid 3.9 EDP notified

## 2024-06-02 NOTE — ED Provider Notes (Signed)
 " [facility] EMERGENCY DEPARTMENT AT [facility] Provider Note   CSN: [id number] Arrival date & time: 06/02/24  9193     Patient presents with: Shortness of Breath   Cody Palmer is a 89 y.o. male.   89 year old male presents for evaluation of shortness of breath.  He has a history of follicular lymphoma.  States he used to get frequent paracentesis but has not needed one for 3 weeks.  States he has been short of breath and noticed increased leg swelling the last few days.  Has also noticed some abdominal distention.  He also admits to increased cough and fatigue.  Is not normally on oxygen at home and was 89% on room air on arrival.  Denies any other symptoms or concerns.  Denies any abdominal pain or nausea or vomiting.   Shortness of Breath Associated symptoms: cough   Associated symptoms: no abdominal pain, no chest pain, no ear pain, no fever, no rash, no sore throat and no vomiting        Prior to Admission medications  Medication Sig Start Date End Date Taking? Authorizing Provider  allopurinol  (ZYLOPRIM ) 300 MG tablet Take 0.5 tablets (150 mg total) by mouth daily. 05/23/24   Landy Barnie RAMAN, NP  aspirin  EC 81 MG tablet Take 81 mg by mouth daily. Swallow whole.    [provider]  Cholecalciferol  (VITAMIN D3) 25 MCG (1000 UT) CAPS Take 1,000 Units by mouth daily.    [provider]  fluticasone  (FLONASE ) 50 MCG/ACT nasal spray Place 2 sprays into both nostrils daily. 04/25/24   Ricky Fines, MD  furosemide  (LASIX ) 40 MG tablet Take 1 tablet (40 mg total) by mouth daily as needed (Take in am for swelling). 05/23/24   Landy Barnie RAMAN, NP  loratadine  (CLARITIN ) 10 MG tablet Take 1 tablet (10 mg total) by mouth daily. 04/25/24   Ricky Fines, MD  Multiple Vitamins-Minerals (EYE HEALTH AREDS 2 PO) Take 1 capsule by mouth 2 (two) times daily.    [provider]  Nutritional Supplements (GLUCERNA 1.0 CAL/FIBER) LIQD Take 1 Bottle by mouth 2  (two) times daily between meals. 04/24/24   Ricky Fines, MD  Nutritional Supplements (PROSOURCE) LIQD Take 30 mLs by mouth 3 (three) times daily.    [provider]  omeprazole  (PRILOSEC) 40 MG capsule Take 1 capsule (40 mg total) by mouth daily. 05/23/24   Landy Barnie RAMAN, NP  polyethylene glycol (MIRALAX ) 17 g packet Take 17 g by mouth daily as needed for mild constipation or moderate constipation. 04/06/24   Maree, Pratik D, DO  prochlorperazine  (COMPAZINE ) 10 MG tablet Take 1 tablet (10 mg total) by mouth every 6 (six) hours as needed for nausea or vomiting. 05/23/24   Landy Barnie RAMAN, NP  rosuvastatin  (CRESTOR ) 10 MG tablet Take 1 tablet (10 mg total) by mouth every other day. 05/23/24   Landy Barnie RAMAN, NP  simethicone  (MYLICON) 80 MG chewable tablet Chew 1 tablet (80 mg total) by mouth every 6 (six) hours as needed for flatulence. 04/06/24   Maree, Pratik D, DO  SITagliptin  25 MG TABS Take 12.5 mg by mouth daily at 6 (six) AM. 05/23/24   Landy, Barnie RAMAN, NP    Allergies: Rituximab     Review of Systems  Constitutional:  Positive for fatigue. Negative for chills and fever.  HENT:  Negative for ear pain and sore throat.   Eyes:  Negative for pain and visual disturbance.  Respiratory:  Positive for cough  and shortness of breath.   Cardiovascular:  Positive for leg swelling. Negative for chest pain and palpitations.  Gastrointestinal:  Negative for abdominal pain and vomiting.  Genitourinary:  Negative for dysuria and hematuria.  Musculoskeletal:  Negative for arthralgias and back pain.  Skin:  Negative for color change and rash.  Neurological:  Negative for seizures and syncope.  All other systems reviewed and are negative.   Updated Vital Signs BP (!) 140/85   Pulse (!) 114   Temp 97.6 F (36.4 C) (Oral)   Resp 16   Ht 5' 9 (1.753 m)   Wt 81.2 kg   SpO2 97%   BMI 26.43 kg/m   Physical Exam Vitals and nursing note reviewed.  Constitutional:      General: He is not  in acute distress.    Appearance: He is well-developed. He is ill-appearing.  HENT:     Head: Normocephalic and atraumatic.  Eyes:     Conjunctiva/sclera: Conjunctivae normal.  Cardiovascular:     Rate and Rhythm: Normal rate and regular rhythm.     Heart sounds: No murmur heard. Pulmonary:     Effort: Pulmonary effort is normal. No respiratory distress.     Breath sounds: Decreased breath sounds present. No wheezing or rhonchi.  Abdominal:     Palpations: Abdomen is soft.     Tenderness: There is no abdominal tenderness.     Comments: Abdomen is distended but nontender, soft  Musculoskeletal:        General: No swelling.     Cervical back: Neck supple.     Right lower leg: Edema present.     Left lower leg: Edema present.  Skin:    General: Skin is warm and dry.     Capillary Refill: Capillary refill takes less than 2 seconds.  Neurological:     General: No focal deficit present.     Mental Status: He is alert.  Psychiatric:        Mood and Affect: Mood normal.     (all labs ordered are listed, but only abnormal results are displayed) Labs Reviewed  COMPREHENSIVE METABOLIC PANEL WITH GFR - Abnormal; Notable for the following components:      Result Value   Glucose, Bld 154 (*)    BUN 26 (*)    Calcium  8.6 (*)    Total Protein 6.1 (*)    Alkaline Phosphatase 139 (*)    Anion gap 17 (*)    All other components within normal limits  LACTIC ACID, PLASMA - Abnormal; Notable for the following components:   Lactic Acid, Venous 3.9 (*)    All other components within normal limits  LACTIC ACID, PLASMA - Abnormal; Notable for the following components:   Lactic Acid, Venous 2.5 (*)    All other components within normal limits  CBC WITH DIFFERENTIAL/PLATELET - Abnormal; Notable for the following components:   WBC 19.2 (*)    Hemoglobin 12.4 (*)    RDW 18.5 (*)    Neutro Abs 14.1 (*)    Monocytes Absolute 1.6 (*)    Abs Immature Granulocytes 0.18 (*)    All other  components within normal limits  PRO BRAIN NATRIURETIC PEPTIDE - Abnormal; Notable for the following components:   Pro Brain Natriuretic Peptide 933.0 (*)    All other components within normal limits  URINALYSIS, W/ REFLEX TO CULTURE (INFECTION SUSPECTED) - Abnormal; Notable for the following components:   Leukocytes,Ua SMALL (*)    All other components within  normal limits  TROPONIN T, HIGH SENSITIVITY - Abnormal; Notable for the following components:   Troponin T High Sensitivity 64 (*)    All other components within normal limits  TROPONIN T, HIGH SENSITIVITY - Abnormal; Notable for the following components:   Troponin T High Sensitivity 58 (*)    All other components within normal limits  CULTURE, BLOOD (ROUTINE X 2)  CULTURE, BLOOD (ROUTINE X 2)  RESP PANEL BY RT-PCR (RSV, FLU A&B, COVID)  RVPGX2  URINE CULTURE  MRSA NEXT GEN BY PCR, NASAL  MRSA NEXT GEN BY PCR, NASAL  PROTIME-INR    EKG: None  Radiology: CT Angio Chest Pulmonary Embolism (PE) W or WO Contrast Result Date: 06/02/2024 EXAM: CTA CHEST PE WITHOUT AND WITH CONTRAST CT ABDOMEN AND PELVIS WITHOUT AND WITH CONTRAST 06/02/2024 09:43:31 AM TECHNIQUE: CTA of the chest was performed after the administration of intravenous contrast. Multiplanar reformatted images are provided for review. MIP images are provided for review. CT of the abdomen and pelvis was performed without and with the administration of intravenous contrast. Automated exposure control, iterative reconstruction, and/or weight based adjustment of the mA/kV was utilized to reduce the radiation dose to as low as reasonably achievable. COMPARISON: PET/CT 05/02/2024. CLINICAL HISTORY: Lymphoma. Patient presents with shortness of breath and abdominal distention. FINDINGS: CHEST: PULMONARY ARTERIES: Pulmonary arteries are adequately opacified for evaluation. No intraluminal filling defect to suggest pulmonary embolism. Main pulmonary artery is normal in caliber.  MEDIASTINUM: No mediastinal lymphadenopathy. No axillary or supraclavicular adenopathy. Previous right costophrenic (CP) angle lymph node measures 1.5 cm (image 101/5), previously 1.6 cm. The heart and pericardium demonstrate no acute abnormality. Aortic atherosclerosis and coronary artery calcification. There is no acute abnormality of the thoracic aorta. LUNGS AND PLEURA: There is a large right pleural effusion which is significantly increased from previous exam with near complete atelectasis of the right lung with significantly reduced aeration to the right middle lobe, right upper lobe, and right lower lobe. Diffuse hazy ground-glass opacities are noted within the left lung, with a few scattered areas of air trapping in the lower lung zone, suggesting small airways disease. No pneumothorax. SOFT TISSUES AND BONES: No acute bone or soft tissue abnormality. ABDOMEN AND PELVIS: LIVER: Posterior the right lobe of the liver cyst is unchanged, measuring 1.4 cm. A focal hemangioma is noted within the anterior right hepatic lobe, unchanged measuring 2.1 cm. GALLBLADDER AND BILE DUCTS: Tiny stone noted within the gallbladder measuring 3 mm. Gallbladder wall thickening measures 5 mm in thickness. Small amount of pericholecystic fluid. No biliary ductal dilatation. SPLEEN: The spleen is within normal limits in size and appearance. PANCREAS: The pancreas is normal in size and contour without a focal lesion or ductal dilatation. ADRENAL GLANDS: Normal size and morphology bilaterally. No nodule, thickening, or hemorrhage. No periadrenal stranding. KIDNEYS, URETERS AND BLADDER: Bilateral pelvocaliectasis. No obstructing stones or mass noted. No perinephric or periureteral stranding. Bladder diverticula noted right dome of bladder diverticula measures 3.9 cm. GI AND BOWEL: Stomach appears normal. There is no pathologic dilatation of the large or small bowel loops. There is wall thickening involving the right lower quadrant  small bowel loops, which have wall thickness measuring up to 7 mm. Wall thickening involving the cecum, ascending, and proximal transverse colon are identified which subjectively may be improved from 04/15/2024. Wall thickening with surrounding soft tissue stranding is noted involving the left colon from the splenic flexure to the mid sigmoid colon. Extensive left-sided colonic diverticulosis without signs of acute diverticulitis.  REPRODUCTIVE: Reproductive organs are unremarkable. PERITONEUM AND RETROPERITONEUM: Soft tissue infiltration which involves the root of mesentery and extending into the right lower quadrant. Ileocolic mesentery is again noted but appears improved in the interval. The central mesenteric tumor measures 8.1 x 2.2 cm (image 50/2), on the previous exam this measured 14.5 x 6.5 cm. Tumor within the right lower quadrant ileocolic mesentery encasing the vascular pedicle for the right lower quadrant small bowel loops and right colon has also decreased in the interval. This measures 6.8 x 3.9 cm (image 57/2), previously 10.9 x 9.1 cm. There is a small volume moderate ascites overlying the liver. Compared with the prior imaging the volume of ascites within the abdomen and pelvis is decreased in the interval with resolution of free fluid within the pelvis. There are no signs of bowel perforation or abscess. No free air. LYMPH NODES: Left periaortic lymph node measures 1.2 cm (image 42/2). Previously 2.3 cm. BONES AND SOFT TISSUES: Lumbar degenerative disc disease. No acute abnormality of the visualized bones. No focal soft tissue abnormality. IMPRESSION: 1. No evidence of acute pulmonary embolism. 2. Large right pleural effusion, significantly increased from prior, with near complete right lung atelectasis, likely accounting for shortness of breath; consider malignant effusion (given lymphoma), heart failure, or infection, and consider diagnostic and therapeutic thoracentesis with follow-up chest  imaging after drainage. 3. Diffuse hazy left lung ground-glass opacities with scattered air trapping, which may reflect pulmonary edema and/or small airways disease. 4. Small-to-moderate ascites, decreased from prior, which may contribute to abdominal distention. 5. Interval decrease in mesenteric tumor burden and retroperitoneal/porta hepatis adenopathy, compatible with treatment response; continue oncologic follow-up imaging per lymphoma protocol. 6. Persistent bowel wall thickening involving right lower quadrant small bowel and colon with surrounding stranding, overall subjectively improved on the right, with differential including enterocolitis, or lymphomatous involvement. 7. No signs of bowel obstruction, perforation, or abscess formation. 8. Wall thickening and pericholecystic fluid involving the gallbladder. This is nonspecific in the setting of ascites. Correlate for any clinical signs or symptoms of cholecystitis. Electronically signed by: Waddell Calk MD 06/02/2024 10:34 AM EST RP Workstation: [id number]   CT ABDOMEN PELVIS W CONTRAST Result Date: 06/02/2024 EXAM: CTA CHEST PE WITHOUT AND WITH CONTRAST CT ABDOMEN AND PELVIS WITHOUT AND WITH CONTRAST 06/02/2024 09:43:31 AM TECHNIQUE: CTA of the chest was performed after the administration of intravenous contrast. Multiplanar reformatted images are provided for review. MIP images are provided for review. CT of the abdomen and pelvis was performed without and with the administration of intravenous contrast. Automated exposure control, iterative reconstruction, and/or weight based adjustment of the mA/kV was utilized to reduce the radiation dose to as low as reasonably achievable. COMPARISON: PET/CT 05/02/2024. CLINICAL HISTORY: Lymphoma. Patient presents with shortness of breath and abdominal distention. FINDINGS: CHEST: PULMONARY ARTERIES: Pulmonary arteries are adequately opacified for evaluation. No intraluminal filling defect to suggest pulmonary  embolism. Main pulmonary artery is normal in caliber. MEDIASTINUM: No mediastinal lymphadenopathy. No axillary or supraclavicular adenopathy. Previous right costophrenic (CP) angle lymph node measures 1.5 cm (image 101/5), previously 1.6 cm. The heart and pericardium demonstrate no acute abnormality. Aortic atherosclerosis and coronary artery calcification. There is no acute abnormality of the thoracic aorta. LUNGS AND PLEURA: There is a large right pleural effusion which is significantly increased from previous exam with near complete atelectasis of the right lung with significantly reduced aeration to the right middle lobe, right upper lobe, and right lower lobe. Diffuse hazy ground-glass opacities are noted within the left lung,  with a few scattered areas of air trapping in the lower lung zone, suggesting small airways disease. No pneumothorax. SOFT TISSUES AND BONES: No acute bone or soft tissue abnormality. ABDOMEN AND PELVIS: LIVER: Posterior the right lobe of the liver cyst is unchanged, measuring 1.4 cm. A focal hemangioma is noted within the anterior right hepatic lobe, unchanged measuring 2.1 cm. GALLBLADDER AND BILE DUCTS: Tiny stone noted within the gallbladder measuring 3 mm. Gallbladder wall thickening measures 5 mm in thickness. Small amount of pericholecystic fluid. No biliary ductal dilatation. SPLEEN: The spleen is within normal limits in size and appearance. PANCREAS: The pancreas is normal in size and contour without a focal lesion or ductal dilatation. ADRENAL GLANDS: Normal size and morphology bilaterally. No nodule, thickening, or hemorrhage. No periadrenal stranding. KIDNEYS, URETERS AND BLADDER: Bilateral pelvocaliectasis. No obstructing stones or mass noted. No perinephric or periureteral stranding. Bladder diverticula noted right dome of bladder diverticula measures 3.9 cm. GI AND BOWEL: Stomach appears normal. There is no pathologic dilatation of the large or small bowel loops. There is  wall thickening involving the right lower quadrant small bowel loops, which have wall thickness measuring up to 7 mm. Wall thickening involving the cecum, ascending, and proximal transverse colon are identified which subjectively may be improved from 04/15/2024. Wall thickening with surrounding soft tissue stranding is noted involving the left colon from the splenic flexure to the mid sigmoid colon. Extensive left-sided colonic diverticulosis without signs of acute diverticulitis. REPRODUCTIVE: Reproductive organs are unremarkable. PERITONEUM AND RETROPERITONEUM: Soft tissue infiltration which involves the root of mesentery and extending into the right lower quadrant. Ileocolic mesentery is again noted but appears improved in the interval. The central mesenteric tumor measures 8.1 x 2.2 cm (image 50/2), on the previous exam this measured 14.5 x 6.5 cm. Tumor within the right lower quadrant ileocolic mesentery encasing the vascular pedicle for the right lower quadrant small bowel loops and right colon has also decreased in the interval. This measures 6.8 x 3.9 cm (image 57/2), previously 10.9 x 9.1 cm. There is a small volume moderate ascites overlying the liver. Compared with the prior imaging the volume of ascites within the abdomen and pelvis is decreased in the interval with resolution of free fluid within the pelvis. There are no signs of bowel perforation or abscess. No free air. LYMPH NODES: Left periaortic lymph node measures 1.2 cm (image 42/2). Previously 2.3 cm. BONES AND SOFT TISSUES: Lumbar degenerative disc disease. No acute abnormality of the visualized bones. No focal soft tissue abnormality. IMPRESSION: 1. No evidence of acute pulmonary embolism. 2. Large right pleural effusion, significantly increased from prior, with near complete right lung atelectasis, likely accounting for shortness of breath; consider malignant effusion (given lymphoma), heart failure, or infection, and consider diagnostic and  therapeutic thoracentesis with follow-up chest imaging after drainage. 3. Diffuse hazy left lung ground-glass opacities with scattered air trapping, which may reflect pulmonary edema and/or small airways disease. 4. Small-to-moderate ascites, decreased from prior, which may contribute to abdominal distention. 5. Interval decrease in mesenteric tumor burden and retroperitoneal/porta hepatis adenopathy, compatible with treatment response; continue oncologic follow-up imaging per lymphoma protocol. 6. Persistent bowel wall thickening involving right lower quadrant small bowel and colon with surrounding stranding, overall subjectively improved on the right, with differential including enterocolitis, or lymphomatous involvement. 7. No signs of bowel obstruction, perforation, or abscess formation. 8. Wall thickening and pericholecystic fluid involving the gallbladder. This is nonspecific in the setting of ascites. Correlate for any clinical signs or  symptoms of cholecystitis. Electronically signed by: Waddell Calk MD 06/02/2024 10:34 AM EST RP Workstation: [id number]   DG Chest 1 View Result Date: 06/02/2024 EXAM: 1 VIEW(S) XRAY OF THE CHEST 06/02/2024 09:08:49 AM COMPARISON: PET CT 05/30/2024. CLINICAL HISTORY: sob FINDINGS: LUNGS AND PLEURA: Large right pleural effusion with atelectatic changes involving the right lung is unchanged from PET/CT dated 10/28/2024. Left lung appears clear. No pneumothorax. HEART AND MEDIASTINUM: Aortic atherosclerosis. Stable cardiomediastinal contours. BONES AND SOFT TISSUES: No acute osseous abnormality. IMPRESSION: 1. Large right pleural effusion with atelectatic changes involving the right lung. Electronically signed by: Waddell Calk MD 06/02/2024 09:42 AM EST RP Workstation: [id number]     .Critical Care  Performed by: Gennaro Duwaine CROME, DO Authorized by: Gennaro Duwaine CROME, DO   Critical care provider statement:    Critical care time (minutes):  40   Critical care time  was exclusive of:  Separately billable procedures and treating other patients and teaching time   Critical care was necessary to treat or prevent imminent or life-threatening deterioration of the following conditions:  Respiratory failure and sepsis   Critical care was time spent personally by me on the following activities:  Development of treatment plan with patient or surrogate, discussions with consultants, evaluation of patient's response to treatment, examination of patient, ordering and review of laboratory studies, ordering and review of radiographic studies, ordering and performing treatments and interventions, pulse oximetry, re-evaluation of patient's condition and review of old charts   Care discussed with: admitting provider      Medications Ordered in the ED  furosemide  (LASIX ) injection 40 mg (has no administration in time range)  cefTRIAXone  (ROCEPHIN ) 2 g in sodium chloride  0.9 % 100 mL IVPB (has no administration in time range)  azithromycin  (ZITHROMAX ) tablet 500 mg (has no administration in time range)  Chlorhexidine  Gluconate Cloth 2 % PADS 6 each (has no administration in time range)  allopurinol  (ZYLOPRIM ) tablet 150 mg (has no administration in time range)  aspirin  EC tablet 81 mg (has no administration in time range)  rosuvastatin  (CRESTOR ) tablet 10 mg (has no administration in time range)  pantoprazole  (PROTONIX ) EC tablet 40 mg (has no administration in time range)  polyethylene glycol (MIRALAX  / GLYCOLAX ) packet 17 g (has no administration in time range)  simethicone  (MYLICON) chewable tablet 80 mg (has no administration in time range)  multivitamin (PROSIGHT) tablet 1 tablet (has no administration in time range)  fluticasone  (FLONASE ) 50 MCG/ACT nasal spray 2 spray (has no administration in time range)  loratadine  (CLARITIN ) tablet 10 mg (has no administration in time range)  enoxaparin  (LOVENOX ) injection 40 mg (has no administration in time range)  acetaminophen   (TYLENOL ) tablet 650 mg (has no administration in time range)    Or  acetaminophen  (TYLENOL ) suppository 650 mg (has no administration in time range)  ondansetron  (ZOFRAN ) tablet 4 mg (has no administration in time range)    Or  ondansetron  (ZOFRAN ) injection 4 mg (has no administration in time range)  ipratropium-albuterol  (DUONEB) 0.5-2.5 (3) MG/3ML nebulizer solution 3 mL (has no administration in time range)  insulin  aspart (novoLOG ) injection 0-9 Units (has no administration in time range)  insulin  aspart (novoLOG ) injection 0-5 Units (has no administration in time range)  furosemide  (LASIX ) injection 40 mg (40 mg Intravenous Given 06/02/24 0904)  iohexol  (OMNIPAQUE ) 350 MG/ML injection 100 mL (100 mLs Intravenous Contrast Given 06/02/24 0931)  cefTRIAXone  (ROCEPHIN ) 2 g in sodium chloride  0.9 % 100 mL IVPB (0 g Intravenous Stopped 06/02/24 1143)  azithromycin  (ZITHROMAX ) tablet 500 mg (500 mg Oral Given 06/02/24 1110)                                    Medical Decision Making Cardiac monitor interpretation: Sinus tachycardia, no ectopy  Patient here for shortness of breath.  Has a history of follicular lymphoma and frequently gets paracentesis.  Abdomen was some amount of ascites but patient found to have new large right-sided pleural effusion and likely pneumonia.  He met sepsis criteria and was treated for his pneumonia.  Lactate was elevated leukocytosis. Patient was not given any IVF due to hypoxia and fluid overload with ascites, leg swelling, and pleural effusion.  Was given a dose of IV Lasix  as well.  He had immediate relief after being placed on 2 L nasal cannula.  Vitals and is hypoxic on arrival 89% on room air and tachypneic.  I discussed patient's case with Dr. Maree, hospitalist the patient will be admitted for further workup and management.  Patient is agreeable with the plan for admission.  All results and plan discussed with patient family at bedside  Problems  Addressed: Acute hypoxic respiratory failure (HCC): acute illness or injury that poses a threat to life or bodily functions History of follicular lymphoma: chronic illness or injury Malignant ascites (HCC): chronic illness or injury with exacerbation, progression, or side effects of treatment Pleural effusion: undiagnosed new problem with uncertain prognosis Pneumonia of right lung due to infectious organism, unspecified part of lung: acute illness or injury Sepsis, due to unspecified organism, unspecified whether acute organ dysfunction present [facility]): acute illness or injury that poses a threat to life or bodily functions  Amount and/or Complexity of Data Reviewed External Data Reviewed: notes.    Details: Prior hospital records reviewed and patient has had frequent admissions for paracentesis Labs: ordered. Decision-making details documented in ED Course.    Details: Ordered and reviewed by me and patient with leukocytosis and elevated lactate, LFTs are slightly above normal but this is his baseline Radiology: ordered and independent interpretation performed. Decision-making details documented in ED Course.    Details: Ordered and interpreted by me independently of radiology Chest x-ray: Shows near complete whiteout/effusion of right lung CT angiogram of the chest: Shows no evidence of PE but does show evidence of large pleural effusion and pneumonia CT abdomen pelvis: Shows known cancer and ascites ECG/medicine tests: ordered and independent interpretation performed. Decision-making details documented in ED Course.    Details: Ordered and interpreted by me in the absence of cardiology and shows sinus tachycardia, no STEMI, or significant change when compared to prior EKG Discussion of management or test interpretation with external provider(s): Dr. Maree - hospitalist - I spoke with him regarding patient's case and he will be admitted for further workup and management   Risk OTC  drugs. Prescription drug management. Drug therapy requiring intensive monitoring for toxicity. Decision regarding hospitalization. Risk Details: CRITICAL CARE Performed by: Duwaine LITTIE Fusi   Total critical care time: 40 minutes  Critical care time was exclusive of separately billable procedures and treating other patients.  Critical care was necessary to treat or prevent imminent or life-threatening deterioration.  Critical care was time spent personally by me on the following activities: development of treatment plan with patient and/or surrogate as well as nursing, discussions with consultants, evaluation of patient's response to treatment, examination of patient, obtaining history from patient or surrogate, ordering and  performing treatments and interventions, ordering and review of laboratory studies, ordering and review of radiographic studies, pulse oximetry and re-evaluation of patient's condition.   Critical Care Total time providing critical care: 40 minutes     Final diagnoses:  Acute hypoxic respiratory failure (HCC)  Pleural effusion  Pneumonia of right lung due to infectious organism, unspecified part of lung  Sepsis, due to unspecified organism, unspecified whether acute organ dysfunction present [facility])  Malignant ascites (HCC)  History of follicular lymphoma    ED Discharge Orders     None          Gennaro Duwaine CROME, DO 06/02/24 1449  "

## 2024-06-02 NOTE — ED Triage Notes (Signed)
 Patient arrives by POV c/o shortness of breath yesterday. Patient states he has been in and out of hospital requiring fluid removed off of his abdomen. Son in law at bedside states patient was recently losing weight however seems to be putting it back on. Patient A&0x4. Holding a fan to face stating it helps his breathing. Initial room air sat 89% and placed on 2L [place].

## 2024-06-03 ENCOUNTER — Observation Stay (HOSPITAL_COMMUNITY)

## 2024-06-03 ENCOUNTER — Encounter (HOSPITAL_COMMUNITY): Payer: Self-pay | Admitting: Internal Medicine

## 2024-06-03 ENCOUNTER — Inpatient Hospital Stay (HOSPITAL_COMMUNITY)

## 2024-06-03 DIAGNOSIS — E7800 Pure hypercholesterolemia, unspecified: Secondary | ICD-10-CM | POA: Diagnosis present

## 2024-06-03 DIAGNOSIS — Z85828 Personal history of other malignant neoplasm of skin: Secondary | ICD-10-CM | POA: Diagnosis not present

## 2024-06-03 DIAGNOSIS — R180 Malignant ascites: Secondary | ICD-10-CM | POA: Diagnosis present

## 2024-06-03 DIAGNOSIS — A419 Sepsis, unspecified organism: Secondary | ICD-10-CM | POA: Diagnosis present

## 2024-06-03 DIAGNOSIS — Z87891 Personal history of nicotine dependence: Secondary | ICD-10-CM | POA: Diagnosis not present

## 2024-06-03 DIAGNOSIS — N1832 Chronic kidney disease, stage 3b: Secondary | ICD-10-CM | POA: Diagnosis present

## 2024-06-03 DIAGNOSIS — Z96653 Presence of artificial knee joint, bilateral: Secondary | ICD-10-CM | POA: Diagnosis present

## 2024-06-03 DIAGNOSIS — Z6826 Body mass index (BMI) 26.0-26.9, adult: Secondary | ICD-10-CM | POA: Diagnosis not present

## 2024-06-03 DIAGNOSIS — J9601 Acute respiratory failure with hypoxia: Secondary | ICD-10-CM | POA: Diagnosis present

## 2024-06-03 DIAGNOSIS — E872 Acidosis, unspecified: Secondary | ICD-10-CM | POA: Diagnosis present

## 2024-06-03 DIAGNOSIS — Z79899 Other long term (current) drug therapy: Secondary | ICD-10-CM | POA: Diagnosis not present

## 2024-06-03 DIAGNOSIS — Z66 Do not resuscitate: Secondary | ICD-10-CM | POA: Diagnosis present

## 2024-06-03 DIAGNOSIS — Z7982 Long term (current) use of aspirin: Secondary | ICD-10-CM | POA: Diagnosis not present

## 2024-06-03 DIAGNOSIS — J90 Pleural effusion, not elsewhere classified: Secondary | ICD-10-CM | POA: Diagnosis present

## 2024-06-03 DIAGNOSIS — I129 Hypertensive chronic kidney disease with stage 1 through stage 4 chronic kidney disease, or unspecified chronic kidney disease: Secondary | ICD-10-CM | POA: Diagnosis present

## 2024-06-03 DIAGNOSIS — Z7984 Long term (current) use of oral hypoglycemic drugs: Secondary | ICD-10-CM | POA: Diagnosis not present

## 2024-06-03 DIAGNOSIS — E8770 Fluid overload, unspecified: Secondary | ICD-10-CM | POA: Diagnosis present

## 2024-06-03 DIAGNOSIS — E1165 Type 2 diabetes mellitus with hyperglycemia: Secondary | ICD-10-CM | POA: Diagnosis present

## 2024-06-03 DIAGNOSIS — E1122 Type 2 diabetes mellitus with diabetic chronic kidney disease: Secondary | ICD-10-CM | POA: Diagnosis present

## 2024-06-03 DIAGNOSIS — J9811 Atelectasis: Secondary | ICD-10-CM | POA: Diagnosis present

## 2024-06-03 DIAGNOSIS — E43 Unspecified severe protein-calorie malnutrition: Secondary | ICD-10-CM | POA: Diagnosis present

## 2024-06-03 DIAGNOSIS — C8290 Follicular lymphoma, unspecified, unspecified site: Secondary | ICD-10-CM | POA: Diagnosis present

## 2024-06-03 DIAGNOSIS — J189 Pneumonia, unspecified organism: Secondary | ICD-10-CM | POA: Diagnosis present

## 2024-06-03 LAB — CBC
HCT: 38.1 % — ABNORMAL LOW (ref 39.0–52.0)
Hemoglobin: 11.6 g/dL — ABNORMAL LOW (ref 13.0–17.0)
MCH: 27.2 pg (ref 26.0–34.0)
MCHC: 30.4 g/dL (ref 30.0–36.0)
MCV: 89.4 fL (ref 80.0–100.0)
Platelets: 318 K/uL (ref 150–400)
RBC: 4.26 MIL/uL (ref 4.22–5.81)
RDW: 18.1 % — ABNORMAL HIGH (ref 11.5–15.5)
WBC: 14.7 K/uL — ABNORMAL HIGH (ref 4.0–10.5)
nRBC: 0 % (ref 0.0–0.2)

## 2024-06-03 LAB — MAGNESIUM: Magnesium: 1.9 mg/dL (ref 1.7–2.4)

## 2024-06-03 LAB — GLUCOSE, CAPILLARY
Glucose-Capillary: 115 mg/dL — ABNORMAL HIGH (ref 70–99)
Glucose-Capillary: 141 mg/dL — ABNORMAL HIGH (ref 70–99)
Glucose-Capillary: 99 mg/dL (ref 70–99)
Glucose-Capillary: 129 mg/dL — ABNORMAL HIGH (ref 70–99)

## 2024-06-03 LAB — COMPREHENSIVE METABOLIC PANEL WITH GFR
ALT: 14 U/L (ref 0–44)
AST: 19 U/L (ref 15–41)
Albumin: 3.2 g/dL — ABNORMAL LOW (ref 3.5–5.0)
Alkaline Phosphatase: 122 U/L (ref 38–126)
Anion gap: 12 (ref 5–15)
BUN: 27 mg/dL — ABNORMAL HIGH (ref 8–23)
CO2: 31 mmol/L (ref 22–32)
Calcium: 8.5 mg/dL — ABNORMAL LOW (ref 8.9–10.3)
Chloride: 100 mmol/L (ref 98–111)
Creatinine, Ser: 1.1 mg/dL (ref 0.61–1.24)
GFR, Estimated: >60 mL/min
Glucose, Bld: 115 mg/dL — ABNORMAL HIGH (ref 70–99)
Potassium: 4.1 mmol/L (ref 3.5–5.1)
Sodium: 142 mmol/L (ref 135–145)
Total Bilirubin: 0.5 mg/dL (ref 0.0–1.2)
Total Protein: 5.4 g/dL — ABNORMAL LOW (ref 6.5–8.1)

## 2024-06-03 LAB — BODY FLUID CELL COUNT WITH DIFFERENTIAL
Eos, Fluid: 0 %
Lymphs, Fluid: 67 %
Monocyte-Macrophage-Serous Fluid: 5 % — ABNORMAL LOW (ref 50–90)
Neutrophil Count, Fluid: 28 % — ABNORMAL HIGH (ref 0–25)
Total Nucleated Cell Count, Fluid: 8064 uL — ABNORMAL HIGH (ref 0–1000)

## 2024-06-03 LAB — GRAM STAIN: Gram Stain: NONE SEEN

## 2024-06-03 LAB — LACTIC ACID, PLASMA: Lactic Acid, Venous: 2.4 mmol/L (ref 0.5–1.9)

## 2024-06-03 MED ORDER — LIDOCAINE HCL (PF) 2 % IJ SOLN
INTRAMUSCULAR | Status: AC
  Filled 2024-06-03: qty 10

## 2024-06-03 MED ORDER — LIDOCAINE HCL (PF) 2 % IJ SOLN
10.0000 mL | Freq: Once | INTRAMUSCULAR | Status: AC
  Administered 2024-06-03: 10 mL via INTRADERMAL

## 2024-06-03 NOTE — Progress Notes (Signed)
 " PROGRESS NOTE    Cody Palmer  FMW:[id number] DOB: 13-Jun-1932 DOA: 06/02/2024 PCP: [name], NP   Brief Narrative:    Cody Palmer is a 89 y.o. male with medical history significant for CKD stage IIIa, hypertension, dyslipidemia, type 2 diabetes, and non-Hodgkin's lymphoma who presented to the ED with worsening shortness of breath over the last few days.  He would routinely get paracentesis done, but has not required 1 in the last 3 weeks.  Patient was admitted with acute hypoxemic respiratory failure secondary to large pleural effusion along with atelectasis as well as sepsis, POA secondary to pneumonia.  Assessment & Plan:   Principal Problem:   Acute hypoxemic respiratory failure (HCC) Active Problems:   Essential hypertension   Abdominal distention   DNR (do not resuscitate)   Follicular lymphoma (HCC)   CKD stage 3 due to type 2 diabetes mellitus (HCC)  Assessment and Plan:   Acute hypoxemic respiratory failure secondary to large right pleural effusion with atelectasis - Plan for ultrasound thoracentesis today - Monitor in stepdown unit until heart rates further stabilized after thoracentesis - Continue IV Lasix  for diuresis 40 mg twice daily - Continue nasal cannula support   Sepsis, POA secondary to pneumonia - Continue IV antibiotics with azithromycin  and Rocephin  - Monitor CBC and lactic acid - Further culture and Gram stain with thoracentesis in a.m.   Non-Hodgkin lymphoma - Follows with Dr. Davonna and has recently had PET scan and results are pending   Recurring ascites likely secondary to lymphoma - Small to moderate on abdominal imaging with no tense abdomen or signs of SBP - Hold on paracentesis for now   CKD stage IIIb - Current creatinine near baseline   Hypertension - Currently stable, monitor with IV diuresis and hold other antihypertensives   Type 2 diabetes-with mild hyperglycemia - Carb modified diet and SSI   DVT  prophylaxis:Lovenox  Code Status: DNR Family Communication: Daughter and son-in-law at bedside 1/19 Disposition Plan:  Status is: Observation The patient will require care spanning > 2 midnights and should be moved to inpatient because: Need for IV medications and thoracentesis.   Consultants:  None  Procedures:  None  Antimicrobials:  Anti-infectives (From admission, onward)    Start     Dose/Rate Route Frequency Ordered Stop   06/03/24 1000  cefTRIAXone  (ROCEPHIN ) 2 g in sodium chloride  0.9 % 100 mL IVPB        2 g 200 mL/hr over 30 Minutes Intravenous Every 24 hours 06/02/24 1145     06/03/24 1000  azithromycin  (ZITHROMAX ) tablet 500 mg        500 mg Oral Daily 06/02/24 1145     06/02/24 1100  cefTRIAXone  (ROCEPHIN ) 2 g in sodium chloride  0.9 % 100 mL IVPB        2 g 200 mL/hr over 30 Minutes Intravenous  Once 06/02/24 1058 06/02/24 1143   06/02/24 1100  azithromycin  (ZITHROMAX ) tablet 500 mg        500 mg Oral  Once 06/02/24 1058 06/02/24 1110       Subjective: Patient seen and evaluated today with no new acute complaints or concerns. No acute concerns or events noted overnight.  He denies any specific complaints or concerns and remains tachycardic this morning.  Objective: Vitals:   06/03/24 0400 06/03/24 0500 06/03/24 0720 06/03/24 0740  BP: 120/64 123/74 (!) 157/71   Pulse: (!) 105 (!) 110 62   Resp: 16 14 (!) 21   Temp:  97.6 F (36.4 C)   97.8 F (36.6 C)  TempSrc: Axillary   Oral  SpO2: 97% 96% 98%   Weight:  78.3 kg    Height:        Intake/Output Summary (Last 24 hours) at 06/03/2024 1003 Last data filed at 06/03/2024 0000 Gross per 24 hour  Intake 400 ml  Output 740 ml  Net -340 ml   Filed Weights   06/02/24 0821 06/03/24 0500  Weight: 81.2 kg 78.3 kg    Examination:  General exam: Appears calm and comfortable  Respiratory system: Clear to auscultation. Respiratory effort normal.  Nasal cannula oxygen. Cardiovascular system: S1 & S2 heard,  RRR.  Tachycardic Gastrointestinal system: Abdomen is soft Central nervous system: Alert and awake Extremities: No edema Skin: No significant lesions noted Psychiatry: Flat affect.    Data Reviewed: I have personally reviewed following labs and imaging studies  CBC: Recent Labs  Lab 05/28/24 0803 06/02/24 0842 06/03/24 0422  WBC 9.9 19.2* 14.7*  NEUTROABS 6.5 14.1*  --   HGB 10.1* 12.4* 11.6*  HCT 33.0* 40.6 38.1*  MCV 88.7 89.0 89.4  PLT 239 345 318   Basic Metabolic Panel: Recent Labs  Lab 05/28/24 0803 06/02/24 0842 06/03/24 0422  NA 143 143 142  K 3.3* 3.7 4.1  CL 103 101 100  CO2 27 25 31   GLUCOSE 137* 154* 115*  BUN 16 26* 27*  CREATININE 1.09 0.99 1.10  CALCIUM  8.5* 8.6* 8.5*  MG 1.7  --  1.9   GFR: Estimated Creatinine Clearance: 45.5 mL/min (by C-G formula based on SCr of 1.1 mg/dL). Liver Function Tests: Recent Labs  Lab 05/28/24 0803 06/02/24 0842 06/03/24 0422  AST 23 21 19   ALT 14 14 14   ALKPHOS 178* 139* 122  BILITOT 0.7 0.8 0.5  PROT 5.5* 6.1* 5.4*  ALBUMIN  3.4* 3.5 3.2*   No results for input(s): LIPASE, AMYLASE in the last 168 hours. No results for input(s): AMMONIA in the last 168 hours. Coagulation Profile: Recent Labs  Lab 06/02/24 0842  INR 1.1   Cardiac Enzymes: No results for input(s): CKTOTAL, CKMB, CKMBINDEX, TROPONINI in the last 168 hours. BNP (last 3 results) Recent Labs    06/02/24 0911  PROBNP 933.0*   HbA1C: No results for input(s): HGBA1C in the last 72 hours. CBG: Recent Labs  Lab 06/02/24 1616 06/02/24 2118 06/03/24 0727  GLUCAP 117* 120* 115*   Lipid Profile: No results for input(s): CHOL, HDL, LDLCALC, TRIG, CHOLHDL, LDLDIRECT in the last 72 hours. Thyroid Function Tests: No results for input(s): TSH, T4TOTAL, FREET4, T3FREE, THYROIDAB in the last 72 hours. Anemia Panel: No results for input(s): VITAMINB12, FOLATE, FERRITIN, TIBC, IRON, RETICCTPCT  in the last 72 hours. Sepsis Labs: Recent Labs  Lab 06/02/24 9157 06/02/24 1010 06/03/24 0422  LATICACIDVEN 3.9* 2.5* 2.4*    Recent Results (from the past 240 hours)  Culture, blood (routine x 2)     Status: None (Preliminary result)   Collection Time: 06/02/24  8:51 AM   Specimen: BLOOD  Result Value Ref Range Status   Specimen Description BLOOD BLOOD LEFT FOREARM  Final   Special Requests   Final    BOTTLES DRAWN AEROBIC AND ANAEROBIC Blood Culture adequate volume Performed at [facility], [address] [place], [place] [address]    Culture PENDING  Incomplete   Report Status PENDING  Incomplete  Resp panel by RT-PCR (RSV, Flu A&B, Covid) Anterior Nasal Swab     Status: None  Collection Time: 06/02/24  9:06 AM   Specimen: Anterior Nasal Swab  Result Value Ref Range Status   SARS Coronavirus 2 by RT PCR NEGATIVE NEGATIVE Final    Comment: (NOTE) SARS-CoV-2 target nucleic acids are NOT DETECTED.  The SARS-CoV-2 RNA is generally detectable in upper respiratory specimens during the acute phase of infection. The lowest concentration of SARS-CoV-2 viral copies this assay can detect is 138 copies/mL. A negative result does not preclude SARS-Cov-2 infection and should not be used as the sole basis for treatment or other patient management decisions. A negative result may occur with  improper specimen collection/handling, submission of specimen other than nasopharyngeal swab, presence of viral mutation(s) within the areas targeted by this assay, and inadequate number of viral copies(<138 copies/mL). A negative result must be combined with clinical observations, patient history, and epidemiological information. The expected result is Negative.  Fact Sheet for Patients:  bloggercourse.com  Fact Sheet for Healthcare Providers:  seriousbroker.it  This test is no t yet approved or cleared by the [place]  FDA and  has been  authorized for detection and/or diagnosis of SARS-CoV-2 by FDA under an Emergency Use Authorization (EUA). This EUA will remain  in effect (meaning this test can be used) for the duration of the COVID-19 declaration under Section 564(b)(1) of the Act, 21 U.S.C.section 360bbb-3(b)(1), unless the authorization is terminated  or revoked sooner.       Influenza A by PCR NEGATIVE NEGATIVE Final   Influenza B by PCR NEGATIVE NEGATIVE Final    Comment: (NOTE) The Xpert Xpress SARS-CoV-2/FLU/RSV plus assay is intended as an aid in the diagnosis of influenza from Nasopharyngeal swab specimens and should not be used as a sole basis for treatment. Nasal washings and aspirates are unacceptable for Xpert Xpress SARS-CoV-2/FLU/RSV testing.  Fact Sheet for Patients: bloggercourse.com  Fact Sheet for Healthcare Providers: seriousbroker.it  This test is not yet approved or cleared by the [place]  FDA and has been authorized for detection and/or diagnosis of SARS-CoV-2 by FDA under an Emergency Use Authorization (EUA). This EUA will remain in effect (meaning this test can be used) for the duration of the COVID-19 declaration under Section 564(b)(1) of the Act, 21 U.S.C. section 360bbb-3(b)(1), unless the authorization is terminated or revoked.     Resp Syncytial Virus by PCR NEGATIVE NEGATIVE Final    Comment: (NOTE) Fact Sheet for Patients: bloggercourse.com  Fact Sheet for Healthcare Providers: seriousbroker.it  This test is not yet approved or cleared by the [place]  FDA and has been authorized for detection and/or diagnosis of SARS-CoV-2 by FDA under an Emergency Use Authorization (EUA). This EUA will remain in effect (meaning this test can be used) for the duration of the COVID-19 declaration under Section 564(b)(1) of the Act, 21 U.S.C. section 360bbb-3(b)(1), unless the  authorization is terminated or revoked.  Performed at [facility], [address], [place], [place] [address]   Culture, blood (routine x 2)     Status: None (Preliminary result)   Collection Time: 06/02/24  9:11 AM   Specimen: BLOOD  Result Value Ref Range Status   Specimen Description BLOOD LEFT ANTECUBITAL  Final   Special Requests   Final    BOTTLES DRAWN AEROBIC AND ANAEROBIC Blood Culture adequate volume Performed at [facility], [address] [place], [place] [address]    Culture PENDING  Incomplete   Report Status PENDING  Incomplete  Urine Culture     Status: None (Preliminary result)   Collection Time: 06/02/24  11:27 AM   Specimen: Urine, Random  Result Value Ref Range Status   Specimen Description   Final    URINE, RANDOM Performed at [facility], [address], [place], [place] [address]    Special Requests   Final    URINE, CLEAN CATCH Performed at [facility], [address], [place], [place] [address]    Culture PENDING  Incomplete   Report Status PENDING  Incomplete  MRSA Next Gen by PCR, Nasal     Status: Abnormal   Collection Time: 06/02/24 12:12 PM   Specimen: Nasal Mucosa; Nasal Swab  Result Value Ref Range Status   MRSA by PCR Next Gen DETECTED (A) NOT DETECTED Final    Comment: RESULT CALLED TO, READ BACK BY AND VERIFIED WITH: A RAY AT 1724 ON [id number] BY S DALTON (NOTE) The GeneXpert MRSA Assay (FDA approved for NASAL specimens only), is one component of a comprehensive MRSA colonization surveillance program. It is not intended to diagnose MRSA infection nor to guide or monitor treatment for MRSA infections. Test performance is not FDA approved in patients less than 65 years old. Performed at [facility], [address] [place], [place] [address]          Radiology Studies: CT Angio Chest Pulmonary Embolism (PE) W or WO Contrast Result Date: 06/02/2024 EXAM: CTA CHEST PE WITHOUT AND WITH CONTRAST CT ABDOMEN AND PELVIS WITHOUT AND  WITH CONTRAST 06/02/2024 09:43:31 AM TECHNIQUE: CTA of the chest was performed after the administration of intravenous contrast. Multiplanar reformatted images are provided for review. MIP images are provided for review. CT of the abdomen and pelvis was performed without and with the administration of intravenous contrast. Automated exposure control, iterative reconstruction, and/or weight based adjustment of the mA/kV was utilized to reduce the radiation dose to as low as reasonably achievable. COMPARISON: PET/CT 05/02/2024. CLINICAL HISTORY: Lymphoma. Patient presents with shortness of breath and abdominal distention. FINDINGS: CHEST: PULMONARY ARTERIES: Pulmonary arteries are adequately opacified for evaluation. No intraluminal filling defect to suggest pulmonary embolism. Main pulmonary artery is normal in caliber. MEDIASTINUM: No mediastinal lymphadenopathy. No axillary or supraclavicular adenopathy. Previous right costophrenic (CP) angle lymph node measures 1.5 cm (image 101/5), previously 1.6 cm. The heart and pericardium demonstrate no acute abnormality. Aortic atherosclerosis and coronary artery calcification. There is no acute abnormality of the thoracic aorta. LUNGS AND PLEURA: There is a large right pleural effusion which is significantly increased from previous exam with near complete atelectasis of the right lung with significantly reduced aeration to the right middle lobe, right upper lobe, and right lower lobe. Diffuse hazy ground-glass opacities are noted within the left lung, with a few scattered areas of air trapping in the lower lung zone, suggesting small airways disease. No pneumothorax. SOFT TISSUES AND BONES: No acute bone or soft tissue abnormality. ABDOMEN AND PELVIS: LIVER: Posterior the right lobe of the liver cyst is unchanged, measuring 1.4 cm. A focal hemangioma is noted within the anterior right hepatic lobe, unchanged measuring 2.1 cm. GALLBLADDER AND BILE DUCTS: Tiny stone noted  within the gallbladder measuring 3 mm. Gallbladder wall thickening measures 5 mm in thickness. Small amount of pericholecystic fluid. No biliary ductal dilatation. SPLEEN: The spleen is within normal limits in size and appearance. PANCREAS: The pancreas is normal in size and contour without a focal lesion or ductal dilatation. ADRENAL GLANDS: Normal size and morphology bilaterally. No nodule, thickening, or hemorrhage. No periadrenal stranding. KIDNEYS, URETERS AND BLADDER: Bilateral pelvocaliectasis. No obstructing stones  or mass noted. No perinephric or periureteral stranding. Bladder diverticula noted right dome of bladder diverticula measures 3.9 cm. GI AND BOWEL: Stomach appears normal. There is no pathologic dilatation of the large or small bowel loops. There is wall thickening involving the right lower quadrant small bowel loops, which have wall thickness measuring up to 7 mm. Wall thickening involving the cecum, ascending, and proximal transverse colon are identified which subjectively may be improved from 04/15/2024. Wall thickening with surrounding soft tissue stranding is noted involving the left colon from the splenic flexure to the mid sigmoid colon. Extensive left-sided colonic diverticulosis without signs of acute diverticulitis. REPRODUCTIVE: Reproductive organs are unremarkable. PERITONEUM AND RETROPERITONEUM: Soft tissue infiltration which involves the root of mesentery and extending into the right lower quadrant. Ileocolic mesentery is again noted but appears improved in the interval. The central mesenteric tumor measures 8.1 x 2.2 cm (image 50/2), on the previous exam this measured 14.5 x 6.5 cm. Tumor within the right lower quadrant ileocolic mesentery encasing the vascular pedicle for the right lower quadrant small bowel loops and right colon has also decreased in the interval. This measures 6.8 x 3.9 cm (image 57/2), previously 10.9 x 9.1 cm. There is a small volume moderate ascites overlying  the liver. Compared with the prior imaging the volume of ascites within the abdomen and pelvis is decreased in the interval with resolution of free fluid within the pelvis. There are no signs of bowel perforation or abscess. No free air. LYMPH NODES: Left periaortic lymph node measures 1.2 cm (image 42/2). Previously 2.3 cm. BONES AND SOFT TISSUES: Lumbar degenerative disc disease. No acute abnormality of the visualized bones. No focal soft tissue abnormality. IMPRESSION: 1. No evidence of acute pulmonary embolism. 2. Large right pleural effusion, significantly increased from prior, with near complete right lung atelectasis, likely accounting for shortness of breath; consider malignant effusion (given lymphoma), heart failure, or infection, and consider diagnostic and therapeutic thoracentesis with follow-up chest imaging after drainage. 3. Diffuse hazy left lung ground-glass opacities with scattered air trapping, which may reflect pulmonary edema and/or small airways disease. 4. Small-to-moderate ascites, decreased from prior, which may contribute to abdominal distention. 5. Interval decrease in mesenteric tumor burden and retroperitoneal/porta hepatis adenopathy, compatible with treatment response; continue oncologic follow-up imaging per lymphoma protocol. 6. Persistent bowel wall thickening involving right lower quadrant small bowel and colon with surrounding stranding, overall subjectively improved on the right, with differential including enterocolitis, or lymphomatous involvement. 7. No signs of bowel obstruction, perforation, or abscess formation. 8. Wall thickening and pericholecystic fluid involving the gallbladder. This is nonspecific in the setting of ascites. Correlate for any clinical signs or symptoms of cholecystitis. Electronically signed by: Waddell Calk MD 06/02/2024 10:34 AM EST RP Workstation: [id number]   CT ABDOMEN PELVIS W CONTRAST Result Date: 06/02/2024 EXAM: CTA CHEST PE WITHOUT AND  WITH CONTRAST CT ABDOMEN AND PELVIS WITHOUT AND WITH CONTRAST 06/02/2024 09:43:31 AM TECHNIQUE: CTA of the chest was performed after the administration of intravenous contrast. Multiplanar reformatted images are provided for review. MIP images are provided for review. CT of the abdomen and pelvis was performed without and with the administration of intravenous contrast. Automated exposure control, iterative reconstruction, and/or weight based adjustment of the mA/kV was utilized to reduce the radiation dose to as low as reasonably achievable. COMPARISON: PET/CT 05/02/2024. CLINICAL HISTORY: Lymphoma. Patient presents with shortness of breath and abdominal distention. FINDINGS: CHEST: PULMONARY ARTERIES: Pulmonary arteries are adequately opacified for evaluation. No intraluminal filling defect to  suggest pulmonary embolism. Main pulmonary artery is normal in caliber. MEDIASTINUM: No mediastinal lymphadenopathy. No axillary or supraclavicular adenopathy. Previous right costophrenic (CP) angle lymph node measures 1.5 cm (image 101/5), previously 1.6 cm. The heart and pericardium demonstrate no acute abnormality. Aortic atherosclerosis and coronary artery calcification. There is no acute abnormality of the thoracic aorta. LUNGS AND PLEURA: There is a large right pleural effusion which is significantly increased from previous exam with near complete atelectasis of the right lung with significantly reduced aeration to the right middle lobe, right upper lobe, and right lower lobe. Diffuse hazy ground-glass opacities are noted within the left lung, with a few scattered areas of air trapping in the lower lung zone, suggesting small airways disease. No pneumothorax. SOFT TISSUES AND BONES: No acute bone or soft tissue abnormality. ABDOMEN AND PELVIS: LIVER: Posterior the right lobe of the liver cyst is unchanged, measuring 1.4 cm. A focal hemangioma is noted within the anterior right hepatic lobe, unchanged measuring 2.1 cm.  GALLBLADDER AND BILE DUCTS: Tiny stone noted within the gallbladder measuring 3 mm. Gallbladder wall thickening measures 5 mm in thickness. Small amount of pericholecystic fluid. No biliary ductal dilatation. SPLEEN: The spleen is within normal limits in size and appearance. PANCREAS: The pancreas is normal in size and contour without a focal lesion or ductal dilatation. ADRENAL GLANDS: Normal size and morphology bilaterally. No nodule, thickening, or hemorrhage. No periadrenal stranding. KIDNEYS, URETERS AND BLADDER: Bilateral pelvocaliectasis. No obstructing stones or mass noted. No perinephric or periureteral stranding. Bladder diverticula noted right dome of bladder diverticula measures 3.9 cm. GI AND BOWEL: Stomach appears normal. There is no pathologic dilatation of the large or small bowel loops. There is wall thickening involving the right lower quadrant small bowel loops, which have wall thickness measuring up to 7 mm. Wall thickening involving the cecum, ascending, and proximal transverse colon are identified which subjectively may be improved from 04/15/2024. Wall thickening with surrounding soft tissue stranding is noted involving the left colon from the splenic flexure to the mid sigmoid colon. Extensive left-sided colonic diverticulosis without signs of acute diverticulitis. REPRODUCTIVE: Reproductive organs are unremarkable. PERITONEUM AND RETROPERITONEUM: Soft tissue infiltration which involves the root of mesentery and extending into the right lower quadrant. Ileocolic mesentery is again noted but appears improved in the interval. The central mesenteric tumor measures 8.1 x 2.2 cm (image 50/2), on the previous exam this measured 14.5 x 6.5 cm. Tumor within the right lower quadrant ileocolic mesentery encasing the vascular pedicle for the right lower quadrant small bowel loops and right colon has also decreased in the interval. This measures 6.8 x 3.9 cm (image 57/2), previously 10.9 x 9.1 cm. There  is a small volume moderate ascites overlying the liver. Compared with the prior imaging the volume of ascites within the abdomen and pelvis is decreased in the interval with resolution of free fluid within the pelvis. There are no signs of bowel perforation or abscess. No free air. LYMPH NODES: Left periaortic lymph node measures 1.2 cm (image 42/2). Previously 2.3 cm. BONES AND SOFT TISSUES: Lumbar degenerative disc disease. No acute abnormality of the visualized bones. No focal soft tissue abnormality. IMPRESSION: 1. No evidence of acute pulmonary embolism. 2. Large right pleural effusion, significantly increased from prior, with near complete right lung atelectasis, likely accounting for shortness of breath; consider malignant effusion (given lymphoma), heart failure, or infection, and consider diagnostic and therapeutic thoracentesis with follow-up chest imaging after drainage. 3. Diffuse hazy left lung ground-glass opacities with  scattered air trapping, which may reflect pulmonary edema and/or small airways disease. 4. Small-to-moderate ascites, decreased from prior, which may contribute to abdominal distention. 5. Interval decrease in mesenteric tumor burden and retroperitoneal/porta hepatis adenopathy, compatible with treatment response; continue oncologic follow-up imaging per lymphoma protocol. 6. Persistent bowel wall thickening involving right lower quadrant small bowel and colon with surrounding stranding, overall subjectively improved on the right, with differential including enterocolitis, or lymphomatous involvement. 7. No signs of bowel obstruction, perforation, or abscess formation. 8. Wall thickening and pericholecystic fluid involving the gallbladder. This is nonspecific in the setting of ascites. Correlate for any clinical signs or symptoms of cholecystitis. Electronically signed by: Waddell Calk MD 06/02/2024 10:34 AM EST RP Workstation: [id number]   DG Chest 1 View Result Date:  06/02/2024 EXAM: 1 VIEW(S) XRAY OF THE CHEST 06/02/2024 09:08:49 AM COMPARISON: PET CT 05/30/2024. CLINICAL HISTORY: sob FINDINGS: LUNGS AND PLEURA: Large right pleural effusion with atelectatic changes involving the right lung is unchanged from PET/CT dated 10/28/2024. Left lung appears clear. No pneumothorax. HEART AND MEDIASTINUM: Aortic atherosclerosis. Stable cardiomediastinal contours. BONES AND SOFT TISSUES: No acute osseous abnormality. IMPRESSION: 1. Large right pleural effusion with atelectatic changes involving the right lung. Electronically signed by: Waddell Calk MD 06/02/2024 09:42 AM EST RP Workstation: [id number]        Scheduled Meds:  allopurinol   150 mg Oral Daily   aspirin  EC  81 mg Oral Daily   azithromycin   500 mg Oral Daily   Chlorhexidine  Gluconate Cloth  6 each Topical Q0600   enoxaparin  (LOVENOX ) injection  40 mg Subcutaneous Q24H   fluticasone   2 spray Each Nare Daily   furosemide   40 mg Intravenous BID   insulin  aspart  0-5 Units Subcutaneous QHS   insulin  aspart  0-9 Units Subcutaneous TID WC   loratadine   10 mg Oral Daily   multivitamin  1 tablet Oral BID   mupirocin  ointment  1 Application Nasal BID   pantoprazole   40 mg Oral Daily   rosuvastatin   10 mg Oral QODAY   Continuous Infusions:  cefTRIAXone  (ROCEPHIN )  IV 2 g (06/03/24 0948)     LOS: 0 days    Time spent: 55 minutes    Lasandra Batley JONETTA Fairly, DO [facility]  If 7PM-7AM, please contact night-coverage [email or web address] 06/03/2024, 10:03 AM   "

## 2024-06-03 NOTE — Care Management Obs Status (Signed)
 MEDICARE OBSERVATION STATUS NOTIFICATION   Patient Details  Name: Cody Palmer MRN: [id number] Date of Birth: 09/03/1932   Medicare Observation Status Notification Given:  Yes    Ronnald MARLA Sil, RN 06/03/2024, 9:59 AM

## 2024-06-03 NOTE — Progress Notes (Signed)
 Pt had right ultrasound guided thoracentesis. VSS. Tolerateed well. 1.5 L of blood pleural fluid removed. Pt laying in bed and rest at this time

## 2024-06-03 NOTE — Progress Notes (Signed)
 Patient with 3 recent admissions on 04/04/24, 04/10/24, and 05/02/24 transitioning to [facility] SNF on 12/29, was eventually discharged home 05/25/24.  Patient presented back to hospital on 1/18 with worsening SOB and placed in OBSERVATION for Acute Hypoxic Resp Failure with significant Pleural Effusion necessitating thoracentesis.  Inpatient Care Manager (ICM) conducted chart review and visited with patient at bedside, and contacted Dtr - Kim by phone per patient's request to complete brief admission assessment.    Kim confirmed the patient lives alone at baseline, but her Sister GLENWOOD Mar and BIL came to stay with patient when he returned home from SNF.  Kim endorsed patient has been fairly independent with ADL's, bathing and dressing independently, using his cane or RW interchangeably on occasion.  Mar has been cooking and assisting with household chores.  Patient typically drives himself, but hasn't driven since getting sick.    Patient currently open with [facility] for PT / OT visits, [name] prefers same agency again if indicated at discharge, CM forwarded referral to [facility] thru HUB and notified liaison - Artavia via Text of patient's readmission.  Kim open to considering SNF for rehab again, concerned her Sister - Mar will not be able to remain in town much longer and she works Full Time, unable to stay with patient once discharged.  Patient with exisitng PASRR# [id number].  PT eval pending.  IPCM team will continue to follow along and monitor patient advancement through interdisciplinary progression rounds.  Plan to reassess patient closer to discharge to address transition of care needs, however, please consider entering a ICM consult if any additional needs are identified.    06/03/24 1105  TOC Brief Assessment  Insurance and Status Reviewed  Patient has primary care physician Yes  Home environment has been reviewed Home with Self  Prior level of function: Independent with intermittent use of  Cane or RW  Prior/Current Home Services Current home services ([facility])  Social Drivers of Health Review SDOH reviewed no interventions necessary  Readmission risk has been reviewed Yes  Transition of care needs transition of care needs identified, TOC will continue to follow

## 2024-06-03 NOTE — Plan of Care (Signed)
  Problem: Education: Goal: Knowledge of General Education information will improve Description: Including pain rating scale, medication(s)/side effects and non-pharmacologic comfort measures Outcome: Progressing   Problem: Clinical Measurements: Goal: Respiratory complications will improve Outcome: Progressing   Problem: Coping: Goal: Level of anxiety will decrease Outcome: Progressing   Problem: Pain Managment: Goal: General experience of comfort will improve and/or be controlled Outcome: Progressing

## 2024-06-03 NOTE — Procedures (Signed)
 PROCEDURE SUMMARY:  Successful image-guided right thoracentesis. Yielded 1.5 liters of bloody fluid. Patient tolerated procedure well. EBL < 1 mL. No immediate complications.  Specimen was sent for labs. Post procedure CXR shows no pneumothorax.  Please see imaging section of Epic for full dictation.  Clotilda DELENA Hesselbach PA-C 06/03/2024 2:23 PM

## 2024-06-03 NOTE — Plan of Care (Signed)

## 2024-06-04 ENCOUNTER — Inpatient Hospital Stay

## 2024-06-04 ENCOUNTER — Inpatient Hospital Stay: Admitting: Oncology

## 2024-06-04 DIAGNOSIS — J9601 Acute respiratory failure with hypoxia: Secondary | ICD-10-CM | POA: Diagnosis not present

## 2024-06-04 LAB — PATHOLOGIST SMEAR REVIEW

## 2024-06-04 LAB — CBC
HCT: 36.7 % — ABNORMAL LOW (ref 39.0–52.0)
Hemoglobin: 11.2 g/dL — ABNORMAL LOW (ref 13.0–17.0)
MCH: 27 pg (ref 26.0–34.0)
MCHC: 30.5 g/dL (ref 30.0–36.0)
MCV: 88.4 fL (ref 80.0–100.0)
Platelets: 284 K/uL (ref 150–400)
RBC: 4.15 MIL/uL — ABNORMAL LOW (ref 4.22–5.81)
RDW: 17.8 % — ABNORMAL HIGH (ref 11.5–15.5)
WBC: 13.3 K/uL — ABNORMAL HIGH (ref 4.0–10.5)
nRBC: 0 % (ref 0.0–0.2)

## 2024-06-04 LAB — GLUCOSE, BODY FLUID OTHER: Glucose, Body Fluid Other: 57 mg/dL

## 2024-06-04 LAB — URINE CULTURE: Culture: 10000 — AB

## 2024-06-04 LAB — PROTEIN, BODY FLUID (OTHER): Total Protein, Body Fluid Other: 3.2 g/dL

## 2024-06-04 LAB — COMPREHENSIVE METABOLIC PANEL WITH GFR
ALT: 10 U/L (ref 0–44)
AST: 21 U/L (ref 15–41)
Albumin: 2.9 g/dL — ABNORMAL LOW (ref 3.5–5.0)
Alkaline Phosphatase: 114 U/L (ref 38–126)
Anion gap: 12 (ref 5–15)
BUN: 25 mg/dL — ABNORMAL HIGH (ref 8–23)
CO2: 29 mmol/L (ref 22–32)
Calcium: 8.3 mg/dL — ABNORMAL LOW (ref 8.9–10.3)
Chloride: 100 mmol/L (ref 98–111)
Creatinine, Ser: 0.95 mg/dL (ref 0.61–1.24)
GFR, Estimated: >60 mL/min
Glucose, Bld: 113 mg/dL — ABNORMAL HIGH (ref 70–99)
Potassium: 3.7 mmol/L (ref 3.5–5.1)
Sodium: 141 mmol/L (ref 135–145)
Total Bilirubin: 0.5 mg/dL (ref 0.0–1.2)
Total Protein: 5.1 g/dL — ABNORMAL LOW (ref 6.5–8.1)

## 2024-06-04 LAB — GLUCOSE, CAPILLARY
Glucose-Capillary: 129 mg/dL — ABNORMAL HIGH (ref 70–99)
Glucose-Capillary: 153 mg/dL — ABNORMAL HIGH (ref 70–99)
Glucose-Capillary: 159 mg/dL — ABNORMAL HIGH (ref 70–99)
Glucose-Capillary: 115 mg/dL — ABNORMAL HIGH (ref 70–99)

## 2024-06-04 LAB — MAGNESIUM: Magnesium: 1.9 mg/dL (ref 1.7–2.4)

## 2024-06-04 MED ORDER — ENSURE PLUS HIGH PROTEIN PO LIQD
237.0000 mL | Freq: Two times a day (BID) | ORAL | Status: DC
  Administered 2024-06-05 – 2024-06-07 (×6): 237 mL via ORAL

## 2024-06-04 NOTE — TOC Progression Note (Signed)
 Transition of Care [facility]) - Progression Note    Patient Details  Name: Cody Palmer MRN: [id number] Date of Birth: [birth year]  Transition of Care [facility]) CM/SW Contact  Hoy DELENA Bigness, LCSW Phone Number: 06/04/2024, 2:43 PM  Clinical Narrative:    CSW spoke with pt and pt's daughter, [name] regarding plan for SNF vs HH. [name], [name], shares that pt's other daughter/SIL are currently in town and have been staying with pt to assist him however, they are planning on returning to [place] this weekend. Pt/daughter do not feel that pt would be safe to return home alone. They are agreeable to have referral sent to [facility] for review as pt has been to SNF there in the past. Referral has been made. CSW will continue to follow.    Expected Discharge Plan: Skilled Nursing Facility Barriers to Discharge: Continued Medical Work up               Expected Discharge Plan and Services In-house Referral: Clinical Social Work Discharge Planning Services: NA Post Acute Care Choice: Skilled Nursing Facility, Home Health Living arrangements for the past 2 months: Single Family Home                                       Social Drivers of Health (SDOH) Interventions SDOH Screenings   Food Insecurity: No Food Insecurity (06/02/2024)  Housing: Low Risk (06/02/2024)  Transportation Needs: No Transportation Needs (06/02/2024)  Utilities: Not At Risk (06/02/2024)  Depression (PHQ2-9): Low Risk (05/28/2024)  Social Connections: Unknown (06/02/2024)  Recent Concern: Social Connections - Moderately Isolated (04/10/2024)  Tobacco Use: Medium Risk (06/03/2024)    Readmission Risk Interventions    05/13/2024   10:44 AM 04/19/2024    1:46 PM  Readmission Risk Prevention Plan  Transportation Screening Complete Complete  PCP or Specialist Appt within 3-5 Days  Not Complete  HRI or Home Care Consult Complete Complete  Social Work Consult for Recovery Care Planning/Counseling Complete Complete   Palliative Care Screening Not Applicable Not Applicable  Medication Review Oceanographer) Complete Complete

## 2024-06-04 NOTE — NC FL2 (Signed)
 " [place]  MEDICAID FL2 LEVEL OF CARE FORM     IDENTIFICATION  Patient Name: Cody Palmer Birthdate: 17-Jan-1933 Sex: male Admission Date (Current Location): 06/02/2024  [facility] and [place] Number:  [employer] and Address:  [facility],  [address] [place] [address]      Provider Number: [id number]  Attending Physician Name and Address:  Maree Adron JONETTA, DO  Relative Name and Phone Number:  Gwendloyn Cramp  Daughter, Emergency Contact  [phone]    Current Level of Care: Hospital Recommended Level of Care: Skilled Nursing Facility Prior Approval Number:    Date Approved/Denied:   PASRR Number: [id number]  Discharge Plan: SNF    Current Diagnoses: Patient Active Problem List   Diagnosis Date Noted   Acute hypoxemic respiratory failure (HCC) 06/02/2024   Non Hodgkin's lymphoma (HCC) 05/02/2024   CKD stage 3 due to type 2 diabetes mellitus (HCC) 04/29/2024   Type 2 diabetes mellitus with chronic kidney disease, without long-term current use of insulin  (HCC) 04/29/2024   Hyperlipidemia associated with type 2 diabetes mellitus (HCC) 04/29/2024   GERD without esophagitis 04/29/2024   Chronic non-seasonal allergic rhinitis 04/29/2024   Bilateral lower extremity edema 04/29/2024   Hypertension associated with stage 3b chronic kidney disease due to type 2 diabetes mellitus (HCC) 04/29/2024   Chronic constipation 04/29/2024   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/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   Malnutrition of moderate degree 04/16/2024   AKI (acute kidney injury) 04/10/2024   Generalized weakness 04/10/2024   Failure to thrive in adult 04/10/2024   DNR (do not resuscitate) 04/10/2024   Abdominal distention 04/04/2024   Arthritis of left knee 07/12/2022   Osteoarthritis of left knee 07/12/2022   Chronic kidney disease, stage 3a (HCC) 12/07/2021   Essential hypertension 03/08/2021   Hyperlipidemia 03/08/2021   Osteoarthrosis, unspecified whether generalized or localized,  lower leg 12/31/2013   S/P total knee replacement 02/21/2013   Stiffness of right knee 01/28/2013   Weakness of right leg 01/28/2013   Difficulty walking 01/28/2013   Arthritis of knee, degenerative 12/13/2012    Orientation RESPIRATION BLADDER Height & Weight     Self, Time, Situation, Place  O2 (2L) Incontinent Weight: 168 lb 10.4 oz (76.5 kg) Height:  5' 9 (175.3 cm)  BEHAVIORAL SYMPTOMS/MOOD NEUROLOGICAL BOWEL NUTRITION STATUS      Continent Diet (heart healthy/carb modified)  AMBULATORY STATUS COMMUNICATION OF NEEDS Skin   Limited Assist Verbally Normal                       Personal Care Assistance Level of Assistance  Bathing, Feeding, Dressing Bathing Assistance: Limited assistance Feeding assistance: Independent Dressing Assistance: Limited assistance     Functional Limitations Info  Sight, Speech, Hearing Sight Info: Impaired (eyeglasses, prosthetic eye) Hearing Info: Impaired Speech Info: Adequate    SPECIAL CARE FACTORS FREQUENCY  OT (By licensed OT), PT (By licensed PT)     PT Frequency: 5x/wk OT Frequency: 5x/wk            Contractures Contractures Info: Not present    Additional Factors Info  Code Status, Allergies, Insulin  Sliding Scale Code Status Info: DNR Allergies Info: Rituximab    Insulin  Sliding Scale Info: See MAR       Current Medications (06/04/2024):  This is the current hospital active medication list Current Facility-Administered Medications  Medication Dose Route Frequency Provider Last Rate Last Admin   acetaminophen  (TYLENOL ) tablet 650 mg  650 mg Oral Q6H PRN Shah, Pratik D,  DO       Or   acetaminophen  (TYLENOL ) suppository 650 mg  650 mg Rectal Q6H PRN Maree, Pratik D, DO       allopurinol  (ZYLOPRIM ) tablet 150 mg  150 mg Oral Daily Maree, Pratik D, DO   150 mg at 06/04/24 9056   aspirin  EC tablet 81 mg  81 mg Oral Daily Shah, Pratik D, DO   81 mg at 06/04/24 9056   azithromycin  (ZITHROMAX ) tablet 500 mg  500 mg Oral  Daily Maree, Pratik D, DO   500 mg at 06/04/24 9056   cefTRIAXone  (ROCEPHIN ) 2 g in sodium chloride  0.9 % 100 mL IVPB  2 g Intravenous Q24H Maree, Pratik D, DO 200 mL/hr at 06/04/24 0948 2 g at 06/04/24 0948   Chlorhexidine  Gluconate Cloth 2 % PADS 6 each  6 each Topical Q0600 Shah, Pratik D, DO   6 each at 06/04/24 0600   enoxaparin  (LOVENOX ) injection 40 mg  40 mg Subcutaneous Q24H Maree, Pratik D, DO   40 mg at 06/03/24 2126   fluticasone  (FLONASE ) 50 MCG/ACT nasal spray 2 spray  2 spray Each Nare Daily Maree, Pratik D, DO   2 spray at 06/04/24 9057   furosemide  (LASIX ) injection 40 mg  40 mg Intravenous BID Shah, Pratik D, DO   40 mg at 06/04/24 9261   insulin  aspart (novoLOG ) injection 0-5 Units  0-5 Units Subcutaneous QHS Maree, Pratik D, DO       insulin  aspart (novoLOG ) injection 0-9 Units  0-9 Units Subcutaneous TID WC Maree, Pratik D, DO   2 Units at 06/04/24 1153   ipratropium-albuterol  (DUONEB) 0.5-2.5 (3) MG/3ML nebulizer solution 3 mL  3 mL Nebulization Q6H PRN Maree, Pratik D, DO       labetalol  (NORMODYNE ) injection 10 mg  10 mg Intravenous Q2H PRN Maree, Pratik D, DO       loratadine  (CLARITIN ) tablet 10 mg  10 mg Oral Daily Maree, Pratik D, DO   10 mg at 06/04/24 9056   melatonin tablet 3 mg  3 mg Oral QHS PRN Daniels, James K, NP   3 mg at 06/02/24 2133   multivitamin (PROSIGHT) tablet 1 tablet  1 tablet Oral BID Maree Bracken D, DO   1 tablet at 06/04/24 9056   mupirocin  ointment (BACTROBAN ) 2 % 1 Application  1 Application Nasal BID Shah, Pratik D, DO   1 Application at 06/04/24 9057   ondansetron  (ZOFRAN ) tablet 4 mg  4 mg Oral Q6H PRN Maree, Pratik D, DO       Or   ondansetron  (ZOFRAN ) injection 4 mg  4 mg Intravenous Q6H PRN Maree, Pratik D, DO       pantoprazole  (PROTONIX ) EC tablet 40 mg  40 mg Oral Daily Maree, Pratik D, DO   40 mg at 06/04/24 0943   polyethylene glycol (MIRALAX  / GLYCOLAX ) packet 17 g  17 g Oral Daily PRN Maree, Pratik D, DO       rosuvastatin  (CRESTOR ) tablet 10 mg   10 mg Oral QODAY Shah, Pratik D, DO   10 mg at 06/03/24 0802   simethicone  (MYLICON) chewable tablet 80 mg  80 mg Oral Q6H PRN Shah, Pratik D, DO         Discharge Medications: Please see discharge summary for a list of discharge medications.  Relevant Imaging Results:  Relevant Lab Results:   Additional Information SSN: [id number]  Hoy DELENA Bigness, LCSW     "

## 2024-06-04 NOTE — Plan of Care (Signed)
" °  Problem: Acute Rehab PT Goals(only PT should resolve) Goal: Pt Will Go Supine/Side To Sit Outcome: Progressing Flowsheets (Taken 06/04/2024 1433) Pt will go Supine/Side to Sit:  with modified independence  with supervision Goal: Patient Will Transfer Sit To/From Stand Outcome: Progressing Flowsheets (Taken 06/04/2024 1433) Patient will transfer sit to/from stand:  with modified independence  with supervision Goal: Pt Will Transfer Bed To Chair/Chair To Bed Outcome: Progressing Flowsheets (Taken 06/04/2024 1433) Pt will Transfer Bed to Chair/Chair to Bed:  with modified independence  with supervision Goal: Pt Will Ambulate Outcome: Progressing Flowsheets (Taken 06/04/2024 1433) Pt will Ambulate:  75 feet  with supervision  with rolling walker   2:33 PM, 06/04/24 Lynwood Music, MPT Physical Therapist with [facility] 336 [phone] office [phone] mobile phone  "

## 2024-06-04 NOTE — Evaluation (Signed)
 Physical Therapy Evaluation Patient Details Name: Cody Palmer MRN: [id number] DOB: 1932-10-20 Today's Date: 06/04/2024  History of Present Illness  Cody Palmer is a 89 y.o. male with medical history significant for CKD stage IIIa, hypertension, dyslipidemia, type 2 diabetes, and non-Hodgkin's lymphoma who presented to the ED with worsening shortness of breath over the last few days.  He would routinely get paracentesis done, but has not required 1 in the last 3 weeks.  He has noticed some leg swelling as well as some mild abdominal distention and has increased cough and fatigue.  He normally does not wear oxygen at home and denies any other symptoms of fevers, chills, abdominal pain, chest pain, or sick contacts.   Clinical Impression  Patient demonstrates labored movement for getting into/out of bed requiring increased time and had drop in SpO2 from 93% to 85% on room air and put back on 2 LPM O2. Patient able to transfer to/from commode but hand difficulty due to BLE weakness and limited to ambulating in room due to c/o fatigue and generalized weakness. Patient tolerated sitting up in chair after therapy. Patient will benefit from continued skilled physical therapy in hospital and recommended venue below to increase strength, balance, endurance for safe ADLs and gait.          If plan is discharge home, recommend the following: A little help with walking and/or transfers;A little help with bathing/dressing/bathroom;Help with stairs or ramp for entrance;Assistance with cooking/housework;Assist for transportation   Can travel by private vehicle   Yes    Equipment Recommendations None recommended by PT  Recommendations for Other Services       Functional Status Assessment Patient has had a recent decline in their functional status and demonstrates the ability to make significant improvements in function in a reasonable and predictable amount of time.     Precautions / Restrictions  Precautions Precautions: Fall Recall of Precautions/Restrictions: Intact Restrictions Weight Bearing Restrictions Per Provider Order: No      Mobility  Bed Mobility Overal bed mobility: Needs Assistance Bed Mobility: Supine to Sit, Sit to Supine     Supine to sit: Contact guard Sit to supine: Contact guard assist   General bed mobility comments: increased time, verbal cueing for safety    Transfers Overall transfer level: Needs assistance Equipment used: Rolling walker (2 wheels) Transfers: Sit to/from Stand, Bed to chair/wheelchair/BSC Sit to Stand: Contact guard assist, Min assist   Step pivot transfers: Min assist       General transfer comment: slow labored movement for sit to stands and transferring to/from commode in bathroom    Ambulation/Gait Ambulation/Gait assistance: Contact guard assist, Min assist Gait Distance (Feet): 25 Feet Assistive device: Rolling walker (2 wheels) Gait Pattern/deviations: Decreased step length - right, Decreased step length - left, Decreased stride length Gait velocity: decreased     General Gait Details: slow labored movement without loss of balance and limited mostly due to c/o fatigue  Stairs            Wheelchair Mobility     Tilt Bed    Modified Rankin (Stroke Patients Only)       Balance Overall balance assessment: Needs assistance Sitting-balance support: Feet supported, No upper extremity supported Sitting balance-Leahy Scale: Good Sitting balance - Comments: seated at EOB   Standing balance support: During functional activity, Bilateral upper extremity supported Standing balance-Leahy Scale: Fair Standing balance comment: using RW  Pertinent Vitals/Pain Pain Assessment Pain Assessment: No/denies pain    Home Living Family/patient expects to be discharged to:: Private residence Living Arrangements: Alone Available Help at Discharge: Family;Available  PRN/intermittently Type of Home: House Home Access: Stairs to enter Entrance Stairs-Rails: Right;Left;Can reach both Entrance Stairs-Number of Steps: 3   Home Layout: One level Home Equipment: Agricultural Consultant (2 wheels);Cane - single point;Grab bars - tub/shower      Prior Function Prior Level of Function : Independent/Modified Independent;Driving             Mobility Comments: Community ambulation using RW PRN, drives ADLs Comments: Independent prior to recent issues.     Extremity/Trunk Assessment   Upper Extremity Assessment Upper Extremity Assessment: Generalized weakness    Lower Extremity Assessment Lower Extremity Assessment: Generalized weakness    Cervical / Trunk Assessment Cervical / Trunk Assessment: Normal  Communication   Communication Communication: Impaired Factors Affecting Communication: Hearing impaired    Cognition Arousal: Alert Behavior During Therapy: WFL for tasks assessed/performed   PT - Cognitive impairments: No apparent impairments                         Following commands: Intact       Cueing Cueing Techniques: Verbal cues, Tactile cues     General Comments      Exercises     Assessment/Plan    PT Assessment Patient needs continued PT services  PT Problem List Decreased strength;Decreased activity tolerance;Decreased balance;Decreased mobility;Cardiopulmonary status limiting activity       PT Treatment Interventions DME instruction;Gait training;Stair training;Functional mobility training;Therapeutic activities;Therapeutic exercise;Balance training;Patient/family education    PT Goals (Current goals can be found in the Care Plan section)  Acute Rehab PT Goals Patient Stated Goal: return home after rehab PT Goal Formulation: With patient Time For Goal Achievement: 06/18/24 Potential to Achieve Goals: Good    Frequency Min 3X/week     Co-evaluation               AM-PAC PT 6 Clicks Mobility   Outcome Measure Help needed turning from your back to your side while in a flat bed without using bedrails?: A Little Help needed moving from lying on your back to sitting on the side of a flat bed without using bedrails?: A Little Help needed moving to and from a bed to a chair (including a wheelchair)?: A Little Help needed standing up from a chair using your arms (e.g., wheelchair or bedside chair)?: A Little Help needed to walk in hospital room?: A Lot Help needed climbing 3-5 steps with a railing? : A Lot 6 Click Score: 16    End of Session Equipment Utilized During Treatment: Oxygen Activity Tolerance: Patient tolerated treatment well;Patient limited by fatigue Patient left: in chair;with call bell/phone within reach Nurse Communication: Mobility status PT Visit Diagnosis: Other abnormalities of gait and mobility (R26.89);Difficulty in walking, not elsewhere classified (R26.2);Muscle weakness (generalized) (M62.81)    Time: 1109-1140 PT Time Calculation (min) (ACUTE ONLY): 31 min   Charges:   PT Evaluation $PT Eval Moderate Complexity: 1 Mod PT Treatments $Therapeutic Activity: 23-37 mins PT General Charges $$ ACUTE PT VISIT: 1 Visit         2:32 PM, 06/04/24 Lynwood Music, MPT Physical Therapist with [facility] [phone] office [phone] mobile phone

## 2024-06-04 NOTE — Progress Notes (Signed)
 " PROGRESS NOTE    Cody Palmer  FMW:[id number] DOB: [birth year] DOA: 06/02/2024 PCP: [name], NP   Brief Narrative:    Cody Palmer is a 89 y.o. male with medical history significant for CKD stage IIIa, hypertension, dyslipidemia, type 2 diabetes, and non-Hodgkin's lymphoma who presented to the ED with worsening shortness of breath over the last few days.  He would routinely get paracentesis done, but has not required 1 in the last 3 weeks.  Patient was admitted with acute hypoxemic respiratory failure secondary to large pleural effusion along with atelectasis as well as sepsis, POA secondary to pneumonia.  Patient underwent thoracentesis on 1/19 with 1.5 L of fluid removal which was the maximum amount that could be removed.  He has apparently another 1-2 L of fluid that needs to be pulled off and this can be done on 1/21.  Anticipate discharge following this if clinically doing well.  Assessment & Plan:   Principal Problem:   Acute hypoxemic respiratory failure (HCC) Active Problems:   Essential hypertension   Abdominal distention   DNR (do not resuscitate)   Follicular lymphoma (HCC)   CKD stage 3 due to type 2 diabetes mellitus (HCC)  Assessment and Plan:   Acute hypoxemic respiratory failure secondary to large right pleural effusion with atelectasis - Plan for ultrasound thoracentesis today - Monitor in stepdown unit until heart rates further stabilized after thoracentesis - Continue IV Lasix  for diuresis 40 mg twice daily - Continue nasal cannula support   Sepsis, POA secondary to pneumonia - Continue IV antibiotics with azithromycin  and Rocephin  - Monitor CBC and lactic acid - Gram stain with no organisms and culture with no growth from thoracentesis on 1/19, repeat thoracentesis ordered for 1/21   Non-Hodgkin lymphoma - Follows with Dr. Davonna and has recently had PET scan and results are pending   Recurring ascites likely secondary to  lymphoma - Small to moderate on abdominal imaging with no tense abdomen or signs of SBP - Hold on paracentesis for now   CKD stage IIIb - Current creatinine near baseline -Continue to monitor   Hypertension - Currently stable, monitor with IV diuresis and hold other antihypertensives   Type 2 diabetes-with mild hyperglycemia - Carb modified diet and SSI  Sinus tachycardia - Likely ongoing in the setting of pleural effusion that needs further drainage.  Thoracentesis ordered for 1/21   DVT prophylaxis:Lovenox  Code Status: DNR Family Communication: Son-in-law at bedside 1/20 Disposition Plan:  .  Consultants:  None  Procedures:  Right sided thoracentesis 1/19  Antimicrobials:  Anti-infectives (From admission, onward)    Start     Dose/Rate Route Frequency Ordered Stop   06/03/24 1000  cefTRIAXone  (ROCEPHIN ) 2 g in sodium chloride  0.9 % 100 mL IVPB        2 g 200 mL/hr over 30 Minutes Intravenous Every 24 hours 06/02/24 1145     06/03/24 1000  azithromycin  (ZITHROMAX ) tablet 500 mg        500 mg Oral Daily 06/02/24 1145     06/02/24 1100  cefTRIAXone  (ROCEPHIN ) 2 g in sodium chloride  0.9 % 100 mL IVPB        2 g 200 mL/hr over 30 Minutes Intravenous  Once 06/02/24 1058 06/02/24 1143   06/02/24 1100  azithromycin  (ZITHROMAX ) tablet 500 mg        500 mg Oral  Once 06/02/24 1058 06/02/24 1110       Subjective: Patient seen and evaluated today with no  new acute complaints or concerns. No acute concerns or events noted overnight.  He states that he was immediately starting to feel better after thoracentesis yesterday.  His dyspnea has improved, but he remains tachycardic this morning.  Objective: Vitals:   06/04/24 0500 06/04/24 0600 06/04/24 0700 06/04/24 0749  BP: 130/70 125/69 130/67   Pulse: (!) 109 (!) 103 (!) 106   Resp: (!) 21 20 20    Temp:    (!) 97.5 F (36.4 C)  TempSrc:    Oral  SpO2: 94% 96% 99%   Weight: 76.5 kg     Height:        Intake/Output  Summary (Last 24 hours) at 06/04/2024 1036 Last data filed at 06/04/2024 0500 Gross per 24 hour  Intake 100 ml  Output 1700 ml  Net -1600 ml   Filed Weights   06/02/24 0821 06/03/24 0500 06/04/24 0500  Weight: 81.2 kg 78.3 kg 76.5 kg    Examination:  General exam: Appears calm and comfortable  Respiratory system: Clear to auscultation. Respiratory effort normal.  Nasal cannula oxygen. Cardiovascular system: S1 & S2 heard, RRR.  Tachycardic Gastrointestinal system: Abdomen is soft Central nervous system: Alert and awake Extremities: No edema Skin: No significant lesions noted Psychiatry: Flat affect.    Data Reviewed: I have personally reviewed following labs and imaging studies  CBC: Recent Labs  Lab 06/02/24 0842 06/03/24 0422 06/04/24 0452  WBC 19.2* 14.7* 13.3*  NEUTROABS 14.1*  --   --   HGB 12.4* 11.6* 11.2*  HCT 40.6 38.1* 36.7*  MCV 89.0 89.4 88.4  PLT 345 318 284   Basic Metabolic Panel: Recent Labs  Lab 06/02/24 0842 06/03/24 0422 06/04/24 0452  NA 143 142 141  K 3.7 4.1 3.7  CL 101 100 100  CO2 25 31 29   GLUCOSE 154* 115* 113*  BUN 26* 27* 25*  CREATININE 0.99 1.10 0.95  CALCIUM  8.6* 8.5* 8.3*  MG  --  1.9 1.9   GFR: Estimated Creatinine Clearance: 52.7 mL/min (by C-G formula based on SCr of 0.95 mg/dL). Liver Function Tests: Recent Labs  Lab 06/02/24 0842 06/03/24 0422 06/04/24 0452  AST 21 19 21   ALT 14 14 10   ALKPHOS 139* 122 114  BILITOT 0.8 0.5 0.5  PROT 6.1* 5.4* 5.1*  ALBUMIN  3.5 3.2* 2.9*   No results for input(s): LIPASE, AMYLASE in the last 168 hours. No results for input(s): AMMONIA in the last 168 hours. Coagulation Profile: Recent Labs  Lab 06/02/24 0842  INR 1.1   Cardiac Enzymes: No results for input(s): CKTOTAL, CKMB, CKMBINDEX, TROPONINI in the last 168 hours. BNP (last 3 results) Recent Labs    06/02/24 0911  PROBNP 933.0*   HbA1C: No results for input(s): HGBA1C in the last 72  hours. CBG: Recent Labs  Lab 06/03/24 0727 06/03/24 1114 06/03/24 1615 06/03/24 2122 06/04/24 0734  GLUCAP 115* 141* 99 129* 129*   Lipid Profile: No results for input(s): CHOL, HDL, LDLCALC, TRIG, CHOLHDL, LDLDIRECT in the last 72 hours. Thyroid Function Tests: No results for input(s): TSH, T4TOTAL, FREET4, T3FREE, THYROIDAB in the last 72 hours. Anemia Panel: No results for input(s): VITAMINB12, FOLATE, FERRITIN, TIBC, IRON, RETICCTPCT in the last 72 hours. Sepsis Labs: Recent Labs  Lab 06/02/24 0842 06/02/24 1010 06/03/24 0422  LATICACIDVEN 3.9* 2.5* 2.4*    Recent Results (from the past 240 hours)  Culture, blood (routine x 2)     Status: None (Preliminary result)   Collection Time: 06/02/24  8:51  AM   Specimen: BLOOD  Result Value Ref Range Status   Specimen Description BLOOD BLOOD LEFT FOREARM  Final   Special Requests   Final    BOTTLES DRAWN AEROBIC AND ANAEROBIC Blood Culture adequate volume   Culture   Final    NO GROWTH 2 DAYS Performed at [facility], [address], [place], [place] [address]    Report Status PENDING  Incomplete  Resp panel by RT-PCR (RSV, Flu A&B, Covid) Anterior Nasal Swab     Status: None   Collection Time: 06/02/24  9:06 AM   Specimen: Anterior Nasal Swab  Result Value Ref Range Status   SARS Coronavirus 2 by RT PCR NEGATIVE NEGATIVE Final    Comment: (NOTE) SARS-CoV-2 target nucleic acids are NOT DETECTED.  The SARS-CoV-2 RNA is generally detectable in upper respiratory specimens during the acute phase of infection. The lowest concentration of SARS-CoV-2 viral copies this assay can detect is 138 copies/mL. A negative result does not preclude SARS-Cov-2 infection and should not be used as the sole basis for treatment or other patient management decisions. A negative result may occur with  improper specimen collection/handling, submission of specimen other than nasopharyngeal swab, presence of  viral mutation(s) within the areas targeted by this assay, and inadequate number of viral copies(<138 copies/mL). A negative result must be combined with clinical observations, patient history, and epidemiological information. The expected result is Negative.  Fact Sheet for Patients:  bloggercourse.com  Fact Sheet for Healthcare Providers:  seriousbroker.it  This test is no t yet approved or cleared by the [place]  FDA and  has been authorized for detection and/or diagnosis of SARS-CoV-2 by FDA under an Emergency Use Authorization (EUA). This EUA will remain  in effect (meaning this test can be used) for the duration of the COVID-19 declaration under Section 564(b)(1) of the Act, 21 U.S.C.section 360bbb-3(b)(1), unless the authorization is terminated  or revoked sooner.       Influenza A by PCR NEGATIVE NEGATIVE Final   Influenza B by PCR NEGATIVE NEGATIVE Final    Comment: (NOTE) The Xpert Xpress SARS-CoV-2/FLU/RSV plus assay is intended as an aid in the diagnosis of influenza from Nasopharyngeal swab specimens and should not be used as a sole basis for treatment. Nasal washings and aspirates are unacceptable for Xpert Xpress SARS-CoV-2/FLU/RSV testing.  Fact Sheet for Patients: bloggercourse.com  Fact Sheet for Healthcare Providers: seriousbroker.it  This test is not yet approved or cleared by the [place]  FDA and has been authorized for detection and/or diagnosis of SARS-CoV-2 by FDA under an Emergency Use Authorization (EUA). This EUA will remain in effect (meaning this test can be used) for the duration of the COVID-19 declaration under Section 564(b)(1) of the Act, 21 U.S.C. section 360bbb-3(b)(1), unless the authorization is terminated or revoked.     Resp Syncytial Virus by PCR NEGATIVE NEGATIVE Final    Comment: (NOTE) Fact Sheet for  Patients: bloggercourse.com  Fact Sheet for Healthcare Providers: seriousbroker.it  This test is not yet approved or cleared by the [place]  FDA and has been authorized for detection and/or diagnosis of SARS-CoV-2 by FDA under an Emergency Use Authorization (EUA). This EUA will remain in effect (meaning this test can be used) for the duration of the COVID-19 declaration under Section 564(b)(1) of the Act, 21 U.S.C. section 360bbb-3(b)(1), unless the authorization is terminated or revoked.  Performed at [facility], [address] [place], [place] [address]   Culture, blood (routine x 2)  Status: None (Preliminary result)   Collection Time: 06/02/24  9:11 AM   Specimen: BLOOD  Result Value Ref Range Status   Specimen Description BLOOD LEFT ANTECUBITAL  Final   Special Requests   Final    BOTTLES DRAWN AEROBIC AND ANAEROBIC Blood Culture adequate volume   Culture   Final    NO GROWTH 2 DAYS Performed at [facility], [address], [place], [place] [address]    Report Status PENDING  Incomplete  Urine Culture     Status: Abnormal   Collection Time: 06/02/24 11:27 AM   Specimen: Urine, Random  Result Value Ref Range Status   Specimen Description   Final    URINE, RANDOM Performed at [facility], [address] [place], [place] [address]    Special Requests   Final    URINE, CLEAN CATCH Performed at [facility], [address] [place], [place] [address]    Culture 10,000 COLONIES/mL ESCHERICHIA COLI (A)  Final   Report Status 06/04/2024 FINAL  Final   Organism ID, Bacteria ESCHERICHIA COLI (A)  Final      Susceptibility   Escherichia coli - MIC*    AMPICILLIN <=2 SENSITIVE Sensitive     CEFAZOLIN  (URINE) Value in next row Sensitive      <=1 SENSITIVEThis is a modified FDA-approved test that has been validated and its performance characteristics determined by the reporting laboratory.  This laboratory is  certified under the Clinical Laboratory Improvement Amendments CLIA as qualified to perform high complexity clinical laboratory testing.    CEFEPIME Value in next row Sensitive      <=1 SENSITIVEThis is a modified FDA-approved test that has been validated and its performance characteristics determined by the reporting laboratory.  This laboratory is certified under the Clinical Laboratory Improvement Amendments CLIA as qualified to perform high complexity clinical laboratory testing.    ERTAPENEM Value in next row Sensitive      <=1 SENSITIVEThis is a modified FDA-approved test that has been validated and its performance characteristics determined by the reporting laboratory.  This laboratory is certified under the Clinical Laboratory Improvement Amendments CLIA as qualified to perform high complexity clinical laboratory testing.    CEFTRIAXONE  Value in next row Sensitive      <=1 SENSITIVEThis is a modified FDA-approved test that has been validated and its performance characteristics determined by the reporting laboratory.  This laboratory is certified under the Clinical Laboratory Improvement Amendments CLIA as qualified to perform high complexity clinical laboratory testing.    CIPROFLOXACIN  Value in next row Sensitive      <=1 SENSITIVEThis is a modified FDA-approved test that has been validated and its performance characteristics determined by the reporting laboratory.  This laboratory is certified under the Clinical Laboratory Improvement Amendments CLIA as qualified to perform high complexity clinical laboratory testing.    GENTAMICIN Value in next row Sensitive      <=1 SENSITIVEThis is a modified FDA-approved test that has been validated and its performance characteristics determined by the reporting laboratory.  This laboratory is certified under the Clinical Laboratory Improvement Amendments CLIA as qualified to perform high complexity clinical laboratory testing.    NITROFURANTOIN Value in  next row Sensitive      <=1 SENSITIVEThis is a modified FDA-approved test that has been validated and its performance characteristics determined by the reporting laboratory.  This laboratory is certified under the Clinical Laboratory Improvement Amendments CLIA as qualified to perform high complexity clinical laboratory testing.    TRIMETH/SULFA  Value in next row Sensitive      <=1 SENSITIVEThis is a modified FDA-approved test that has been validated and its performance characteristics determined by the reporting laboratory.  This laboratory is certified under the Clinical Laboratory Improvement Amendments CLIA as qualified to perform high complexity clinical laboratory testing.    AMPICILLIN/SULBACTAM Value in next row Sensitive      <=1 SENSITIVEThis is a modified FDA-approved test that has been validated and its performance characteristics determined by the reporting laboratory.  This laboratory is certified under the Clinical Laboratory Improvement Amendments CLIA as qualified to perform high complexity clinical laboratory testing.    PIP/TAZO Value in next row Sensitive      <=4 SENSITIVEThis is a modified FDA-approved test that has been validated and its performance characteristics determined by the reporting laboratory.  This laboratory is certified under the Clinical Laboratory Improvement Amendments CLIA as qualified to perform high complexity clinical laboratory testing.    MEROPENEM Value in next row Sensitive      <=4 SENSITIVEThis is a modified FDA-approved test that has been validated and its performance characteristics determined by the reporting laboratory.  This laboratory is certified under the Clinical Laboratory Improvement Amendments CLIA as qualified to perform high complexity clinical laboratory testing.    * 10,000 COLONIES/mL ESCHERICHIA COLI  MRSA Next Gen by PCR, Nasal     Status: Abnormal   Collection Time: 06/02/24 12:12 PM   Specimen: Nasal Mucosa; Nasal Swab  Result Value  Ref Range Status   MRSA by PCR Next Gen DETECTED (A) NOT DETECTED Final    Comment: RESULT CALLED TO, READ BACK BY AND VERIFIED WITH: A RAY AT 1724 ON [id number] BY S DALTON (NOTE) The GeneXpert MRSA Assay (FDA approved for NASAL specimens only), is one component of a comprehensive MRSA colonization surveillance program. It is not intended to diagnose MRSA infection nor to guide or monitor treatment for MRSA infections. Test performance is not FDA approved in patients less than 65 years old. Performed at [facility], [address], [place], [place] [address]   Culture, body fluid w Gram Stain-bottle     Status: None (Preliminary result)   Collection Time: 06/03/24  2:10 PM   Specimen: Pleura  Result Value Ref Range Status   Specimen Description PLEURAL BOTTLES DRAWN AEROBIC AND ANAEROBIC  Final   Special Requests 10CC  Final   Culture   Final    NO GROWTH < 24 HOURS Performed at [facility], [address], [place], [place] [address]    Report Status PENDING  Incomplete  Gram stain     Status: None   Collection Time: 06/03/24  2:10 PM   Specimen: Pleura  Result Value Ref Range Status   Specimen Description PLEURAL  Final   Special Requests NONE  Final   Gram Stain   Final    NO ORGANISMS SEEN WBC PRESENT,BOTH PMN AND MONONUCLEAR CYTOSPIN SMEAR Performed at [facility], [address] [place], [place] [address]    Report Status 06/03/2024 FINAL  Final         Radiology Studies: US  THORACENTESIS RIGHT ASP PLEURAL SPACE W/IMG GUIDE Result Date: 06/03/2024 INDICATION: 142230 Pleural effusion 142230 Patient with history of lymphoma, recurrent ascites. Found to have new right pleural effusion on recent imaging. Request for diagnostic and therapeutic right thoracentesis. EXAM: ULTRASOUND GUIDED RIGHT THORACENTESIS MEDICATIONS: 5 mL 1% lidocaine  COMPLICATIONS: None immediate. PROCEDURE: An ultrasound guided thoracentesis was thoroughly discussed with the patient and questions  answered. The  benefits, risks, alternatives and complications were also discussed. The patient understands and wishes to proceed with the procedure. Written consent was obtained. Ultrasound was performed to localize and mark an adequate pocket of fluid in the right chest. The area was then prepped and draped in the normal sterile fashion. 1% Lidocaine  was used for local anesthesia. Under ultrasound guidance a 6 Fr Safe-T-Centesis catheter was introduced. Thoracentesis was performed. The catheter was removed and a dressing applied. FINDINGS: A total of approximately 1.5 L of bloody fluid was removed. Procedure was stopped at this amount due to first-time thoracentesis limit, patient with significant residual pleural effusion on post procedure ultrasound. Samples were sent to the laboratory as requested by the clinical team. IMPRESSION: Successful ultrasound guided RIGHT thoracentesis yielding 1.5 L of pleural fluid. Performed by Clotilda Hesselbach, PA-C under supervision of Thom Hall, MD Electronically Signed   By: Thom Hall M.D.   On: 06/03/2024 16:31   DG Chest Port 1 View Result Date: 06/03/2024 CLINICAL DATA:  Post thoracentesis EXAM: PORTABLE CHEST 1 VIEW COMPARISON:  06/02/2024, chest CT 06/02/2024 FINDINGS: Persistent large right-sided pleural effusion with near complete opacification of right thorax, small amount of aerated right apex. No gross pneumothorax, suspect soft tissue or skin artifact over the right apex and lateral aspect of the chest. Probable cardiomegaly. Suspect small left effusion. Increasing airspace disease at left base. Aortic atherosclerosis. IMPRESSION: 1. Persistent large right-sided pleural effusion with near complete opacification of right thorax. No gross pneumothorax. 2. Suspect small left effusion with increasing airspace disease at the left base. Electronically Signed   By: [name] Bun M.D.   On: 06/03/2024 15:26        Scheduled Meds:  allopurinol   150 mg Oral  Daily   aspirin  EC  81 mg Oral Daily   azithromycin   500 mg Oral Daily   Chlorhexidine  Gluconate Cloth  6 each Topical Q0600   enoxaparin  (LOVENOX ) injection  40 mg Subcutaneous Q24H   fluticasone   2 spray Each Nare Daily   furosemide   40 mg Intravenous BID   insulin  aspart  0-5 Units Subcutaneous QHS   insulin  aspart  0-9 Units Subcutaneous TID WC   loratadine   10 mg Oral Daily   multivitamin  1 tablet Oral BID   mupirocin  ointment  1 Application Nasal BID   pantoprazole   40 mg Oral Daily   rosuvastatin   10 mg Oral QODAY   Continuous Infusions:  cefTRIAXone  (ROCEPHIN )  IV 2 g (06/04/24 0948)     LOS: 1 day    Time spent: 55 minutes    Stepahnie Campo D Maree, DO [facility]  If 7PM-7AM, please contact night-coverage [email or web address] 06/04/2024, 10:36 AM   "

## 2024-06-05 ENCOUNTER — Inpatient Hospital Stay (HOSPITAL_COMMUNITY)

## 2024-06-05 DIAGNOSIS — J9601 Acute respiratory failure with hypoxia: Secondary | ICD-10-CM | POA: Diagnosis not present

## 2024-06-05 LAB — GLUCOSE, CAPILLARY
Glucose-Capillary: 135 mg/dL — ABNORMAL HIGH (ref 70–99)
Glucose-Capillary: 156 mg/dL — ABNORMAL HIGH (ref 70–99)
Glucose-Capillary: 157 mg/dL — ABNORMAL HIGH (ref 70–99)

## 2024-06-05 LAB — CBC
HCT: 35.1 % — ABNORMAL LOW (ref 39.0–52.0)
Hemoglobin: 10.7 g/dL — ABNORMAL LOW (ref 13.0–17.0)
MCH: 27 pg (ref 26.0–34.0)
MCHC: 30.5 g/dL (ref 30.0–36.0)
MCV: 88.6 fL (ref 80.0–100.0)
Platelets: 274 K/uL (ref 150–400)
RBC: 3.96 MIL/uL — ABNORMAL LOW (ref 4.22–5.81)
RDW: 17.5 % — ABNORMAL HIGH (ref 11.5–15.5)
WBC: 13.2 K/uL — ABNORMAL HIGH (ref 4.0–10.5)
nRBC: 0 % (ref 0.0–0.2)

## 2024-06-05 LAB — MAGNESIUM: Magnesium: 1.9 mg/dL (ref 1.7–2.4)

## 2024-06-05 LAB — BASIC METABOLIC PANEL WITH GFR
Anion gap: 8 (ref 5–15)
BUN: 24 mg/dL — ABNORMAL HIGH (ref 8–23)
CO2: 33 mmol/L — ABNORMAL HIGH (ref 22–32)
Calcium: 8.1 mg/dL — ABNORMAL LOW (ref 8.9–10.3)
Chloride: 98 mmol/L (ref 98–111)
Creatinine, Ser: 1.06 mg/dL (ref 0.61–1.24)
GFR, Estimated: >60 mL/min
Glucose, Bld: 113 mg/dL — ABNORMAL HIGH (ref 70–99)
Potassium: 3.3 mmol/L — ABNORMAL LOW (ref 3.5–5.1)
Sodium: 139 mmol/L (ref 135–145)

## 2024-06-05 LAB — LACTIC ACID, PLASMA: Lactic Acid, Venous: 1.3 mmol/L (ref 0.5–1.9)

## 2024-06-05 MED ORDER — LIDOCAINE HCL (PF) 2 % IJ SOLN
INTRAMUSCULAR | Status: AC
  Filled 2024-06-05: qty 10

## 2024-06-05 MED ORDER — POTASSIUM CHLORIDE CRYS ER 20 MEQ PO TBCR
40.0000 meq | EXTENDED_RELEASE_TABLET | Freq: Once | ORAL | Status: AC
  Administered 2024-06-05: 40 meq via ORAL
  Filled 2024-06-05: qty 2

## 2024-06-05 MED ORDER — LIDOCAINE HCL (PF) 2 % IJ SOLN
10.0000 mL | Freq: Once | INTRAMUSCULAR | Status: AC
  Administered 2024-06-05: 10 mL via INTRADERMAL

## 2024-06-05 NOTE — TOC Progression Note (Addendum)
 Transition of Care [facility]) - Progression Note    Patient Details  Name: Cody Palmer MRN: [id number] Date of Birth: [birth year]  [facility] CM/SW Contact  Hoy DELENA Bigness, LCSW Phone Number: 06/05/2024, 2:49 PM  Clinical Narrative:    Pt currently with no bed offers for SNF. Per [facility] pt was at their facility from 12/10-12/18 and again from 12/29-1/10 which, puts pt in his copay days for SNF. CSW relayed this information to pt's daughter. Daughter to discuss with pt and sister to determine plan for SNF vs returning home w/ [facility]. ICM will continue to follow.   ADDENDUM: CSW spoke with pt's daughter, [name], who shares that after speaking with her sister and BIL plan will be for pt to return home with [facility] through [facility]. [name] [name] plans to stay with pt at discharge to provide additional support.    Expected Discharge Plan: Skilled Nursing Facility Barriers to Discharge: Continued Medical Work up               Expected Discharge Plan and Services In-house Referral: Clinical Social Work Discharge Planning Services: NA Post Acute Care Choice: Skilled Nursing Facility, Home Health Living arrangements for the past 2 months: Single Family Home                                       Social Drivers of Health (SDOH) Interventions SDOH Screenings   Food Insecurity: No Food Insecurity (06/02/2024)  Housing: Low Risk (06/02/2024)  Transportation Needs: No Transportation Needs (06/02/2024)  Utilities: Not At Risk (06/02/2024)  Depression (PHQ2-9): Low Risk (05/28/2024)  Social Connections: Unknown (06/02/2024)  Recent Concern: Social Connections - Moderately Isolated (04/10/2024)  Tobacco Use: Medium Risk (06/03/2024)    Readmission Risk Interventions    05/13/2024   10:44 AM 04/19/2024    1:46 PM  Readmission Risk Prevention Plan  Transportation Screening Complete Complete  PCP or Specialist Appt within 3-5 Days  Not Complete  HRI or Home Care Consult  Complete Complete  Social Work Consult for Recovery Care Planning/Counseling Complete Complete  Palliative Care Screening Not Applicable Not Applicable  Medication Review Oceanographer) Complete Complete

## 2024-06-05 NOTE — Consult Note (Signed)
 [facility] Consultation Hematology/Oncology  CONSULTING PHYSICIAN: Dr. Maree  REASON FOR CONSULT: Follicular lymphoma    HISTORY OF PRESENT ILLNESS:   Cody Palmer is a 89 y.o. 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  Patient today was accompanied by a friend at bedside.  Reports feeling better after thoracentesis.  He has no other complaints at this time.    MEDICATIONS: I have reviewed the patient's current medications.       PERFORMANCE STATUS: The patient's performance status is 2 - Symptomatic, <50% confined to bed  PHYSICAL EXAM: Most Recent Vital Signs: Blood pressure (!) 134/103, pulse 92, temperature 97.8 F (36.6 C), temperature source Oral, resp. rate 12, height 6' (1.829 m), weight 270 lb (122.5 kg), SpO2 91%.  GENERAL:alert, no distress and comfortable SKIN: skin color, texture, turgor are normal, no rashes or significant lesions LYMPH:  no palpable lymphadenopathy in the cervical, axillary or inguinal LUNGS: Right-sided breath sounds decreased, left-sided normal breath sounds HEART: regular rate & rhythm and no murmurs and no lower extremity edema ABDOMEN:abdomen soft, non-tender and normal bowel sounds Musculoskeletal:no cyanosis of digits and no clubbing  PSYCH: alert & oriented x 3 with fluent speech   LABORATORY DATA:   Last CBC Lab Results  Component Value Date   WBC 13.2 (H) 06/05/2024   HGB 10.7 (L) 06/05/2024   HCT 35.1 (L) 06/05/2024   MCV 88.6 06/05/2024   MCH 27.0 06/05/2024   RDW 17.5 (H) 06/05/2024   PLT 274 06/05/2024     Last metabolic panel Lab Results  Component Value Date   GLUCOSE 113 (H) 06/05/2024   NA 139 06/05/2024   K 3.3 (L) 06/05/2024   CL 98 06/05/2024   CO2 33 (H) 06/05/2024   BUN 24 (H) 06/05/2024   CREATININE 1.06 06/05/2024   GFRNONAA >60 06/05/2024   CALCIUM   8.1 (L) 06/05/2024   PHOS 3.2 05/07/2024   PROT 5.1 (L) 06/04/2024   ALBUMIN  2.9 (L) 06/04/2024   LABGLOB 2.4 03/27/2024   BILITOT 0.5 06/04/2024   ALKPHOS 114 06/04/2024   AST 21 06/04/2024   ALT 10 06/04/2024   ANIONGAP 8 06/05/2024      RADIOGRAPHY:  US  THORACENTESIS RIGHT ASP PLEURAL SPACE W/IMG GUIDE INDICATION: 89 year old male. History of lymphoma with recurrent right-sided pleural effusion. Request is for therapeutic right-sided thoracentesis.  EXAM: ULTRASOUND GUIDED THERAPEUTIC RIGHT-SIDED THORACENTESIS  MEDICATIONS: Lidocaine  1% 10 mL  COMPLICATIONS: None immediate.  PROCEDURE: An ultrasound guided thoracentesis was thoroughly discussed with the patient and questions answered. The benefits, risks, alternatives and complications were also discussed. The patient understands and wishes to proceed with the procedure. Written consent was obtained.  Ultrasound was performed to localize and mark an adequate pocket of fluid in the right chest. The area was then prepped and draped in the normal sterile fashion. 1% Lidocaine  was used for local anesthesia. Under ultrasound guidance a 19 gauge, 7-cm, Yueh catheter was introduced. Thoracentesis was performed. The catheter was removed and a dressing applied.  FINDINGS: A total of approximately 2.9 L of serous colored fluid was removed.  IMPRESSION: Successful ultrasound guided therapeutic right-sided thoracentesis yielding 2.9 of serous colored pleural fluid.  Performed by Delon Beagle NP  Electronically Signed   By: Ester Sides M.D.   On: 06/05/2024 09:12 DG Chest Portable 1 View CLINICAL DATA:  Post right-sided thoracentesis.  EXAM: PORTABLE CHEST 1 VIEW  COMPARISON:  06/03/2024  FINDINGS: Lungs are hypoinflated demonstrate a moderate to large right pleural effusion with improvement post thoracentesis. Better aeration over the right upper lobe/apex. Likely compressive atelectasis over  the right base. No pneumothorax. Left lung is clear. Cardiomediastinal silhouette and remainder of the exam is unchanged.  IMPRESSION: Moderate to large right pleural effusion with improvement post thoracentesis. No pneumothorax.  Electronically Signed   By: Toribio Agreste M.D.   On: 06/05/2024 09:12        ASSESSMENT:   Patient is a 89 year old male with follicular lymphoma on rituximab  now presented with large pleural effusion with atelectasis.  PLAN:   1.  Pleural effusion: Likely secondary to ?pneumonia, unsure if this is secondary to lymphoma as patient has significant improvement in all the areas in the recent PET scan. Not a known complication of rituximab .  - Ordered flow cytometry on the fluid - Recommend doing lights criteria with next thoracentesis if possible.  Please obtain pleural fluid LDH and pleural fluid protein level along with serum protein and serum LDH at the same time to calculate lights criteria.  This will help to differentiate between exudate and transudate to help us  with differential.  2.  Follicular lymphoma: Had significant improvement with rituximab   - Will reschedule rituximab  infusions outpatient as scheduled follow-up appointments when he is discharged.  No further hematological recommendations at this time.  Thank you for taking care of the patient.  Thank you for involving us  in this patient's care.  Please to reach out with any questions or concerns.  Mickiel Dry, MD Hematology/Oncology [facility]

## 2024-06-05 NOTE — Progress Notes (Signed)
 Pt had a right ultrasound guided thoracentesis performing. Removed 2.9 serous colored fluid. Pt tolerated well. VSS. Pt taken to chest xray in no acute distress

## 2024-06-05 NOTE — Progress Notes (Signed)
 Patient transferred to [address] with all belongings. Report given to [employer].

## 2024-06-05 NOTE — Plan of Care (Signed)

## 2024-06-05 NOTE — Progress Notes (Addendum)
 " PROGRESS NOTE    [name]  FMW:[id number] DOB: 1932/07/24 DOA: 06/02/2024 PCP: [name], NP   Brief Narrative:    JUSTINIAN Palmer is a 89 y.o. male with medical history significant for CKD stage IIIa, hypertension, dyslipidemia, type 2 diabetes, and non-Hodgkin's lymphoma who presented to the ED with worsening shortness of breath over the last few days.  He would routinely get paracentesis done, but has not required 1 in the last 3 weeks.  Patient was admitted with acute hypoxemic respiratory failure secondary to large pleural effusion along with atelectasis as well as sepsis, POA secondary to pneumonia.  Patient underwent thoracentesis on 1/19 with 1.5 L of fluid removal which was the maximum amount that could be removed.  He has apparently another 1-2 L of fluid that needs to be pulled off and this can be done on 1/21.  Anticipate discharge following this if clinically doing well.  Assessment & Plan:   Principal Problem:   Acute hypoxemic respiratory failure (HCC) Active Problems:   Essential hypertension   Abdominal distention   DNR (do not resuscitate)   Follicular lymphoma (HCC)   CKD stage 3 due to type 2 diabetes mellitus (HCC)  Assessment and Plan:   1)Acute hypoxemic respiratory failure secondary to large right pleural effusion with atelectasis --Etiology of rather large right-sided pleural effusion could be lymphoma versus pneumonia versus heart failure -Patient had Rt Thora on 06/03/2024 with 1.5 L removed and again Thora on the right side on 06/05/2024 with 2.9 L removed --Repeat x-ray continues to show significant amount of pleural effusion on the right -Weaned down to 2 L of oxygen at this time -Discussed with patient and family members that he may be a candidate for PleurX catheter --They want to think about it for now -We beneficial to get LDH of pleural fluid and  serum LDH with next Thora to determine exudate versus transudate--suspect this is  an exudate most likely due to lymphoma - Continue IV Lasix  for diuresis 40 mg twice daily   2)Sepsis, POA secondary to pneumonia -Chest imaging with mostly right-sided pleural effusion unable to exclude possible underlying pneumonia at this time - Continue  azithromycin  and Rocephin  - Monitor CBC and lactic acid - Gram stain with no organisms and culture with no growth from thoracentesis on 1/19, repeat thoracentesis ordered for 1/21   Non-Hodgkin lymphoma -PET scan results from 05/30/2021 showed significant improvement - Follows with Dr. Davonna    Recurring ascites likely secondary to lymphoma - Small to moderate on abdominal imaging with no tense abdomen or signs of SBP - Hold on paracentesis for now   CKD stage IIIb - Current creatinine near baseline -Continue to monitor   Hypertension - Currently stable, monitor with IV diuresis and hold other antihypertensives   Type 2 diabetes-with mild hyperglycemia - Carb modified diet and SSI  Sinus tachycardia - Likely ongoing in the setting of pleural effusion that needs further drainage.  Thoracentesis ordered for 1/21   DVT prophylaxis:Lovenox  Code Status: DNR Family Communication: Discussed with daughter [name] at bedside Disposition Plan:  .  Consultants:  None  Procedures:  Right sided thoracentesis 1/19  Antimicrobials:  Anti-infectives (From admission, onward)    Start     Dose/Rate Route Frequency Ordered Stop   06/03/24 1000  cefTRIAXone  (ROCEPHIN ) 2 g in sodium chloride  0.9 % 100 mL IVPB        2 g 200 mL/hr over 30 Minutes Intravenous Every 24 hours 06/02/24 1145  06/03/24 1000  azithromycin  (ZITHROMAX ) tablet 500 mg        500 mg Oral Daily 06/02/24 1145     06/02/24 1100  cefTRIAXone  (ROCEPHIN ) 2 g in sodium chloride  0.9 % 100 mL IVPB        2 g 200 mL/hr over 30 Minutes Intravenous  Once 06/02/24 1058 06/02/24 1143   06/02/24 1100  azithromycin  (ZITHROMAX ) tablet 500 mg        500 mg Oral  Once 06/02/24  1058 06/02/24 1110       Subjective: -Patient's daughter [name] at bedside - Patient's son-in-law Mr Raphael on speaker phone in the room - Tolerated right-sided thoracentesis well on 06/05/2024 - Dyspnea on exertion minimally improved -  oxygen requirement is down to 2 L No fever  Or chills  - Voiding well  Objective: Vitals:   06/05/24 1500 06/05/24 1600 06/05/24 1700 06/05/24 1800  BP: (!) 130/59 (!) 117/58 (!) 156/85 (!) 130/53  Pulse:  (!) 104 (!) 116 (!) 103  Resp: (!) 22 (!) 23 19 20   Temp:      TempSrc:      SpO2:  96% 97% 96%  Weight:      Height:        Intake/Output Summary (Last 24 hours) at 06/05/2024 1908 Last data filed at 06/05/2024 1600 Gross per 24 hour  Intake 827.26 ml  Output 1300 ml  Net -472.74 ml   Filed Weights   06/03/24 0500 06/04/24 0500 06/05/24 0500  Weight: 78.3 kg 76.5 kg 77.9 kg    Physical Exam Gen:- Awake Alert, dyspnea on exertion patient  HEENT:- [place].AT, No sclera icterus Nose- [place] 2L/min Neck-Supple Neck,No JVD,.  Lungs-diminished breath sounds on the right, no wheezing  CV- S1, S2 normal, RRR Abd-  +ve B.Sounds, Abd Soft, No tenderness,    Extremity/Skin:- No  edema,   good pedal pulses  Psych-affect is appropriate, oriented x3 Neuro-generalized weakness, no new focal deficits, no tremors   Data Reviewed: I have personally reviewed following labs and imaging studies  CBC: Recent Labs  Lab 06/02/24 0842 06/03/24 0422 06/04/24 0452 06/05/24 0422  WBC 19.2* 14.7* 13.3* 13.2*  NEUTROABS 14.1*  --   --   --   HGB 12.4* 11.6* 11.2* 10.7*  HCT 40.6 38.1* 36.7* 35.1*  MCV 89.0 89.4 88.4 88.6  PLT 345 318 284 274   Basic Metabolic Panel: Recent Labs  Lab 06/02/24 0842 06/03/24 0422 06/04/24 0452 06/05/24 0422  NA 143 142 141 139  K 3.7 4.1 3.7 3.3*  CL 101 100 100 98  CO2 25 31 29  33*  GLUCOSE 154* 115* 113* 113*  BUN 26* 27* 25* 24*  CREATININE 0.99 1.10 0.95 1.06  CALCIUM  8.6* 8.5* 8.3* 8.1*  MG  --  1.9 1.9  1.9   GFR: Estimated Creatinine Clearance: 47.2 mL/min (by C-G formula based on SCr of 1.06 mg/dL). Liver Function Tests: Recent Labs  Lab 06/02/24 0842 06/03/24 0422 06/04/24 0452  AST 21 19 21   ALT 14 14 10   ALKPHOS 139* 122 114  BILITOT 0.8 0.5 0.5  PROT 6.1* 5.4* 5.1*  ALBUMIN  3.5 3.2* 2.9*   Coagulation Profile: Recent Labs  Lab 06/02/24 0842  INR 1.1   BNP (last 3 results) Recent Labs    06/02/24 0911  PROBNP 933.0*   HbA1C: No results for input(s): HGBA1C in the last 72 hours. CBG: Recent Labs  Lab 06/04/24 1617 06/04/24 2119 06/05/24 0733 06/05/24 0758 06/05/24 1113  GLUCAP  159* 115* 135* 156* 157*   Sepsis Labs: Recent Labs  Lab 06/02/24 0842 06/02/24 1010 06/03/24 0422 06/05/24 0422  LATICACIDVEN 3.9* 2.5* 2.4* 1.3    Recent Results (from the past 240 hours)  Culture, blood (routine x 2)     Status: None (Preliminary result)   Collection Time: 06/02/24  8:51 AM   Specimen: BLOOD  Result Value Ref Range Status   Specimen Description BLOOD BLOOD LEFT FOREARM  Final   Special Requests   Final    BOTTLES DRAWN AEROBIC AND ANAEROBIC Blood Culture adequate volume   Culture   Final    NO GROWTH 3 DAYS Performed at [facility], [address] [place], [place] [address]    Report Status PENDING  Incomplete  Resp panel by RT-PCR (RSV, Flu A&B, Covid) Anterior Nasal Swab     Status: None   Collection Time: 06/02/24  9:06 AM   Specimen: Anterior Nasal Swab  Result Value Ref Range Status   SARS Coronavirus 2 by RT PCR NEGATIVE NEGATIVE Final    Comment: (NOTE) SARS-CoV-2 target nucleic acids are NOT DETECTED.  The SARS-CoV-2 RNA is generally detectable in upper respiratory specimens during the acute phase of infection. The lowest concentration of SARS-CoV-2 viral copies this assay can detect is 138 copies/mL. A negative result does not preclude SARS-Cov-2 infection and should not be used as the sole basis for treatment or other patient  management decisions. A negative result may occur with  improper specimen collection/handling, submission of specimen other than nasopharyngeal swab, presence of viral mutation(s) within the areas targeted by this assay, and inadequate number of viral copies(<138 copies/mL). A negative result must be combined with clinical observations, patient history, and epidemiological information. The expected result is Negative.  Fact Sheet for Patients:  bloggercourse.com  Fact Sheet for Healthcare Providers:  seriousbroker.it  This test is no t yet approved or cleared by the [place]  FDA and  has been authorized for detection and/or diagnosis of SARS-CoV-2 by FDA under an Emergency Use Authorization (EUA). This EUA will remain  in effect (meaning this test can be used) for the duration of the COVID-19 declaration under Section 564(b)(1) of the Act, 21 U.S.C.section 360bbb-3(b)(1), unless the authorization is terminated  or revoked sooner.       Influenza A by PCR NEGATIVE NEGATIVE Final   Influenza B by PCR NEGATIVE NEGATIVE Final    Comment: (NOTE) The Xpert Xpress SARS-CoV-2/FLU/RSV plus assay is intended as an aid in the diagnosis of influenza from Nasopharyngeal swab specimens and should not be used as a sole basis for treatment. Nasal washings and aspirates are unacceptable for Xpert Xpress SARS-CoV-2/FLU/RSV testing.  Fact Sheet for Patients: bloggercourse.com  Fact Sheet for Healthcare Providers: seriousbroker.it  This test is not yet approved or cleared by the [place]  FDA and has been authorized for detection and/or diagnosis of SARS-CoV-2 by FDA under an Emergency Use Authorization (EUA). This EUA will remain in effect (meaning this test can be used) for the duration of the COVID-19 declaration under Section 564(b)(1) of the Act, 21 U.S.C. section 360bbb-3(b)(1),  unless the authorization is terminated or revoked.     Resp Syncytial Virus by PCR NEGATIVE NEGATIVE Final    Comment: (NOTE) Fact Sheet for Patients: bloggercourse.com  Fact Sheet for Healthcare Providers: seriousbroker.it  This test is not yet approved or cleared by the [place]  FDA and has been authorized for detection and/or diagnosis of SARS-CoV-2 by FDA under an Emergency Use Authorization (EUA).  This EUA will remain in effect (meaning this test can be used) for the duration of the COVID-19 declaration under Section 564(b)(1) of the Act, 21 U.S.C. section 360bbb-3(b)(1), unless the authorization is terminated or revoked.  Performed at [facility], [address] [place], [place] [address]   Culture, blood (routine x 2)     Status: None (Preliminary result)   Collection Time: 06/02/24  9:11 AM   Specimen: BLOOD  Result Value Ref Range Status   Specimen Description BLOOD LEFT ANTECUBITAL  Final   Special Requests   Final    BOTTLES DRAWN AEROBIC AND ANAEROBIC Blood Culture adequate volume   Culture   Final    NO GROWTH 3 DAYS Performed at [facility], [address], [place], [place] [address]    Report Status PENDING  Incomplete  Urine Culture     Status: Abnormal   Collection Time: 06/02/24 11:27 AM   Specimen: Urine, Random  Result Value Ref Range Status   Specimen Description   Final    URINE, RANDOM Performed at [facility], [address], [place], [place] [address]    Special Requests   Final    URINE, CLEAN CATCH Performed at [facility], [address], [place], [place] [address]    Culture 10,000 COLONIES/mL ESCHERICHIA COLI (A)  Final   Report Status 06/04/2024 FINAL  Final   Organism ID, Bacteria ESCHERICHIA COLI (A)  Final      Susceptibility   Escherichia coli - MIC*    AMPICILLIN <=2 SENSITIVE Sensitive     CEFAZOLIN  (URINE) Value in next row Sensitive      <=1 SENSITIVEThis is a  modified FDA-approved test that has been validated and its performance characteristics determined by the reporting laboratory.  This laboratory is certified under the Clinical Laboratory Improvement Amendments CLIA as qualified to perform high complexity clinical laboratory testing.    CEFEPIME Value in next row Sensitive      <=1 SENSITIVEThis is a modified FDA-approved test that has been validated and its performance characteristics determined by the reporting laboratory.  This laboratory is certified under the Clinical Laboratory Improvement Amendments CLIA as qualified to perform high complexity clinical laboratory testing.    ERTAPENEM Value in next row Sensitive      <=1 SENSITIVEThis is a modified FDA-approved test that has been validated and its performance characteristics determined by the reporting laboratory.  This laboratory is certified under the Clinical Laboratory Improvement Amendments CLIA as qualified to perform high complexity clinical laboratory testing.    CEFTRIAXONE  Value in next row Sensitive      <=1 SENSITIVEThis is a modified FDA-approved test that has been validated and its performance characteristics determined by the reporting laboratory.  This laboratory is certified under the Clinical Laboratory Improvement Amendments CLIA as qualified to perform high complexity clinical laboratory testing.    CIPROFLOXACIN  Value in next row Sensitive      <=1 SENSITIVEThis is a modified FDA-approved test that has been validated and its performance characteristics determined by the reporting laboratory.  This laboratory is certified under the Clinical Laboratory Improvement Amendments CLIA as qualified to perform high complexity clinical laboratory testing.    GENTAMICIN Value in next row Sensitive      <=1 SENSITIVEThis is a modified FDA-approved test that has been validated and its performance characteristics determined by the reporting laboratory.  This laboratory is certified under the  Clinical Laboratory Improvement Amendments CLIA as qualified to perform high complexity clinical laboratory testing.  NITROFURANTOIN Value in next row Sensitive      <=1 SENSITIVEThis is a modified FDA-approved test that has been validated and its performance characteristics determined by the reporting laboratory.  This laboratory is certified under the Clinical Laboratory Improvement Amendments CLIA as qualified to perform high complexity clinical laboratory testing.    TRIMETH/SULFA Value in next row Sensitive      <=1 SENSITIVEThis is a modified FDA-approved test that has been validated and its performance characteristics determined by the reporting laboratory.  This laboratory is certified under the Clinical Laboratory Improvement Amendments CLIA as qualified to perform high complexity clinical laboratory testing.    AMPICILLIN/SULBACTAM Value in next row Sensitive      <=1 SENSITIVEThis is a modified FDA-approved test that has been validated and its performance characteristics determined by the reporting laboratory.  This laboratory is certified under the Clinical Laboratory Improvement Amendments CLIA as qualified to perform high complexity clinical laboratory testing.    PIP/TAZO Value in next row Sensitive      <=4 SENSITIVEThis is a modified FDA-approved test that has been validated and its performance characteristics determined by the reporting laboratory.  This laboratory is certified under the Clinical Laboratory Improvement Amendments CLIA as qualified to perform high complexity clinical laboratory testing.    MEROPENEM Value in next row Sensitive      <=4 SENSITIVEThis is a modified FDA-approved test that has been validated and its performance characteristics determined by the reporting laboratory.  This laboratory is certified under the Clinical Laboratory Improvement Amendments CLIA as qualified to perform high complexity clinical laboratory testing.    * 10,000 COLONIES/mL ESCHERICHIA  COLI  MRSA Next Gen by PCR, Nasal     Status: Abnormal   Collection Time: 06/02/24 12:12 PM   Specimen: Nasal Mucosa; Nasal Swab  Result Value Ref Range Status   MRSA by PCR Next Gen DETECTED (A) NOT DETECTED Final    Comment: RESULT CALLED TO, READ BACK BY AND VERIFIED WITH: A RAY AT 1724 ON [id number] BY S DALTON (NOTE) The GeneXpert MRSA Assay (FDA approved for NASAL specimens only), is one component of a comprehensive MRSA colonization surveillance program. It is not intended to diagnose MRSA infection nor to guide or monitor treatment for MRSA infections. Test performance is not FDA approved in patients less than 30 years old. Performed at [facility], [address], [place], [place] [address]   Culture, body fluid w Gram Stain-bottle     Status: None (Preliminary result)   Collection Time: 06/03/24  2:10 PM   Specimen: Pleura  Result Value Ref Range Status   Specimen Description PLEURAL BOTTLES DRAWN AEROBIC AND ANAEROBIC  Final   Special Requests 10CC  Final   Culture   Final    NO GROWTH 2 DAYS Performed at [facility], [address], [place], [place] [address]    Report Status PENDING  Incomplete  Gram stain     Status: None   Collection Time: 06/03/24  2:10 PM   Specimen: Pleura  Result Value Ref Range Status   Specimen Description PLEURAL  Final   Special Requests NONE  Final   Gram Stain   Final    NO ORGANISMS SEEN WBC PRESENT,BOTH PMN AND MONONUCLEAR CYTOSPIN SMEAR Performed at [facility], [address] [place], [place] [address]    Report Status 06/03/2024 FINAL  Final    Radiology Studies: US  THORACENTESIS RIGHT ASP PLEURAL SPACE W/IMG GUIDE Result Date: 06/05/2024 INDICATION: 89 year old male. History of lymphoma with recurrent  right-sided pleural effusion. Request is for therapeutic right-sided thoracentesis. EXAM: ULTRASOUND GUIDED THERAPEUTIC RIGHT-SIDED THORACENTESIS MEDICATIONS: Lidocaine  1% 10 mL COMPLICATIONS: None immediate. PROCEDURE: An  ultrasound guided thoracentesis was thoroughly discussed with the patient and questions answered. The benefits, risks, alternatives and complications were also discussed. The patient understands and wishes to proceed with the procedure. Written consent was obtained. Ultrasound was performed to localize and mark an adequate pocket of fluid in the right chest. The area was then prepped and draped in the normal sterile fashion. 1% Lidocaine  was used for local anesthesia. Under ultrasound guidance a 19 gauge, 7-cm, Yueh catheter was introduced. Thoracentesis was performed. The catheter was removed and a dressing applied. FINDINGS: A total of approximately 2.9 L of serous colored fluid was removed. IMPRESSION: Successful ultrasound guided therapeutic right-sided thoracentesis yielding 2.9 of serous colored pleural fluid. Performed by Delon Beagle NP Electronically Signed   By: Ester Sides M.D.   On: 06/05/2024 09:12   DG Chest Portable 1 View Result Date: 06/05/2024 CLINICAL DATA:  Post right-sided thoracentesis. EXAM: PORTABLE CHEST 1 VIEW COMPARISON:  06/03/2024 FINDINGS: Lungs are hypoinflated demonstrate a moderate to large right pleural effusion with improvement post thoracentesis. Better aeration over the right upper lobe/apex. Likely compressive atelectasis over the right base. No pneumothorax. Left lung is clear. Cardiomediastinal silhouette and remainder of the exam is unchanged. IMPRESSION: Moderate to large right pleural effusion with improvement post thoracentesis. No pneumothorax. Electronically Signed   By: Toribio Agreste M.D.   On: 06/05/2024 09:12   Scheduled Meds:  allopurinol   150 mg Oral Daily   aspirin  EC  81 mg Oral Daily   azithromycin   500 mg Oral Daily   Chlorhexidine  Gluconate Cloth  6 each Topical Q0600   enoxaparin  (LOVENOX ) injection  40 mg Subcutaneous Q24H   feeding supplement  237 mL Oral BID BM   fluticasone   2 spray Each Nare Daily   furosemide   40 mg Intravenous BID    insulin  aspart  0-5 Units Subcutaneous QHS   insulin  aspart  0-9 Units Subcutaneous TID WC   loratadine   10 mg Oral Daily   multivitamin  1 tablet Oral BID   mupirocin  ointment  1 Application Nasal BID   pantoprazole   40 mg Oral Daily   rosuvastatin   10 mg Oral QODAY   Continuous Infusions:  cefTRIAXone  (ROCEPHIN )  IV Stopped (06/05/24 0816)     LOS: 2 days   Rendall Carwin, MD [facility]  If 7PM-7AM, please contact night-coverage [email or web address] 06/05/2024, 7:08 PM   "

## 2024-06-05 NOTE — Procedures (Signed)
 Ultrasound-guided therapeutic right sided thoracentesis performed yielding 2.9 liters of serous colored fluid. No immediate complications. is. Follow-up chest x-ray pending. EBL is < 2 ml. Patient unable to tolerate additional fluid removal at this time.

## 2024-06-06 DIAGNOSIS — E43 Unspecified severe protein-calorie malnutrition: Secondary | ICD-10-CM | POA: Insufficient documentation

## 2024-06-06 DIAGNOSIS — J9601 Acute respiratory failure with hypoxia: Secondary | ICD-10-CM | POA: Diagnosis not present

## 2024-06-06 LAB — GLUCOSE, CAPILLARY
Glucose-Capillary: 98 mg/dL (ref 70–99)
Glucose-Capillary: 184 mg/dL — ABNORMAL HIGH (ref 70–99)
Glucose-Capillary: 109 mg/dL — ABNORMAL HIGH (ref 70–99)
Glucose-Capillary: 182 mg/dL — ABNORMAL HIGH (ref 70–99)
Glucose-Capillary: 185 mg/dL — ABNORMAL HIGH (ref 70–99)
Glucose-Capillary: 127 mg/dL — ABNORMAL HIGH (ref 70–99)

## 2024-06-06 LAB — SURGICAL PATHOLOGY

## 2024-06-06 NOTE — Progress Notes (Signed)
 Physical Therapy Treatment Patient Details Name: Cody Palmer MRN: [id number] DOB: 24-Sep-1932 Today's Date: 06/06/2024   History of Present Illness Cody Palmer is a 89 y.o. male with medical history significant for CKD stage IIIa, hypertension, dyslipidemia, type 2 diabetes, and non-Hodgkin's lymphoma who presented to the ED with worsening shortness of breath over the last few days.  He would routinely get paracentesis done, but has not required 1 in the last 3 weeks.  He has noticed some leg swelling as well as some mild abdominal distention and has increased cough and fatigue.  He normally does not wear oxygen at home and denies any other symptoms of fevers, chills, abdominal pain, chest pain, or sick contacts.    PT Comments  Patient demonstrates fair/good return for sitting up at bedside, able to transfer to/from commode in bathroom using RW and demonstrated increased endurance/distance for gait training without loss of balance. Patient tolerated sitting up in chair after therapy. Patient will benefit from continued skilled physical therapy in hospital and recommended venue below to increase strength, balance, endurance for safe ADLs and gait.      If plan is discharge home, recommend the following: A little help with walking and/or transfers;A little help with bathing/dressing/bathroom;Help with stairs or ramp for entrance;Assist for transportation;Assistance with cooking/housework   Can travel by private vehicle     Yes  Equipment Recommendations  None recommended by PT    Recommendations for Other Services       Precautions / Restrictions Precautions Precautions: Fall Recall of Precautions/Restrictions: Intact Restrictions Weight Bearing Restrictions Per Provider Order: No     Mobility  Bed Mobility Overal bed mobility: Needs Assistance Bed Mobility: Supine to Sit     Supine to sit: Contact guard Sit to supine: HOB elevated   General bed mobility comments:  slightly labored movement for sitting up at bedside with [facility] raised    Transfers Overall transfer level: Needs assistance Equipment used: Rolling walker (2 wheels) Transfers: Sit to/from Stand, Bed to chair/wheelchair/BSC Sit to Stand: Supervision, Contact guard assist   Step pivot transfers: Contact guard assist       General transfer comment: fair/good return for transferring to/from commode in bathrom, chair at bedside    Ambulation/Gait Ambulation/Gait assistance: Contact guard assist Gait Distance (Feet): 75 Feet Assistive device: Rolling walker (2 wheels) Gait Pattern/deviations: Decreased step length - left, Decreased stance time - right, Decreased stride length Gait velocity: decreased     General Gait Details: slightly labored movement wiht good return for ambulating in room, hallway without loss of balance, on room air with SpO2 dropping from 94% to 87%   Stairs             Wheelchair Mobility     Tilt Bed    Modified Rankin (Stroke Patients Only)       Balance Overall balance assessment: Needs assistance Sitting-balance support: Feet supported, No upper extremity supported Sitting balance-Leahy Scale: Good Sitting balance - Comments: seated at EOB   Standing balance support: During functional activity, Bilateral upper extremity supported Standing balance-Leahy Scale: Fair Standing balance comment: fair/good using RW                            Communication Communication Communication: Impaired;No apparent difficulties  Cognition Arousal: Alert Behavior During Therapy: [facility] for tasks assessed/performed  Following commands: Intact      Cueing Cueing Techniques: Verbal cues, Tactile cues  Exercises General Exercises - Lower Extremity Long Arc Quad: Seated, AROM, Strengthening, 10 reps, Both Hip Flexion/Marching: Seated, AROM, Strengthening, Both, 10 reps Toe Raises: Seated, AROM,  Strengthening, Both, 10 reps Heel Raises: Seated, AROM, Strengthening, Both, 10 reps    General Comments        Pertinent Vitals/Pain Pain Assessment Pain Assessment: No/denies pain    Home Living                          Prior Function            PT Goals (current goals can now be found in the care plan section) Acute Rehab PT Goals Patient Stated Goal: return home with family to assist PT Goal Formulation: With patient Time For Goal Achievement: 06/18/24 Potential to Achieve Goals: Good Progress towards PT goals: Progressing toward goals    Frequency    Min 3X/week      PT Plan      Co-evaluation              AM-PAC PT 6 Clicks Mobility   Outcome Measure  Help needed turning from your back to your side while in a flat bed without using bedrails?: None Help needed moving from lying on your back to sitting on the side of a flat bed without using bedrails?: A Little Help needed moving to and from a bed to a chair (including a wheelchair)?: A Little Help needed standing up from a chair using your arms (e.g., wheelchair or bedside chair)?: A Little Help needed to walk in hospital room?: A Little Help needed climbing 3-5 steps with a railing? : A Lot 6 Click Score: 18    End of Session   Activity Tolerance: Patient tolerated treatment well;Patient limited by fatigue Patient left: in chair;with call bell/phone within reach Nurse Communication: Mobility status PT Visit Diagnosis: Other abnormalities of gait and mobility (R26.89);Difficulty in walking, not elsewhere classified (R26.2);Muscle weakness (generalized) (M62.81)     Time: 9082-9057 PT Time Calculation (min) (ACUTE ONLY): 25 min  Charges:    $Gait Training: 8-22 mins $Therapeutic Exercise: 8-22 mins PT General Charges $$ ACUTE PT VISIT: 1 Visit                     {2:46 PM, 06/06/24 Lynwood Music, MPT Physical Therapist with [facility] 336 [phone]  office [phone] mobile phone

## 2024-06-06 NOTE — Progress Notes (Signed)
 " PROGRESS NOTE    Cody Palmer  FMW:[id number] DOB: [birth year] DOA: 06/02/2024 PCP: [name], NP   Brief Narrative:    Cody Palmer is a 89 y.o. male with medical history significant for CKD stage IIIa, hypertension, dyslipidemia, type 2 diabetes, and non-Hodgkin's lymphoma who presented to the ED with worsening shortness of breath over the last few days.  He would routinely get paracentesis done, but has not required 1 in the last 3 weeks.  Patient was admitted with acute hypoxemic respiratory failure secondary to large pleural effusion along with atelectasis as well as sepsis, POA secondary to pneumonia.  Patient underwent thoracentesis on 1/19 with 1.5 L of fluid removal which was the maximum amount that could be removed.  He has apparently another 1-2 L of fluid that needs to be pulled off and this can be done on 1/21.  Anticipate discharge following this if clinically doing well.  Assessment & Plan:   Principal Problem:   Acute hypoxemic respiratory failure (HCC) Active Problems:   Essential hypertension   Abdominal distention   DNR (do not resuscitate)   Follicular lymphoma (HCC)   CKD stage 3 due to type 2 diabetes mellitus (HCC)   Protein-calorie malnutrition, severe  Assessment and Plan:   1)Acute hypoxemic respiratory failure secondary to large right pleural effusion with atelectasis --Etiology of rather large right-sided pleural effusion could be lymphoma versus pneumonia versus heart failure -Patient had Rt Thora on 06/03/2024 with 1.5 L removed and again Thora on the right side on 06/05/2024 with 2.9 L removed --Repeat x-ray continues to show significant amount of pleural effusion on the right -Weaned down to 2 L of oxygen at this time -Discussed with patient and family members that he may be a candidate for PleurX catheter --They want to proceed with Pleurax cath -Get LDH of pleural fluid and  serum LDH with next Thora to determine exudate versus  transudate--suspect this is an exudate most likely due to lymphoma - Continue IV Lasix  for diuresis 40 mg twice daily --- IR consult for thoracentesis and PleurX catheter placement requested   2)Sepsis, POA secondary to pneumonia -Chest imaging with mostly right-sided pleural effusion unable to exclude possible underlying pneumonia at this time - Continue  azithromycin  and Rocephin  - Monitor CBC and lactic acid - Gram stain with no organisms and culture with no growth from thoracentesis on 1/19, repeat thoracentesis on 06/05/24 noted   3)Non-Hodgkin lymphoma -PET scan results from 05/30/2021 showed significant improvement - Follows with Dr. Davonna    4)Recurring ascites likely secondary to lymphoma - Small to moderate on abdominal imaging with no tense abdomen or signs of SBP - Hold on paracentesis for now   CKD stage IIIb - Current creatinine near baseline -Continue to monitor   Hypertension - Currently stable, monitor with IV diuresis and hold other antihypertensives   Type 2 diabetes-with mild hyperglycemia - Carb modified diet and SSI  Sinus tachycardia - Likely ongoing in the setting of pleural effusion that needs further drainage.  Thoracentesis ordered for 06/05/24  Generalized Weakness/ Ambulatory dysfunction--- PT eval appreciated recommends SNF rehab   DVT prophylaxis:Lovenox  Code Status: DNR Family Communication: Discussed with daughter Cody Palmer at bedside Disposition Plan: SNF rehab .  Consultants:  None  Procedures:  Right sided thoracentesis 1/19  Antimicrobials:  Anti-infectives (From admission, onward)    Start     Dose/Rate Route Frequency Ordered Stop   06/03/24 1000  cefTRIAXone  (ROCEPHIN ) 2 g in sodium chloride  0.9 %  100 mL IVPB        2 g 200 mL/hr over 30 Minutes Intravenous Every 24 hours 06/02/24 1145 06/09/24 0959   06/03/24 1000  azithromycin  (ZITHROMAX ) tablet 500 mg        500 mg Oral Daily 06/02/24 1145 06/06/24 0836   06/02/24 1100   cefTRIAXone  (ROCEPHIN ) 2 g in sodium chloride  0.9 % 100 mL IVPB        2 g 200 mL/hr over 30 Minutes Intravenous  Once 06/02/24 1058 06/02/24 1143   06/02/24 1100  azithromycin  (ZITHROMAX ) tablet 500 mg        500 mg Oral  Once 06/02/24 1058 06/02/24 1110       Subjective: -Patient's daughter Cody Palmer at bedside - -Ambulated with mobility specialist earlier today No fever  Or chills  =--No chest pains, dyspnea improving slowly  Objective: Vitals:   06/06/24 0006 06/06/24 0408 06/06/24 1000 06/06/24 1322  BP: 128/65 (!) 100/47 119/68 132/60  Pulse: (!) 101 100  (!) 105  Resp: 19 18 18 17   Temp: 98 F (36.7 C) 98.3 F (36.8 C) 97.6 F (36.4 C) 97.9 F (36.6 C)  TempSrc: Oral Oral Oral   SpO2: 96% 95% 94% 95%  Weight:  75.2 kg    Height:        Intake/Output Summary (Last 24 hours) at 06/06/2024 1946 Last data filed at 06/06/2024 1806 Gross per 24 hour  Intake 300 ml  Output 1700 ml  Net -1400 ml   Filed Weights   06/04/24 0500 06/05/24 0500 06/06/24 0408  Weight: 76.5 kg 77.9 kg 75.2 kg    Physical Exam Gen:- Awake Alert, dyspnea on exertion patient  HEENT:- [place].AT, No sclera icterus Nose- [place] 2L/min Neck-Supple Neck,No JVD,.  Lungs-diminished breath sounds on the right, no wheezing  CV- S1, S2 normal, RRR Abd-  +ve B.Sounds, Abd Soft, No tenderness,    Extremity/Skin:- No  edema,   good pedal pulses  Psych-affect is appropriate, oriented x3 Neuro-generalized weakness, no new focal deficits, no tremors  Data Reviewed: I have personally reviewed following labs and imaging studies  CBC: Recent Labs  Lab 06/02/24 0842 06/03/24 0422 06/04/24 0452 06/05/24 0422  WBC 19.2* 14.7* 13.3* 13.2*  NEUTROABS 14.1*  --   --   --   HGB 12.4* 11.6* 11.2* 10.7*  HCT 40.6 38.1* 36.7* 35.1*  MCV 89.0 89.4 88.4 88.6  PLT 345 318 284 274   Basic Metabolic Panel: Recent Labs  Lab 06/02/24 0842 06/03/24 0422 06/04/24 0452 06/05/24 0422  NA 143 142 141 139  K 3.7 4.1 3.7  3.3*  CL 101 100 100 98  CO2 25 31 29  33*  GLUCOSE 154* 115* 113* 113*  BUN 26* 27* 25* 24*  CREATININE 0.99 1.10 0.95 1.06  CALCIUM  8.6* 8.5* 8.3* 8.1*  MG  --  1.9 1.9 1.9   GFR: Estimated Creatinine Clearance: 47.2 mL/min (by C-G formula based on SCr of 1.06 mg/dL). Liver Function Tests: Recent Labs  Lab 06/02/24 0842 06/03/24 0422 06/04/24 0452  AST 21 19 21   ALT 14 14 10   ALKPHOS 139* 122 114  BILITOT 0.8 0.5 0.5  PROT 6.1* 5.4* 5.1*  ALBUMIN  3.5 3.2* 2.9*   Coagulation Profile: Recent Labs  Lab 06/02/24 0842  INR 1.1   BNP (last 3 results) Recent Labs    06/02/24 0911  PROBNP 933.0*   CBG: Recent Labs  Lab 06/05/24 1632 06/05/24 2208 06/06/24 0732 06/06/24 1123 06/06/24 1620  GLUCAP 98  184* 109* 182* 185*   Sepsis Labs: Recent Labs  Lab 06/02/24 0842 06/02/24 1010 06/03/24 0422 06/05/24 0422  LATICACIDVEN 3.9* 2.5* 2.4* 1.3    Recent Results (from the past 240 hours)  Culture, blood (routine x 2)     Status: None (Preliminary result)   Collection Time: 06/02/24  8:51 AM   Specimen: BLOOD  Result Value Ref Range Status   Specimen Description BLOOD BLOOD LEFT FOREARM  Final   Special Requests   Final    BOTTLES DRAWN AEROBIC AND ANAEROBIC Blood Culture adequate volume   Culture   Final    NO GROWTH 4 DAYS Performed at [facility], [address] [place], [place] [address]    Report Status PENDING  Incomplete  Resp panel by RT-PCR (RSV, Flu A&B, Covid) Anterior Nasal Swab     Status: None   Collection Time: 06/02/24  9:06 AM   Specimen: Anterior Nasal Swab  Result Value Ref Range Status   SARS Coronavirus 2 by RT PCR NEGATIVE NEGATIVE Final    Comment: (NOTE) SARS-CoV-2 target nucleic acids are NOT DETECTED.  The SARS-CoV-2 RNA is generally detectable in upper respiratory specimens during the acute phase of infection. The lowest concentration of SARS-CoV-2 viral copies this assay can detect is 138 copies/mL. A negative result does  not preclude SARS-Cov-2 infection and should not be used as the sole basis for treatment or other patient management decisions. A negative result may occur with  improper specimen collection/handling, submission of specimen other than nasopharyngeal swab, presence of viral mutation(s) within the areas targeted by this assay, and inadequate number of viral copies(<138 copies/mL). A negative result must be combined with clinical observations, patient history, and epidemiological information. The expected result is Negative.  Fact Sheet for Patients:  bloggercourse.com  Fact Sheet for Healthcare Providers:  seriousbroker.it  This test is no t yet approved or cleared by the [place]  FDA and  has been authorized for detection and/or diagnosis of SARS-CoV-2 by FDA under an Emergency Use Authorization (EUA). This EUA will remain  in effect (meaning this test can be used) for the duration of the COVID-19 declaration under Section 564(b)(1) of the Act, 21 U.S.C.section 360bbb-3(b)(1), unless the authorization is terminated  or revoked sooner.       Influenza A by PCR NEGATIVE NEGATIVE Final   Influenza B by PCR NEGATIVE NEGATIVE Final    Comment: (NOTE) The Xpert Xpress SARS-CoV-2/FLU/RSV plus assay is intended as an aid in the diagnosis of influenza from Nasopharyngeal swab specimens and should not be used as a sole basis for treatment. Nasal washings and aspirates are unacceptable for Xpert Xpress SARS-CoV-2/FLU/RSV testing.  Fact Sheet for Patients: bloggercourse.com  Fact Sheet for Healthcare Providers: seriousbroker.it  This test is not yet approved or cleared by the [place]  FDA and has been authorized for detection and/or diagnosis of SARS-CoV-2 by FDA under an Emergency Use Authorization (EUA). This EUA will remain in effect (meaning this test can be used) for the  duration of the COVID-19 declaration under Section 564(b)(1) of the Act, 21 U.S.C. section 360bbb-3(b)(1), unless the authorization is terminated or revoked.     Resp Syncytial Virus by PCR NEGATIVE NEGATIVE Final    Comment: (NOTE) Fact Sheet for Patients: bloggercourse.com  Fact Sheet for Healthcare Providers: seriousbroker.it  This test is not yet approved or cleared by the [place]  FDA and has been authorized for detection and/or diagnosis of SARS-CoV-2 by FDA under an Emergency Use Authorization (EUA). This  EUA will remain in effect (meaning this test can be used) for the duration of the COVID-19 declaration under Section 564(b)(1) of the Act, 21 U.S.C. section 360bbb-3(b)(1), unless the authorization is terminated or revoked.  Performed at [facility], [address] [place], [place] [address]   Culture, blood (routine x 2)     Status: None (Preliminary result)   Collection Time: 06/02/24  9:11 AM   Specimen: BLOOD  Result Value Ref Range Status   Specimen Description BLOOD LEFT ANTECUBITAL  Final   Special Requests   Final    BOTTLES DRAWN AEROBIC AND ANAEROBIC Blood Culture adequate volume   Culture   Final    NO GROWTH 4 DAYS Performed at [facility], [address], [place], [place] [address]    Report Status PENDING  Incomplete  Urine Culture     Status: Abnormal   Collection Time: 06/02/24 11:27 AM   Specimen: Urine, Random  Result Value Ref Range Status   Specimen Description   Final    URINE, RANDOM Performed at  [facility], [address], [place], [place] [address]    Special Requests   Final    URINE, CLEAN CATCH Performed at [facility], [address], [place], [place] [address]    Culture 10,000 COLONIES/mL ESCHERICHIA COLI (A)  Final   Report Status 06/04/2024 FINAL  Final   Organism ID, Bacteria ESCHERICHIA COLI (A)  Final      Susceptibility   Escherichia coli - MIC*    AMPICILLIN  <=2 SENSITIVE Sensitive     CEFAZOLIN  (URINE) Value in next row Sensitive      <=1 SENSITIVEThis is a modified FDA-approved test that has been validated and its performance characteristics determined by the reporting laboratory.  This laboratory is certified under the Clinical Laboratory Improvement Amendments CLIA as qualified to perform high complexity clinical laboratory testing.    CEFEPIME Value in next row Sensitive      <=1 SENSITIVEThis is a modified FDA-approved test that has been validated and its performance characteristics determined by the reporting laboratory.  This laboratory is certified under the Clinical Laboratory Improvement Amendments CLIA as qualified to perform high complexity clinical laboratory testing.    ERTAPENEM Value in next row Sensitive      <=1 SENSITIVEThis is a modified FDA-approved test that has been validated and its performance characteristics determined by the reporting laboratory.  This laboratory is certified under the Clinical Laboratory Improvement Amendments CLIA as qualified to perform high complexity clinical laboratory testing.    CEFTRIAXONE  Value in next row Sensitive      <=1 SENSITIVEThis is a modified FDA-approved test that has been validated and its performance characteristics determined by the reporting laboratory.  This laboratory is certified under the Clinical Laboratory Improvement Amendments CLIA as qualified to perform high complexity clinical laboratory testing.    CIPROFLOXACIN  Value in next row Sensitive      <=1 SENSITIVEThis is a modified FDA-approved test that has been validated and its performance characteristics determined by the reporting laboratory.  This laboratory is certified under the Clinical Laboratory Improvement Amendments CLIA as qualified to perform high complexity clinical laboratory testing.    GENTAMICIN Value in next row Sensitive      <=1 SENSITIVEThis is a modified FDA-approved test that has been validated and its  performance characteristics determined by the reporting laboratory.  This laboratory is certified under the Clinical Laboratory Improvement Amendments CLIA as qualified to perform high complexity clinical laboratory testing.  NITROFURANTOIN Value in next row Sensitive      <=1 SENSITIVEThis is a modified FDA-approved test that has been validated and its performance characteristics determined by the reporting laboratory.  This laboratory is certified under the Clinical Laboratory Improvement Amendments CLIA as qualified to perform high complexity clinical laboratory testing.    TRIMETH/SULFA Value in next row Sensitive      <=1 SENSITIVEThis is a modified FDA-approved test that has been validated and its performance characteristics determined by the reporting laboratory.  This laboratory is certified under the Clinical Laboratory Improvement Amendments CLIA as qualified to perform high complexity clinical laboratory testing.    AMPICILLIN/SULBACTAM Value in next row Sensitive      <=1 SENSITIVEThis is a modified FDA-approved test that has been validated and its performance characteristics determined by the reporting laboratory.  This laboratory is certified under the Clinical Laboratory Improvement Amendments CLIA as qualified to perform high complexity clinical laboratory testing.    PIP/TAZO Value in next row Sensitive      <=4 SENSITIVEThis is a modified FDA-approved test that has been validated and its performance characteristics determined by the reporting laboratory.  This laboratory is certified under the Clinical Laboratory Improvement Amendments CLIA as qualified to perform high complexity clinical laboratory testing.    MEROPENEM Value in next row Sensitive      <=4 SENSITIVEThis is a modified FDA-approved test that has been validated and its performance characteristics determined by the reporting laboratory.  This laboratory is certified under the Clinical Laboratory Improvement Amendments CLIA  as qualified to perform high complexity clinical laboratory testing.    * 10,000 COLONIES/mL ESCHERICHIA COLI  MRSA Next Gen by PCR, Nasal     Status: Abnormal   Collection Time: 06/02/24 12:12 PM   Specimen: Nasal Mucosa; Nasal Swab  Result Value Ref Range Status   MRSA by PCR Next Gen DETECTED (A) NOT DETECTED Final    Comment: RESULT CALLED TO, READ BACK BY AND VERIFIED WITH: A RAY AT 1724 ON [id number] BY S DALTON (NOTE) The GeneXpert MRSA Assay (FDA approved for NASAL specimens only), is one component of a comprehensive MRSA colonization surveillance program. It is not intended to diagnose MRSA infection nor to guide or monitor treatment for MRSA infections. Test performance is not FDA approved in patients less than 54 years old. Performed at [facility], [address] [place], [place] [address]   Culture, body fluid w Gram Stain-bottle     Status: None (Preliminary result)   Collection Time: 06/03/24  2:10 PM   Specimen: Pleura  Result Value Ref Range Status   Specimen Description PLEURAL BOTTLES DRAWN AEROBIC AND ANAEROBIC  Final   Special Requests 10CC  Final   Culture   Final    NO GROWTH 3 DAYS Performed at [facility], [address] [place], [place] [address]    Report Status PENDING  Incomplete  Gram stain     Status: None   Collection Time: 06/03/24  2:10 PM   Specimen: Pleura  Result Value Ref Range Status   Specimen Description PLEURAL  Final   Special Requests NONE  Final   Gram Stain   Final    NO ORGANISMS SEEN WBC PRESENT,BOTH PMN AND MONONUCLEAR CYTOSPIN SMEAR Performed at [facility], [address], [place], [place] [address]    Report Status 06/03/2024 FINAL  Final    Radiology Studies: US  THORACENTESIS RIGHT ASP PLEURAL SPACE W/IMG GUIDE Result Date: 06/05/2024 INDICATION: 89 year old male. History of lymphoma with recurrent  right-sided pleural effusion. Request is for therapeutic right-sided thoracentesis. EXAM: ULTRASOUND GUIDED THERAPEUTIC  RIGHT-SIDED THORACENTESIS MEDICATIONS: Lidocaine  1% 10 mL COMPLICATIONS: None immediate. PROCEDURE: An ultrasound guided thoracentesis was thoroughly discussed with the patient and questions answered. The benefits, risks, alternatives and complications were also discussed. The patient understands and wishes to proceed with the procedure. Written consent was obtained. Ultrasound was performed to localize and mark an adequate pocket of fluid in the right chest. The area was then prepped and draped in the normal sterile fashion. 1% Lidocaine  was used for local anesthesia. Under ultrasound guidance a 19 gauge, 7-cm, Yueh catheter was introduced. Thoracentesis was performed. The catheter was removed and a dressing applied. FINDINGS: A total of approximately 2.9 L of serous colored fluid was removed. IMPRESSION: Successful ultrasound guided therapeutic right-sided thoracentesis yielding 2.9 of serous colored pleural fluid. Performed by Delon Beagle NP Electronically Signed   By: Ester Sides M.D.   On: 06/05/2024 09:12   DG Chest Portable 1 View Result Date: 06/05/2024 CLINICAL DATA:  Post right-sided thoracentesis. EXAM: PORTABLE CHEST 1 VIEW COMPARISON:  06/03/2024 FINDINGS: Lungs are hypoinflated demonstrate a moderate to large right pleural effusion with improvement post thoracentesis. Better aeration over the right upper lobe/apex. Likely compressive atelectasis over the right base. No pneumothorax. Left lung is clear. Cardiomediastinal silhouette and remainder of the exam is unchanged. IMPRESSION: Moderate to large right pleural effusion with improvement post thoracentesis. No pneumothorax. Electronically Signed   By: Toribio Agreste M.D.   On: 06/05/2024 09:12   Scheduled Meds:  allopurinol   150 mg Oral Daily   aspirin  EC  81 mg Oral Daily   Chlorhexidine  Gluconate Cloth  6 each Topical Q0600   enoxaparin  (LOVENOX ) injection  40 mg Subcutaneous Q24H   feeding supplement  237 mL Oral BID BM    fluticasone   2 spray Each Nare Daily   furosemide   40 mg Intravenous BID   insulin  aspart  0-5 Units Subcutaneous QHS   insulin  aspart  0-9 Units Subcutaneous TID WC   loratadine   10 mg Oral Daily   multivitamin  1 tablet Oral BID   mupirocin  ointment  1 Application Nasal BID   pantoprazole   40 mg Oral Daily   rosuvastatin   10 mg Oral QODAY   Continuous Infusions:  cefTRIAXone  (ROCEPHIN )  IV 2 g (06/06/24 0846)     LOS: 3 days   Rendall Carwin, MD [facility]  If 7PM-7AM, please contact night-coverage [email or web address] 06/06/2024, 7:46 PM   "

## 2024-06-06 NOTE — Progress Notes (Signed)
 Initial Nutrition Assessment  DOCUMENTATION CODES:  Severe malnutrition in context of chronic illness  INTERVENTION:  Ensure Plus High Protein po BID, each supplement provides 350 kcal and 20 grams of protein  Magic cup TID with meals, each supplement provides 290 kcal and 9 grams of protein  NUTRITION DIAGNOSIS:  Severe Malnutrition related to chronic illness (follicular lymphoma) as evidenced by severe muscle depletion, severe fat depletion.  GOAL:  Patient will meet greater than or equal to 90% of their needs  MONITOR:  PO intake, Supplement acceptance  REASON FOR ASSESSMENT:  Malnutrition Screening Tool    ASSESSMENT:  89 yo male admitted with acute respiratory failure d/t large R pleural effusion with atelectasis. PMH includes follicular lymphoma, CKD stage 3, DM-2, HTN, HLD.  1/19: S/P thoracentesis with 1.5 L fluid removed  Patient reports decreased appetite and poor intake PTA. Since admission his appetite has improved. He feels a lot better since the fluid was removed by thoracentesis. He likes PO supplements and agreed to drink 2 per day. He also agreed to receive magic cups with meals.  Usual weight: 93.4 kg (04/10/24) Admit / Current weight: 75.2 kg  19% weight loss within 2 months is severe. Some weight loss is related to fluid removal with paracentesis, however, weight continues to trend downward.   Average Meal Intake: 1/22: 100% of breakfast and lunch  Nutritionally Relevant Medications: Scheduled Meds:  allopurinol   150 mg Oral Daily   aspirin  EC  81 mg Oral Daily   Chlorhexidine  Gluconate Cloth  6 each Topical Q0600   enoxaparin  (LOVENOX ) injection  40 mg Subcutaneous Q24H   feeding supplement  237 mL Oral BID BM   fluticasone   2 spray Each Nare Daily   furosemide   40 mg Intravenous BID   insulin  aspart  0-5 Units Subcutaneous QHS   insulin  aspart  0-9 Units Subcutaneous TID WC   loratadine   10 mg Oral Daily   multivitamin  1 tablet Oral BID    mupirocin  ointment  1 Application Nasal BID   pantoprazole   40 mg Oral Daily   rosuvastatin   10 mg Oral QODAY   Continuous Infusions:  cefTRIAXone  (ROCEPHIN )  IV 2 g (06/06/24 0846)   PRN Meds:.acetaminophen  **OR** acetaminophen , ipratropium-albuterol , labetalol , melatonin, ondansetron  **OR** ondansetron  (ZOFRAN ) IV, polyethylene glycol, simethicone   Labs Reviewed: K 3.3 CBG ranges from 109-182 mg/dL over the last 24 hours HgbA1c 6.6 (04/04/24)  NUTRITION - FOCUSED PHYSICAL EXAM: Flowsheet Row Most Recent Value  Orbital Region Severe depletion  Upper Arm Region Moderate depletion  Thoracic and Lumbar Region Moderate depletion  Buccal Region Severe depletion  Temple Region Moderate depletion  Clavicle Bone Region Severe depletion  Clavicle and Acromion Bone Region Severe depletion  Scapular Bone Region Moderate depletion  Dorsal Hand Moderate depletion  Patellar Region Moderate depletion  Anterior Thigh Region Moderate depletion  Posterior Calf Region Severe depletion  Edema (RD Assessment) None  Hair Reviewed  Eyes Reviewed  Mouth Reviewed  Skin Reviewed  Nails Reviewed    Diet Order:   Diet Order             Diet heart healthy/carb modified Fluid consistency: Thin; Fluid restriction: 1500 mL Fluid  Diet effective now                   EDUCATION NEEDS:  Education needs have been addressed  Skin:  Skin Assessment: Reviewed RN Assessment  Last BM:  1/21  Height:  Ht Readings from Last 1 Encounters:  06/02/24  5' 9 (1.753 m)   Weight:  Wt Readings from Last 1 Encounters:  06/06/24 75.2 kg   Ideal Body Weight:  72.7 kg  BMI:  Body mass index is 24.48 kg/m.  Estimated Nutritional Needs:  Kcal:  2100-2300 Protein:  105-120 gm Fluid:  2.1-2.3 L   Suzen HUNT RD, LDN, CNSC Contact via secure chat. If unavailable, use group chat RD Inpatient.

## 2024-06-06 NOTE — Progress Notes (Signed)
 Mobility Specialist Progress Note:    06/06/24 1050  Mobility  Activity Pivoted/transferred from chair to bed  Level of Assistance Contact guard assist, steadying assist  Assistive Device None  Distance Ambulated (ft) 3 ft  Range of Motion/Exercises Active;All extremities  Activity Response Tolerated well  Mobility Referral Yes  Mobility visit 1 Mobility  Mobility Specialist Start Time (ACUTE ONLY) 1050  Mobility Specialist Stop Time (ACUTE ONLY) 1105  Mobility Specialist Time Calculation (min) (ACUTE ONLY) 15 min   Pt received in chair, requesting assistance to bed. Required CGA to stand and transfer with no AD. Tolerated well, asx throughout. Alarm on, all needs met.  [name] Mobility Specialist Please contact via Special Educational Needs Teacher or  Rehab office at [phone]

## 2024-06-07 ENCOUNTER — Encounter (HOSPITAL_COMMUNITY): Payer: Self-pay | Admitting: Internal Medicine

## 2024-06-07 ENCOUNTER — Other Ambulatory Visit (HOSPITAL_COMMUNITY): Payer: Self-pay | Admitting: Radiology

## 2024-06-07 ENCOUNTER — Inpatient Hospital Stay (HOSPITAL_COMMUNITY)

## 2024-06-07 DIAGNOSIS — J9601 Acute respiratory failure with hypoxia: Secondary | ICD-10-CM | POA: Diagnosis not present

## 2024-06-07 LAB — RENAL FUNCTION PANEL
Albumin: 2.6 g/dL — ABNORMAL LOW (ref 3.5–5.0)
Anion gap: 7 (ref 5–15)
BUN: 20 mg/dL (ref 8–23)
CO2: 33 mmol/L — ABNORMAL HIGH (ref 22–32)
Calcium: 7.9 mg/dL — ABNORMAL LOW (ref 8.9–10.3)
Chloride: 99 mmol/L (ref 98–111)
Creatinine, Ser: 0.91 mg/dL (ref 0.61–1.24)
GFR, Estimated: >60 mL/min
Glucose, Bld: 108 mg/dL — ABNORMAL HIGH (ref 70–99)
Phosphorus: 2.8 mg/dL (ref 2.5–4.6)
Potassium: 3.5 mmol/L (ref 3.5–5.1)
Sodium: 139 mmol/L (ref 135–145)

## 2024-06-07 LAB — CBC
HCT: 32.6 % — ABNORMAL LOW (ref 39.0–52.0)
Hemoglobin: 10 g/dL — ABNORMAL LOW (ref 13.0–17.0)
MCH: 26.9 pg (ref 26.0–34.0)
MCHC: 30.7 g/dL (ref 30.0–36.0)
MCV: 87.6 fL (ref 80.0–100.0)
Platelets: 264 K/uL (ref 150–400)
RBC: 3.72 MIL/uL — ABNORMAL LOW (ref 4.22–5.81)
RDW: 17.1 % — ABNORMAL HIGH (ref 11.5–15.5)
WBC: 10.4 K/uL (ref 4.0–10.5)
nRBC: 0 % (ref 0.0–0.2)

## 2024-06-07 LAB — HEPATIC FUNCTION PANEL
ALT: 11 U/L (ref 0–44)
AST: 19 U/L (ref 15–41)
Albumin: 2.7 g/dL — ABNORMAL LOW (ref 3.5–5.0)
Alkaline Phosphatase: 93 U/L (ref 38–126)
Bilirubin, Direct: 0.2 mg/dL (ref 0.0–0.2)
Indirect Bilirubin: 0.1 mg/dL — ABNORMAL LOW (ref 0.3–0.9)
Total Bilirubin: 0.3 mg/dL (ref 0.0–1.2)
Total Protein: 4.9 g/dL — ABNORMAL LOW (ref 6.5–8.1)

## 2024-06-07 LAB — FLOW CYTOMETRY REQUEST - FLUID (INPATIENT)

## 2024-06-07 LAB — GRAM STAIN

## 2024-06-07 LAB — CULTURE, BLOOD (ROUTINE X 2)
Culture: NO GROWTH
Culture: NO GROWTH
Special Requests: ADEQUATE
Special Requests: ADEQUATE

## 2024-06-07 LAB — LACTATE DEHYDROGENASE: LDH: 496 U/L — ABNORMAL HIGH (ref 105–235)

## 2024-06-07 LAB — GLUCOSE, CAPILLARY
Glucose-Capillary: 118 mg/dL — ABNORMAL HIGH (ref 70–99)
Glucose-Capillary: 184 mg/dL — ABNORMAL HIGH (ref 70–99)

## 2024-06-07 MED ORDER — METOPROLOL SUCCINATE ER 25 MG PO TB24: 25.0000 mg | ORAL_TABLET | Freq: Every day | ORAL | 2 refills | Status: AC

## 2024-06-07 MED ORDER — METFORMIN HCL ER (OSM) 500 MG PO TB24: 500.0000 mg | ORAL_TABLET | Freq: Two times a day (BID) | ORAL | 1 refills | Status: AC

## 2024-06-07 MED ORDER — ASPIRIN 81 MG PO TBEC: 81.0000 mg | DELAYED_RELEASE_TABLET | Freq: Every day | ORAL | 12 refills | Status: AC

## 2024-06-07 MED ORDER — FUROSEMIDE 40 MG PO TABS: 40.0000 mg | ORAL_TABLET | Freq: Every morning | ORAL | 2 refills | Status: AC

## 2024-06-07 MED ORDER — LIDOCAINE HCL (PF) 2 % IJ SOLN
10.0000 mL | Freq: Once | INTRAMUSCULAR | Status: AC
  Administered 2024-06-07: 10 mL via INTRADERMAL

## 2024-06-07 MED ORDER — OMEPRAZOLE 40 MG PO CPDR: 40.0000 mg | DELAYED_RELEASE_CAPSULE | Freq: Every day | ORAL | 2 refills | Status: AC

## 2024-06-07 MED ORDER — ACETAMINOPHEN 325 MG PO TABS: 650.0000 mg | ORAL_TABLET | Freq: Four times a day (QID) | ORAL | Status: AC | PRN

## 2024-06-07 MED ORDER — AZITHROMYCIN 500 MG PO TABS: 500.0000 mg | ORAL_TABLET | Freq: Every day | ORAL | 0 refills | Status: AC

## 2024-06-07 MED ORDER — LIDOCAINE HCL (PF) 2 % IJ SOLN
INTRAMUSCULAR | Status: AC
  Filled 2024-06-07: qty 10

## 2024-06-07 MED ORDER — CEFUROXIME AXETIL 500 MG PO TABS: 500.0000 mg | ORAL_TABLET | Freq: Two times a day (BID) | ORAL | 0 refills | Status: AC

## 2024-06-07 NOTE — Progress Notes (Signed)
 Mobility Specialist Progress Note:    06/07/24 1230  Mobility  Activity Ambulated with assistance  Level of Assistance Minimal assist, patient does 75% or more  Assistive Device Front wheel walker  Distance Ambulated (ft) 140 ft  Range of Motion/Exercises Active;All extremities  Activity Response Tolerated well  Mobility Referral Yes  Mobility visit 1 Mobility  Mobility Specialist Start Time (ACUTE ONLY) 1230  Mobility Specialist Stop Time (ACUTE ONLY) 1250  Mobility Specialist Time Calculation (min) (ACUTE ONLY) 20 min   Pt received sitting EOB, MD requesting O2 test. Required MinA to stand and CGA to ambulate with RW. Tolerated well, HR 122 and SpO2 93% at rest. During ambulation HR peaked at 140 and SpO2 91-92% on RA. Left in chair, alarm on. Call bell in reach, all needs met.  Aianna Fahs Mobility Specialist Please contact via Special Educational Needs Teacher or  Rehab office at [phone]

## 2024-06-07 NOTE — Plan of Care (Signed)
   Problem: Education: Goal: Knowledge of General Education information will improve Description: Including pain rating scale, medication(s)/side effects and non-pharmacologic comfort measures Outcome: Progressing   Problem: Activity: Goal: Risk for activity intolerance will decrease Outcome: Progressing   Problem: Nutrition: Goal: Adequate nutrition will be maintained Outcome: Progressing

## 2024-06-07 NOTE — Discharge Summary (Addendum)
 "                                                                                 Cody Palmer, is a 89 y.o. male  DOB November 14, 1932  MRN [id number].  Admission date:  06/02/2024  Admitting Physician  Pratik JONETTA Fairly, DO  Discharge Date:  06/07/2024   Primary MD  [name], NP  Recommendations for primary care physician for things to follow:  1)Very Low-salt diet advised---Less than 2 gm of Sodium per day advised----ok to use Mrs DASH salt substitute instead of Salt 2)Weigh yourself daily, call if you gain more than 3 pounds in 1 day or more than 5 pounds in 1 week as your diuretic medications may need to be adjusted 3)Avoid ibuprofen /Advil /Aleve/Motrin Josefine Powders/Naproxen/BC powders/Meloxicam /Diclofenac/Indomethacin and other Nonsteroidal anti-inflammatory medications as these will make you more likely to bleed and can cause stomach ulcers, can also cause Kidney problems.  4)Interventional radiology team will reach out to you to schedule PleurX catheter procedure for next week 5) please keep your appointment with your oncologist Dr. Davonna 6) outpatient follow-up with pulmonologist Dr. Ozell America advised  Admission Diagnosis  Pleural effusion [J90] Malignant ascites (HCC) [R18.0] Acute hypoxemic respiratory failure (HCC) [J96.01] History of follicular lymphoma [Z85.72] Pneumonia of right lung due to infectious organism, unspecified part of lung [J18.9] Sepsis, due to unspecified organism, unspecified whether acute organ dysfunction present (HCC) [A41.9] Acute hypoxic respiratory failure (HCC) [J96.01]   Discharge Diagnosis  Pleural effusion [J90] Malignant ascites (HCC) [R18.0] Acute hypoxemic respiratory failure (HCC) [J96.01] History of follicular lymphoma [Z85.72] Pneumonia of right lung due to infectious organism, unspecified part of lung [J18.9] Sepsis, due to unspecified organism, unspecified whether acute organ dysfunction present (HCC) [A41.9] Acute hypoxic  respiratory failure (HCC) [J96.01]   Principal Problem:   Acute hypoxemic respiratory failure (HCC) Active Problems:   Essential hypertension   Abdominal distention   DNR (do not resuscitate)   Follicular lymphoma (HCC)   CKD stage 3 due to type 2 diabetes mellitus (HCC)   Protein-calorie malnutrition, severe      Past Medical History:  Diagnosis Date   Arthritis    Cancer (HCC)    Skin cancer   Diabetes mellitus without complication (HCC)    HTN (hypertension)    Hypercholesteremia    Past Surgical History:  Procedure Laterality Date   CATARACT EXTRACTION W/PHACO Left 04/25/2016   Procedure: CATARACT EXTRACTION PHACO AND INTRAOCULAR LENS PLACEMENT LEFT EYE;  Surgeon: Cherene Mania, MD;  Location: [facility];  Service: Ophthalmology;  Laterality: Left;  CDE: 10.37   COLONOSCOPY N/A 04/05/2024   Procedure: COLONOSCOPY;  Surgeon: Cindie Carlin POUR, DO;  Location: [facility];  Service: Endoscopy;  Laterality: N/A;   CYSTOSCOPY WITH BIOPSY N/A 08/13/2013   Procedure: CYSTOSCOPY WITH BLADDER BIOPSY;  Surgeon: Garnette Shack, MD;  Location: [facility];  Service: Urology;  Laterality: N/A;   ENUCLEATION Right    Removed in 1958   EYE SURGERY  1958   removal of eye   TOTAL KNEE ARTHROPLASTY Right 01/11/2013   Procedure: RIGHT TOTAL KNEE ARTHROPLASTY;  Surgeon: Taft FORBES Minerva, MD;  Location: [facility];  Service: Orthopedics;  Laterality:  Right;   TOTAL KNEE ARTHROPLASTY Left 07/12/2022   Procedure: TOTAL KNEE ARTHROPLASTY;  Surgeon: Margrette Taft BRAVO, MD;  Location: [facility];  Service: Orthopedics;  Laterality: Left;     HPI  from the history and physical done on the day of admission:   Patient coming from: Home   Chief Complaint: Shortness of breath   HPI: Cody Palmer is a 89 y.o. male with medical history significant for CKD stage IIIa, hypertension, dyslipidemia, type 2 diabetes, and non-Hodgkin's lymphoma who presented to the ED with worsening shortness of breath over the last  few days.  He would routinely get paracentesis done, but has not required 1 in the last 3 weeks.  He has noticed some leg swelling as well as some mild abdominal distention and has increased cough and fatigue.  He normally does not wear oxygen at home and denies any other symptoms of fevers, chills, abdominal pain, chest pain, or sick contacts.   ED Course: Vital signs with tachycardia and noted tachypnea, but able to speak in complete sentences and does not appear to be in significant distress.  Currently on 2 L nasal cannula oxygen given hypoxemia with 89%.  Leukocytosis of 19,200 with hemoglobin 12.4 and lactic acid of 2.5 after initial noted to be 3.9.  BNP 933.  Patient noted to have significant right sided pleural effusion with atelectasis as well as small to moderate ascites in the setting of his lymphoma.  He was noted to be 89% on room air and started on nasal cannula oxygen.  He has been started on IV Lasix  for diuresis as well as azithromycin  and Rocephin  due to concerns for pneumonia.   Review of Systems: Reviewed as noted above, otherwise negative.   Hospital Course:   Assessment and Plan: 1)Acute hypoxic  respiratory failure secondary to large right pleural effusion with atelectasis --Etiology of rather large right-sided pleural effusion could be lymphoma versus pneumonia versus heart failure -Patient had Rt Thora on 06/03/2024 with 1.5 L removed and again Thora on the right side on 06/05/2024 with 2.9 L removed and again on 06/07/2024 with 1.7 L removed --Repeat x-ray on 06/08/23 continues to show residual pleural effusion on the right -Hypoxia resolved---currently on Room Air-post ambulation O2 sats 91 to 92% on room air -Discussed with patient and family members that he may be a candidate for PleurX catheter --They want to proceed with Pleurax cath --Pleural fluid LDH and pleural fluid protein pending to help calculate lights criteria for possible exudate versus transudate----suspect  this is an exudate most likely due to lymphoma -Patient did well with IV Lasix  okay to discharge home on oral list --- IR consult for thoracentesis and PleurX catheter placement requested --Per IR team patient will be contacted for PleurX catheter placement the week of 06/10/2024, patient's oncologist Dr. Davonna with hip order supplies for patient's PleurX catheter change--- Home health RN can help with PleurX catheter management   2)Sepsis, POA secondary to Pneumonia -Chest imaging with mostly right-sided pleural effusion unable to exclude possible underlying pneumonia at this time Respiratory status has improved significantly-currently on room air WBC 19.2 >>14.7 >>13.3 >>10.4 - Treated Azithromycin  and Rocephin --dc home on Ceftin and Azithromycin   - Gram stain with no organisms and culture with no growth from thoracentesis on 1/19, repeat thoracentesis on 06/05/24 and again on 06/07/24    3)Non-Hodgkin lymphoma -PET scan results from 05/30/2021 showed significant improvement - Follows with Dr. Davonna    4)Recurring Ascites likely secondary to lymphoma -  Small to moderate on abdominal imaging with no tense abdomen or signs of SBP - PRN  paracentesis as outpatient   CKD stage IIIb - Current creatinine near baseline - renally adjust medications, avoid nephrotoxic agents / dehydration  / hypotension   Hypertension - Currently stable, discharged home on Toprol -XL especially given finding of tachycardia   Type 2 diabetes--A1c 6.1 reflective of good diabetic control PTA - Continue metformin  and Jardiance    Sinus tachycardia - Likely ongoing in the setting of pleural effusion  -Toprol -XL as above  Generalized Weakness/ Ambulatory dysfunction--- PT eval appreciated recommends home health PT    Code Status: DNR Family Communication: Discussed with daughter Cody Palmer at bedside .   Consultants:  Interventional radiologist   Procedures:  Right sided thoracentesis 1/19, 06/05/24 and again on  06/07/2024  Discharge Condition: Stable without further hypoxia  Follow UP   Contact information for follow-up providers     [facility] Follow up.   Why: IR scheduler will call you to schedule your Pleurx placement appointment - please call [phone] or [phone] if you do not hear from anyone by Tuesday 1/27. Contact information: [address] [place]  [address]        Darlean Ozell NOVAK, MD. Schedule an appointment as soon as possible for a visit in 1 week(s).   Specialty: Pulmonary Disease Why: Recurrent pleural effusion Contact information: [address] [place] [address] [phone]         Davonna Siad, MD. Call.   Specialty: Oncology Contact information: [address] [place] [address] [phone]              Contact information for after-discharge care     Home Medical Care     [facility]) .   Service: Home Health Services Contact information: [phone] [place]  [phone]                     Consults obtained -interventional radiology  Diet and Activity recommendation:  As advised  Discharge Instructions    Discharge Instructions     Call MD for:  difficulty breathing, headache or visual disturbances   Complete by: As directed    Call MD for:  persistant dizziness or light-headedness   Complete by: As directed    Call MD for:  persistant nausea and vomiting   Complete by: As directed    Call MD for:  temperature >100.4   Complete by: As directed    Diet - low sodium heart healthy   Complete by: As directed    Discharge instructions   Complete by: As directed    1)Very Low-salt diet advised---Less than 2 gm of Sodium per day advised----ok to use Mrs DASH salt substitute instead of Salt 2)Weigh yourself daily, call if you gain more than 3 pounds in 1 day or more than 5 pounds in 1 week as your diuretic medications may need  to be adjusted 3)Avoid ibuprofen /Advil /Aleve/Motrin Josefine Powders/Naproxen/BC powders/Meloxicam /Diclofenac/Indomethacin and other Nonsteroidal anti-inflammatory medications as these will make you more likely to bleed and can cause stomach ulcers, can also cause Kidney problems.  4)Interventional radiology team will reach out to you to schedule PleurX catheter procedure for next week 5) please keep your appointment with your oncologist Dr. Davonna 6) outpatient follow-up with pulmonologist Dr. Ozell Darlean advised   Increase activity slowly   Complete by: As directed        Discharge Medications  Allergies as of 06/07/2024       Reactions   Rituximab  Shortness Of Breath, Cough   Patient had hypersensitivity reaction to rituximab  which included SHOB, cough, throat tightness. Emergency Pepcid , Benadryl , Solu-Medrol  given.         Medication List     STOP taking these medications    Januvia  25 MG tablet Generic drug: sitaGLIPtin        TAKE these medications    acetaminophen  325 MG tablet Commonly known as: TYLENOL  Take 2 tablets (650 mg total) by mouth every 6 (six) hours as needed for mild pain (pain score 1-3), fever or headache (or Fever >/= 101).   allopurinol  300 MG tablet Commonly known as: ZYLOPRIM  Take 0.5 tablets (150 mg total) by mouth daily.   aspirin  EC 81 MG tablet Take 1 tablet (81 mg total) by mouth daily with breakfast. Swallow whole. What changed: when to take this   azithromycin  500 MG tablet Commonly known as: ZITHROMAX  Take 1 tablet (500 mg total) by mouth daily for 3 days.   cefUROXime 500 MG tablet Commonly known as: CEFTIN Take 1 tablet (500 mg total) by mouth 2 (two) times daily with a meal for 5 days.   EYE HEALTH AREDS 2 PO Take 1 capsule by mouth 2 (two) times daily.   fluticasone  50 MCG/ACT nasal spray Commonly known as: FLONASE  Place 2 sprays into both nostrils daily.   furosemide  40 MG tablet Commonly known as: Lasix  Take 1  tablet (40 mg total) by mouth in the morning. What changed:  when to take this reasons to take this   Jardiance  25 MG Tabs tablet Generic drug: empagliflozin  Take 25 mg by mouth daily.   loratadine  10 MG tablet Commonly known as: CLARITIN  Take 1 tablet (10 mg total) by mouth daily.   metformin  500 MG (OSM) 24 hr tablet Commonly known as: FORTAMET  Take 1 tablet (500 mg total) by mouth 2 (two) times daily with a meal.   metoprolol succinate 25 MG 24 hr tablet Commonly known as: Toprol XL Take 1 tablet (25 mg total) by mouth daily.   omeprazole  40 MG capsule Commonly known as: PRILOSEC Take 1 capsule (40 mg total) by mouth daily.   prochlorperazine  10 MG tablet Commonly known as: COMPAZINE  Take 1 tablet (10 mg total) by mouth every 6 (six) hours as needed for nausea or vomiting.   rosuvastatin  10 MG tablet Commonly known as: CRESTOR  Take 1 tablet (10 mg total) by mouth every other day.   simethicone  80 MG chewable tablet Commonly known as: MYLICON Chew 1 tablet (80 mg total) by mouth every 6 (six) hours as needed for flatulence.   Vitamin D3 25 MCG (1000 UT) Caps Take 1,000 Units by mouth daily.       Major procedures and Radiology Reports - PLEASE review detailed and final reports for all details, in brief -   US  THORACENTESIS RIGHT ASP PLEURAL SPACE W/IMG GUIDE Result Date: 06/07/2024 INDICATION: 142230 Pleural effusion 142230 Patient with history of lymphoma, recurrent right pleural effusion. Request for diagnostic and therapeutic right thoracentesis. EXAM: ULTRASOUND GUIDED RIGHT THORACENTESIS MEDICATIONS: 5 mL 1% lidocaine  COMPLICATIONS: None immediate. PROCEDURE: An ultrasound guided thoracentesis was thoroughly discussed with the patient and questions answered. The benefits, risks, alternatives and complications were also discussed. The patient understands and wishes to proceed with the procedure. Written consent was obtained. Ultrasound was performed to localize and  mark an adequate pocket of fluid in the right chest. The area was then prepped  and draped in the normal sterile fashion. 1% Lidocaine  was used for local anesthesia. Under ultrasound guidance a 6 Fr Safe-T-Centesis catheter was introduced. Thoracentesis was performed. The catheter was removed and a dressing applied. FINDINGS: A total of approximately 1.7 L of serosanguineous fluid was removed. Samples were sent to the laboratory as requested by the clinical team. IMPRESSION: Successful ultrasound guided RIGHT thoracentesis yielding 1.7 L of pleural fluid. Performed by Clotilda Hesselbach, PA-C under supervision of Thom Hall, MD Electronically Signed   By: Thom Hall M.D.   On: 06/07/2024 14:26   DG Chest Portable 1 View Result Date: 06/07/2024 CLINICAL DATA:  Status post thoracentesis EXAM: PORTABLE CHEST 1 VIEW COMPARISON:  Same day FINDINGS: Right pleural effusion is slightly decreased in size compared to prior exam. No pneumothorax is noted. IMPRESSION: Slightly decreased size of right pleural effusion. No pneumothorax is noted. Electronically Signed   By: Lynwood Landy Raddle M.D.   On: 06/07/2024 11:24   DG CHEST PORT 1 VIEW Result Date: 06/07/2024 CLINICAL DATA:  Shortness of breath, pleural effusion EXAM: PORTABLE CHEST 1 VIEW COMPARISON:  Two days ago FINDINGS: Stable cardiomediastinal silhouette. Moderate size right pleural effusion is noted. Mild central pulmonary vascular congestion is noted. Bony thorax is unremarkable. IMPRESSION: Moderate size right pleural effusion. Mild central pulmonary vascular congestion. Electronically Signed   By: Lynwood Landy Raddle M.D.   On: 06/07/2024 07:50   US  THORACENTESIS RIGHT ASP PLEURAL SPACE W/IMG GUIDE Result Date: 06/05/2024 INDICATION: 89 year old male. History of lymphoma with recurrent right-sided pleural effusion. Request is for therapeutic right-sided thoracentesis. EXAM: ULTRASOUND GUIDED THERAPEUTIC RIGHT-SIDED THORACENTESIS MEDICATIONS: Lidocaine  1% 10 mL  COMPLICATIONS: None immediate. PROCEDURE: An ultrasound guided thoracentesis was thoroughly discussed with the patient and questions answered. The benefits, risks, alternatives and complications were also discussed. The patient understands and wishes to proceed with the procedure. Written consent was obtained. Ultrasound was performed to localize and mark an adequate pocket of fluid in the right chest. The area was then prepped and draped in the normal sterile fashion. 1% Lidocaine  was used for local anesthesia. Under ultrasound guidance a 19 gauge, 7-cm, Yueh catheter was introduced. Thoracentesis was performed. The catheter was removed and a dressing applied. FINDINGS: A total of approximately 2.9 L of serous colored fluid was removed. IMPRESSION: Successful ultrasound guided therapeutic right-sided thoracentesis yielding 2.9 of serous colored pleural fluid. Performed by Delon Beagle NP Electronically Signed   By: Ester Sides M.D.   On: 06/05/2024 09:12   DG Chest Portable 1 View Result Date: 06/05/2024 CLINICAL DATA:  Post right-sided thoracentesis. EXAM: PORTABLE CHEST 1 VIEW COMPARISON:  06/03/2024 FINDINGS: Lungs are hypoinflated demonstrate a moderate to large right pleural effusion with improvement post thoracentesis. Better aeration over the right upper lobe/apex. Likely compressive atelectasis over the right base. No pneumothorax. Left lung is clear. Cardiomediastinal silhouette and remainder of the exam is unchanged. IMPRESSION: Moderate to large right pleural effusion with improvement post thoracentesis. No pneumothorax. Electronically Signed   By: Toribio Agreste M.D.   On: 06/05/2024 09:12   NM PET Image Restag (PS) Skull Base To Thigh Result Date: 06/04/2024 CLINICAL DATA:  Subsequent treatment strategy for follicular lymphoma. EXAM: NUCLEAR MEDICINE PET SKULL BASE TO THIGH TECHNIQUE: 9.3 mCi F-18 FDG was injected intravenously. Full-ring PET imaging was performed from the skull base to  thigh after the radiotracer. CT data was obtained and used for attenuation correction and anatomic localization. Fasting blood glucose: 137 mg/dl COMPARISON:  PET-CT 87/81/7974 FINDINGS: Mediastinal blood  pool activity: SUV max 1.9 Liver activity: SUV max 3.0 NECK: No residual hypermetabolic lymph nodes in the neck (Deauville 1). Incidental CT findings: Stable bilateral carotid artery calcifications. CHEST: No residual supraclavicular or left subclavicular adenopathy. No residual hypermetabolic mediastinal or hilar nodes. No residual hypermetabolic epicardial nodes. The extensive hypermetabolic posterior mediastinal adenopathy seen on the prior study has significantly improved. There are small residual para esophageal nodes which have an SUV max of 6.8. This was previously 11.0. There is residual paraspinal/pleural hypermetabolism, right greater left. SUV max on right is 5.6. This was previously 3.8. No hypermetabolic pulmonary nodules. Incidental CT findings: Significant interval enlargement of the right pleural effusion. ABDOMEN/PELVIS: Significant improvement in the diffuse and widespread disease in the abdomen and pelvis. Some residual lymph nodes in the gastrohepatic ligament (SUV max 5.0) and periportal regions (SUV max 7.5) (Deauville 4). Small residual mesenteric and retroperitoneal lymph nodes but no hypermetabolism (Deauville 1). No residual pelvic or inguinal adenopathy (Deauville 1). Incidental CT findings: Stable vascular disease. Ill-defined matted soft tissue density throughout the mesentery and retroperitoneal consistent with treated lymphoma. Small volume abdominal/pelvic ascites. Bladder diverticuli noted. SKELETON: No residual hypermetabolic bone lesions.  (Deauville 1). Incidental CT findings: None. IMPRESSION: 1. Significant interval improvement in the diffuse and widespread disease in the neck, chest, abdomen and pelvis. There are small residual hypermetabolic lymph nodes in the posterior  mediastinum, gastrohepatic ligament and periportal regions (Deauville 4). 2. No residual hypermetabolic bone lesions. 3. Significant interval enlargement of the right pleural effusion. 4. Small volume abdominal/pelvic ascites. 5. Aortic atherosclerosis. Aortic Atherosclerosis (ICD10-I70.0). Electronically Signed   By: MYRTIS Stammer M.D.   On: 06/04/2024 23:30   US  THORACENTESIS RIGHT ASP PLEURAL SPACE W/IMG GUIDE Result Date: 06/03/2024 INDICATION: 142230 Pleural effusion 142230 Patient with history of lymphoma, recurrent ascites. Found to have new right pleural effusion on recent imaging. Request for diagnostic and therapeutic right thoracentesis. EXAM: ULTRASOUND GUIDED RIGHT THORACENTESIS MEDICATIONS: 5 mL 1% lidocaine  COMPLICATIONS: None immediate. PROCEDURE: An ultrasound guided thoracentesis was thoroughly discussed with the patient and questions answered. The benefits, risks, alternatives and complications were also discussed. The patient understands and wishes to proceed with the procedure. Written consent was obtained. Ultrasound was performed to localize and mark an adequate pocket of fluid in the right chest. The area was then prepped and draped in the normal sterile fashion. 1% Lidocaine  was used for local anesthesia. Under ultrasound guidance a 6 Fr Safe-T-Centesis catheter was introduced. Thoracentesis was performed. The catheter was removed and a dressing applied. FINDINGS: A total of approximately 1.5 L of bloody fluid was removed. Procedure was stopped at this amount due to first-time thoracentesis limit, patient with significant residual pleural effusion on post procedure ultrasound. Samples were sent to the laboratory as requested by the clinical team. IMPRESSION: Successful ultrasound guided RIGHT thoracentesis yielding 1.5 L of pleural fluid. Performed by Clotilda Hesselbach, PA-C under supervision of Thom Hall, MD Electronically Signed   By: Thom Hall M.D.   On: 06/03/2024 16:31   DG  Chest Port 1 View Result Date: 06/03/2024 CLINICAL DATA:  Post thoracentesis EXAM: PORTABLE CHEST 1 VIEW COMPARISON:  06/02/2024, chest CT 06/02/2024 FINDINGS: Persistent large right-sided pleural effusion with near complete opacification of right thorax, small amount of aerated right apex. No gross pneumothorax, suspect soft tissue or skin artifact over the right apex and lateral aspect of the chest. Probable cardiomegaly. Suspect small left effusion. Increasing airspace disease at left base. Aortic atherosclerosis. IMPRESSION: 1. Persistent large right-sided pleural effusion  with near complete opacification of right thorax. No gross pneumothorax. 2. Suspect small left effusion with increasing airspace disease at the left base. Electronically Signed   By: Cody Palmer Bun M.D.   On: 06/03/2024 15:26   CT Angio Chest Pulmonary Embolism (PE) W or WO Contrast Result Date: 06/02/2024 EXAM: CTA CHEST PE WITHOUT AND WITH CONTRAST CT ABDOMEN AND PELVIS WITHOUT AND WITH CONTRAST 06/02/2024 09:43:31 AM TECHNIQUE: CTA of the chest was performed after the administration of intravenous contrast. Multiplanar reformatted images are provided for review. MIP images are provided for review. CT of the abdomen and pelvis was performed without and with the administration of intravenous contrast. Automated exposure control, iterative reconstruction, and/or weight based adjustment of the mA/kV was utilized to reduce the radiation dose to as low as reasonably achievable. COMPARISON: PET/CT 05/02/2024. CLINICAL HISTORY: Lymphoma. Patient presents with shortness of breath and abdominal distention. FINDINGS: CHEST: PULMONARY ARTERIES: Pulmonary arteries are adequately opacified for evaluation. No intraluminal filling defect to suggest pulmonary embolism. Main pulmonary artery is normal in caliber. MEDIASTINUM: No mediastinal lymphadenopathy. No axillary or supraclavicular adenopathy. Previous right costophrenic (CP) angle lymph node  measures 1.5 cm (image 101/5), previously 1.6 cm. The heart and pericardium demonstrate no acute abnormality. Aortic atherosclerosis and coronary artery calcification. There is no acute abnormality of the thoracic aorta. LUNGS AND PLEURA: There is a large right pleural effusion which is significantly increased from previous exam with near complete atelectasis of the right lung with significantly reduced aeration to the right middle lobe, right upper lobe, and right lower lobe. Diffuse hazy ground-glass opacities are noted within the left lung, with a few scattered areas of air trapping in the lower lung zone, suggesting small airways disease. No pneumothorax. SOFT TISSUES AND BONES: No acute bone or soft tissue abnormality. ABDOMEN AND PELVIS: LIVER: Posterior the right lobe of the liver cyst is unchanged, measuring 1.4 cm. A focal hemangioma is noted within the anterior right hepatic lobe, unchanged measuring 2.1 cm. GALLBLADDER AND BILE DUCTS: Tiny stone noted within the gallbladder measuring 3 mm. Gallbladder wall thickening measures 5 mm in thickness. Small amount of pericholecystic fluid. No biliary ductal dilatation. SPLEEN: The spleen is within normal limits in size and appearance. PANCREAS: The pancreas is normal in size and contour without a focal lesion or ductal dilatation. ADRENAL GLANDS: Normal size and morphology bilaterally. No nodule, thickening, or hemorrhage. No periadrenal stranding. KIDNEYS, URETERS AND BLADDER: Bilateral pelvocaliectasis. No obstructing stones or mass noted. No perinephric or periureteral stranding. Bladder diverticula noted right dome of bladder diverticula measures 3.9 cm. GI AND BOWEL: Stomach appears normal. There is no pathologic dilatation of the large or small bowel loops. There is wall thickening involving the right lower quadrant small bowel loops, which have wall thickness measuring up to 7 mm. Wall thickening involving the cecum, ascending, and proximal transverse  colon are identified which subjectively may be improved from 04/15/2024. Wall thickening with surrounding soft tissue stranding is noted involving the left colon from the splenic flexure to the mid sigmoid colon. Extensive left-sided colonic diverticulosis without signs of acute diverticulitis. REPRODUCTIVE: Reproductive organs are unremarkable. PERITONEUM AND RETROPERITONEUM: Soft tissue infiltration which involves the root of mesentery and extending into the right lower quadrant. Ileocolic mesentery is again noted but appears improved in the interval. The central mesenteric tumor measures 8.1 x 2.2 cm (image 50/2), on the previous exam this measured 14.5 x 6.5 cm. Tumor within the right lower quadrant ileocolic mesentery encasing the vascular pedicle for the  right lower quadrant small bowel loops and right colon has also decreased in the interval. This measures 6.8 x 3.9 cm (image 57/2), previously 10.9 x 9.1 cm. There is a small volume moderate ascites overlying the liver. Compared with the prior imaging the volume of ascites within the abdomen and pelvis is decreased in the interval with resolution of free fluid within the pelvis. There are no signs of bowel perforation or abscess. No free air. LYMPH NODES: Left periaortic lymph node measures 1.2 cm (image 42/2). Previously 2.3 cm. BONES AND SOFT TISSUES: Lumbar degenerative disc disease. No acute abnormality of the visualized bones. No focal soft tissue abnormality. IMPRESSION: 1. No evidence of acute pulmonary embolism. 2. Large right pleural effusion, significantly increased from prior, with near complete right lung atelectasis, likely accounting for shortness of breath; consider malignant effusion (given lymphoma), heart failure, or infection, and consider diagnostic and therapeutic thoracentesis with follow-up chest imaging after drainage. 3. Diffuse hazy left lung ground-glass opacities with scattered air trapping, which may reflect pulmonary edema and/or  small airways disease. 4. Small-to-moderate ascites, decreased from prior, which may contribute to abdominal distention. 5. Interval decrease in mesenteric tumor burden and retroperitoneal/porta hepatis adenopathy, compatible with treatment response; continue oncologic follow-up imaging per lymphoma protocol. 6. Persistent bowel wall thickening involving right lower quadrant small bowel and colon with surrounding stranding, overall subjectively improved on the right, with differential including enterocolitis, or lymphomatous involvement. 7. No signs of bowel obstruction, perforation, or abscess formation. 8. Wall thickening and pericholecystic fluid involving the gallbladder. This is nonspecific in the setting of ascites. Correlate for any clinical signs or symptoms of cholecystitis. Electronically signed by: Waddell Calk MD 06/02/2024 10:34 AM EST RP Workstation: [id number]   CT ABDOMEN PELVIS W CONTRAST Result Date: 06/02/2024 EXAM: CTA CHEST PE WITHOUT AND WITH CONTRAST CT ABDOMEN AND PELVIS WITHOUT AND WITH CONTRAST 06/02/2024 09:43:31 AM TECHNIQUE: CTA of the chest was performed after the administration of intravenous contrast. Multiplanar reformatted images are provided for review. MIP images are provided for review. CT of the abdomen and pelvis was performed without and with the administration of intravenous contrast. Automated exposure control, iterative reconstruction, and/or weight based adjustment of the mA/kV was utilized to reduce the radiation dose to as low as reasonably achievable. COMPARISON: PET/CT 05/02/2024. CLINICAL HISTORY: Lymphoma. Patient presents with shortness of breath and abdominal distention. FINDINGS: CHEST: PULMONARY ARTERIES: Pulmonary arteries are adequately opacified for evaluation. No intraluminal filling defect to suggest pulmonary embolism. Main pulmonary artery is normal in caliber. MEDIASTINUM: No mediastinal lymphadenopathy. No axillary or supraclavicular adenopathy.  Previous right costophrenic (CP) angle lymph node measures 1.5 cm (image 101/5), previously 1.6 cm. The heart and pericardium demonstrate no acute abnormality. Aortic atherosclerosis and coronary artery calcification. There is no acute abnormality of the thoracic aorta. LUNGS AND PLEURA: There is a large right pleural effusion which is significantly increased from previous exam with near complete atelectasis of the right lung with significantly reduced aeration to the right middle lobe, right upper lobe, and right lower lobe. Diffuse hazy ground-glass opacities are noted within the left lung, with a few scattered areas of air trapping in the lower lung zone, suggesting small airways disease. No pneumothorax. SOFT TISSUES AND BONES: No acute bone or soft tissue abnormality. ABDOMEN AND PELVIS: LIVER: Posterior the right lobe of the liver cyst is unchanged, measuring 1.4 cm. A focal hemangioma is noted within the anterior right hepatic lobe, unchanged measuring 2.1 cm. GALLBLADDER AND BILE DUCTS: Tiny stone noted within  the gallbladder measuring 3 mm. Gallbladder wall thickening measures 5 mm in thickness. Small amount of pericholecystic fluid. No biliary ductal dilatation. SPLEEN: The spleen is within normal limits in size and appearance. PANCREAS: The pancreas is normal in size and contour without a focal lesion or ductal dilatation. ADRENAL GLANDS: Normal size and morphology bilaterally. No nodule, thickening, or hemorrhage. No periadrenal stranding. KIDNEYS, URETERS AND BLADDER: Bilateral pelvocaliectasis. No obstructing stones or mass noted. No perinephric or periureteral stranding. Bladder diverticula noted right dome of bladder diverticula measures 3.9 cm. GI AND BOWEL: Stomach appears normal. There is no pathologic dilatation of the large or small bowel loops. There is wall thickening involving the right lower quadrant small bowel loops, which have wall thickness measuring up to 7 mm. Wall thickening involving  the cecum, ascending, and proximal transverse colon are identified which subjectively may be improved from 04/15/2024. Wall thickening with surrounding soft tissue stranding is noted involving the left colon from the splenic flexure to the mid sigmoid colon. Extensive left-sided colonic diverticulosis without signs of acute diverticulitis. REPRODUCTIVE: Reproductive organs are unremarkable. PERITONEUM AND RETROPERITONEUM: Soft tissue infiltration which involves the root of mesentery and extending into the right lower quadrant. Ileocolic mesentery is again noted but appears improved in the interval. The central mesenteric tumor measures 8.1 x 2.2 cm (image 50/2), on the previous exam this measured 14.5 x 6.5 cm. Tumor within the right lower quadrant ileocolic mesentery encasing the vascular pedicle for the right lower quadrant small bowel loops and right colon has also decreased in the interval. This measures 6.8 x 3.9 cm (image 57/2), previously 10.9 x 9.1 cm. There is a small volume moderate ascites overlying the liver. Compared with the prior imaging the volume of ascites within the abdomen and pelvis is decreased in the interval with resolution of free fluid within the pelvis. There are no signs of bowel perforation or abscess. No free air. LYMPH NODES: Left periaortic lymph node measures 1.2 cm (image 42/2). Previously 2.3 cm. BONES AND SOFT TISSUES: Lumbar degenerative disc disease. No acute abnormality of the visualized bones. No focal soft tissue abnormality. IMPRESSION: 1. No evidence of acute pulmonary embolism. 2. Large right pleural effusion, significantly increased from prior, with near complete right lung atelectasis, likely accounting for shortness of breath; consider malignant effusion (given lymphoma), heart failure, or infection, and consider diagnostic and therapeutic thoracentesis with follow-up chest imaging after drainage. 3. Diffuse hazy left lung ground-glass opacities with scattered air  trapping, which may reflect pulmonary edema and/or small airways disease. 4. Small-to-moderate ascites, decreased from prior, which may contribute to abdominal distention. 5. Interval decrease in mesenteric tumor burden and retroperitoneal/porta hepatis adenopathy, compatible with treatment response; continue oncologic follow-up imaging per lymphoma protocol. 6. Persistent bowel wall thickening involving right lower quadrant small bowel and colon with surrounding stranding, overall subjectively improved on the right, with differential including enterocolitis, or lymphomatous involvement. 7. No signs of bowel obstruction, perforation, or abscess formation. 8. Wall thickening and pericholecystic fluid involving the gallbladder. This is nonspecific in the setting of ascites. Correlate for any clinical signs or symptoms of cholecystitis. Electronically signed by: Waddell Calk MD 06/02/2024 10:34 AM EST RP Workstation: [id number]   DG Chest 1 View Result Date: 06/02/2024 EXAM: 1 VIEW(S) XRAY OF THE CHEST 06/02/2024 09:08:49 AM COMPARISON: PET CT 05/30/2024. CLINICAL HISTORY: sob FINDINGS: LUNGS AND PLEURA: Large right pleural effusion with atelectatic changes involving the right lung is unchanged from PET/CT dated 10/28/2024. Left lung appears clear. No pneumothorax. HEART  AND MEDIASTINUM: Aortic atherosclerosis. Stable cardiomediastinal contours. BONES AND SOFT TISSUES: No acute osseous abnormality. IMPRESSION: 1. Large right pleural effusion with atelectatic changes involving the right lung. Electronically signed by: Waddell Calk MD 06/02/2024 09:42 AM EST RP Workstation: [id number]   Micro Results   Recent Results (from the past 240 hours)  Culture, blood (routine x 2)     Status: None   Collection Time: 06/02/24  8:51 AM   Specimen: BLOOD  Result Value Ref Range Status   Specimen Description BLOOD BLOOD LEFT FOREARM  Final   Special Requests   Final    BOTTLES DRAWN AEROBIC AND ANAEROBIC Blood Culture  adequate volume   Culture   Final    NO GROWTH 5 DAYS Performed at [facility], [address], [place], [place] [address]    Report Status 06/07/2024 FINAL  Final  Resp panel by RT-PCR (RSV, Flu A&B, Covid) Anterior Nasal Swab     Status: None   Collection Time: 06/02/24  9:06 AM   Specimen: Anterior Nasal Swab  Result Value Ref Range Status   SARS Coronavirus 2 by RT PCR NEGATIVE NEGATIVE Final    Comment: (NOTE) SARS-CoV-2 target nucleic acids are NOT DETECTED.  The SARS-CoV-2 RNA is generally detectable in upper respiratory specimens during the acute phase of infection. The lowest concentration of SARS-CoV-2 viral copies this assay can detect is 138 copies/mL. A negative result does not preclude SARS-Cov-2 infection and should not be used as the sole basis for treatment or other patient management decisions. A negative result may occur with  improper specimen collection/handling, submission of specimen other than nasopharyngeal swab, presence of viral mutation(s) within the areas targeted by this assay, and inadequate number of viral copies(<138 copies/mL). A negative result must be combined with clinical observations, patient history, and epidemiological information. The expected result is Negative.  Fact Sheet for Patients:  bloggercourse.com  Fact Sheet for Healthcare Providers:  seriousbroker.it  This test is no t yet approved or cleared by the [place]  FDA and  has been authorized for detection and/or diagnosis of SARS-CoV-2 by FDA under an Emergency Use Authorization (EUA). This EUA will remain  in effect (meaning this test can be used) for the duration of the COVID-19 declaration under Section 564(b)(1) of the Act, 21 U.S.C.section 360bbb-3(b)(1), unless the authorization is terminated  or revoked sooner.       Influenza A by PCR NEGATIVE NEGATIVE Final   Influenza B by PCR NEGATIVE NEGATIVE Final     Comment: (NOTE) The Xpert Xpress SARS-CoV-2/FLU/RSV plus assay is intended as an aid in the diagnosis of influenza from Nasopharyngeal swab specimens and should not be used as a sole basis for treatment. Nasal washings and aspirates are unacceptable for Xpert Xpress SARS-CoV-2/FLU/RSV testing.  Fact Sheet for Patients: bloggercourse.com  Fact Sheet for Healthcare Providers: seriousbroker.it  This test is not yet approved or cleared by the [place]  FDA and has been authorized for detection and/or diagnosis of SARS-CoV-2 by FDA under an Emergency Use Authorization (EUA). This EUA will remain in effect (meaning this test can be used) for the duration of the COVID-19 declaration under Section 564(b)(1) of the Act, 21 U.S.C. section 360bbb-3(b)(1), unless the authorization is terminated or revoked.     Resp Syncytial Virus by PCR NEGATIVE NEGATIVE Final    Comment: (NOTE) Fact Sheet for Patients: bloggercourse.com  Fact Sheet for Healthcare Providers: seriousbroker.it  This test is not yet approved or cleared by the [place]  FDA and has  been authorized for detection and/or diagnosis of SARS-CoV-2 by FDA under an Emergency Use Authorization (EUA). This EUA will remain in effect (meaning this test can be used) for the duration of the COVID-19 declaration under Section 564(b)(1) of the Act, 21 U.S.C. section 360bbb-3(b)(1), unless the authorization is terminated or revoked.  Performed at [facility], [address], [place], [place] [address]   Culture, blood (routine x 2)     Status: None   Collection Time: 06/02/24  9:11 AM   Specimen: BLOOD  Result Value Ref Range Status   Specimen Description BLOOD LEFT ANTECUBITAL  Final   Special Requests   Final    BOTTLES DRAWN AEROBIC AND ANAEROBIC Blood Culture adequate volume   Culture   Final    NO GROWTH 5 DAYS Performed at  [facility], [address], [place], [place] [address]    Report Status 06/07/2024 FINAL  Final  Urine Culture     Status: Abnormal   Collection Time: 06/02/24 11:27 AM   Specimen: Urine, Random  Result Value Ref Range Status   Specimen Description   Final    URINE, RANDOM Performed at [facility], [address], [place], [place] [address]    Special Requests   Final    URINE, CLEAN CATCH Performed at [facility], [address], [place], [place] [address]    Culture 10,000 COLONIES/mL ESCHERICHIA COLI (A)  Final   Report Status 06/04/2024 FINAL  Final   Organism ID, Bacteria ESCHERICHIA COLI (A)  Final      Susceptibility   Escherichia coli - MIC*    AMPICILLIN <=2 SENSITIVE Sensitive     CEFAZOLIN  (URINE) Value in next row Sensitive      <=1 SENSITIVEThis is a modified FDA-approved test that has been validated and its performance characteristics determined by the reporting laboratory.  This laboratory is certified under the Clinical Laboratory Improvement Amendments CLIA as qualified to perform high complexity clinical laboratory testing.    CEFEPIME Value in next row Sensitive      <=1 SENSITIVEThis is a modified FDA-approved test that has been validated and its performance characteristics determined by the reporting laboratory.  This laboratory is certified under the Clinical Laboratory Improvement Amendments CLIA as qualified to perform high complexity clinical laboratory testing.    ERTAPENEM Value in next row Sensitive      <=1 SENSITIVEThis is a modified FDA-approved test that has been validated and its performance characteristics determined by the reporting laboratory.  This laboratory is certified under the Clinical Laboratory Improvement Amendments CLIA as qualified to perform high complexity clinical laboratory testing.    CEFTRIAXONE  Value in next row Sensitive      <=1 SENSITIVEThis is a modified FDA-approved test that has been validated and its performance  characteristics determined by the reporting laboratory.  This laboratory is certified under the Clinical Laboratory Improvement Amendments CLIA as qualified to perform high complexity clinical laboratory testing.    CIPROFLOXACIN  Value in next row Sensitive      <=1 SENSITIVEThis is a modified FDA-approved test that has been validated and its performance characteristics determined by the reporting laboratory.  This laboratory is certified under the Clinical Laboratory Improvement Amendments CLIA as qualified to perform high complexity clinical laboratory testing.    GENTAMICIN Value in next row Sensitive      <=1 SENSITIVEThis is a modified FDA-approved test that has been validated and its performance characteristics determined by the reporting laboratory.  This laboratory is certified under  the Clinical Laboratory Improvement Amendments CLIA as qualified to perform high complexity clinical laboratory testing.    NITROFURANTOIN Value in next row Sensitive      <=1 SENSITIVEThis is a modified FDA-approved test that has been validated and its performance characteristics determined by the reporting laboratory.  This laboratory is certified under the Clinical Laboratory Improvement Amendments CLIA as qualified to perform high complexity clinical laboratory testing.    TRIMETH/SULFA Value in next row Sensitive      <=1 SENSITIVEThis is a modified FDA-approved test that has been validated and its performance characteristics determined by the reporting laboratory.  This laboratory is certified under the Clinical Laboratory Improvement Amendments CLIA as qualified to perform high complexity clinical laboratory testing.    AMPICILLIN/SULBACTAM Value in next row Sensitive      <=1 SENSITIVEThis is a modified FDA-approved test that has been validated and its performance characteristics determined by the reporting laboratory.  This laboratory is certified under the Clinical Laboratory Improvement Amendments CLIA as  qualified to perform high complexity clinical laboratory testing.    PIP/TAZO Value in next row Sensitive      <=4 SENSITIVEThis is a modified FDA-approved test that has been validated and its performance characteristics determined by the reporting laboratory.  This laboratory is certified under the Clinical Laboratory Improvement Amendments CLIA as qualified to perform high complexity clinical laboratory testing.    MEROPENEM Value in next row Sensitive      <=4 SENSITIVEThis is a modified FDA-approved test that has been validated and its performance characteristics determined by the reporting laboratory.  This laboratory is certified under the Clinical Laboratory Improvement Amendments CLIA as qualified to perform high complexity clinical laboratory testing.    * 10,000 COLONIES/mL ESCHERICHIA COLI  MRSA Next Gen by PCR, Nasal     Status: Abnormal   Collection Time: 06/02/24 12:12 PM   Specimen: Nasal Mucosa; Nasal Swab  Result Value Ref Range Status   MRSA by PCR Next Gen DETECTED (A) NOT DETECTED Final    Comment: RESULT CALLED TO, READ BACK BY AND VERIFIED WITH: A RAY AT 1724 ON [id number] BY S DALTON (NOTE) The GeneXpert MRSA Assay (FDA approved for NASAL specimens only), is one component of a comprehensive MRSA colonization surveillance program. It is not intended to diagnose MRSA infection nor to guide or monitor treatment for MRSA infections. Test performance is not FDA approved in patients less than 64 years old. Performed at [facility], [address], [place], [place] [address]   Culture, body fluid w Gram Stain-bottle     Status: None (Preliminary result)   Collection Time: 06/03/24  2:10 PM   Specimen: Pleura  Result Value Ref Range Status   Specimen Description PLEURAL BOTTLES DRAWN AEROBIC AND ANAEROBIC  Final   Special Requests 10CC  Final   Culture   Final    NO GROWTH 4 DAYS Performed at [facility], [address] [place], [place] [address]    Report Status PENDING   Incomplete  Gram stain     Status: None   Collection Time: 06/03/24  2:10 PM   Specimen: Pleura  Result Value Ref Range Status   Specimen Description PLEURAL  Final   Special Requests NONE  Final   Gram Stain   Final    NO ORGANISMS SEEN WBC PRESENT,BOTH PMN AND MONONUCLEAR CYTOSPIN SMEAR Performed at [facility], [address], [place], [place] [address]    Report Status 06/03/2024 FINAL  Final  Culture, body fluid w  Gram Stain-bottle     Status: None (Preliminary result)   Collection Time: 06/07/24 10:45 AM   Specimen: Pleura  Result Value Ref Range Status   Specimen Description PLEURAL BLOOD  Final   Special Requests   Final    BOTTLES DRAWN AEROBIC AND ANAEROBIC Blood Culture adequate volume Performed at [facility], [address] [place], [place] [address]    Culture PENDING  Incomplete   Report Status PENDING  Incomplete  Gram stain     Status: None   Collection Time: 06/07/24 10:45 AM   Specimen: Pleura  Result Value Ref Range Status   Specimen Description PLEURAL  Final   Special Requests NONE  Final   Gram Stain   Final    WBC PRESENT,BOTH PMN AND MONONUCLEAR NO ORGANISMS SEEN CYTOSPIN SMEAR Performed at [facility], [address], [place], [place] [address]    Report Status 06/07/2024 FINAL  Final   Today   Subjective    Cody Palmer today has no new complaints No fever  Or chills   No Nausea, Vomiting or Diarrhea  Patient ambulated with mobility specialist O2 sats postablation 91 to 92% on room air-- --tachycardia noted, no dizziness palpitations no significant dyspnea           Patient has been seen and examined prior to discharge   Objective   Blood pressure 121/70, pulse (!) 122, temperature 98.6 F (37 C), resp. rate 19, height 5' 9 (1.753 m), weight 74.6 kg, SpO2 92%.   Intake/Output Summary (Last 24 hours) at 06/07/2024 1448 Last data filed at 06/07/2024 0800 Gross per 24 hour  Intake 300 ml  Output 1200 ml  Net -900 ml     Exam Gen:- Awake Alert, no acute distress  HEENT:- [place].AT, No sclera icterus Neck-Supple Neck,No JVD,.  Lungs-improving air movement on the right, no wheezing  CV- S1, S2 normal, regular, tachycardic Abd-  +ve B.Sounds, Abd Soft, No tenderness,    Extremity/Skin:- No  edema,   good pulses Psych-affect is appropriate, oriented x3 Neuro-generalized weakness, no new focal deficits, no tremors    Data Review   CBC w Diff:  Lab Results  Component Value Date   WBC 10.4 06/07/2024   HGB 10.0 (L) 06/07/2024   HGB 12.7 (L) 03/27/2024   HCT 32.6 (L) 06/07/2024   HCT 38.9 03/27/2024   PLT 264 06/07/2024   PLT 126 (L) 03/27/2024   LYMPHOPCT 17 06/02/2024   BANDSPCT 1 03/27/2024   MONOPCT 8 06/02/2024   EOSPCT 0 06/02/2024   BASOPCT 0 06/02/2024   CMP:  Lab Results  Component Value Date   NA 139 06/07/2024   NA 139 03/27/2024   K 3.5 06/07/2024   CL 99 06/07/2024   CO2 33 (H) 06/07/2024   BUN 20 06/07/2024   BUN 32 (H) 03/27/2024   CREATININE 0.91 06/07/2024   PROT 4.9 (L) 06/07/2024   PROT 6.4 03/27/2024   ALBUMIN  2.7 (L) 06/07/2024   ALBUMIN  4.0 03/27/2024   BILITOT 0.3 06/07/2024   BILITOT 0.7 03/27/2024   ALKPHOS 93 06/07/2024   AST 19 06/07/2024   ALT 11 06/07/2024   Total Discharge time is about 33 minutes  Rendall Carwin M.D on 06/07/2024 at 2:48 PM  Go to [email or web address] -  for contact info  [facility] - Office  [phone]   "

## 2024-06-07 NOTE — Discharge Instructions (Addendum)
 1)Very Low-salt diet advised---Less than 2 gm of Sodium per day advised----ok to use Mrs DASH salt substitute instead of Salt 2)Weigh yourself daily, call if you gain more than 3 pounds in 1 day or more than 5 pounds in 1 week as your diuretic medications may need to be adjusted 3)Avoid ibuprofen /Advil /Aleve/Motrin Josefine Powders/Naproxen/BC powders/Meloxicam /Diclofenac/Indomethacin and other Nonsteroidal anti-inflammatory medications as these will make you more likely to bleed and can cause stomach ulcers, can also cause Kidney problems.  4)Interventional radiology team will reach out to you to schedule PleurX catheter procedure for next week 5) please keep your appointment with your oncologist Dr. Davonna 6) outpatient follow-up with pulmonologist Dr. Ozell America advised

## 2024-06-07 NOTE — Procedures (Signed)
 PROCEDURE SUMMARY:  Successful image-guided right thoracentesis. Yielded 1.7 liters of serosanguineous fluid. Patient tolerated procedure well. EBL < 1 mL. No immediate complications.  Specimen was sent for labs. Post procedure CXR shows no pneumothorax.  Please see imaging section of Epic for full dictation.  Clotilda DELENA Hesselbach PA-C 06/07/2024 10:54 AM

## 2024-06-07 NOTE — Care Management Important Message (Signed)
 Important Message  Patient Details  Name: Cody Palmer MRN: [id number] Date of Birth: [birth year]   Important Message Given:  Yes - Medicare IM     Seith Aikey L Ryanna Teschner 06/07/2024, 12:06 PM

## 2024-06-07 NOTE — Plan of Care (Signed)
 Request to IR for right sided pleurx placement - patient history and imaging reviewed by Dr. Vanice who approves procedure.   Discussed with hospitalist and oncologist today - plan for d/c over the weekend with outpatient pleurx placement next week at [facility] or [facility]. Oncology will manage supplies and drainage orders after placement.   Outpatient order for procedure placed, IR contact information placed in AVS. IR scheduler will call patient to schedule procedure/review pre-procedure instructions after discharge.   Discussed above with patient who states understanding and is in agreement to proceed as planned.  Cody Hesselbach, PA-C

## 2024-06-07 NOTE — Progress Notes (Signed)
 Hiiiiii -- this guy has been approved for right pleurx placement by [facility] already, he's going to be discharged today and return to [facility] for outpatient placement next week. I'll place an outpatient order and brief note in the chart, just wanted to give you a heads up :)

## 2024-06-07 NOTE — Progress Notes (Signed)
 Pt had ultrasound guided thoracentesis. VSS. 1.7 L of serous fluid off pt. Tolerated well. Pt taken back upstairs by transport in no acute distress.

## 2024-06-07 NOTE — TOC Transition Note (Signed)
 Transition of Care [facility]) - Discharge Note   Patient Details  Name: Cody Palmer MRN: [id number] Date of Birth: February 21, 1933  [facility] CM/SW Contact:  Hoy DELENA Bigness, LCSW Phone Number: 06/07/2024, 12:01 PM   Clinical Narrative:    Pt to return home w/ daughter and will receive HHPT/OT w/ [facility].    Final next level of care: Home w Home Health Services Barriers to Discharge: Barriers Resolved   Patient Goals and CMS Choice            Discharge Placement                       Discharge Plan and Services Additional resources added to the After Visit Summary for   In-house Referral: Clinical Social Work Discharge Planning Services: NA Post Acute Care Choice: Skilled Nursing Facility, Home Health          DME Arranged: N/A DME Agency: NA       HH Arranged: PT, OT HH Agency: [facility]        Social Drivers of Health (SDOH) Interventions SDOH Screenings   Food Insecurity: No Food Insecurity (06/02/2024)  Housing: Low Risk (06/02/2024)  Transportation Needs: No Transportation Needs (06/02/2024)  Utilities: Not At Risk (06/02/2024)  Depression (PHQ2-9): Low Risk (05/28/2024)  Social Connections: Unknown (06/02/2024)  Recent Concern: Social Connections - Moderately Isolated (04/10/2024)  Tobacco Use: Medium Risk (06/07/2024)     Readmission Risk Interventions    06/07/2024   12:00 PM 05/13/2024   10:44 AM 04/19/2024    1:46 PM  Readmission Risk Prevention Plan  Transportation Screening Complete Complete Complete  PCP or Specialist Appt within 3-5 Days   Not Complete  HRI or Home Care Consult  Complete Complete  Social Work Consult for Recovery Care Planning/Counseling  Complete Complete  Palliative Care Screening  Not Applicable Not Applicable  Medication Review Oceanographer) Complete Complete Complete  PCP or Specialist appointment within 3-5 days of discharge Complete    HRI or Home Care Consult Complete     SW Recovery Care/Counseling Consult Complete    Palliative Care Screening Not Applicable    Skilled Nursing Facility Not Applicable

## 2024-06-08 LAB — CULTURE, BODY FLUID W GRAM STAIN -BOTTLE: Culture: NO GROWTH

## 2024-06-09 LAB — PROTEIN, BODY FLUID (OTHER): Total Protein, Body Fluid Other: 3.2 g/dL

## 2024-06-09 LAB — LD, BODY FLUID (OTHER): LD, Body Fluid: 6902 [IU]/L

## 2024-06-11 ENCOUNTER — Inpatient Hospital Stay

## 2024-06-11 LAB — FLOW CYTOMETRY REQUEST - FLUID (INPATIENT)

## 2024-06-12 ENCOUNTER — Telehealth (HOSPITAL_COMMUNITY): Payer: Self-pay | Admitting: Radiology

## 2024-06-12 LAB — CULTURE, BODY FLUID W GRAM STAIN -BOTTLE
Culture: NO GROWTH
Special Requests: ADEQUATE

## 2024-06-12 NOTE — Telephone Encounter (Signed)
 Called pt to schedule his PleurX cath placement this week. Patient states he is unsure if he still needs it or not. I sent an in basket message to Dr. Darlean and Dr. Davonna asking if he needs this or not. Will call pt back once I get an answer from one of them. Patient agrees with this plan of care. JM

## 2024-06-13 ENCOUNTER — Inpatient Hospital Stay

## 2024-06-13 ENCOUNTER — Ambulatory Visit (HOSPITAL_COMMUNITY)
Admission: RE | Admit: 2024-06-13 | Discharge: 2024-06-13 | Disposition: A | Discharge status: Home / Self Care | Source: Ambulatory Visit | Attending: Oncology | Admitting: Oncology

## 2024-06-13 ENCOUNTER — Other Ambulatory Visit: Payer: Self-pay | Admitting: *Deleted

## 2024-06-13 VITALS — BP 145/70 | HR 100 | Temp 98.0°F | Resp 20 | Wt 171.2 lb

## 2024-06-13 DIAGNOSIS — J90 Pleural effusion, not elsewhere classified: Secondary | ICD-10-CM | POA: Insufficient documentation

## 2024-06-13 DIAGNOSIS — Z79620 Long term (current) use of immunosuppressive biologic: Secondary | ICD-10-CM | POA: Diagnosis not present

## 2024-06-13 DIAGNOSIS — C8295 Follicular lymphoma, unspecified, lymph nodes of inguinal region and lower limb: Secondary | ICD-10-CM | POA: Diagnosis present

## 2024-06-13 DIAGNOSIS — C8299 Follicular lymphoma, unspecified, extranodal and solid organ sites: Secondary | ICD-10-CM

## 2024-06-13 DIAGNOSIS — Z5112 Encounter for antineoplastic immunotherapy: Secondary | ICD-10-CM | POA: Diagnosis present

## 2024-06-13 LAB — CBC WITH DIFFERENTIAL/PLATELET
Abs Immature Granulocytes: 0.11 10*3/uL — ABNORMAL HIGH (ref 0.00–0.07)
Basophils Absolute: 0.1 10*3/uL (ref 0.0–0.1)
Basophils Relative: 1 %
Eosinophils Absolute: 0.4 10*3/uL (ref 0.0–0.5)
Eosinophils Relative: 4 %
HCT: 35.4 % — ABNORMAL LOW (ref 39.0–52.0)
Hemoglobin: 10.7 g/dL — ABNORMAL LOW (ref 13.0–17.0)
Immature Granulocytes: 1 %
Lymphocytes Relative: 26 %
Lymphs Abs: 3 10*3/uL (ref 0.7–4.0)
MCH: 26.9 pg (ref 26.0–34.0)
MCHC: 30.2 g/dL (ref 30.0–36.0)
MCV: 88.9 fL (ref 80.0–100.0)
Monocytes Absolute: 0.9 10*3/uL (ref 0.1–1.0)
Monocytes Relative: 8 %
Neutro Abs: 6.8 10*3/uL (ref 1.7–7.7)
Neutrophils Relative %: 60 %
Platelets: 331 10*3/uL (ref 150–400)
RBC: 3.98 MIL/uL — ABNORMAL LOW (ref 4.22–5.81)
RDW: 16.9 % — ABNORMAL HIGH (ref 11.5–15.5)
WBC: 11.3 10*3/uL — ABNORMAL HIGH (ref 4.0–10.5)
nRBC: 0 % (ref 0.0–0.2)

## 2024-06-13 LAB — COMPREHENSIVE METABOLIC PANEL WITH GFR
ALT: 21 U/L (ref 0–44)
AST: 31 U/L (ref 15–41)
Albumin: 3.3 g/dL — ABNORMAL LOW (ref 3.5–5.0)
Alkaline Phosphatase: 107 U/L (ref 38–126)
Anion gap: 14 (ref 5–15)
BUN: 15 mg/dL (ref 8–23)
CO2: 25 mmol/L (ref 22–32)
Calcium: 8.7 mg/dL — ABNORMAL LOW (ref 8.9–10.3)
Chloride: 103 mmol/L (ref 98–111)
Creatinine, Ser: 0.7 mg/dL (ref 0.61–1.24)
GFR, Estimated: >60 mL/min
Glucose, Bld: 165 mg/dL — ABNORMAL HIGH (ref 70–99)
Potassium: 4.3 mmol/L (ref 3.5–5.1)
Sodium: 141 mmol/L (ref 135–145)
Total Bilirubin: 0.3 mg/dL (ref 0.0–1.2)
Total Protein: 5.7 g/dL — ABNORMAL LOW (ref 6.5–8.1)

## 2024-06-13 LAB — CYTOLOGY - NON PAP

## 2024-06-13 LAB — SURGICAL PATHOLOGY

## 2024-06-13 LAB — MAGNESIUM: Magnesium: 1.8 mg/dL (ref 1.7–2.4)

## 2024-06-13 MED ORDER — SODIUM CHLORIDE 0.9 % IV SOLN
375.0000 mg/m2 | Freq: Once | INTRAVENOUS | Status: AC
  Administered 2024-06-13: 800 mg via INTRAVENOUS
  Filled 2024-06-13: qty 50

## 2024-06-13 MED ORDER — FAMOTIDINE IN NACL 20-0.9 MG/50ML-% IV SOLN
20.0000 mg | Freq: Once | INTRAVENOUS | Status: AC
  Administered 2024-06-13: 20 mg via INTRAVENOUS
  Filled 2024-06-13: qty 50

## 2024-06-13 MED ORDER — SODIUM CHLORIDE 0.9 % IV SOLN: INTRAVENOUS | Status: DC

## 2024-06-13 MED ORDER — DIPHENHYDRAMINE HCL 50 MG/ML IJ SOLN
25.0000 mg | Freq: Once | INTRAMUSCULAR | Status: AC
  Administered 2024-06-13: 25 mg via INTRAVENOUS
  Filled 2024-06-13: qty 1

## 2024-06-13 MED ORDER — METHYLPREDNISOLONE SODIUM SUCC 125 MG IJ SOLR
125.0000 mg | Freq: Once | INTRAMUSCULAR | Status: AC
  Administered 2024-06-13: 125 mg via INTRAVENOUS
  Filled 2024-06-13: qty 2

## 2024-06-13 MED ORDER — ACETAMINOPHEN 325 MG PO TABS
650.0000 mg | ORAL_TABLET | Freq: Once | ORAL | Status: AC
  Administered 2024-06-13: 650 mg via ORAL
  Filled 2024-06-13: qty 2

## 2024-06-13 NOTE — Patient Instructions (Signed)
 [facility] [place] - A DEPT OF [facility]  Discharge Instructions: Thank you for choosing [facility] to provide your oncology and hematology care.  If you have a lab appointment with the Cancer Center - please note that after April 8th, 2024, all labs will be drawn in the cancer center.  You do not have to check in or register with the main entrance as you have in the past but will complete your check-in in the cancer center.  Wear comfortable clothing and clothing appropriate for easy access to any Portacath or PICC line.   We strive to give you quality time with your provider. You may need to reschedule your appointment if you arrive late (15 or more minutes).  Arriving late affects you and other patients whose appointments are after yours.  Also, if you miss three or more appointments without notifying the office, you may be dismissed from the clinic at the provider's discretion.      For prescription refill requests, have your pharmacy contact our office and allow 72 hours for refills to be completed.    Today you received the following chemotherapy and/or immunotherapy agents rituxan.        To help prevent nausea and vomiting after your treatment, we encourage you to take your nausea medication as directed.  BELOW ARE SYMPTOMS THAT SHOULD BE REPORTED IMMEDIATELY: *FEVER GREATER THAN 100.4 F (38 C) OR HIGHER *CHILLS OR SWEATING *NAUSEA AND VOMITING THAT IS NOT CONTROLLED WITH YOUR NAUSEA MEDICATION *UNUSUAL SHORTNESS OF BREATH *UNUSUAL BRUISING OR BLEEDING *URINARY PROBLEMS (pain or burning when urinating, or frequent urination) *BOWEL PROBLEMS (unusual diarrhea, constipation, pain near the anus) TENDERNESS IN MOUTH AND THROAT WITH OR WITHOUT PRESENCE OF ULCERS (sore throat, sores in mouth, or a toothache) UNUSUAL RASH, SWELLING OR PAIN  UNUSUAL VAGINAL DISCHARGE OR ITCHING   Items with * indicate a potential emergency and should be followed  up as soon as possible or go to the Emergency Department if any problems should occur.  Please show the CHEMOTHERAPY ALERT CARD or IMMUNOTHERAPY ALERT CARD at check-in to the Emergency Department and triage nurse.  Should you have questions after your visit or need to cancel or reschedule your appointment, please contact [facility] [place] - A DEPT OF [facility] [phone]  and follow the prompts.  Office hours are 8:00 a.m. to 4:30 p.m. Monday - Friday. Please note that voicemails left after 4:00 p.m. may not be returned until the following business day.  We are closed weekends and major holidays. You have access to a nurse at all times for urgent questions. Please call the main number to the clinic [phone] and follow the prompts.  For any non-urgent questions, you may also contact your provider using MyChart. We now offer e-Visits for anyone 73 and older to request care online for non-urgent symptoms. For details visit [email or web address].   Also download the MyChart app! Go to the app store, search "MyChart", open the app, select [place], and log in with your MyChart username and password.

## 2024-06-13 NOTE — Progress Notes (Signed)
 Patient presents today for Ruxience  D1C5 infusion. Law work within parameters for treatment. Inpatient from 06-02-2024 to 06-07-2024 with diagnosis of sepsis, pneumonia of right lung, acute respiratory failure per discharge note. Heart rate 108 on arrival. Message sent to Dr. Davonna. Message received to proceed with treatment.

## 2024-06-13 NOTE — Progress Notes (Signed)
 Patients HR 108 today with verbal order ok to treat Dr. Davonna.

## 2024-06-14 ENCOUNTER — Other Ambulatory Visit: Payer: Self-pay | Admitting: Radiology

## 2024-06-18 ENCOUNTER — Other Ambulatory Visit: Payer: Self-pay

## 2024-06-18 ENCOUNTER — Other Ambulatory Visit (HOSPITAL_COMMUNITY): Payer: Self-pay | Admitting: Radiology

## 2024-06-18 MED ORDER — DEXTROSE 5 % IV SOLN: 2.0000 g | Freq: Three times a day (TID) | INTRAVENOUS | Status: AC

## 2024-06-19 ENCOUNTER — Other Ambulatory Visit: Payer: Self-pay | Admitting: Radiology

## 2024-06-19 ENCOUNTER — Ambulatory Visit (HOSPITAL_COMMUNITY): Admission: RE | Admit: 2024-06-19 | Discharge: 2024-06-19 | Attending: Physician Assistant

## 2024-06-19 ENCOUNTER — Encounter (HOSPITAL_COMMUNITY): Payer: Self-pay

## 2024-06-19 ENCOUNTER — Ambulatory Visit (HOSPITAL_COMMUNITY)
Admission: RE | Admit: 2024-06-19 | Discharge: 2024-06-19 | Disposition: A | Source: Ambulatory Visit | Attending: Diagnostic Radiology | Admitting: Diagnostic Radiology

## 2024-06-19 ENCOUNTER — Other Ambulatory Visit: Payer: Self-pay

## 2024-06-19 DIAGNOSIS — J90 Pleural effusion, not elsewhere classified: Secondary | ICD-10-CM | POA: Insufficient documentation

## 2024-06-19 DIAGNOSIS — Z87891 Personal history of nicotine dependence: Secondary | ICD-10-CM | POA: Insufficient documentation

## 2024-06-19 DIAGNOSIS — Z66 Do not resuscitate: Secondary | ICD-10-CM | POA: Insufficient documentation

## 2024-06-19 DIAGNOSIS — C8590 Non-Hodgkin lymphoma, unspecified, unspecified site: Secondary | ICD-10-CM | POA: Insufficient documentation

## 2024-06-19 LAB — GLUCOSE, CAPILLARY
Glucose-Capillary: 88 mg/dL (ref 70–99)
Glucose-Capillary: 89 mg/dL (ref 70–99)

## 2024-06-19 MED ORDER — CEFAZOLIN SODIUM-DEXTROSE 2-4 GM/100ML-% IV SOLN
2.0000 g | Freq: Once | INTRAVENOUS | Status: AC
  Administered 2024-06-19: 2 g via INTRAVENOUS

## 2024-06-19 MED ORDER — CEFAZOLIN SODIUM-DEXTROSE 2-4 GM/100ML-% IV SOLN
INTRAVENOUS | Status: AC
  Filled 2024-06-19: qty 100

## 2024-06-19 MED ORDER — LIDOCAINE-EPINEPHRINE 1 %-1:100000 IJ SOLN: 20.0000 mL | Freq: Once | INTRAMUSCULAR | Status: DC

## 2024-06-19 MED ORDER — MIDAZOLAM HCL 2 MG/2ML IJ SOLN
INTRAMUSCULAR | Status: AC
  Filled 2024-06-19: qty 2

## 2024-06-19 MED ORDER — LIDOCAINE-EPINEPHRINE (PF) 1 %-1:200000 IJ SOLN
20.0000 mL | Freq: Once | INTRAMUSCULAR | Status: AC
  Administered 2024-06-19: 20 mL

## 2024-06-19 MED ORDER — FENTANYL CITRATE (PF) 100 MCG/2ML IJ SOLN
INTRAMUSCULAR | Status: AC
  Filled 2024-06-19: qty 2

## 2024-06-19 MED ORDER — FENTANYL CITRATE (PF) 100 MCG/2ML IJ SOLN
INTRAMUSCULAR | Status: AC | PRN
  Administered 2024-06-19: 50 ug via INTRAVENOUS
  Administered 2024-06-19: 25 ug via INTRAVENOUS

## 2024-06-19 MED ORDER — SODIUM CHLORIDE 0.9 % IV SOLN: INTRAVENOUS | Status: DC

## 2024-06-19 MED ORDER — LIDOCAINE-EPINEPHRINE 1 %-1:100000 IJ SOLN
INTRAMUSCULAR | Status: AC
  Filled 2024-06-19: qty 1

## 2024-06-19 MED ORDER — MIDAZOLAM HCL (PF) 2 MG/2ML IJ SOLN
INTRAMUSCULAR | Status: AC | PRN
  Administered 2024-06-19: 1 mg via INTRAVENOUS
  Administered 2024-06-19: .5 mg via INTRAVENOUS

## 2024-06-19 NOTE — Procedures (Signed)
 Interventional Radiology Procedure:   Indications: Large recurrent right pleural effusion.  Lymphoma  Procedure: Placement of right chest Pleurx catheter  Findings: Large right pleural effusion is very loculated.  Catheter placed in largest pocket and 1.2 liters of bloody fluid removed.    Complications: None     EBL: Minimal  Plan: Discharge in 1 hour.  Pleurx catheter is ready to used as needed.    Larinda Herter R. Philip, MD  Pager: [phone]

## 2024-06-19 NOTE — H&P (Signed)
 "     Chief Complaint: Patient was seen in consultation today for Right recurrent pleural effusion; PleurX catheter placement  at the request of Dr VEAR Dry  Supervising Physician: Philip Cornet  Patient Status: [name] - Out-pt  History of Present Illness: Cody Palmer is a 89 y.o. male   DNR Code status per pt  Hx NHL Follows with Dr Dry Recurrent Rt pleural effusion Multiple thoracentesis; Last thora: 06/07/24 1.7 L  Scheduled today for Rt Chest PleurX catheter placement  Oncology to follow and manage Rt PleurX catheter Pt and Dtr to see Oncology tomorrow    Past Medical History:  Diagnosis Date   Arthritis    Cancer [facility])    Skin cancer   Diabetes mellitus without complication (HCC)    HTN (hypertension)    Hypercholesteremia     Past Surgical History:  Procedure Laterality Date   CATARACT EXTRACTION W/PHACO Left 04/25/2016   Procedure: CATARACT EXTRACTION PHACO AND INTRAOCULAR LENS PLACEMENT LEFT EYE;  Surgeon: Cherene Mania, MD;  Location: [facility] ORS;  Service: Ophthalmology;  Laterality: Left;  CDE: 10.37   COLONOSCOPY N/A 04/05/2024   Procedure: COLONOSCOPY;  Surgeon: Cindie Carlin POUR, DO;  Location: [facility] ENDO SUITE;  Service: Endoscopy;  Laterality: N/A;   CYSTOSCOPY WITH BIOPSY N/A 08/13/2013   Procedure: CYSTOSCOPY WITH BLADDER BIOPSY;  Surgeon: Garnette Shack, MD;  Location: [facility] ORS;  Service: Urology;  Laterality: N/A;   ENUCLEATION Right    Removed in 1958   EYE SURGERY  1958   removal of eye   TOTAL KNEE ARTHROPLASTY Right 01/11/2013   Procedure: RIGHT TOTAL KNEE ARTHROPLASTY;  Surgeon: Taft FORBES Minerva, MD;  Location: [facility] ORS;  Service: Orthopedics;  Laterality: Right;   TOTAL KNEE ARTHROPLASTY Left 07/12/2022   Procedure: TOTAL KNEE ARTHROPLASTY;  Surgeon: Minerva Taft FORBES, MD;  Location: [facility] ORS;  Service: Orthopedics;  Laterality: Left;    Allergies: Rituximab   Medications: Prior to Admission medications  Medication Sig Start Date End Date  Taking? Authorizing Provider  acetaminophen  (TYLENOL ) 325 MG tablet Take 2 tablets (650 mg total) by mouth every 6 (six) hours as needed for mild pain (pain score 1-3), fever or headache (or Fever >/= 101). 06/07/24  Yes Emokpae, Courage, MD  allopurinol  (ZYLOPRIM ) 300 MG tablet Take 0.5 tablets (150 mg total) by mouth daily. 05/23/24  Yes Landy Barnie RAMAN, NP  aspirin  EC 81 MG tablet Take 1 tablet (81 mg total) by mouth daily with breakfast. Swallow whole. 06/07/24  Yes Emokpae, Courage, MD  Cholecalciferol  (VITAMIN D3) 25 MCG (1000 UT) CAPS Take 1,000 Units by mouth daily.   Yes [provider]  fluticasone  (FLONASE ) 50 MCG/ACT nasal spray Place 2 sprays into both nostrils daily. 04/25/24  Yes Ricky Fines, MD  furosemide  (LASIX ) 40 MG tablet Take 1 tablet (40 mg total) by mouth in the morning. 06/07/24  Yes Emokpae, Courage, MD  loratadine  (CLARITIN ) 10 MG tablet Take 1 tablet (10 mg total) by mouth daily. 04/25/24  Yes Ricky Fines, MD  metformin  (FORTAMET ) 500 MG (OSM) 24 hr tablet Take 1 tablet (500 mg total) by mouth 2 (two) times daily with a meal. 06/07/24  Yes Emokpae, Courage, MD  metoprolol  succinate (TOPROL  XL) 25 MG 24 hr tablet Take 1 tablet (25 mg total) by mouth daily. 06/07/24 06/07/25 Yes Emokpae, Courage, MD  Multiple Vitamins-Minerals (EYE HEALTH AREDS 2 PO) Take 1 capsule by mouth 2 (two) times daily.   Yes [provider]  omeprazole  (PRILOSEC) 40 MG  capsule Take 1 capsule (40 mg total) by mouth daily. 06/07/24  Yes Pearlean Manus, MD  prochlorperazine  (COMPAZINE ) 10 MG tablet Take 1 tablet (10 mg total) by mouth every 6 (six) hours as needed for nausea or vomiting. 05/23/24  Yes Landy Barnie RAMAN, NP  rosuvastatin  (CRESTOR ) 10 MG tablet Take 1 tablet (10 mg total) by mouth every other day. 05/23/24  Yes Landy Barnie RAMAN, NP  simethicone  (MYLICON) 80 MG chewable tablet Chew 1 tablet (80 mg total) by mouth every 6 (six) hours as needed for flatulence. 04/06/24  Yes Shah,  Pratik D, DO  JARDIANCE  25 MG TABS tablet Take 25 mg by mouth daily. Patient not taking: Reported on 06/04/2024 05/30/24   [provider]     Family History  Problem Relation Age of Onset   Arthritis Unknown    Cancer Unknown    Diabetes Unknown     Social History   Socioeconomic History   Marital status: Widowed    Spouse name: Not on file   Number of children: Not on file   Years of education: Not on file   Highest education level: Not on file  Occupational History   Not on file  Tobacco Use   Smoking status: Former    Current packs/day: 0.00    Average packs/day: 1 pack/day for 8.0 years (8.0 ttl pk-yrs)    Types: Cigarettes    Start date: 01/07/1957    Quit date: 01/07/1965    Years since quitting: 59.4   Smokeless tobacco: Never  Vaping Use   Vaping status: Never Used  Substance and Sexual Activity   Alcohol use: No   Drug use: No   Sexual activity: Yes    Birth control/protection: None  Other Topics Concern   Not on file  Social History Narrative   Not on file   Social Drivers of Health   Tobacco Use: Medium Risk (06/19/2024)   Patient History    Smoking Tobacco Use: Former    Smokeless Tobacco Use: Never    Passive Exposure: Not on Actuary Strain: Not on file  Food Insecurity: No Food Insecurity (06/02/2024)   Epic    Worried About Programme Researcher, Broadcasting/film/video in the Last Year: Never true    Ran Out of Food in the Last Year: Never true  Transportation Needs: No Transportation Needs (06/02/2024)   Epic    Lack of Transportation (Medical): No    Lack of Transportation (Non-Medical): No  Physical Activity: Not on file  Stress: Not on file  Social Connections: Unknown (06/02/2024)   Social Connection and Isolation Panel    Frequency of Communication with Friends and Family: More than three times a week    Frequency of Social Gatherings with Friends and Family: More than three times a week    Attends Religious Services: Patient declined     Database Administrator or Organizations: Patient declined    Attends Banker Meetings: Patient declined    Marital Status: Widowed  Recent Concern: Social Connections - Moderately Isolated (04/10/2024)   Social Connection and Isolation Panel    Frequency of Communication with Friends and Family: More than three times a week    Frequency of Social Gatherings with Friends and Family: More than three times a week    Attends Religious Services: 1 to 4 times per year    Active Member of [employer] or Organizations: No    Attends Banker Meetings: Never  Marital Status: Widowed  Depression (PHQ2-9): Low Risk (06/13/2024)   Depression (PHQ2-9)    PHQ-2 Score: 0  Alcohol Screen: Not on file  Housing: Low Risk (06/02/2024)   Epic    Unable to Pay for Housing in the Last Year: No    Number of Times Moved in the Last Year: 1    Homeless in the Last Year: No  Utilities: Not At Risk (06/02/2024)   Epic    Threatened with loss of utilities: No  Health Literacy: Not on file    Review of Systems: A 12 point ROS discussed and pertinent positives are indicated in the HPI above.  All other systems are negative.  Review of Systems  Constitutional:  Positive for fatigue. Negative for activity change.  HENT:  Positive for hearing loss.   Respiratory:  Positive for shortness of breath. Negative for cough.   Cardiovascular:  Negative for chest pain.  Neurological:  Positive for weakness.  Psychiatric/Behavioral:  Negative for behavioral problems and confusion.     Vital Signs: BP (!) 143/71   Pulse (!) 107   Temp 98.9 F (37.2 C) (Oral)   Resp 18   Ht 5' 8 (1.727 m)   Wt 171 lb (77.6 kg)   SpO2 93%   BMI 26.00 kg/m   Advance Care Plan: The advanced care plan/surrogate decision maker was discussed at the time of visit and documented in the medical record.    Physical Exam Vitals reviewed.  HENT:     Mouth/Throat:     Mouth: Mucous membranes are moist.   Cardiovascular:     Rate and Rhythm: Normal rate and regular rhythm.     Heart sounds: No murmur heard. Pulmonary:     Effort: Pulmonary effort is normal.     Comments: Diminished sounds Rt Abdominal:     Palpations: Abdomen is soft.  Musculoskeletal:        General: Normal range of motion.  Skin:    General: Skin is warm.  Neurological:     Mental Status: He is alert and oriented to person, place, and time.  Psychiatric:        Behavior: Behavior normal.     Imaging: DG Chest 2 View Result Date: 06/13/2024 EXAM: 2 VIEW(S) XRAY OF THE CHEST 06/13/2024 04:04:00 PM COMPARISON: 06/07/2024 CLINICAL HISTORY: Pleural effusion. FINDINGS: LUNGS AND PLEURA: Moderate to large right pleural effusion without significant interval change. Right base consolidation or volume loss. No focal pulmonary opacity. No pneumothorax. HEART AND MEDIASTINUM: Calcified aorta. No acute abnormality of the cardiac and mediastinal silhouettes. BONES AND SOFT TISSUES: Thoracic degenerative changes. IMPRESSION: 1. Moderate to large right pleural effusion without significant interval change. 2. Right base consolidation or volume loss. Electronically signed by: Oneil Devonshire MD 06/13/2024 05:10 PM EST RP Workstation: [id number]   US  THORACENTESIS RIGHT ASP PLEURAL SPACE W/IMG GUIDE Result Date: 06/07/2024 INDICATION: [id number] Pleural effusion [id number] Patient with history of lymphoma, recurrent right pleural effusion. Request for diagnostic and therapeutic right thoracentesis. EXAM: ULTRASOUND GUIDED RIGHT THORACENTESIS MEDICATIONS: 5 mL 1% lidocaine  COMPLICATIONS: None immediate. PROCEDURE: An ultrasound guided thoracentesis was thoroughly discussed with the patient and questions answered. The benefits, risks, alternatives and complications were also discussed. The patient understands and wishes to proceed with the procedure. Written consent was obtained. Ultrasound was performed to localize and mark an adequate pocket of fluid in  the right chest. The area was then prepped and draped in the normal sterile fashion. 1% Lidocaine  was used for local anesthesia.  Under ultrasound guidance a 6 Fr Safe-T-Centesis catheter was introduced. Thoracentesis was performed. The catheter was removed and a dressing applied. FINDINGS: A total of approximately 1.7 L of serosanguineous fluid was removed. Samples were sent to the laboratory as requested by the clinical team. IMPRESSION: Successful ultrasound guided RIGHT thoracentesis yielding 1.7 L of pleural fluid. Performed by Clotilda Hesselbach, PA-C under supervision of Thom Hall, MD Electronically Signed   By: Thom Hall M.D.   On: 06/07/2024 14:26   DG Chest Portable 1 View Result Date: 06/07/2024 CLINICAL DATA:  Status post thoracentesis EXAM: PORTABLE CHEST 1 VIEW COMPARISON:  Same day FINDINGS: Right pleural effusion is slightly decreased in size compared to prior exam. No pneumothorax is noted. IMPRESSION: Slightly decreased size of right pleural effusion. No pneumothorax is noted. Electronically Signed   By: Lynwood Landy Raddle M.D.   On: 06/07/2024 11:24   DG CHEST PORT 1 VIEW Result Date: 06/07/2024 CLINICAL DATA:  Shortness of breath, pleural effusion EXAM: PORTABLE CHEST 1 VIEW COMPARISON:  Two days ago FINDINGS: Stable cardiomediastinal silhouette. Moderate size right pleural effusion is noted. Mild central pulmonary vascular congestion is noted. Bony thorax is unremarkable. IMPRESSION: Moderate size right pleural effusion. Mild central pulmonary vascular congestion. Electronically Signed   By: Lynwood Landy Raddle M.D.   On: 06/07/2024 07:50   US  THORACENTESIS RIGHT ASP PLEURAL SPACE W/IMG GUIDE Result Date: 06/05/2024 INDICATION: 89 year old male. History of lymphoma with recurrent right-sided pleural effusion. Request is for therapeutic right-sided thoracentesis. EXAM: ULTRASOUND GUIDED THERAPEUTIC RIGHT-SIDED THORACENTESIS MEDICATIONS: Lidocaine  1% 10 mL COMPLICATIONS: None immediate.  PROCEDURE: An ultrasound guided thoracentesis was thoroughly discussed with the patient and questions answered. The benefits, risks, alternatives and complications were also discussed. The patient understands and wishes to proceed with the procedure. Written consent was obtained. Ultrasound was performed to localize and mark an adequate pocket of fluid in the right chest. The area was then prepped and draped in the normal sterile fashion. 1% Lidocaine  was used for local anesthesia. Under ultrasound guidance a 19 gauge, 7-cm, Yueh catheter was introduced. Thoracentesis was performed. The catheter was removed and a dressing applied. FINDINGS: A total of approximately 2.9 L of serous colored fluid was removed. IMPRESSION: Successful ultrasound guided therapeutic right-sided thoracentesis yielding 2.9 of serous colored pleural fluid. Performed by Delon Beagle NP Electronically Signed   By: Ester Sides M.D.   On: 06/05/2024 09:12   DG Chest Portable 1 View Result Date: 06/05/2024 CLINICAL DATA:  Post right-sided thoracentesis. EXAM: PORTABLE CHEST 1 VIEW COMPARISON:  06/03/2024 FINDINGS: Lungs are hypoinflated demonstrate a moderate to large right pleural effusion with improvement post thoracentesis. Better aeration over the right upper lobe/apex. Likely compressive atelectasis over the right base. No pneumothorax. Left lung is clear. Cardiomediastinal silhouette and remainder of the exam is unchanged. IMPRESSION: Moderate to large right pleural effusion with improvement post thoracentesis. No pneumothorax. Electronically Signed   By: Toribio Agreste M.D.   On: 06/05/2024 09:12   NM PET Image Restag (PS) Skull Base To Thigh Result Date: 06/04/2024 CLINICAL DATA:  Subsequent treatment strategy for follicular lymphoma. EXAM: NUCLEAR MEDICINE PET SKULL BASE TO THIGH TECHNIQUE: 9.3 mCi F-18 FDG was injected intravenously. Full-ring PET imaging was performed from the skull base to thigh after the radiotracer. CT  data was obtained and used for attenuation correction and anatomic localization. Fasting blood glucose: 137 mg/dl COMPARISON:  PET-CT 87/81/7974 FINDINGS: Mediastinal blood pool activity: SUV max 1.9 Liver activity: SUV max 3.0 NECK: No residual hypermetabolic  lymph nodes in the neck (Deauville 1). Incidental CT findings: Stable bilateral carotid artery calcifications. CHEST: No residual supraclavicular or left subclavicular adenopathy. No residual hypermetabolic mediastinal or hilar nodes. No residual hypermetabolic epicardial nodes. The extensive hypermetabolic posterior mediastinal adenopathy seen on the prior study has significantly improved. There are small residual para esophageal nodes which have an SUV max of 6.8. This was previously 11.0. There is residual paraspinal/pleural hypermetabolism, right greater left. SUV max on right is 5.6. This was previously 3.8. No hypermetabolic pulmonary nodules. Incidental CT findings: Significant interval enlargement of the right pleural effusion. ABDOMEN/PELVIS: Significant improvement in the diffuse and widespread disease in the abdomen and pelvis. Some residual lymph nodes in the gastrohepatic ligament (SUV max 5.0) and periportal regions (SUV max 7.5) (Deauville 4). Small residual mesenteric and retroperitoneal lymph nodes but no hypermetabolism (Deauville 1). No residual pelvic or inguinal adenopathy (Deauville 1). Incidental CT findings: Stable vascular disease. Ill-defined matted soft tissue density throughout the mesentery and retroperitoneal consistent with treated lymphoma. Small volume abdominal/pelvic ascites. Bladder diverticuli noted. SKELETON: No residual hypermetabolic bone lesions.  (Deauville 1). Incidental CT findings: None. IMPRESSION: 1. Significant interval improvement in the diffuse and widespread disease in the neck, chest, abdomen and pelvis. There are small residual hypermetabolic lymph nodes in the posterior mediastinum, gastrohepatic ligament  and periportal regions (Deauville 4). 2. No residual hypermetabolic bone lesions. 3. Significant interval enlargement of the right pleural effusion. 4. Small volume abdominal/pelvic ascites. 5. Aortic atherosclerosis. Aortic Atherosclerosis (ICD10-I70.0). Electronically Signed   By: MYRTIS Stammer M.D.   On: 06/04/2024 23:30   US  THORACENTESIS RIGHT ASP PLEURAL SPACE W/IMG GUIDE Result Date: 06/03/2024 INDICATION: [id number] Pleural effusion 142230 Patient with history of lymphoma, recurrent ascites. Found to have new right pleural effusion on recent imaging. Request for diagnostic and therapeutic right thoracentesis. EXAM: ULTRASOUND GUIDED RIGHT THORACENTESIS MEDICATIONS: 5 mL 1% lidocaine  COMPLICATIONS: None immediate. PROCEDURE: An ultrasound guided thoracentesis was thoroughly discussed with the patient and questions answered. The benefits, risks, alternatives and complications were also discussed. The patient understands and wishes to proceed with the procedure. Written consent was obtained. Ultrasound was performed to localize and mark an adequate pocket of fluid in the right chest. The area was then prepped and draped in the normal sterile fashion. 1% Lidocaine  was used for local anesthesia. Under ultrasound guidance a 6 Fr Safe-T-Centesis catheter was introduced. Thoracentesis was performed. The catheter was removed and a dressing applied. FINDINGS: A total of approximately 1.5 L of bloody fluid was removed. Procedure was stopped at this amount due to first-time thoracentesis limit, patient with significant residual pleural effusion on post procedure ultrasound. Samples were sent to the laboratory as requested by the clinical team. IMPRESSION: Successful ultrasound guided RIGHT thoracentesis yielding 1.5 L of pleural fluid. Performed by Clotilda Hesselbach, PA-C under supervision of Thom Hall, MD Electronically Signed   By: Thom Hall M.D.   On: 06/03/2024 16:31   DG Chest Port 1 View Result Date:  06/03/2024 CLINICAL DATA:  Post thoracentesis EXAM: PORTABLE CHEST 1 VIEW COMPARISON:  06/02/2024, chest CT 06/02/2024 FINDINGS: Persistent large right-sided pleural effusion with near complete opacification of right thorax, small amount of aerated right apex. No gross pneumothorax, suspect soft tissue or skin artifact over the right apex and lateral aspect of the chest. Probable cardiomegaly. Suspect small left effusion. Increasing airspace disease at left base. Aortic atherosclerosis. IMPRESSION: 1. Persistent large right-sided pleural effusion with near complete opacification of right thorax. No gross pneumothorax. 2. Suspect small left  effusion with increasing airspace disease at the left base. Electronically Signed   By: [name] Bun M.D.   On: 06/03/2024 15:26   CT Angio Chest Pulmonary Embolism (PE) W or WO Contrast Result Date: 06/02/2024 EXAM: CTA CHEST PE WITHOUT AND WITH CONTRAST CT ABDOMEN AND PELVIS WITHOUT AND WITH CONTRAST 06/02/2024 09:43:31 AM TECHNIQUE: CTA of the chest was performed after the administration of intravenous contrast. Multiplanar reformatted images are provided for review. MIP images are provided for review. CT of the abdomen and pelvis was performed without and with the administration of intravenous contrast. Automated exposure control, iterative reconstruction, and/or weight based adjustment of the mA/kV was utilized to reduce the radiation dose to as low as reasonably achievable. COMPARISON: PET/CT 05/02/2024. CLINICAL HISTORY: Lymphoma. Patient presents with shortness of breath and abdominal distention. FINDINGS: CHEST: PULMONARY ARTERIES: Pulmonary arteries are adequately opacified for evaluation. No intraluminal filling defect to suggest pulmonary embolism. Main pulmonary artery is normal in caliber. MEDIASTINUM: No mediastinal lymphadenopathy. No axillary or supraclavicular adenopathy. Previous right costophrenic (CP) angle lymph node measures 1.5 cm (image 101/5),  previously 1.6 cm. The heart and pericardium demonstrate no acute abnormality. Aortic atherosclerosis and coronary artery calcification. There is no acute abnormality of the thoracic aorta. LUNGS AND PLEURA: There is a large right pleural effusion which is significantly increased from previous exam with near complete atelectasis of the right lung with significantly reduced aeration to the right middle lobe, right upper lobe, and right lower lobe. Diffuse hazy ground-glass opacities are noted within the left lung, with a few scattered areas of air trapping in the lower lung zone, suggesting small airways disease. No pneumothorax. SOFT TISSUES AND BONES: No acute bone or soft tissue abnormality. ABDOMEN AND PELVIS: LIVER: Posterior the right lobe of the liver cyst is unchanged, measuring 1.4 cm. A focal hemangioma is noted within the anterior right hepatic lobe, unchanged measuring 2.1 cm. GALLBLADDER AND BILE DUCTS: Tiny stone noted within the gallbladder measuring 3 mm. Gallbladder wall thickening measures 5 mm in thickness. Small amount of pericholecystic fluid. No biliary ductal dilatation. SPLEEN: The spleen is within normal limits in size and appearance. PANCREAS: The pancreas is normal in size and contour without a focal lesion or ductal dilatation. ADRENAL GLANDS: Normal size and morphology bilaterally. No nodule, thickening, or hemorrhage. No periadrenal stranding. KIDNEYS, URETERS AND BLADDER: Bilateral pelvocaliectasis. No obstructing stones or mass noted. No perinephric or periureteral stranding. Bladder diverticula noted right dome of bladder diverticula measures 3.9 cm. GI AND BOWEL: Stomach appears normal. There is no pathologic dilatation of the large or small bowel loops. There is wall thickening involving the right lower quadrant small bowel loops, which have wall thickness measuring up to 7 mm. Wall thickening involving the cecum, ascending, and proximal transverse colon are identified which  subjectively may be improved from 04/15/2024. Wall thickening with surrounding soft tissue stranding is noted involving the left colon from the splenic flexure to the mid sigmoid colon. Extensive left-sided colonic diverticulosis without signs of acute diverticulitis. REPRODUCTIVE: Reproductive organs are unremarkable. PERITONEUM AND RETROPERITONEUM: Soft tissue infiltration which involves the root of mesentery and extending into the right lower quadrant. Ileocolic mesentery is again noted but appears improved in the interval. The central mesenteric tumor measures 8.1 x 2.2 cm (image 50/2), on the previous exam this measured 14.5 x 6.5 cm. Tumor within the right lower quadrant ileocolic mesentery encasing the vascular pedicle for the right lower quadrant small bowel loops and right colon has also decreased in the  interval. This measures 6.8 x 3.9 cm (image 57/2), previously 10.9 x 9.1 cm. There is a small volume moderate ascites overlying the liver. Compared with the prior imaging the volume of ascites within the abdomen and pelvis is decreased in the interval with resolution of free fluid within the pelvis. There are no signs of bowel perforation or abscess. No free air. LYMPH NODES: Left periaortic lymph node measures 1.2 cm (image 42/2). Previously 2.3 cm. BONES AND SOFT TISSUES: Lumbar degenerative disc disease. No acute abnormality of the visualized bones. No focal soft tissue abnormality. IMPRESSION: 1. No evidence of acute pulmonary embolism. 2. Large right pleural effusion, significantly increased from prior, with near complete right lung atelectasis, likely accounting for shortness of breath; consider malignant effusion (given lymphoma), heart failure, or infection, and consider diagnostic and therapeutic thoracentesis with follow-up chest imaging after drainage. 3. Diffuse hazy left lung ground-glass opacities with scattered air trapping, which may reflect pulmonary edema and/or small airways disease. 4.  Small-to-moderate ascites, decreased from prior, which may contribute to abdominal distention. 5. Interval decrease in mesenteric tumor burden and retroperitoneal/porta hepatis adenopathy, compatible with treatment response; continue oncologic follow-up imaging per lymphoma protocol. 6. Persistent bowel wall thickening involving right lower quadrant small bowel and colon with surrounding stranding, overall subjectively improved on the right, with differential including enterocolitis, or lymphomatous involvement. 7. No signs of bowel obstruction, perforation, or abscess formation. 8. Wall thickening and pericholecystic fluid involving the gallbladder. This is nonspecific in the setting of ascites. Correlate for any clinical signs or symptoms of cholecystitis. Electronically signed by: Waddell Calk MD 06/02/2024 10:34 AM EST RP Workstation: [id number]   CT ABDOMEN PELVIS W CONTRAST Result Date: 06/02/2024 EXAM: CTA CHEST PE WITHOUT AND WITH CONTRAST CT ABDOMEN AND PELVIS WITHOUT AND WITH CONTRAST 06/02/2024 09:43:31 AM TECHNIQUE: CTA of the chest was performed after the administration of intravenous contrast. Multiplanar reformatted images are provided for review. MIP images are provided for review. CT of the abdomen and pelvis was performed without and with the administration of intravenous contrast. Automated exposure control, iterative reconstruction, and/or weight based adjustment of the mA/kV was utilized to reduce the radiation dose to as low as reasonably achievable. COMPARISON: PET/CT 05/02/2024. CLINICAL HISTORY: Lymphoma. Patient presents with shortness of breath and abdominal distention. FINDINGS: CHEST: PULMONARY ARTERIES: Pulmonary arteries are adequately opacified for evaluation. No intraluminal filling defect to suggest pulmonary embolism. Main pulmonary artery is normal in caliber. MEDIASTINUM: No mediastinal lymphadenopathy. No axillary or supraclavicular adenopathy. Previous right costophrenic  (CP) angle lymph node measures 1.5 cm (image 101/5), previously 1.6 cm. The heart and pericardium demonstrate no acute abnormality. Aortic atherosclerosis and coronary artery calcification. There is no acute abnormality of the thoracic aorta. LUNGS AND PLEURA: There is a large right pleural effusion which is significantly increased from previous exam with near complete atelectasis of the right lung with significantly reduced aeration to the right middle lobe, right upper lobe, and right lower lobe. Diffuse hazy ground-glass opacities are noted within the left lung, with a few scattered areas of air trapping in the lower lung zone, suggesting small airways disease. No pneumothorax. SOFT TISSUES AND BONES: No acute bone or soft tissue abnormality. ABDOMEN AND PELVIS: LIVER: Posterior the right lobe of the liver cyst is unchanged, measuring 1.4 cm. A focal hemangioma is noted within the anterior right hepatic lobe, unchanged measuring 2.1 cm. GALLBLADDER AND BILE DUCTS: Tiny stone noted within the gallbladder measuring 3 mm. Gallbladder wall thickening measures 5 mm in thickness. Small  amount of pericholecystic fluid. No biliary ductal dilatation. SPLEEN: The spleen is within normal limits in size and appearance. PANCREAS: The pancreas is normal in size and contour without a focal lesion or ductal dilatation. ADRENAL GLANDS: Normal size and morphology bilaterally. No nodule, thickening, or hemorrhage. No periadrenal stranding. KIDNEYS, URETERS AND BLADDER: Bilateral pelvocaliectasis. No obstructing stones or mass noted. No perinephric or periureteral stranding. Bladder diverticula noted right dome of bladder diverticula measures 3.9 cm. GI AND BOWEL: Stomach appears normal. There is no pathologic dilatation of the large or small bowel loops. There is wall thickening involving the right lower quadrant small bowel loops, which have wall thickness measuring up to 7 mm. Wall thickening involving the cecum, ascending, and  proximal transverse colon are identified which subjectively may be improved from 04/15/2024. Wall thickening with surrounding soft tissue stranding is noted involving the left colon from the splenic flexure to the mid sigmoid colon. Extensive left-sided colonic diverticulosis without signs of acute diverticulitis. REPRODUCTIVE: Reproductive organs are unremarkable. PERITONEUM AND RETROPERITONEUM: Soft tissue infiltration which involves the root of mesentery and extending into the right lower quadrant. Ileocolic mesentery is again noted but appears improved in the interval. The central mesenteric tumor measures 8.1 x 2.2 cm (image 50/2), on the previous exam this measured 14.5 x 6.5 cm. Tumor within the right lower quadrant ileocolic mesentery encasing the vascular pedicle for the right lower quadrant small bowel loops and right colon has also decreased in the interval. This measures 6.8 x 3.9 cm (image 57/2), previously 10.9 x 9.1 cm. There is a small volume moderate ascites overlying the liver. Compared with the prior imaging the volume of ascites within the abdomen and pelvis is decreased in the interval with resolution of free fluid within the pelvis. There are no signs of bowel perforation or abscess. No free air. LYMPH NODES: Left periaortic lymph node measures 1.2 cm (image 42/2). Previously 2.3 cm. BONES AND SOFT TISSUES: Lumbar degenerative disc disease. No acute abnormality of the visualized bones. No focal soft tissue abnormality. IMPRESSION: 1. No evidence of acute pulmonary embolism. 2. Large right pleural effusion, significantly increased from prior, with near complete right lung atelectasis, likely accounting for shortness of breath; consider malignant effusion (given lymphoma), heart failure, or infection, and consider diagnostic and therapeutic thoracentesis with follow-up chest imaging after drainage. 3. Diffuse hazy left lung ground-glass opacities with scattered air trapping, which may reflect  pulmonary edema and/or small airways disease. 4. Small-to-moderate ascites, decreased from prior, which may contribute to abdominal distention. 5. Interval decrease in mesenteric tumor burden and retroperitoneal/porta hepatis adenopathy, compatible with treatment response; continue oncologic follow-up imaging per lymphoma protocol. 6. Persistent bowel wall thickening involving right lower quadrant small bowel and colon with surrounding stranding, overall subjectively improved on the right, with differential including enterocolitis, or lymphomatous involvement. 7. No signs of bowel obstruction, perforation, or abscess formation. 8. Wall thickening and pericholecystic fluid involving the gallbladder. This is nonspecific in the setting of ascites. Correlate for any clinical signs or symptoms of cholecystitis. Electronically signed by: Waddell Calk MD 06/02/2024 10:34 AM EST RP Workstation: [id number]   DG Chest 1 View Result Date: 06/02/2024 EXAM: 1 VIEW(S) XRAY OF THE CHEST 06/02/2024 09:08:49 AM COMPARISON: PET CT 05/30/2024. CLINICAL HISTORY: sob FINDINGS: LUNGS AND PLEURA: Large right pleural effusion with atelectatic changes involving the right lung is unchanged from PET/CT dated 10/28/2024. Left lung appears clear. No pneumothorax. HEART AND MEDIASTINUM: Aortic atherosclerosis. Stable cardiomediastinal contours. BONES AND SOFT TISSUES: No acute osseous  abnormality. IMPRESSION: 1. Large right pleural effusion with atelectatic changes involving the right lung. Electronically signed by: Waddell Calk MD 06/02/2024 09:42 AM EST RP Workstation: [id number]    Labs:  CBC: Recent Labs    06/04/24 0452 06/05/24 0422 06/07/24 0413 06/13/24 0949  WBC 13.3* 13.2* 10.4 11.3*  HGB 11.2* 10.7* 10.0* 10.7*  HCT 36.7* 35.1* 32.6* 35.4*  PLT 284 274 264 331    COAGS: Recent Labs    05/07/24 0436 06/02/24 0842  INR 1.0 1.1    BMP: Recent Labs    06/04/24 0452 06/05/24 0422 06/07/24 0413  06/13/24 0949  NA 141 139 139 141  K 3.7 3.3* 3.5 4.3  CL 100 98 99 103  CO2 29 33* 33* 25  GLUCOSE 113* 113* 108* 165*  BUN 25* 24* 20 15  CALCIUM  8.3* 8.1* 7.9* 8.7*  CREATININE 0.95 1.06 0.91 0.70  GFRNONAA >60 >60 >60 >60    LIVER FUNCTION TESTS: Recent Labs    06/03/24 0422 06/04/24 0452 06/07/24 0413 06/07/24 1103 06/13/24 0949  BILITOT 0.5 0.5  --  0.3 0.3  AST 19 21  --  19 31  ALT 14 10  --  11 21  ALKPHOS 122 114  --  93 107  PROT 5.4* 5.1*  --  4.9* 5.7*  ALBUMIN  3.2* 2.9* 2.6* 2.7* 3.3*    TUMOR MARKERS: No results for input(s): AFPTM, CEA, CA199, CHROMGRNA in the last 8760 hours.  Assessment and Plan:  Scheduled for Right Chest PleurX cathter placement in  IR Risks and benefits discussed with the patient including bleeding, infection, damage to adjacent structures, malfunction of the catheter with need for additional procedures.  All of the patient's questions were answered, patient is agreeable to proceed. Consent signed and in chart.  Thank you for this interesting consult.  I greatly enjoyed meeting Cody Palmer and look forward to participating in their care.  A copy of this report was sent to the requesting provider on this date.  Electronically Signed: Sharlet DELENA Candle, PA-C 06/19/2024, 11:05 AM   I spent a total of  30 Minutes   in face to face in clinical consultation, greater than 50% of which was counseling/coordinating care for Rt chest pleurX catheter placement "

## 2024-06-20 ENCOUNTER — Other Ambulatory Visit: Payer: Self-pay | Admitting: Oncology

## 2024-06-20 ENCOUNTER — Inpatient Hospital Stay: Attending: Oncology

## 2024-06-20 ENCOUNTER — Inpatient Hospital Stay

## 2024-06-20 VITALS — BP 134/58 | HR 91 | Temp 98.6°F | Resp 18 | Wt 171.6 lb

## 2024-06-20 DIAGNOSIS — C8299 Follicular lymphoma, unspecified, extranodal and solid organ sites: Secondary | ICD-10-CM

## 2024-06-20 LAB — COMPREHENSIVE METABOLIC PANEL WITH GFR
ALT: 12 U/L (ref 0–44)
AST: 27 U/L (ref 15–41)
Albumin: 3.2 g/dL — ABNORMAL LOW (ref 3.5–5.0)
Alkaline Phosphatase: 98 U/L (ref 38–126)
Anion gap: 15 (ref 5–15)
BUN: 16 mg/dL (ref 8–23)
CO2: 24 mmol/L (ref 22–32)
Calcium: 8.5 mg/dL — ABNORMAL LOW (ref 8.9–10.3)
Chloride: 102 mmol/L (ref 98–111)
Creatinine, Ser: 0.9 mg/dL (ref 0.61–1.24)
GFR, Estimated: >60 mL/min
Glucose, Bld: 153 mg/dL — ABNORMAL HIGH (ref 70–99)
Potassium: 3.8 mmol/L (ref 3.5–5.1)
Sodium: 141 mmol/L (ref 135–145)
Total Bilirubin: 0.3 mg/dL (ref 0.0–1.2)
Total Protein: 5.7 g/dL — ABNORMAL LOW (ref 6.5–8.1)

## 2024-06-20 LAB — MAGNESIUM: Magnesium: 1.7 mg/dL (ref 1.7–2.4)

## 2024-06-20 LAB — CBC WITH DIFFERENTIAL/PLATELET
Abs Immature Granulocytes: 0.1 10*3/uL — ABNORMAL HIGH (ref 0.00–0.07)
Basophils Absolute: 0 10*3/uL (ref 0.0–0.1)
Basophils Relative: 0 %
Eosinophils Absolute: 0.3 10*3/uL (ref 0.0–0.5)
Eosinophils Relative: 4 %
HCT: 33.3 % — ABNORMAL LOW (ref 39.0–52.0)
Hemoglobin: 10 g/dL — ABNORMAL LOW (ref 13.0–17.0)
Immature Granulocytes: 1 %
Lymphocytes Relative: 22 %
Lymphs Abs: 2.1 10*3/uL (ref 0.7–4.0)
MCH: 26.8 pg (ref 26.0–34.0)
MCHC: 30 g/dL (ref 30.0–36.0)
MCV: 89.3 fL (ref 80.0–100.0)
Monocytes Absolute: 0.8 10*3/uL (ref 0.1–1.0)
Monocytes Relative: 8 %
Neutro Abs: 6.2 10*3/uL (ref 1.7–7.7)
Neutrophils Relative %: 65 %
Platelets: 304 10*3/uL (ref 150–400)
RBC: 3.73 MIL/uL — ABNORMAL LOW (ref 4.22–5.81)
RDW: 17.3 % — ABNORMAL HIGH (ref 11.5–15.5)
WBC: 9.5 10*3/uL (ref 4.0–10.5)
nRBC: 0 % (ref 0.0–0.2)

## 2024-06-20 MED ORDER — SODIUM CHLORIDE 0.9 % IV SOLN: INTRAVENOUS | Status: DC

## 2024-06-20 MED ORDER — METHYLPREDNISOLONE SODIUM SUCC 125 MG IJ SOLR
125.0000 mg | Freq: Once | INTRAMUSCULAR | Status: AC
  Administered 2024-06-20: 125 mg via INTRAVENOUS
  Filled 2024-06-20: qty 2

## 2024-06-20 MED ORDER — ACETAMINOPHEN 325 MG PO TABS
650.0000 mg | ORAL_TABLET | Freq: Once | ORAL | Status: AC
  Administered 2024-06-20: 650 mg via ORAL
  Filled 2024-06-20: qty 2

## 2024-06-20 MED ORDER — DIPHENHYDRAMINE HCL 50 MG/ML IJ SOLN
25.0000 mg | Freq: Once | INTRAMUSCULAR | Status: AC
  Administered 2024-06-20: 25 mg via INTRAVENOUS
  Filled 2024-06-20: qty 1

## 2024-06-20 MED ORDER — FAMOTIDINE IN NACL 20-0.9 MG/50ML-% IV SOLN
20.0000 mg | Freq: Once | INTRAVENOUS | Status: AC
  Administered 2024-06-20: 20 mg via INTRAVENOUS
  Filled 2024-06-20: qty 50

## 2024-06-20 MED ORDER — SODIUM CHLORIDE 0.9 % IV SOLN
375.0000 mg/m2 | Freq: Once | INTRAVENOUS | Status: AC
  Administered 2024-06-20: 800 mg via INTRAVENOUS
  Filled 2024-06-20: qty 50

## 2024-06-20 NOTE — Patient Instructions (Addendum)
 [facility] - A DEPT OF [facility]  Discharge Instructions: Thank you for choosing [facility] to provide your oncology and hematology care.  If you have a lab appointment with the Cancer Center - please note that after April 8th, 2024, all labs will be drawn in the cancer center.  You do not have to check in or register with the main entrance as you have in the past but will complete your check-in in the cancer center.  Wear comfortable clothing and clothing appropriate for easy access to any Portacath or PICC line.   We strive to give you quality time with your provider. You may need to reschedule your appointment if you arrive late (15 or more minutes).  Arriving late affects you and other patients whose appointments are after yours.  Also, if you miss three or more appointments without notifying the office, you may be dismissed from the clinic at the providers discretion.      For prescription refill requests, have your pharmacy contact our office and allow 72 hours for refills to be completed.    Today you received the following chemotherapy and/or immunotherapy agents Ruxience . Rituximab  Injection What is this medication? RITUXIMAB  (ri TUX i mab) treats leukemia and lymphoma. It works by blocking a protein that causes cancer cells to grow and multiply. This helps to slow or stop the spread of cancer cells. It may also be used to treat autoimmune conditions, such as arthritis. It works by slowing down an overactive immune system. It is a monoclonal antibody. This medicine may be used for other purposes; ask your health care provider or pharmacist if you have questions. COMMON BRAND NAME(S): RIABNI , Rituxan , RUXIENCE , truxima  What should I tell my care team before I take this medication? They need to know if you have any of these conditions: Chest pain Heart disease Immune system problems Infection, such as chickenpox, cold sores, hepatitis B,  herpes Irregular heartbeat or rhythm Kidney disease Low blood counts, such as low white cells, platelets, red cells Lung disease Recent or upcoming vaccine An unusual or allergic reaction to rituximab , other medications, foods, dyes, or preservatives Pregnant or trying to get pregnant Breast-feeding How should I use this medication? This medication is injected into a vein. It is given by a care team in a hospital or clinic setting. A special MedGuide will be given to you before each treatment. Be sure to read this information carefully each time. Talk to your care team about the use of this medication in children. While this medication may be prescribed for children as young as 6 months for selected conditions, precautions do apply. Overdosage: If you think you have taken too much of this medicine contact a poison control center or emergency room at once. NOTE: This medicine is only for you. Do not share this medicine with others. What if I miss a dose? Keep appointments for follow-up doses. It is important not to miss your dose. Call your care team if you are unable to keep an appointment. What may interact with this medication? Do not take this medication with any of the following: Live vaccines This medication may also interact with the following: Cisplatin This list may not describe all possible interactions. Give your health care provider a list of all the medicines, herbs, non-prescription drugs, or dietary supplements you use. Also tell them if you smoke, drink alcohol, or use illegal drugs. Some items may interact with your medicine. What  should I watch for while using this medication? Your condition will be monitored carefully while you are receiving this medication. You may need blood work while taking this medication. This medication can cause serious infusion reactions. To reduce the risk your care team may give you other medications to take before receiving this one. Be sure to  follow the directions from your care team. This medication may increase your risk of getting an infection. Call your care team for advice if you get a fever, chills, sore throat, or other symptoms of a cold or flu. Do not treat yourself. Try to avoid being around people who are sick. Call your care team if you are around anyone with measles, chickenpox, or if you develop sores or blisters that do not heal properly. Avoid taking medications that contain aspirin , acetaminophen , ibuprofen , naproxen, or ketoprofen unless instructed by your care team. These medications may hide a fever. This medication may cause serious skin reactions. They can happen weeks to months after starting the medication. Contact your care team right away if you notice fevers or flu-like symptoms with a rash. The rash may be red or purple and then turn into blisters or peeling of the skin. You may also notice a red rash with swelling of the face, lips, or lymph nodes in your neck or under your arms. In some patients, this medication may cause a serious brain infection that may cause death. If you have any problems seeing, thinking, speaking, walking, or standing, tell your care team right away. If you cannot reach your care team, urgently seek another source of medical care. Talk to your care team if you may be pregnant. Serious birth defects can occur if you take this medication during pregnancy and for 12 months after the last dose. You will need a negative pregnancy test before starting this medication. Contraception is recommended while taking this medication and for 12 months after the last dose. Your care team can help you find the option that works for you. Do not breastfeed while taking this medication and for at least 6 months after the last dose. What side effects may I notice from receiving this medication? Side effects that you should report to your care team as soon as possible: Allergic reactions or angioedema--skin rash,  itching or hives, swelling of the face, eyes, lips, tongue, arms, or legs, trouble swallowing or breathing Bowel blockage--stomach cramping, unable to have a bowel movement or pass gas, loss of appetite, vomiting Dizziness, loss of balance or coordination, confusion or trouble speaking Heart attack--pain or tightness in the chest, shoulders, arms, or jaw, nausea, shortness of breath, cold or clammy skin, feeling faint or lightheaded Heart rhythm changes--fast or irregular heartbeat, dizziness, feeling faint or lightheaded, chest pain, trouble breathing Infection--fever, chills, cough, sore throat, wounds that don't heal, pain or trouble when passing urine, general feeling of discomfort or being unwell Infusion reactions--chest pain, shortness of breath or trouble breathing, feeling faint or lightheaded Kidney injury--decrease in the amount of urine, swelling of the ankles, hands, or feet Liver injury--right upper belly pain, loss of appetite, nausea, light-colored stool, dark yellow or brown urine, yellowing skin or eyes, unusual weakness or fatigue Redness, blistering, peeling, or loosening of the skin, including inside the mouth Stomach pain that is severe, does not go away, or gets worse Tumor lysis syndrome (TLS)--nausea, vomiting, diarrhea, decrease in the amount of urine, dark urine, unusual weakness or fatigue, confusion, muscle pain or cramps, fast or irregular heartbeat, joint pain Side  effects that usually do not require medical attention (report to your care team if they continue or are bothersome): Headache Joint pain Nausea Runny or stuffy nose Unusual weakness or fatigue This list may not describe all possible side effects. Call your doctor for medical advice about side effects. You may report side effects to FDA at 1-800-FDA-1088. Where should I keep my medication? This medication is given in a hospital or clinic. It will not be stored at home. NOTE: This sheet is a summary. It  may not cover all possible information. If you have questions about this medicine, talk to your doctor, pharmacist, or health care provider.  2024 Elsevier/Gold Standard (2021-09-23 00:00:00)      To help prevent nausea and vomiting after your treatment, we encourage you to take your nausea medication as directed.  BELOW ARE SYMPTOMS THAT SHOULD BE REPORTED IMMEDIATELY: *FEVER GREATER THAN 100.4 F (38 C) OR HIGHER *CHILLS OR SWEATING *NAUSEA AND VOMITING THAT IS NOT CONTROLLED WITH YOUR NAUSEA MEDICATION *UNUSUAL SHORTNESS OF BREATH *UNUSUAL BRUISING OR BLEEDING *URINARY PROBLEMS (pain or burning when urinating, or frequent urination) *BOWEL PROBLEMS (unusual diarrhea, constipation, pain near the anus) TENDERNESS IN MOUTH AND THROAT WITH OR WITHOUT PRESENCE OF ULCERS (sore throat, sores in mouth, or a toothache) UNUSUAL RASH, SWELLING OR PAIN  UNUSUAL VAGINAL DISCHARGE OR ITCHING   Items with * indicate a potential emergency and should be followed up as soon as possible or go to the Emergency Department if any problems should occur.  Please show the CHEMOTHERAPY ALERT CARD or IMMUNOTHERAPY ALERT CARD at check-in to the Emergency Department and triage nurse.  Should you have questions after your visit or need to cancel or reschedule your appointment, please contact [facility] [place] - A DEPT OF [facility] [phone]  and follow the prompts.  Office hours are 8:00 a.m. to 4:30 p.m. Monday - Friday. Please note that voicemails left after 4:00 p.m. may not be returned until the following business day.  We are closed weekends and major holidays. You have access to a nurse at all times for urgent questions. Please call the main number to the clinic [phone] and follow the prompts.  For any non-urgent questions, you may also contact your provider using MyChart. We now offer e-Visits for anyone 49 and older to request care online for non-urgent symptoms. For details  visit [email or web address].   Also download the MyChart app! Go to the app store, search MyChart, open the app, select [facility], and log in with your MyChart username and password.

## 2024-06-20 NOTE — Progress Notes (Signed)
 Patient tolerated therapy with no complaints voiced. Side effects with manaIV site clean and dry with no bruising or swelling noted at site. Good blood return noted before and after administration of therapy. Band aid applied. Patient left in satisfactory condition with VSS and no s/s of distress noted.

## 2024-06-20 NOTE — Progress Notes (Signed)
 Patient presents today for chemotherapy/immunotherpay infusion of Ruxience .  Patient is in satisfactory condition with no new complaints voiced.  Patient does report having a drain placed in right upper abd yesterday. Dr Davonna aware and is tto see patient prior to treatment. Vital signs are stable.  Labs reviewed and all labs are within treatment parameters.   Patient assessed by Dr Davonna. Patient may have treatment today and appointment for office visit is to be made prior to future treatment.

## 2024-06-21 ENCOUNTER — Encounter: Payer: Self-pay | Admitting: Internal Medicine

## 2024-06-21 ENCOUNTER — Ambulatory Visit: Admitting: Internal Medicine

## 2024-06-21 VITALS — BP 139/66 | HR 108 | Ht 68.0 in | Wt 174.0 lb

## 2024-06-21 DIAGNOSIS — J90 Pleural effusion, not elsewhere classified: Secondary | ICD-10-CM | POA: Insufficient documentation

## 2024-06-21 NOTE — Progress Notes (Unsigned)
 "   Cody Palmer, Cody Palmer    DOB: [birth year]    MRN: [id number]   Brief patient profile:  72  yowm  quit smoking 1966  referred to pulmonary clinic in [place]  06/21/2024 by Dr Pearlean  for R pleural effusion s/p pleurex 06/20/24    Pt not previously seen by PCCM service.     Admission date:  06/02/2024  Admitting Physician  Pratik JONETTA Fairly, DO   Discharge Date:  06/07/2024    Primary MD  [name], NP   Recommendations for primary care physician for things to follow:  1)Very Low-salt diet advised---Less than 2 gm of Sodium per day advised----ok to use Mrs DASH salt substitute instead of Salt 2)Weigh yourself daily, call if you gain more than 3 pounds in 1 day or more than 5 pounds in 1 week as your diuretic medications may need to be adjusted 3)Avoid ibuprofen /Advil /Aleve/Motrin Josefine Powders/Naproxen/BC powders/Meloxicam /Diclofenac/Indomethacin and other Nonsteroidal anti-inflammatory medications as these will make you more likely to bleed and can cause stomach ulcers, can also cause Kidney problems.  4)Interventional radiology team will reach out to you to schedule PleurX catheter procedure for next week 5) please keep your appointment with your oncologist Dr. Davonna 6) outpatient follow-up with pulmonologist Dr. Ozell America advised   Admission Diagnosis  Pleural effusion [J90] Malignant ascites (HCC) [R18.0] Acute hypoxemic respiratory failure (HCC) [J96.01] History of follicular lymphoma [Z85.72] Pneumonia of right lung due to infectious organism, unspecified part of lung [J18.9] Sepsis, due to unspecified organism, unspecified whether acute organ dysfunction present (HCC) [A41.9] Acute hypoxic respiratory failure (HCC) [J96.01]     Discharge Diagnosis  Pleural effusion [J90] Malignant ascites (HCC) [R18.0] Acute hypoxemic respiratory failure (HCC) [J96.01] History of follicular lymphoma [Z85.72] Pneumonia of right lung due to infectious organism, unspecified  part of lung [J18.9] Sepsis, due to unspecified organism, unspecified whether acute organ dysfunction present (HCC) [A41.9] Acute hypoxic respiratory failure (HCC) [J96.01]    Principal Problem:   Acute hypoxemic respiratory failure (HCC) Active Problems:   Essential hypertension   Abdominal distention   DNR (do not resuscitate)   Follicular lymphoma (HCC)   CKD stage 3 due to type 2 diabetes mellitus (HCC)   Protein-calorie malnutrition, severe           Past Medical History:  Diagnosis Date   Arthritis     Cancer (HCC)      Skin cancer   Diabetes mellitus without complication (HCC)     HTN (hypertension)     Hypercholesteremia               Past Surgical History:  Procedure Laterality Date   CATARACT EXTRACTION W/PHACO Left 04/25/2016    Procedure: CATARACT EXTRACTION PHACO AND INTRAOCULAR LENS PLACEMENT LEFT EYE;  Surgeon: Cherene Mania, MD;  Location: [facility];  Service: Ophthalmology;  Laterality: Left;  CDE: 10.37   COLONOSCOPY N/A 04/05/2024    Procedure: COLONOSCOPY;  Surgeon: Cindie Carlin POUR, DO;  Location: [facility];  Service: Endoscopy;  Laterality: N/A;   CYSTOSCOPY WITH BIOPSY N/A 08/13/2013    Procedure: CYSTOSCOPY WITH BLADDER BIOPSY;  Surgeon: Garnette Shack, MD;  Location: [facility];  Service: Urology;  Laterality: N/A;   ENUCLEATION Right      Removed in 1958   EYE SURGERY   1958    removal of eye   TOTAL KNEE ARTHROPLASTY Right 01/11/2013    Procedure: RIGHT TOTAL KNEE ARTHROPLASTY;  Surgeon: Taft FORBES Minerva, MD;  Location: [facility];  Service: Orthopedics;  Laterality: Right;   TOTAL KNEE ARTHROPLASTY Left 07/12/2022    Procedure: TOTAL KNEE ARTHROPLASTY;  Surgeon: Margrette Taft BRAVO, MD;  Location: [facility];  Service: Orthopedics;  Laterality: Left;           HPI  from the history and physical done on the day of admission:    Patient coming from: Home   Chief Complaint: Shortness of breath   HPI: Cody Palmer is a 89 y.o. Cody Palmer with medical  history significant for CKD stage IIIa, hypertension, dyslipidemia, type 2 diabetes, and non-Hodgkin's lymphoma who presented to the ED with worsening shortness of breath over the last few days.  He would routinely get paracentesis done, but has not required 1 in the last 3 weeks.  He has noticed some leg swelling as well as some mild abdominal distention and has increased cough and fatigue.  He normally does not wear oxygen at home and denies any other symptoms of fevers, chills, abdominal pain, chest pain, or sick contacts.   ED Course: Vital signs with tachycardia and noted tachypnea, but able to speak in complete sentences and does not appear to be in significant distress.  Currently on 2 L nasal cannula oxygen given hypoxemia with 89%.  Leukocytosis of 19,200 with hemoglobin 12.4 and lactic acid of 2.5 after initial noted to be 3.9.  BNP 933.  Patient noted to have significant right sided pleural effusion with atelectasis as well as small to moderate ascites in the setting of his lymphoma.  He was noted to be 89% on room air and started on nasal cannula oxygen.  He has been started on IV Lasix  for diuresis as well as azithromycin  and Rocephin  due to concerns for pneumonia.   Review of Systems: Reviewed as noted above, otherwise negative.    Hospital Course:    Assessment and Plan: 1)Acute hypoxic  respiratory failure secondary to large right pleural effusion with atelectasis --Etiology of rather large right-sided pleural effusion could be lymphoma versus pneumonia versus heart failure -Patient had Rt Thora on 06/03/2024 with 1.5 L removed and again Thora on the right side on 06/05/2024 with 2.9 L removed and again on 06/07/2024 with 1.7 L removed --Repeat x-ray on 06/08/23 continues to show residual pleural effusion on the right -Hypoxia resolved---currently on Room Air-post ambulation O2 sats 91 to 92% on room air -Discussed with patient and family members that he may be a candidate for PleurX  catheter --They want to proceed with Pleurax cath --Pleural fluid LDH and pleural fluid protein pending to help calculate lights criteria for possible exudate versus transudate----suspect this is an exudate most likely due to lymphoma -Patient did well with IV Lasix  okay to discharge home on oral list --- IR consult for thoracentesis and PleurX catheter placement requested --Per IR team patient will be contacted for PleurX catheter placement the week of 06/10/2024, patient's oncologist Dr. Davonna with hip order supplies for patient's PleurX catheter change--- Home health RN can help with PleurX catheter management   2)Sepsis, POA secondary to Pneumonia -Chest imaging with mostly right-sided pleural effusion unable to exclude possible underlying pneumonia at this time Respiratory status has improved significantly-currently on room air WBC 19.2 >>14.7 >>13.3 >>10.4 - Treated Azithromycin  and Rocephin --dc home on Ceftin  and Azithromycin   - Gram stain with no organisms and culture with no growth from thoracentesis on 1/19, repeat thoracentesis on 06/05/24 and again on 06/07/24    3)Non-Hodgkin lymphoma -PET scan results from 05/30/2021 showed significant improvement -  Follows with Dr. Davonna    4)Recurring Ascites likely secondary to lymphoma - Small to moderate on abdominal imaging with no tense abdomen or signs of SBP - PRN  paracentesis as outpatient   CKD stage IIIb - Current creatinine near baseline - renally adjust medications, avoid nephrotoxic agents / dehydration  / hypotension   Hypertension - Currently stable, discharged home on Toprol -XL especially given finding of tachycardia   Type 2 diabetes--A1c 6.1 reflective of good diabetic control PTA - Continue metformin  and Jardiance    Sinus tachycardia - Likely ongoing in the setting of pleural effusion  -Toprol -XL as above   Generalized Weakness/ Ambulatory dysfunction--- PT eval appreciated recommends home health PT    Code  Status: DNR Family Communication: Discussed with daughter [name] at bedside .   Consultants:  Interventional radiologist   Procedures:  Right sided thoracentesis 1/19, 06/05/24 and again on 06/07/2024   History of Present Illness  06/21/2024  Pulmonary/ 1st office eval/ [facility] / [place] [facility]  Chief Complaint  Patient presents with   Establish Care    Fluid on right lungs    Dyspnea:  s/p 500 off 06/20/24 walks inside house ok with cane / not getting out much 75 ft flat to MB avoiding now due to house / not played golf yet or started shopping until Nov 2025  Cough: none  Sleep: bed is flat with 2 pillows  SABA use: none  02: none     No obvious day to day or daytime pattern/variability or assoc excess/ purulent sputum or mucus plugs or hemoptysis or cp or chest tightness, subjective wheeze or overt sinus or hb symptoms.    Also denies any obvious fluctuation of symptoms with weather or environmental changes or other aggravating or alleviating factors except as outlined above   No unusual exposure hx or h/o childhood pna/ asthma or knowledge of premature birth.  Current Allergies, Complete Past Medical History, Past Surgical History, Family History, and Social History were reviewed in [employer] record.  ROS  The following are not active complaints unless bolded Hoarseness, sore throat, dysphagia, dental problems, itching, sneezing,  nasal congestion or discharge of excess mucus or purulent secretions, ear ache,   fever, chills, sweats, unintended wt loss or wt gain, classically pleuritic or exertional cp,  orthopnea pnd or arm/hand swelling  or leg swelling, presyncope, palpitations, abdominal pain, anorexia, nausea, vomiting, diarrhea  or change in bowel habits or change in bladder habits, change in stools or change in urine, dysuria, hematuria,  rash, arthralgias, visual complaints, headache, numbness, weakness or ataxia or problems with walking or coordination,   change in mood or  memory.            Outpatient Medications Prior to Visit  Medication Sig Dispense Refill   acetaminophen  (TYLENOL ) 325 MG tablet Take 2 tablets (650 mg total) by mouth every 6 (six) hours as needed for mild pain (pain score 1-3), fever or headache (or Fever >/= 101).     allopurinol  (ZYLOPRIM ) 300 MG tablet Take 0.5 tablets (150 mg total) by mouth daily. 15 tablet 0   aspirin  EC 81 MG tablet Take 1 tablet (81 mg total) by mouth daily with breakfast. Swallow whole. 30 tablet 12   Cholecalciferol  (VITAMIN D3) 25 MCG (1000 UT) CAPS Take 1,000 Units by mouth daily.     fluticasone  (FLONASE ) 50 MCG/ACT nasal spray Place 2 sprays into both nostrils daily.     furosemide  (LASIX ) 40 MG tablet Take 1 tablet (  40 mg total) by mouth in the morning. 30 tablet 2   JARDIANCE  25 MG TABS tablet Take 25 mg by mouth daily.     loratadine  (CLARITIN ) 10 MG tablet Take 1 tablet (10 mg total) by mouth daily.     metformin  (FORTAMET ) 500 MG (OSM) 24 hr tablet Take 1 tablet (500 mg total) by mouth 2 (two) times daily with a meal. 60 tablet 1   metoprolol  succinate (TOPROL  XL) 25 MG 24 hr tablet Take 1 tablet (25 mg total) by mouth daily. 30 tablet 2   Multiple Vitamins-Minerals (EYE HEALTH AREDS 2 PO) Take 1 capsule by mouth 2 (two) times daily.     omeprazole  (PRILOSEC) 40 MG capsule Take 1 capsule (40 mg total) by mouth daily. 30 capsule 2   prochlorperazine  (COMPAZINE ) 10 MG tablet Take 1 tablet (10 mg total) by mouth every 6 (six) hours as needed for nausea or vomiting. 30 tablet 0   rosuvastatin  (CRESTOR ) 10 MG tablet Take 1 tablet (10 mg total) by mouth every other day. 30 tablet 0   simethicone  (MYLICON) 80 MG chewable tablet Chew 1 tablet (80 mg total) by mouth every 6 (six) hours as needed for flatulence. 30 tablet 0   Facility-Administered Medications Prior to Visit  Medication Dose Route Frequency Provider Last Rate Last Admin   ceFAZolin  (ANCEF ) 2 g in dextrose  5 % 100 mL IVPB  2 g  Intravenous Q8H Kaltenbacher, Leanor PARAS, PA-C        Past Medical History:  Diagnosis Date   Allergy    Arthritis    Cancer (HCC)    Skin cancer   Diabetes mellitus without complication (HCC)    HTN (hypertension)    Hypercholesteremia    Skin cancer       Objective:     BP 139/66   Pulse (!) 108   Ht 5' 8 (1.727 m)   Wt 174 lb (78.9 kg)   SpO2 97% Comment: ra  BMI 26.46 kg/m   SpO2: 97 % (ra) slt hoarse amb wm nad /walks with cane    HEENT : Oropharynx  clear  - two partial dentures     Nasal turbinates nl    NECK :  without  apparent JVD/ palpable Nodes/TM    LUNGS: no acc muscle use,  Nl contour chest which is clear to A and P bilaterally without cough on insp or exp maneuvers   CV:  RRR  no s3 or murmur or increase in P2, and 1+ R / trace LLE pitting edema/   ABD:  soft and nontender   MS:   ext warm without deformities Or obvious joint restrictions  calf tenderness, cyanosis or clubbing    SKIN: warm and dry without lesions    NEURO:  alert, approp, nl sensorium with  no motor or cerebellar deficits apparent.       Assessment       "

## 2024-06-21 NOTE — Patient Instructions (Addendum)
 Make sure you check your oxygen saturation at your highest level of activity (NOT after you stop)  to be sure it stays over 90% and keep track of it at least once a week, more often if breathing getting worse, and let me know if losing ground. (Collect the dots to connect the dots approach)     Pulmonary follow up is as needed at this point for worsening breathing, exercise 02 saturations or inability to lie flat even after the fluid is removed from your chest

## 2024-06-26 ENCOUNTER — Ambulatory Visit: Admitting: Orthopedic Surgery

## 2024-06-26 DIAGNOSIS — Z96652 Presence of left artificial knee joint: Secondary | ICD-10-CM

## 2024-06-26 DIAGNOSIS — Z96651 Presence of right artificial knee joint: Secondary | ICD-10-CM

## 2024-06-28 ENCOUNTER — Ambulatory Visit: Payer: Medicare Other | Admitting: Orthopedic Surgery

## 2024-07-22 ENCOUNTER — Inpatient Hospital Stay: Admitting: Oncology

## 2024-07-22 ENCOUNTER — Inpatient Hospital Stay

## 2024-07-22 ENCOUNTER — Inpatient Hospital Stay: Attending: Oncology
